# Patient Record
Sex: Female | Born: 1965
Health system: Southern US, Community
[De-identification: ages and names within clinical notes are randomized; demographics above are authoritative.]

## PROBLEM LIST (undated history)

## (undated) ENCOUNTER — Ambulatory Visit: Source: Home / Self Care

## (undated) DIAGNOSIS — R7303 Prediabetes: Secondary | ICD-10-CM

## (undated) DIAGNOSIS — E7800 Pure hypercholesterolemia, unspecified: Secondary | ICD-10-CM

## (undated) DIAGNOSIS — E119 Type 2 diabetes mellitus without complications: Secondary | ICD-10-CM

## (undated) DIAGNOSIS — G8929 Other chronic pain: Secondary | ICD-10-CM

## (undated) HISTORY — PX: ABDOMINAL HYSTERECTOMY: SHX81

## (undated) HISTORY — PX: BACK SURGERY: SHX140

---

## 2014-08-29 ENCOUNTER — Emergency Department (HOSPITAL_COMMUNITY)
Admission: EM | Admit: 2014-08-29 | Discharge: 2014-08-29 | Disposition: A | Discharge status: Home / Self Care | Payer: Medicaid Other | Attending: Emergency Medicine | Admitting: Emergency Medicine

## 2014-08-29 ENCOUNTER — Emergency Department (HOSPITAL_COMMUNITY): Payer: Medicaid Other

## 2014-08-29 ENCOUNTER — Encounter (HOSPITAL_COMMUNITY): Payer: Self-pay | Admitting: *Deleted

## 2014-08-29 DIAGNOSIS — Z79899 Other long term (current) drug therapy: Secondary | ICD-10-CM | POA: Diagnosis not present

## 2014-08-29 DIAGNOSIS — R0789 Other chest pain: Secondary | ICD-10-CM | POA: Insufficient documentation

## 2014-08-29 DIAGNOSIS — M542 Cervicalgia: Secondary | ICD-10-CM | POA: Diagnosis not present

## 2014-08-29 DIAGNOSIS — Z3202 Encounter for pregnancy test, result negative: Secondary | ICD-10-CM | POA: Diagnosis not present

## 2014-08-29 DIAGNOSIS — R519 Headache, unspecified: Secondary | ICD-10-CM

## 2014-08-29 DIAGNOSIS — M25512 Pain in left shoulder: Secondary | ICD-10-CM | POA: Insufficient documentation

## 2014-08-29 DIAGNOSIS — Z8639 Personal history of other endocrine, nutritional and metabolic disease: Secondary | ICD-10-CM | POA: Diagnosis not present

## 2014-08-29 DIAGNOSIS — R51 Headache: Secondary | ICD-10-CM | POA: Insufficient documentation

## 2014-08-29 DIAGNOSIS — R079 Chest pain, unspecified: Secondary | ICD-10-CM | POA: Diagnosis present

## 2014-08-29 HISTORY — DX: Prediabetes: R73.03

## 2014-08-29 LAB — CBC
HEMATOCRIT: 43.5 % (ref 36.0–46.0)
Hemoglobin: 13.9 g/dL (ref 12.0–15.0)
MCH: 29.9 pg (ref 26.0–34.0)
MCHC: 32 g/dL (ref 30.0–36.0)
MCV: 93.5 fL (ref 78.0–100.0)
Platelets: 277 10*3/uL (ref 150–400)
RBC: 4.65 MIL/uL (ref 3.87–5.11)
RDW: 12.1 % (ref 11.5–15.5)
WBC: 7.2 10*3/uL (ref 4.0–10.5)

## 2014-08-29 LAB — BASIC METABOLIC PANEL
Anion gap: 14 (ref 5–15)
BUN: 6 mg/dL (ref 6–23)
CALCIUM: 9.7 mg/dL (ref 8.4–10.5)
CHLORIDE: 103 mmol/L (ref 96–112)
CO2: 20 mmol/L (ref 19–32)
Creatinine, Ser: 0.74 mg/dL (ref 0.50–1.10)
Glucose, Bld: 162 mg/dL — ABNORMAL HIGH (ref 70–99)
Potassium: 3.7 mmol/L (ref 3.5–5.1)
Sodium: 137 mmol/L (ref 135–145)

## 2014-08-29 LAB — POC URINE PREG, ED: PREG TEST UR: NEGATIVE

## 2014-08-29 LAB — I-STAT TROPONIN, ED: TROPONIN I, POC: 0 ng/mL (ref 0.00–0.08)

## 2014-08-29 MED ORDER — DIAZEPAM 5 MG PO TABS
5.0000 mg | ORAL_TABLET | Freq: Once | ORAL | Status: AC
  Administered 2014-08-29: 5 mg via ORAL
  Filled 2014-08-29: qty 1

## 2014-08-29 MED ORDER — DIAZEPAM 5 MG PO TABS: 5.0000 mg | ORAL_TABLET | Freq: Four times a day (QID) | ORAL | Status: DC | PRN

## 2014-08-29 MED ORDER — IBUPROFEN 600 MG PO TABS: 600.0000 mg | ORAL_TABLET | Freq: Four times a day (QID) | ORAL | Status: DC | PRN

## 2014-08-29 NOTE — ED Notes (Signed)
Pt c/o migraine that started 3-4 hours ago. States she recently started experiencing chest pain that is radiating to her back, shoulder and neck. Has not taken anything for the pain.

## 2014-08-29 NOTE — Discharge Instructions (Signed)
Cervical Sprain °A cervical sprain is an injury in the neck in which the strong, fibrous tissues (ligaments) that connect your neck bones stretch or tear. Cervical sprains can range from mild to severe. Severe cervical sprains can cause the neck vertebrae to be unstable. This can lead to damage of the spinal cord and can result in serious nervous system problems. The amount of time it takes for a cervical sprain to get better depends on the cause and extent of the injury. Most cervical sprains heal in 1 to 3 weeks. °CAUSES  °Severe cervical sprains may be caused by:  °· Contact sport injuries (such as from football, rugby, wrestling, hockey, auto racing, gymnastics, diving, martial arts, or boxing).   °· Motor vehicle collisions.   °· Whiplash injuries. This is an injury from a sudden forward and backward whipping movement of the head and neck.  °· Falls.   °Mild cervical sprains may be caused by:  °· Being in an awkward position, such as while cradling a telephone between your ear and shoulder.   °· Sitting in a chair that does not offer proper support.   °· Working at a poorly designed computer station.   °· Looking up or down for long periods of time.   °SYMPTOMS  °· Pain, soreness, stiffness, or a burning sensation in the front, back, or sides of the neck. This discomfort may develop immediately after the injury or slowly, 24 hours or more after the injury.   °· Pain or tenderness directly in the middle of the back of the neck.   °· Shoulder or upper back pain.   °· Limited ability to move the neck.   °· Headache.   °· Dizziness.   °· Weakness, numbness, or tingling in the hands or arms.   °· Muscle spasms.   °· Difficulty swallowing or chewing.   °· Tenderness and swelling of the neck.   °DIAGNOSIS  °Most of the time your health care provider can diagnose a cervical sprain by taking your history and doing a physical exam. Your health care provider will ask about previous neck injuries and any known neck  problems, such as arthritis in the neck. X-rays may be taken to find out if there are any other problems, such as with the bones of the neck. Other tests, such as a CT scan or MRI, may also be needed.  °TREATMENT  °Treatment depends on the severity of the cervical sprain. Mild sprains can be treated with rest, keeping the neck in place (immobilization), and pain medicines. Severe cervical sprains are immediately immobilized. Further treatment is done to help with pain, muscle spasms, and other symptoms and may include: °· Medicines, such as pain relievers, numbing medicines, or muscle relaxants.   °· Physical therapy. This may involve stretching exercises, strengthening exercises, and posture training. Exercises and improved posture can help stabilize the neck, strengthen muscles, and help stop symptoms from returning.   °HOME CARE INSTRUCTIONS  °· Put ice on the injured area.   °¨ Put ice in a plastic bag.   °¨ Place a towel between your skin and the bag.   °¨ Leave the ice on for 15-20 minutes, 3-4 times a day.   °· If your injury was severe, you may have been given a cervical collar to wear. A cervical collar is a two-piece collar designed to keep your neck from moving while it heals. °¨ Do not remove the collar unless instructed by your health care provider. °¨ If you have long hair, keep it outside of the collar. °¨ Ask your health care provider before making any adjustments to your collar. Minor   adjustments may be required over time to improve comfort and reduce pressure on your chin or on the back of your head. °¨ If you are allowed to remove the collar for cleaning or bathing, follow your health care provider's instructions on how to do so safely. °¨ Keep your collar clean by wiping it with mild soap and water and drying it completely. If the collar you have been given includes removable pads, remove them every 1-2 days and hand wash them with soap and water. Allow them to air dry. They should be completely  dry before you wear them in the collar. °¨ If you are allowed to remove the collar for cleaning and bathing, wash and dry the skin of your neck. Check your skin for irritation or sores. If you see any, tell your health care provider. °¨ Do not drive while wearing the collar.   °· Only take over-the-counter or prescription medicines for pain, discomfort, or fever as directed by your health care provider.   °· Keep all follow-up appointments as directed by your health care provider.   °· Keep all physical therapy appointments as directed by your health care provider.   °· Make any needed adjustments to your workstation to promote good posture.   °· Avoid positions and activities that make your symptoms worse.   °· Warm up and stretch before being active to help prevent problems.   °SEEK MEDICAL CARE IF:  °· Your pain is not controlled with medicine.   °· You are unable to decrease your pain medicine over time as planned.   °· Your activity level is not improving as expected.   °SEEK IMMEDIATE MEDICAL CARE IF:  °· You develop any bleeding. °· You develop stomach upset. °· You have signs of an allergic reaction to your medicine.   °· Your symptoms get worse.   °· You develop new, unexplained symptoms.   °· You have numbness, tingling, weakness, or paralysis in any part of your body.   °MAKE SURE YOU:  °· Understand these instructions. °· Will watch your condition. °· Will get help right away if you are not doing well or get worse. °Document Released: 03/15/2007 Document Revised: 05/23/2013 Document Reviewed: 11/23/2012 °Exit[employer]® Patient Information ©2015 [employer]. This information is not intended to replace advice given to you by your health care provider. Make sure you discuss any questions you have with your health care provider. ° °Chest Wall Pain °Chest wall pain is pain in or around the bones and muscles of your chest. It may take up to 6 weeks to get better. It may take longer if you must stay physically  active in your work and activities.  °CAUSES  °Chest wall pain may happen on its own. However, it may be caused by: °· A viral illness like the flu. °· Injury. °· Coughing. °· Exercise. °· Arthritis. °· Fibromyalgia. °· Shingles. °HOME CARE INSTRUCTIONS  °· Avoid overtiring physical activity. Try not to strain or perform activities that cause pain. This includes any activities using your chest or your abdominal and side muscles, especially if heavy weights are used. °· Put ice on the sore area. °¨ Put ice in a plastic bag. °¨ Place a towel between your skin and the bag. °¨ Leave the ice on for 15-20 minutes per hour while awake for the first 2 days. °· Only take over-the-counter or prescription medicines for pain, discomfort, or fever as directed by your caregiver. °SEEK IMMEDIATE MEDICAL CARE IF:  °· Your pain increases, or you are very uncomfortable. °· You have a fever. °· Your chest   pain becomes worse.  You have new, unexplained symptoms.  You have nausea or vomiting.  You feel sweaty or lightheaded.  You have a cough with phlegm (sputum), or you cough up blood. MAKE SURE YOU:   Understand these instructions.  Will watch your condition.  Will get help right away if you are not doing well or get worse. Document Released: 05/18/2005 Document Revised: 08/10/2011 Document Reviewed: 01/12/2011 [facility] Patient Information 2015 [place], [place]. This information is not intended to replace advice given to you by your health care provider. Make sure you discuss any questions you have with your health care provider.

## 2014-08-30 NOTE — ED Provider Notes (Signed)
CSN: [id number]     Arrival date & time 08/29/14  1945 History   First MD Initiated Contact with Patient 08/29/14 2017     Chief Complaint  Patient presents with  . Migraine  . Chest Pain     (Consider location/radiation/quality/duration/timing/severity/associated sxs/prior Treatment) Patient is a 49 y.o. female presenting with migraines and chest pain. The history is provided by the patient.  Migraine Associated symptoms include chest pain and headaches. Pertinent negatives include no abdominal pain.  Chest Pain Associated symptoms: headache and numbness   Associated symptoms: no abdominal pain, no back pain, no nausea, not vomiting and no weakness    patient presents with headache from her left eye back to her neck. Goes for neck down to her left arm with some numbness in her left hand. Also has some pain on her left chest going through to the back. Does have history of some headaches with her bulging disc in her neck. This is a typical headache for her. The pain on the neck is worse with movement and palpation of her shoulder. She states a specific spot she can push. No difficulty breathing. No recent trauma. No fevers. No recent travel. She does not smoke.  Past Medical History  Diagnosis Date  . Borderline diabetic    Past Surgical History  Procedure Laterality Date  . Abdominal hysterectomy     No family history on file. History  Substance Use Topics  . Smoking status: Never Smoker   . Smokeless tobacco: Never Used  . Alcohol Use: No   OB History    No data available     Review of Systems  Constitutional: Negative for activity change and appetite change.  Eyes: Negative for pain.  Respiratory: Negative for chest tightness.   Cardiovascular: Positive for chest pain. Negative for leg swelling.  Gastrointestinal: Negative for nausea, vomiting, abdominal pain and diarrhea.  Genitourinary: Negative for flank pain.  Musculoskeletal: Positive for neck pain. Negative for  back pain and neck stiffness.  Skin: Negative for rash.  Neurological: Positive for numbness and headaches. Negative for weakness.  Psychiatric/Behavioral: Negative for behavioral problems.      Allergies  Review of patient's allergies indicates no known allergies.  Home Medications   Prior to Admission medications   Medication Sig Start Date End Date Taking? Authorizing Provider  metFORMIN (GLUCOPHAGE) 500 MG tablet Take 500 mg by mouth 2 (two) times daily with a meal.   Yes Historical Provider, MD  diazepam (VALIUM) 5 MG tablet Take 1 tablet (5 mg total) by mouth every 6 (six) hours as needed for muscle spasms. 08/29/14   Benjiman Core, MD  ibuprofen (ADVIL,MOTRIN) 600 MG tablet Take 1 tablet (600 mg total) by mouth every 6 (six) hours as needed. 08/29/14   Benjiman Core, MD   BP 142/71 mmHg  Pulse 75  Temp(Src) 97.7 F (36.5 C) (Oral)  Resp 22  Ht  (1.6 m)  Wt 160 lb (72.576 kg)  BMI 28.35 kg/m2  SpO2 99% Physical Exam  Constitutional: She is oriented to person, place, and time. She appears well-developed and well-nourished.  HENT:  Head: Normocephalic and atraumatic.  Eyes: EOM are normal. Pupils are equal, round, and reactive to light.  Neck: Normal range of motion. Neck supple.  Tenderness over trapezius on left side. Palpation of trapezius shoots pain down her left arm.  Cardiovascular: Normal rate, regular rhythm and normal heart sounds.   No murmur heard. Pulmonary/Chest: Effort normal and breath sounds normal. No respiratory  distress. She has no wheezes. She has no rales. She exhibits tenderness.  Abdominal: Soft. Bowel sounds are normal. She exhibits no distension. There is no tenderness. There is no rebound and no guarding.  Musculoskeletal: Normal range of motion.  Tenderness over left trapezius.  Neurological: She is alert and oriented to person, place, and time. No cranial nerve deficit.  Neuromuscular intact over left hand over radial median and  ulnar distribution.  Skin: Skin is warm and dry.  Psychiatric: She has a normal mood and affect. Her speech is normal.  Nursing note and vitals reviewed.   ED Course  Procedures (including critical care time) Labs Review Labs Reviewed  BASIC METABOLIC PANEL - Abnormal; Notable for the following:    Glucose, Bld 162 (*)    All other components within normal limits  CBC  I-STAT TROPOININ, ED  POC URINE PREG, ED    Imaging Review Dg Chest 1 View  08/29/2014   CLINICAL DATA:  Left facial pain and arm pain last 2 hours.  EXAM: CHEST  1 VIEW  COMPARISON:  None.  FINDINGS: Midline trachea.  Normal heart size and mediastinal contours.  Sharp costophrenic angles.  No pneumothorax.  Clear lungs.  IMPRESSION: No active disease.   Electronically Signed   By: Jeronimo GreavesKyle  Talbot M.D.   On: 08/29/2014 20:19     EKG Interpretation   Date/Time:  Wednesday August 29 2014 19:51:54 EDT Ventricular Rate:  86 PR Interval:  148 QRS Duration: 78 QT Interval:  336 QTC Calculation: 402 R Axis:   61 Text Interpretation:  Normal sinus rhythm Normal ECG Confirmed by  Rubin PayorPICKERING  MD, Harrold DonathNATHAN [phone]([id number]) on 08/29/2014 8:19:27 PM      MDM   Final diagnoses:  Nonintractable headache, unspecified chronicity pattern, unspecified headache type  Neck pain  Chest wall pain     patient with pain in her head neck chest and arm. Likely musculoskeletal from the neck. Has known bulging disks. Is not appear to be cardiac cause. Will discharge home.    Benjiman CoreNathan Rayola Everhart, MD 08/30/14 [phone]

## 2014-09-11 ENCOUNTER — Ambulatory Visit: Payer: Medicaid Other | Attending: Internal Medicine | Admitting: Internal Medicine

## 2014-09-11 ENCOUNTER — Encounter: Payer: Self-pay | Admitting: Internal Medicine

## 2014-09-11 VITALS — BP 133/85 | HR 74 | Temp 98.0°F | Resp 16 | Wt 177.0 lb

## 2014-09-11 DIAGNOSIS — M542 Cervicalgia: Secondary | ICD-10-CM | POA: Diagnosis not present

## 2014-09-11 DIAGNOSIS — M5416 Radiculopathy, lumbar region: Secondary | ICD-10-CM

## 2014-09-11 DIAGNOSIS — G8929 Other chronic pain: Secondary | ICD-10-CM

## 2014-09-11 DIAGNOSIS — M545 Low back pain, unspecified: Secondary | ICD-10-CM

## 2014-09-11 DIAGNOSIS — M5010 Cervical disc disorder with radiculopathy, unspecified cervical region: Secondary | ICD-10-CM | POA: Diagnosis not present

## 2014-09-11 DIAGNOSIS — R7309 Other abnormal glucose: Secondary | ICD-10-CM

## 2014-09-11 DIAGNOSIS — R7303 Prediabetes: Secondary | ICD-10-CM

## 2014-09-11 LAB — GLUCOSE, POCT (MANUAL RESULT ENTRY): POC GLUCOSE: 164 mg/dL — AB (ref 70–99)

## 2014-09-11 LAB — POCT GLYCOSYLATED HEMOGLOBIN (HGB A1C): Hemoglobin A1C: 6.4

## 2014-09-11 MED ORDER — GABAPENTIN 300 MG PO CAPS: 300.0000 mg | ORAL_CAPSULE | Freq: Three times a day (TID) | ORAL | Status: DC

## 2014-09-11 NOTE — Patient Instructions (Signed)
DASH Eating Plan °DASH stands for "Dietary Approaches to Stop Hypertension." The DASH eating plan is a healthy eating plan that has been shown to reduce high blood pressure (hypertension). Additional health benefits may include reducing the risk of type 2 diabetes mellitus, heart disease, and stroke. The DASH eating plan may also help with weight loss. °WHAT DO I NEED TO KNOW ABOUT THE DASH EATING PLAN? °For the DASH eating plan, you will follow these general guidelines: °· Choose foods with a percent daily value for sodium of less than 5% (as listed on the food label). °· Use salt-free seasonings or herbs instead of table salt or sea salt. °· Check with your health care provider or pharmacist before using salt substitutes. °· Eat lower-sodium products, often labeled as "lower sodium" or "no salt added." °· Eat fresh foods. °· Eat more vegetables, fruits, and low-fat dairy products. °· Choose whole grains. Look for the word "whole" as the first word in the ingredient list. °· Choose fish and skinless chicken or turkey more often than red meat. Limit fish, poultry, and meat to 6 oz (170 g) each day. °· Limit sweets, desserts, sugars, and sugary drinks. °· Choose heart-healthy fats. °· Limit cheese to 1 oz (28 g) per day. °· Eat more home-cooked food and less restaurant, buffet, and fast food. °· Limit fried foods. °· Cook foods using methods other than frying. °· Limit canned vegetables. If you do use them, rinse them well to decrease the sodium. °· When eating at a restaurant, ask that your food be prepared with less salt, or no salt if possible. °WHAT FOODS CAN I EAT? °Seek help from a dietitian for individual calorie needs. °Grains °Whole grain or whole wheat bread. Brown rice. Whole grain or whole wheat pasta. Quinoa, bulgur, and whole grain cereals. Low-sodium cereals. Corn or whole wheat flour tortillas. Whole grain cornbread. Whole grain crackers. Low-sodium crackers. °Vegetables °Fresh or frozen vegetables  (raw, steamed, roasted, or grilled). Low-sodium or reduced-sodium tomato and vegetable juices. Low-sodium or reduced-sodium tomato sauce and paste. Low-sodium or reduced-sodium canned vegetables.  °Fruits °All fresh, canned (in natural juice), or frozen fruits. °Meat and Other Protein Products °Ground beef (85% or leaner), grass-fed beef, or beef trimmed of fat. Skinless chicken or turkey. Ground chicken or turkey. Pork trimmed of fat. All fish and seafood. Eggs. Dried beans, peas, or lentils. Unsalted nuts and seeds. Unsalted canned beans. °Dairy °Low-fat dairy products, such as skim or 1% milk, 2% or reduced-fat cheeses, low-fat ricotta or cottage cheese, or plain low-fat yogurt. Low-sodium or reduced-sodium cheeses. °Fats and Oils °Tub margarines without trans fats. Light or reduced-fat mayonnaise and salad dressings (reduced sodium). Avocado. Safflower, olive, or canola oils. Natural peanut or almond butter. °Other °Unsalted popcorn and pretzels. °The items listed above may not be a complete list of recommended foods or beverages. Contact your dietitian for more options. °WHAT FOODS ARE NOT RECOMMENDED? °Grains °White bread. White pasta. White rice. Refined cornbread. Bagels and croissants. Crackers that contain trans fat. °Vegetables °Creamed or fried vegetables. Vegetables in a cheese sauce. Regular canned vegetables. Regular canned tomato sauce and paste. Regular tomato and vegetable juices. °Fruits °Dried fruits. Canned fruit in light or heavy syrup. Fruit juice. °Meat and Other Protein Products °Fatty cuts of meat. Ribs, chicken wings, bacon, sausage, bologna, salami, chitterlings, fatback, hot dogs, bratwurst, and packaged luncheon meats. Salted nuts and seeds. Canned beans with salt. °Dairy °Whole or 2% milk, cream, half-and-half, and cream cheese. Whole-fat or sweetened yogurt. Full-fat   cheeses or blue cheese. Nondairy creamers and whipped toppings. Processed cheese, cheese spreads, or cheese  curds. °Condiments °Onion and garlic salt, seasoned salt, table salt, and sea salt. Canned and packaged gravies. Worcestershire sauce. Tartar sauce. Barbecue sauce. Teriyaki sauce. Soy sauce, including reduced sodium. Steak sauce. Fish sauce. Oyster sauce. Cocktail sauce. Horseradish. Ketchup and mustard. Meat flavorings and tenderizers. Bouillon cubes. Hot sauce. Tabasco sauce. Marinades. Taco seasonings. Relishes. °Fats and Oils °Butter, stick margarine, lard, shortening, ghee, and bacon fat. Coconut, palm kernel, or palm oils. Regular salad dressings. °Other °Pickles and olives. Salted popcorn and pretzels. °The items listed above may not be a complete list of foods and beverages to avoid. Contact your dietitian for more information. °WHERE CAN I FIND MORE INFORMATION? °[employer], [employer]: www.nhlbi.nih.gov/health/health-topics/topics/dash/ °Document Released: 05/07/2011 Document Revised: 10/02/2013 Document Reviewed: 03/22/2013 °[employer]® Patient Information ©2015 [employer]. This information is not intended to replace advice given to you by your health care provider. Make sure you discuss any questions you have with your health care provider. ° °
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
## 2014-09-28 ENCOUNTER — Encounter: Payer: Self-pay | Admitting: Family Medicine

## 2014-09-28 ENCOUNTER — Ambulatory Visit: Payer: Medicaid Other | Attending: Internal Medicine | Admitting: Family Medicine

## 2014-09-28 VITALS — BP 131/82 | HR 80 | Temp 98.5°F | Resp 18 | Ht 62.0 in | Wt 178.2 lb

## 2014-09-28 DIAGNOSIS — J329 Chronic sinusitis, unspecified: Secondary | ICD-10-CM | POA: Insufficient documentation

## 2014-09-28 DIAGNOSIS — J0100 Acute maxillary sinusitis, unspecified: Secondary | ICD-10-CM

## 2014-09-28 MED ORDER — AMOXICILLIN 500 MG PO CAPS: 500.0000 mg | ORAL_CAPSULE | Freq: Three times a day (TID) | ORAL | Status: DC

## 2014-09-28 NOTE — Patient Instructions (Signed)
Over the counter medications as needed for symptoms Warm salt water gargles for sore throat. Tylenol or ipuprofen for aches and fever. Follow-up as needed if not improving in 7-10 days.

## 2014-09-28 NOTE — Progress Notes (Signed)
Subjective:     Patient ID: Sharen HonesFlora Castillo, female   DOB: [birth year], 49 y.o.   MRN: [id number]  HPI   Patient with a history of bacterial sinusitis, treated with antibiotics, presents today with a 4 day history of nasal and sinus congestion, facial pain and headache, sorethroat, and cough.   Review of Systems   See  See HPI     Objective:   Physical Exam   Alert, oriented, appropriate, no acute distress. TMS are clear, nasal congestion is present. There is facial tenderness present over the maxillary sinusits, greater on the right. Throat shows erythema and cobblestoning on the posterior pharyxn wall. Neck is supple FROM w/o adenopathy or tenderness. Lungs are clear to auscultation. HS are regualr w/o m,g,r    Assessment:     Sinusitis    Plan:     Amoxicillin 500 mg, #30, one po tid. OTC symptomatic measures as desired Follow-up if no improving in 7-10 days.  Henrietta HooverLinda C. Bernhardt, FNP-BC

## 2014-09-28 NOTE — Progress Notes (Signed)
Patient presents with complaints of sore throat, facial sinus pain. She indicates her symptoms started about 4 days ago with runny nose and facial sinus pressure. Two days ago she developed a sore throat; sore throat still remains at this time. Pain 6/10. Taking ibuprofen as needed for pain. She indicates yesterday she had chills and a fever last night-"102" per patient.  Patient afebrile today.

## 2014-11-06 ENCOUNTER — Ambulatory Visit: Payer: Medicaid Other | Attending: Neurosurgery | Admitting: Physical Therapy

## 2014-11-06 DIAGNOSIS — M2569 Stiffness of other specified joint, not elsewhere classified: Secondary | ICD-10-CM

## 2014-11-06 DIAGNOSIS — M2560 Stiffness of unspecified joint, not elsewhere classified: Secondary | ICD-10-CM

## 2014-11-06 DIAGNOSIS — M545 Low back pain: Secondary | ICD-10-CM | POA: Insufficient documentation

## 2014-11-06 DIAGNOSIS — M542 Cervicalgia: Secondary | ICD-10-CM | POA: Diagnosis not present

## 2014-11-06 DIAGNOSIS — R29898 Other symptoms and signs involving the musculoskeletal system: Secondary | ICD-10-CM

## 2014-11-06 DIAGNOSIS — M62830 Muscle spasm of back: Secondary | ICD-10-CM | POA: Diagnosis not present

## 2014-11-06 NOTE — Patient Instructions (Signed)
   Emilene Roma PT, DPT, LAT, ATC  [facility] Phone: [phone]     

## 2014-11-06 NOTE — Therapy (Signed)
[facility] [address] [place], [place], [address] Phone: [phone]   Fax:  [phone]  Physical Therapy Evaluation  Patient Details  Name: Dodie Parisi MRN: [id number] Date of Birth: 01-Feb-1966 Referring Provider:  Coletta Memos, MD  Encounter Date: 11/06/2014      PT End of Session - 11/06/14 1545    Visit Number 1   Number of Visits 1   Date for PT Re-Evaluation 11/07/14   Authorization Type Medicaid   PT Start Time 1500   PT Stop Time 1545   PT Time Calculation (min) 45 min   Activity Tolerance Patient tolerated treatment well   Behavior During Therapy [facility] for tasks assessed/performed      Past Medical History  Diagnosis Date  . Borderline diabetic     Past Surgical History  Procedure Laterality Date  . Abdominal hysterectomy      There were no vitals filed for this visit.  Visit Diagnosis:  Left low back pain, with sciatica presence unspecified - Plan: PT plan of care cert/re-cert  Decreased ROM of trunk and back - Plan: PT plan of care cert/re-cert  Weakness of both legs - Plan: PT plan of care cert/re-cert  Neck pain - Plan: PT plan of care cert/re-cert  Muscle spasm of back - Plan: PT plan of care cert/re-cert  Weakness of both arms - Plan: PT plan of care cert/re-cert      Subjective Assessment - 11/06/14 1503    Subjective pt is a 49 y.o F with CC of neck and low back pain. She reports the neck and back bothering her for over 5 years. off and on with it getting worse since a January. with referral to left knee   Limitations Lifting;Standing;Walking;House hold activities;Sitting   How long can you sit comfortably? 20-30 min   How long can you stand comfortably? 20 -30 min   How long can you walk comfortably? 20-30 min   Diagnostic tests 3 weeks ago, 3 herniated disc in neck and complete disc degeneration on the low back   Patient Stated Goals to be pain free   Currently in Pain? Yes   Pain  Score 7    Pain Location Neck   Pain Orientation Mid;Left   Pain Descriptors / Indicators Aching;Shooting;Spasm;Tightness   Pain Type Chronic pain   Pain Radiating Towards to L shoulder   Pain Onset More than a month ago   Pain Frequency Constant   Aggravating Factors  turning the head, laying down on elevated surface, reaching   Pain Relieving Factors ibuprofen, and prescription medication, ice   Multiple Pain Sites Yes   Pain Score 9   Pain Location Back   Pain Orientation Mid;Left   Pain Descriptors / Indicators Aching;Spasm;Tightness   Pain Type Chronic pain   Pain Radiating Towards left knee   Pain Onset More than a month ago   Pain Frequency Constant   Aggravating Factors  walking, standing,    Pain Relieving Factors laying down,             [facility] PT Assessment - 11/06/14 1508    Assessment   Medical Diagnosis neck and low back pain   Onset Date/Surgical Date --  5 years ago   Hand Dominance Right   Next MD Visit PRN   Prior Therapy yes   Precautions   Precautions None   Restrictions   Weight Bearing Restrictions No   Balance Screen   Has the patient fallen in the  past 6 months No   Has the patient had a decrease in activity level because of a fear of falling?  No   Is the patient reluctant to leave their home because of a fear of falling?  No   Home Environment   Living Environment Private residence   Living Arrangements Spouse/significant other;Children   Available Help at Discharge Available 24 hours/day;Available PRN/intermittently   Type of Home House   Home Access Stairs to enter   Entrance Stairs-Number of Steps 3   Entrance Stairs-Rails Can reach both   Home Layout One level   Prior Function   Level of Independence Independent;Independent with basic ADLs;Independent with household mobility without device;Independent with community mobility without device;Independent with gait;Independent with transfers   Vocation On disability  trying to get it  approved   Leisure riding bikes, playing baseball, roller skate.    Cognition   Overall Cognitive Status Within Functional Limits for tasks assessed   ROM / Strength   AROM / PROM / Strength AROM;Strength   AROM   AROM Assessment Site Cervical;Lumbar   Cervical Flexion 41   Cervical Extension 30   Cervical - Right Side Bend 30   Cervical - Left Side Bend 22   Cervical - Right Rotation 50  tightness throughout motion   Cervical - Left Rotation 30  pain during motion   Lumbar Flexion 48  pain at endrange   Lumbar Extension 10  pain during motion   Lumbar - Right Side Bend 20  pain during movement   Lumbar - Left Side Bend 20   during movement   Lumbar - Right Rotation 35%   Lumbar - Left Rotation 25%   Strength   Overall Strength Comments pain during all muscle testing   Strength Assessment Site Shoulder;Hip;Knee;Hand   Right/Left Shoulder Left;Right   Right Shoulder Flexion 4-/5   Right Shoulder Extension 4-/5   Right Shoulder ABduction 4-/5   Right Shoulder Internal Rotation 4-/5   Right Shoulder External Rotation 4-/5   Left Shoulder Flexion 4-/5   Left Shoulder Extension 4-/5   Left Shoulder ABduction 4-/5   Left Shoulder Internal Rotation 4-/5   Left Shoulder External Rotation 4-/5   Right/Left hand Right;Left   Right Hand Grip (lbs) 34   Left Hand Grip (lbs) 40   Right/Left Hip Right;Left   Right Hip Flexion 4/5   Right Hip Extension 4/5   Right Hip ABduction 4/5   Right Hip ADduction 4/5   Left Hip Flexion 4/5   Left Hip Extension 4/5   Left Hip ABduction 4/5   Left Hip ADduction 4/5   Right/Left Knee Left;Right   Right Knee Flexion 4-/5   Right Knee Extension 4+/5   Left Knee Flexion 4-/5   Left Knee Extension 4+/5   Special Tests    Special Tests Cervical;Lumbar   Cervical Tests other;Dictraction;Spurling's   Spurling's   Findings Positive   Distraction Test   Findngs Positive   other    Findings Positive   Side --  bil L>R   Comment ULTT    Slump test   Findings Positive   Prone Knee Bend Test   Findings Negative   Straight Leg Raise   Findings Negative                           PT Education - 11/06/14 1545    Education provided Yes   Education Details evaluaiton findings, HEP, anatomical  explanation   Person(s) Educated Patient   Methods Explanation   Comprehension Verbalized understanding                    Plan - 11/06/14 1546    Clinical Impression Statement [name] presents to OPPT with CC of neck and low back pain that started over 5 years ago. she demonstrated limited cervical and lumbar mobility with pain in all planes. Upon MMT she demonstrates weakness of bil UEs and LE with pain during testing. she has a postivie special test clust of limited cervial mobility, ULTT (median) spurlings, and distraction indicating postivie finding for cervical radiculopathy.  Low back special testing was poitive for slump, and straight leg raise. PT educated and went over HEP and how to decreased muscle tightness via stretching and trigger point release using direct pressure using a tennis ball or similar item.    PT Frequency 1x / week   PT Next Visit Plan Medicaid eval only 1 visit approved   PT Home Exercise Plan see HEP handout   Consulted and Agree with Plan of Care Patient         Problem List Patient Active Problem List   Diagnosis Date Noted  . Pre-diabetes 09/11/2014  . Chronic neck pain 09/11/2014  . Cervical disc disorder with radiculopathy of cervical region 09/11/2014  . Chronic low back pain 09/11/2014   Lulu RidingKristoffer Jontavious Commons PT, DPT, LAT, ATC  11/06/2014  3:55 PM    [facility] [address] [place], [place], [address] Phone: [phone]   Fax:  [phone]

## 2014-12-17 ENCOUNTER — Ambulatory Visit: Payer: Medicaid Other | Attending: Internal Medicine | Admitting: Internal Medicine

## 2014-12-17 ENCOUNTER — Encounter: Payer: Self-pay | Admitting: Internal Medicine

## 2014-12-17 VITALS — BP 120/70 | HR 86 | Temp 98.0°F | Resp 16 | Wt 181.2 lb

## 2014-12-17 DIAGNOSIS — M545 Low back pain: Secondary | ICD-10-CM | POA: Insufficient documentation

## 2014-12-17 DIAGNOSIS — G8929 Other chronic pain: Secondary | ICD-10-CM | POA: Insufficient documentation

## 2014-12-17 DIAGNOSIS — E119 Type 2 diabetes mellitus without complications: Secondary | ICD-10-CM | POA: Insufficient documentation

## 2014-12-17 DIAGNOSIS — Z79899 Other long term (current) drug therapy: Secondary | ICD-10-CM | POA: Insufficient documentation

## 2014-12-17 LAB — LIPID PANEL
CHOLESTEROL: 197 mg/dL (ref 0–200)
HDL: 46 mg/dL (ref >=46)
LDL Cholesterol: 133 mg/dL — ABNORMAL HIGH (ref 0–99)
Total CHOL/HDL Ratio: 4.3 Ratio
Triglycerides: 91 mg/dL (ref <150)
VLDL: 18 mg/dL (ref 0–40)

## 2014-12-17 LAB — GLUCOSE, POCT (MANUAL RESULT ENTRY): POC Glucose: 119 mg/dl — AB (ref 70–99)

## 2014-12-17 LAB — COMPLETE METABOLIC PANEL WITH GFR
ALT: 52 U/L — AB (ref 0–35)
AST: 37 U/L (ref 0–37)
Albumin: 4 g/dL (ref 3.5–5.2)
Alkaline Phosphatase: 43 U/L (ref 39–117)
BUN: 10 mg/dL (ref 6–23)
CALCIUM: 9.6 mg/dL (ref 8.4–10.5)
CO2: 27 mEq/L (ref 19–32)
CREATININE: 0.66 mg/dL (ref 0.50–1.10)
Chloride: 104 mEq/L (ref 96–112)
GFR, Est African American: >89 mL/min
GFR, Est Non African American: >89 mL/min
Glucose, Bld: 120 mg/dL — ABNORMAL HIGH (ref 70–99)
Potassium: 5 mEq/L (ref 3.5–5.3)
Sodium: 139 mEq/L (ref 135–145)
TOTAL PROTEIN: 7.1 g/dL (ref 6.0–8.3)
Total Bilirubin: 0.7 mg/dL (ref 0.2–1.2)

## 2014-12-17 LAB — POCT GLYCOSYLATED HEMOGLOBIN (HGB A1C): Hemoglobin A1C: 6.6

## 2014-12-17 NOTE — Patient Instructions (Signed)
Diabetes Mellitus and Food It is important for you to manage your blood sugar (glucose) level. Your blood glucose level can be greatly affected by what you eat. Eating healthier foods in the appropriate amounts throughout the day at about the same time each day will help you control your blood glucose level. It can also help slow or prevent worsening of your diabetes mellitus. Healthy eating may even help you improve the level of your blood pressure and reach or maintain a healthy weight.  HOW CAN FOOD AFFECT ME? Carbohydrates Carbohydrates affect your blood glucose level more than any other type of food. Your dietitian will help you determine how many carbohydrates to eat at each meal and teach you how to count carbohydrates. Counting carbohydrates is important to keep your blood glucose at a healthy level, especially if you are using insulin or taking certain medicines for diabetes mellitus. Alcohol Alcohol can cause sudden decreases in blood glucose (hypoglycemia), especially if you use insulin or take certain medicines for diabetes mellitus. Hypoglycemia can be a life-threatening condition. Symptoms of hypoglycemia (sleepiness, dizziness, and disorientation) are similar to symptoms of having too much alcohol.  If your health care provider has given you approval to drink alcohol, do so in moderation and use the following guidelines:  Women should not have more than one drink per day, and men should not have more than two drinks per day. One drink is equal to:  12 oz of beer.  5 oz of wine.  1 oz of hard liquor.  Do not drink on an empty stomach.  Keep yourself hydrated. Have water, diet soda, or unsweetened iced tea.  Regular soda, juice, and other mixers might contain a lot of carbohydrates and should be counted. WHAT FOODS ARE NOT RECOMMENDED? As you make food choices, it is important to remember that all foods are not the same. Some foods have fewer nutrients per serving than other  foods, even though they might have the same number of calories or carbohydrates. It is difficult to get your body what it needs when you eat foods with fewer nutrients. Examples of foods that you should avoid that are high in calories and carbohydrates but low in nutrients include:  Trans fats (most processed foods list trans fats on the Nutrition Facts label).  Regular soda.  Juice.  Candy.  Sweets, such as cake, pie, doughnuts, and cookies.  Fried foods. WHAT FOODS CAN I EAT? Have nutrient-rich foods, which will nourish your body and keep you healthy. The food you should eat also will depend on several factors, including:  The calories you need.  The medicines you take.  Your weight.  Your blood glucose level.  Your blood pressure level.  Your cholesterol level. You also should eat a variety of foods, including:  Protein, such as meat, poultry, fish, tofu, nuts, and seeds (lean animal proteins are best).  Fruits.  Vegetables.  Dairy products, such as milk, cheese, and yogurt (low fat is best).  Breads, grains, pasta, cereal, rice, and beans.  Fats such as olive oil, trans fat-free margarine, canola oil, avocado, and olives. DOES EVERYONE WITH DIABETES MELLITUS HAVE THE SAME MEAL PLAN? Because every person with diabetes mellitus is different, there is not one meal plan that works for everyone. It is very important that you meet with a dietitian who will help you create a meal plan that is just right for you. Document Released: 02/12/2005 Document Revised: 05/23/2013 Document Reviewed: 04/14/2013 Exit[employer] Patient Information 2015 [employer]. This   information is not intended to replace advice given to you by your health care provider. Make sure you discuss any questions you have with your health care provider.  

## 2014-12-17 NOTE — Progress Notes (Signed)
MRN: [id number] Name: Jill Anderson  Sex: female Age: 49 y.o. DOB: 1966/02/08  Allergies: Review of patient's allergies indicates no known allergies.  Chief Complaint  Patient presents with  . Follow-up    HPI: Patient is 49 y.o. female who has history of chronic neck pain low back pain currently following up with physical medicine rehabilitation, and is taking Neurontin, ibuprofen when necessary for pain, she also has history of prediabetes the last hemoglobin A1c of 6.4%, patient already on metformin her hemoglobin A1c has trended up to 6.6% hence she is diabetic,  As per patient she has been taking metformin twice a day and has not been compliant with low carbohydrate diet, earlier her blood pressure was elevated, repeat manual blood pressure is 120/70, denies any headache dizziness chest and shortness of breath.  Past Medical History  Diagnosis Date  . Borderline diabetic     Past Surgical History  Procedure Laterality Date  . Abdominal hysterectomy        Medication List       This list is accurate as of: 12/17/14 10:34 AM.  Always use your most recent med list.               amoxicillin 500 MG capsule  Commonly known as:  AMOXIL  Take 1 capsule (500 mg total) by mouth 3 (three) times daily.     diazepam 5 MG tablet  Commonly known as:  VALIUM  Take 1 tablet (5 mg total) by mouth every 6 (six) hours as needed for muscle spasms.     gabapentin 300 MG capsule  Commonly known as:  NEURONTIN  Take 1 capsule (300 mg total) by mouth 3 (three) times daily.     ibuprofen 600 MG tablet  Commonly known as:  ADVIL,MOTRIN  Take 1 tablet (600 mg total) by mouth every 6 (six) hours as needed.     metFORMIN 500 MG tablet  Commonly known as:  GLUCOPHAGE  Take 500 mg by mouth 2 (two) times daily with a meal.        No orders of the defined types were placed in this encounter.     There is no immunization history on file for this patient.  Family History    Problem Relation Age of Onset  . Diabetes Mother   . Hypertension Mother   . Diabetes Father   . Hypertension Father     History  Substance Use Topics  . Smoking status: Never Smoker   . Smokeless tobacco: Never Used  . Alcohol Use: No    Review of Systems   As noted in HPI  Filed Vitals:   12/17/14 1021  BP: 120/70  Pulse:   Temp:   Resp:     Physical Exam  Physical Exam  Constitutional: No distress.  Eyes: EOM are normal. Pupils are equal, round, and reactive to light.  Cardiovascular: Normal rate and regular rhythm.   Pulmonary/Chest: Breath sounds normal. No respiratory distress. She has no wheezes. She has no rales.  Musculoskeletal: She exhibits no edema.    CBC    Component Value Date/Time   WBC 7.2 08/29/2014 1958   RBC 4.65 08/29/2014 1958   HGB 13.9 08/29/2014 1958   HCT 43.5 08/29/2014 1958   PLT 277 08/29/2014 1958   MCV 93.5 08/29/2014 1958    CMP     Component Value Date/Time   NA 137 08/29/2014 1958   K 3.7 08/29/2014 1958   CL 103 08/29/2014 1958  CO2 20 08/29/2014 1958   GLUCOSE 162* 08/29/2014 1958   BUN 6 08/29/2014 1958   CREATININE 0.74 08/29/2014 1958   CALCIUM 9.7 08/29/2014 1958   GFRNONAA >90 08/29/2014 1958   GFRAA >90 08/29/2014 1958    No results found for: CHOL  Lab Results  Component Value Date/Time   HGBA1C 6.60 12/17/2014 09:40 AM    No results found for: AST  Assessment and Plan  Type 2 diabetes mellitus without complication - Plan:  Results for orders placed or performed in visit on 12/17/14  Glucose (CBG)  Result Value Ref Range   POC Glucose 119.0 (A) 70 - 99 mg/dl  HgB E4V  Result Value Ref Range   Hemoglobin A1C 6.60    I have advised patient for diabetes meal planning, continue with metformin, will repeat HgB A1c in 3 months, COMPLETE METABOLIC PANEL WITH GFR, also check Lipid panel  Chronic low back pain Continue Neurontin, ibuprofen, following up with physical therapy  rehabilitation.   Return in about 3 months (around 03/19/2015), or if symptoms worsen or fail to improve.   This note has been created with Education officer, environmental. Any transcriptional errors are unintentional.    Doris Cheadle, MD

## 2014-12-17 NOTE — Progress Notes (Signed)
Patient here for follow up on her neck and back pain Patient does follow with ortho and neurosurgeon Requesting routine blood work as well

## 2014-12-18 ENCOUNTER — Telehealth: Payer: Self-pay

## 2014-12-18 NOTE — Telephone Encounter (Signed)
Patient is aware of her lab results 

## 2014-12-18 NOTE — Telephone Encounter (Signed)
-----   Message from Doris Cheadleeepak Advani, MD sent at 12/18/2014 11:29 AM EDT ----- Call and let the patient know that her blood work shows borderline elevated liver enzyme, advised patient to avoid any alcohol or Tylenol, will recheck LFTs on the following visit, also her LDL is elevated, advise patient for low fat diet, will hold off any statins for now since she has borderline elevated  liver enzyme.

## 2015-01-26 ENCOUNTER — Emergency Department (HOSPITAL_COMMUNITY)
Admission: EM | Admit: 2015-01-26 | Discharge: 2015-01-26 | Disposition: A | Discharge status: Home / Self Care | Payer: Medicaid Other | Attending: Emergency Medicine | Admitting: Emergency Medicine

## 2015-01-26 ENCOUNTER — Encounter (HOSPITAL_COMMUNITY): Payer: Self-pay | Admitting: Emergency Medicine

## 2015-01-26 DIAGNOSIS — M549 Dorsalgia, unspecified: Secondary | ICD-10-CM | POA: Insufficient documentation

## 2015-01-26 DIAGNOSIS — R51 Headache: Secondary | ICD-10-CM | POA: Insufficient documentation

## 2015-01-26 DIAGNOSIS — M2550 Pain in unspecified joint: Secondary | ICD-10-CM | POA: Diagnosis not present

## 2015-01-26 DIAGNOSIS — M542 Cervicalgia: Secondary | ICD-10-CM | POA: Diagnosis not present

## 2015-01-26 DIAGNOSIS — H5710 Ocular pain, unspecified eye: Secondary | ICD-10-CM | POA: Insufficient documentation

## 2015-01-26 DIAGNOSIS — M791 Myalgia: Secondary | ICD-10-CM | POA: Insufficient documentation

## 2015-01-26 DIAGNOSIS — R5383 Other fatigue: Secondary | ICD-10-CM | POA: Diagnosis not present

## 2015-01-26 DIAGNOSIS — Z79899 Other long term (current) drug therapy: Secondary | ICD-10-CM | POA: Diagnosis not present

## 2015-01-26 LAB — CBC WITH DIFFERENTIAL/PLATELET
BASOS ABS: 0.1 10*3/uL (ref 0.0–0.1)
Basophils Relative: 1 % (ref 0–1)
EOS PCT: 6 % — AB (ref 0–5)
Eosinophils Absolute: 0.4 10*3/uL (ref 0.0–0.7)
HCT: 41.5 % (ref 36.0–46.0)
Hemoglobin: 13.9 g/dL (ref 12.0–15.0)
Lymphocytes Relative: 48 % — ABNORMAL HIGH (ref 12–46)
Lymphs Abs: 3.3 10*3/uL (ref 0.7–4.0)
MCH: 30.6 pg (ref 26.0–34.0)
MCHC: 33.5 g/dL (ref 30.0–36.0)
MCV: 91.4 fL (ref 78.0–100.0)
Monocytes Absolute: 0.6 10*3/uL (ref 0.1–1.0)
Monocytes Relative: 8 % (ref 3–12)
Neutro Abs: 2.5 10*3/uL (ref 1.7–7.7)
Neutrophils Relative %: 37 % — ABNORMAL LOW (ref 43–77)
PLATELETS: 275 10*3/uL (ref 150–400)
RBC: 4.54 MIL/uL (ref 3.87–5.11)
RDW: 11.9 % (ref 11.5–15.5)
WBC: 6.9 10*3/uL (ref 4.0–10.5)

## 2015-01-26 LAB — URINALYSIS W MICROSCOPIC (NOT AT ARMC)
Bilirubin Urine: NEGATIVE
GLUCOSE, UA: NEGATIVE mg/dL
Hgb urine dipstick: NEGATIVE
Ketones, ur: NEGATIVE mg/dL
Leukocytes, UA: NEGATIVE
Nitrite: NEGATIVE
Protein, ur: NEGATIVE mg/dL
Specific Gravity, Urine: 1.012 (ref 1.005–1.030)
UROBILINOGEN UA: 0.2 mg/dL (ref 0.0–1.0)
pH: 5.5 (ref 5.0–8.0)

## 2015-01-26 LAB — BASIC METABOLIC PANEL
ANION GAP: 7 (ref 5–15)
BUN: 9 mg/dL (ref 6–20)
CALCIUM: 9.2 mg/dL (ref 8.9–10.3)
CO2: 25 mmol/L (ref 22–32)
Chloride: 103 mmol/L (ref 101–111)
Creatinine, Ser: 0.6 mg/dL (ref 0.44–1.00)
GLUCOSE: 139 mg/dL — AB (ref 65–99)
Potassium: 4 mmol/L (ref 3.5–5.1)
Sodium: 135 mmol/L (ref 135–145)

## 2015-01-26 LAB — HEPATIC FUNCTION PANEL
ALT: 58 U/L — AB (ref 14–54)
AST: 50 U/L — AB (ref 15–41)
Albumin: 4 g/dL (ref 3.5–5.0)
Alkaline Phosphatase: 60 U/L (ref 38–126)
BILIRUBIN DIRECT: 0.1 mg/dL (ref 0.1–0.5)
BILIRUBIN INDIRECT: 0.8 mg/dL (ref 0.3–0.9)
BILIRUBIN TOTAL: 0.9 mg/dL (ref 0.3–1.2)
Total Protein: 7.1 g/dL (ref 6.5–8.1)

## 2015-01-26 MED ORDER — HYDROCODONE-ACETAMINOPHEN 5-325 MG PO TABS: 1.0000 | ORAL_TABLET | Freq: Three times a day (TID) | ORAL | Status: DC | PRN

## 2015-01-26 MED ORDER — HYDROCODONE-ACETAMINOPHEN 5-325 MG PO TABS
1.0000 | ORAL_TABLET | Freq: Once | ORAL | Status: AC
  Administered 2015-01-26: 1 via ORAL
  Filled 2015-01-26: qty 1

## 2015-01-26 MED ORDER — KETOROLAC TROMETHAMINE 30 MG/ML IJ SOLN
30.0000 mg | Freq: Once | INTRAMUSCULAR | Status: AC
  Administered 2015-01-26: 30 mg via INTRAVENOUS
  Filled 2015-01-26: qty 1

## 2015-01-26 MED ORDER — IBUPROFEN 400 MG PO TABS: 400.0000 mg | ORAL_TABLET | Freq: Three times a day (TID) | ORAL | Status: AC

## 2015-01-26 NOTE — ED Provider Notes (Signed)
CSN: [id number]     Arrival date & time 01/26/15  0321 History   First MD Initiated Contact with Patient 01/26/15 [phone]     Chief Complaint  Patient presents with  . Joint Pain     (Consider location/radiation/quality/duration/timing/severity/associated sxs/prior Treatment) Patient is a 49 y.o. female presenting with general illness.  Illness Location:  All over Quality:  Pain Severity:  Mild Onset quality:  Gradual Duration:  1 week Timing:  Constant Progression:  Worsening Chronicity:  New Context:  After trip to [place] Ineffective treatments:  Ibuprofen Associated symptoms: fatigue, headaches and myalgias   Associated symptoms: no abdominal pain, no chest pain, no congestion, no cough, no diarrhea, no fever, no loss of consciousness, no rash, no rhinorrhea, no shortness of breath, no sore throat and no vomiting     Past Medical History  Diagnosis Date  . Borderline diabetic    Past Surgical History  Procedure Laterality Date  . Abdominal hysterectomy     Family History  Problem Relation Age of Onset  . Diabetes Mother   . Hypertension Mother   . Diabetes Father   . Hypertension Father    Social History  Substance Use Topics  . Smoking status: Never Smoker   . Smokeless tobacco: Never Used  . Alcohol Use: No   OB History    No data available     Review of Systems  Constitutional: Positive for fatigue. Negative for fever.  HENT: Negative for congestion, drooling, rhinorrhea and sore throat.   Eyes: Positive for pain. Negative for photophobia.  Respiratory: Negative for cough and shortness of breath.   Cardiovascular: Negative for chest pain.  Gastrointestinal: Negative for vomiting, abdominal pain and diarrhea.  Endocrine: Negative for polydipsia and polyuria.  Genitourinary: Negative for dysuria.  Musculoskeletal: Positive for myalgias, back pain, arthralgias and neck pain. Negative for neck stiffness.  Skin: Negative for rash.  Neurological: Positive for  headaches. Negative for loss of consciousness.  All other systems reviewed and are negative.     Allergies  Review of patient's allergies indicates no known allergies.  Home Medications   Prior to Admission medications   Medication Sig Start Date End Date Taking? Authorizing Provider  metFORMIN (GLUCOPHAGE) 500 MG tablet Take 500 mg by mouth 2 (two) times daily with a meal.   Yes Historical Provider, MD  amoxicillin (AMOXIL) 500 MG capsule Take 1 capsule (500 mg total) by mouth 3 (three) times daily. Patient not taking: Reported on 01/26/2015 09/28/14   Henrietta Hoover, NP  diazepam (VALIUM) 5 MG tablet Take 1 tablet (5 mg total) by mouth every 6 (six) hours as needed for muscle spasms. Patient not taking: Reported on 01/26/2015 08/29/14   Benjiman Core, MD  gabapentin (NEURONTIN) 300 MG capsule Take 1 capsule (300 mg total) by mouth 3 (three) times daily. Patient not taking: Reported on 01/26/2015 09/11/14   Doris Cheadle, MD  ibuprofen (ADVIL,MOTRIN) 600 MG tablet Take 1 tablet (600 mg total) by mouth every 6 (six) hours as needed. Patient not taking: Reported on 01/26/2015 08/29/14   Benjiman Core, MD   BP 134/78 mmHg  Pulse 74  Temp(Src) 97.7 F (36.5 C) (Oral)  Resp 18  Ht  (1.575 m)  Wt 165 lb (74.844 kg)  BMI 30.17 kg/m2  SpO2 98% Physical Exam  Constitutional: She is oriented to person, place, and time. She appears well-developed and well-nourished.  HENT:  Head: Normocephalic and atraumatic.  Eyes: Conjunctivae and EOM are normal. Right eye exhibits  no discharge. Left eye exhibits no discharge.  Cardiovascular: Normal rate and regular rhythm.   Pulmonary/Chest: Effort normal and breath sounds normal. No respiratory distress.  Abdominal: Soft. She exhibits no distension. There is no tenderness. There is no rebound.  Musculoskeletal: Normal range of motion. She exhibits no edema or tenderness.  Neurological: She is alert and oriented to person, place, and time.   No altered mental status, able to give full seemingly accurate history.  Face is symmetric, EOM's intact, pupils equal and reactive, vision intact, tongue and uvula midline without deviation Upper and Lower extremity motor 5/5, intact pain perception in distal extremities, 2+ reflexes in biceps, patella and achilles tendons. Finger to nose normal, heel to shin normal. Walks slowlyh without assistance or evident ataxia.   Skin: Skin is warm and dry.  Nursing note and vitals reviewed.   ED Course  Procedures (including critical care time) Labs Review Labs Reviewed  CBC WITH DIFFERENTIAL/PLATELET - Abnormal; Notable for the following:    Neutrophils Relative % 37 (*)    Lymphocytes Relative 48 (*)    Eosinophils Relative 6 (*)    All other components within normal limits  BASIC METABOLIC PANEL - Abnormal; Notable for the following:    Glucose, Bld 139 (*)    All other components within normal limits    Imaging Review No results found. I have personally reviewed and evaluated these images and lab results as part of my medical decision-making.   EKG Interpretation None      MDM   Final diagnoses:  Joint pain   Flu Like illness after travel to [place]. Appears well, vital signs normal, screening labs normal aside from LFT elevation which is normal for her. It was a blade without difficulty and no nausea or vomiting in the emergency department. Multiple possible sources for her symptoms to include dengue fever, yellow fever, chickungunya, or Zika virus. Could also be Normal flulike illness as well. Did not have a rash or fever. Doubt RMSF as a cause. Will DC with supportive care and have her follow-up with her primary doctor in 1 week.  I have personally and contemperaneously reviewed labs and imaging and used in my decision making as above.   A medical screening exam was performed and I feel the patient has had an appropriate workup for their chief complaint at this time  and likelihood of emergent condition existing is low. They have been counseled on decision, discharge, follow up and which symptoms necessitate immediate return to the emergency department. They or their family verbally stated understanding and agreement with plan and discharged in stable condition.      Marily Memos, MD 01/26/15 [phone]

## 2015-02-20 ENCOUNTER — Encounter (HOSPITAL_COMMUNITY): Payer: Self-pay | Admitting: Emergency Medicine

## 2015-02-20 ENCOUNTER — Emergency Department (INDEPENDENT_AMBULATORY_CARE_PROVIDER_SITE_OTHER)
Admission: EM | Admit: 2015-02-20 | Discharge: 2015-02-20 | Disposition: A | Discharge status: Home / Self Care | Payer: Medicaid Other | Source: Home / Self Care | Attending: Family Medicine | Admitting: Family Medicine

## 2015-02-20 DIAGNOSIS — M5000 Cervical disc disorder with myelopathy, unspecified cervical region: Secondary | ICD-10-CM

## 2015-02-20 DIAGNOSIS — M5106 Intervertebral disc disorders with myelopathy, lumbar region: Secondary | ICD-10-CM

## 2015-02-20 MED ORDER — CYCLOBENZAPRINE HCL 5 MG PO TABS: 5.0000 mg | ORAL_TABLET | Freq: Three times a day (TID) | ORAL | Status: DC | PRN

## 2015-02-20 MED ORDER — METHYLPREDNISOLONE 4 MG PO TBPK: ORAL_TABLET | ORAL | Status: DC

## 2015-02-20 MED ORDER — METHYLPREDNISOLONE ACETATE 80 MG/ML IJ SUSP: 80.0000 mg | Freq: Once | INTRAMUSCULAR | Status: DC

## 2015-02-20 NOTE — ED Notes (Signed)
The patient reported to the [facility] with a complaint of neck and back pain x 1 week. The patient stated that she has a hx of 2 herniated discs and has chronic pain but it has spread over the last week.

## 2015-02-20 NOTE — ED Provider Notes (Signed)
CSN: [id number]     Arrival date & time 02/20/15  1330 History   First MD Initiated Contact with Patient 02/20/15 1619     Chief Complaint  Patient presents with  . Neck Pain  . Back Pain   (Consider location/radiation/quality/duration/timing/severity/associated sxs/prior Treatment) Patient is a 49 y.o. female presenting with back pain. The history is provided by the patient.  Back Pain Location:  Lumbar spine Quality:  Shooting Pain severity:  Moderate Onset quality:  Gradual Duration:  1 week Progression:  Unchanged Chronicity:  Chronic Context comment:  Known cerv disc disease, seen by [facility] [place] [facility], does not want surg.   Past Medical History  Diagnosis Date  . Borderline diabetic    Past Surgical History  Procedure Laterality Date  . Abdominal hysterectomy     Family History  Problem Relation Age of Onset  . Diabetes Mother   . Hypertension Mother   . Diabetes Father   . Hypertension Father    Social History  Substance Use Topics  . Smoking status: Never Smoker   . Smokeless tobacco: Never Used  . Alcohol Use: No   OB History    No data available     Review of Systems  Gastrointestinal: Negative.   Musculoskeletal: Positive for back pain and neck pain.  Skin: Negative.   All other systems reviewed and are negative.   Allergies  Review of patient's allergies indicates no known allergies.  Home Medications   Prior to Admission medications   Medication Sig Start Date End Date Taking? Authorizing Provider  metFORMIN (GLUCOPHAGE) 500 MG tablet Take 500 mg by mouth 2 (two) times daily with a meal.   Yes Historical Provider, MD  cyclobenzaprine (FLEXERIL) 5 MG tablet Take 1 tablet (5 mg total) by mouth 3 (three) times daily as needed for muscle spasms. 02/20/15   Linna Hoff, MD  HYDROcodone-acetaminophen (NORCO/VICODIN) 5-325 MG per tablet Take 1 tablet by mouth every 8 (eight) hours as needed for severe pain. 01/26/15   Marily Memos, MD    methylPREDNISolone (MEDROL DOSEPAK) 4 MG TBPK tablet follow package directions, start on thurs, take until finished 02/20/15   Linna Hoff, MD   Meds Ordered and Administered this Visit  Medications - No data to display  BP 143/70 mmHg  Pulse 80  Temp(Src) 99.2 F (37.3 C) (Oral)  Resp 18  SpO2 98% No data found.   Physical Exam  Constitutional: She is oriented to person, place, and time. She appears well-developed and well-nourished. She appears distressed.  Neck: Muscular tenderness present. No spinous process tenderness present. No rigidity. Decreased range of motion present. No Brudzinski's sign and no Kernig's sign noted.    Musculoskeletal: She exhibits tenderness.       Arms: Neurological: She is alert and oriented to person, place, and time. No cranial nerve deficit. Coordination normal.  Skin: Skin is warm and dry.  Nursing note and vitals reviewed.   ED Course  Procedures (including critical care time)  Labs Review Labs Reviewed - No data to display  Imaging Review No results found.   Visual Acuity Review  Right Eye Distance:   Left Eye Distance:   Bilateral Distance:    Right Eye Near:   Left Eye Near:    Bilateral Near:         MDM   1. Cervical disc disease with myelopathy   2. Lumbar disc disorder with myelopathy        Linna Hoff, MD  02/20/15 2046 

## 2015-03-04 ENCOUNTER — Ambulatory Visit: Payer: Medicaid Other | Attending: Family Medicine | Admitting: Family Medicine

## 2015-03-04 ENCOUNTER — Encounter: Payer: Self-pay | Admitting: Family Medicine

## 2015-03-04 ENCOUNTER — Encounter (HOSPITAL_BASED_OUTPATIENT_CLINIC_OR_DEPARTMENT_OTHER): Payer: Self-pay | Admitting: Clinical

## 2015-03-04 VITALS — BP 114/74 | HR 72 | Temp 98.0°F | Resp 18 | Ht 62.0 in | Wt 173.0 lb

## 2015-03-04 DIAGNOSIS — M545 Low back pain: Secondary | ICD-10-CM | POA: Insufficient documentation

## 2015-03-04 DIAGNOSIS — E119 Type 2 diabetes mellitus without complications: Secondary | ICD-10-CM | POA: Insufficient documentation

## 2015-03-04 DIAGNOSIS — M5010 Cervical disc disorder with radiculopathy, unspecified cervical region: Secondary | ICD-10-CM | POA: Diagnosis not present

## 2015-03-04 DIAGNOSIS — M5410 Radiculopathy, site unspecified: Secondary | ICD-10-CM | POA: Insufficient documentation

## 2015-03-04 DIAGNOSIS — G8929 Other chronic pain: Secondary | ICD-10-CM | POA: Diagnosis not present

## 2015-03-04 DIAGNOSIS — E1169 Type 2 diabetes mellitus with other specified complication: Secondary | ICD-10-CM | POA: Diagnosis present

## 2015-03-04 DIAGNOSIS — M5416 Radiculopathy, lumbar region: Secondary | ICD-10-CM

## 2015-03-04 DIAGNOSIS — F329 Major depressive disorder, single episode, unspecified: Secondary | ICD-10-CM

## 2015-03-04 DIAGNOSIS — F32A Depression, unspecified: Secondary | ICD-10-CM

## 2015-03-04 LAB — POCT GLYCOSYLATED HEMOGLOBIN (HGB A1C): HEMOGLOBIN A1C: 6.2

## 2015-03-04 LAB — GLUCOSE, POCT (MANUAL RESULT ENTRY): POC Glucose: 122 mg/dl — AB (ref 70–99)

## 2015-03-04 MED ORDER — IBUPROFEN 800 MG PO TABS: 800.0000 mg | ORAL_TABLET | Freq: Three times a day (TID) | ORAL | Status: DC | PRN

## 2015-03-04 MED ORDER — CANE MISC: 1.0000 | Status: DC | PRN

## 2015-03-04 MED ORDER — TRAMADOL HCL 50 MG PO TABS: 50.0000 mg | ORAL_TABLET | Freq: Three times a day (TID) | ORAL | Status: DC | PRN

## 2015-03-04 MED ORDER — METFORMIN HCL 500 MG PO TABS: 500.0000 mg | ORAL_TABLET | Freq: Two times a day (BID) | ORAL | Status: DC

## 2015-03-04 MED ORDER — GABAPENTIN 300 MG PO CAPS: 300.0000 mg | ORAL_CAPSULE | Freq: Three times a day (TID) | ORAL | Status: DC
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
## 2015-03-04 NOTE — Progress Notes (Signed)
ASSESSMENT: Pt currently experiencing symptoms of depression. Pt needs to f/u with PCP and [facility]; would benefit from psychoeducation and supportive counseling regarding coping with symptoms of depression.  Stage of Change: contemplative  PLAN: 1. F/U with behavioral health consultant in as needed 2. Psychiatric Medications: none. 3. Behavioral recommendation(s):   -Obtain cane at medical supply store -Continue walking as able, accepting help and massages -Go to physical therapy after referral is made -Consider making "rice sock" to keep in freezer for pain relief -Consider reading educational material regarding coping with symptoms of depression and chronic pain SUBJECTIVE: Pt. referred by Dr Armen Pickup for symptoms of depression:  Pt. reports the following symptoms/concerns: Pt states that she does not think she is depressed, but that the increase in pain is making her feel "a little depressed"; she does not like to take so many medications because she has to take care of her granddaughter. She does walk a little, as often as she is able, and that helps ease some of the pain; her husband helps around the house, and massages her legs. She also has headaches.  Duration of problem: about two years Severity: moderate  OBJECTIVE: Orientation & Cognition: Oriented x3. Thought processes normal and appropriate to situation. Mood: appropriate. Affect: appropriate Appearance: appropriate Risk of harm to self or others: no risk of harm to self or others Substance use: none Assessments administered: PHQ9: 13/ GAD7:6   Diagnosis: Depression CPT Code: F32.9 -------------------------------------------- Other(s) present in the room: 11yo granddaughter  Time spent with patient in exam room: 16 minutes

## 2015-03-04 NOTE — Patient Instructions (Addendum)
Ms. Arizmendi,    Diagnoses and all orders for this visit:  Type 2 diabetes mellitus with other specified complication (HCC) -     POCT A1C -     POCT glucose (manual entry) -     Cancel: Microalbumin, urine -     Microalbumin/Creatinine Ratio, Urine -     metFORMIN (GLUCOPHAGE) 500 MG tablet; Take 1 tablet (500 mg total) by mouth 2 (two) times daily with a meal.  Cervical disc disorder with radiculopathy of cervical region -     traMADol (ULTRAM) 50 MG tablet; Take 1 tablet (50 mg total) by mouth every 8 (eight) hours as needed. -     Ambulatory referral to Pain Clinic  Chronic low back pain  Chronic radicular low back pain -     traMADol (ULTRAM) 50 MG tablet; Take 1 tablet (50 mg total) by mouth every 8 (eight) hours as needed. -     Ambulatory referral to Pain Clinic -     Misc. Devices (CANE) MISC; 1 each by Does not apply route as needed. -     ibuprofen (ADVIL,MOTRIN) 800 MG tablet; Take 1 tablet (800 mg total) by mouth every 8 (eight) hours as needed. -     gabapentin (NEURONTIN) 300 MG capsule; Take 1 capsule (300 mg total) by mouth 3 (three) times daily.   F/u in 2-3 months for chronic low back pain   Dr. Armen Pickup

## 2015-03-04 NOTE — Progress Notes (Signed)
Pt's here for 44mo f/up for back pain and neck pain at level 7. Pt states she has a herniated disc in back and 3 in her neck.Pt reports taken all meds today.

## 2015-03-05 LAB — MICROALBUMIN / CREATININE URINE RATIO
CREATININE, URINE: 183.7 mg/dL
Microalb Creat Ratio: 3.3 mg/g (ref 0.0–30.0)
Microalb, Ur: 0.6 mg/dL (ref <2.0)

## 2015-04-06 ENCOUNTER — Encounter (HOSPITAL_COMMUNITY): Payer: Self-pay | Admitting: Emergency Medicine

## 2015-04-06 ENCOUNTER — Emergency Department (HOSPITAL_COMMUNITY): Payer: Medicaid Other

## 2015-04-06 ENCOUNTER — Emergency Department (HOSPITAL_COMMUNITY)
Admission: EM | Admit: 2015-04-06 | Discharge: 2015-04-06 | Disposition: A | Discharge status: Home / Self Care | Payer: Medicaid Other | Attending: Emergency Medicine | Admitting: Emergency Medicine

## 2015-04-06 DIAGNOSIS — R0789 Other chest pain: Secondary | ICD-10-CM

## 2015-04-06 DIAGNOSIS — R42 Dizziness and giddiness: Secondary | ICD-10-CM | POA: Diagnosis present

## 2015-04-06 DIAGNOSIS — M542 Cervicalgia: Secondary | ICD-10-CM | POA: Insufficient documentation

## 2015-04-06 DIAGNOSIS — Z79899 Other long term (current) drug therapy: Secondary | ICD-10-CM | POA: Diagnosis not present

## 2015-04-06 DIAGNOSIS — M79602 Pain in left arm: Secondary | ICD-10-CM | POA: Diagnosis not present

## 2015-04-06 DIAGNOSIS — Z9889 Other specified postprocedural states: Secondary | ICD-10-CM | POA: Diagnosis not present

## 2015-04-06 DIAGNOSIS — M545 Low back pain: Secondary | ICD-10-CM | POA: Diagnosis not present

## 2015-04-06 DIAGNOSIS — Z7984 Long term (current) use of oral hypoglycemic drugs: Secondary | ICD-10-CM | POA: Insufficient documentation

## 2015-04-06 DIAGNOSIS — R110 Nausea: Secondary | ICD-10-CM | POA: Diagnosis not present

## 2015-04-06 DIAGNOSIS — G8929 Other chronic pain: Secondary | ICD-10-CM

## 2015-04-06 DIAGNOSIS — R5383 Other fatigue: Secondary | ICD-10-CM | POA: Diagnosis not present

## 2015-04-06 DIAGNOSIS — M5010 Cervical disc disorder with radiculopathy, unspecified cervical region: Secondary | ICD-10-CM | POA: Insufficient documentation

## 2015-04-06 LAB — URINALYSIS, ROUTINE W REFLEX MICROSCOPIC
Bilirubin Urine: NEGATIVE
GLUCOSE, UA: NEGATIVE mg/dL
Hgb urine dipstick: NEGATIVE
KETONES UR: NEGATIVE mg/dL
LEUKOCYTES UA: NEGATIVE
Nitrite: NEGATIVE
PH: 7 (ref 5.0–8.0)
Protein, ur: NEGATIVE mg/dL
Specific Gravity, Urine: 1.012 (ref 1.005–1.030)
Urobilinogen, UA: 1 mg/dL (ref 0.0–1.0)

## 2015-04-06 LAB — BASIC METABOLIC PANEL
Anion gap: 9 (ref 5–15)
BUN: 8 mg/dL (ref 6–20)
CHLORIDE: 103 mmol/L (ref 101–111)
CO2: 26 mmol/L (ref 22–32)
CREATININE: 0.75 mg/dL (ref 0.44–1.00)
Calcium: 9.8 mg/dL (ref 8.9–10.3)
GFR calc Af Amer: >60 mL/min (ref >60)
GFR calc non Af Amer: >60 mL/min (ref >60)
GLUCOSE: 114 mg/dL — AB (ref 65–99)
Potassium: 3.6 mmol/L (ref 3.5–5.1)
Sodium: 138 mmol/L (ref 135–145)

## 2015-04-06 LAB — CBC
HCT: 42.4 % (ref 36.0–46.0)
Hemoglobin: 14.1 g/dL (ref 12.0–15.0)
MCH: 30.1 pg (ref 26.0–34.0)
MCHC: 33.3 g/dL (ref 30.0–36.0)
MCV: 90.4 fL (ref 78.0–100.0)
PLATELETS: 319 10*3/uL (ref 150–400)
RBC: 4.69 MIL/uL (ref 3.87–5.11)
RDW: 12 % (ref 11.5–15.5)
WBC: 6.1 10*3/uL (ref 4.0–10.5)

## 2015-04-06 LAB — TROPONIN I

## 2015-04-06 LAB — I-STAT TROPONIN, ED: Troponin i, poc: 0 ng/mL (ref 0.00–0.08)

## 2015-04-06 MED ORDER — SODIUM CHLORIDE 0.9 % IV BOLUS (SEPSIS)
1000.0000 mL | Freq: Once | INTRAVENOUS | Status: AC
  Administered 2015-04-06: 1000 mL via INTRAVENOUS

## 2015-04-06 MED ORDER — ASPIRIN 325 MG PO TABS
325.0000 mg | ORAL_TABLET | Freq: Once | ORAL | Status: AC
  Administered 2015-04-06: 325 mg via ORAL
  Filled 2015-04-06: qty 1

## 2015-04-06 MED ORDER — NITROGLYCERIN 0.4 MG SL SUBL
0.4000 mg | SUBLINGUAL_TABLET | SUBLINGUAL | Status: DC | PRN
  Administered 2015-04-06: 0.4 mg via SUBLINGUAL
  Filled 2015-04-06: qty 1

## 2015-04-06 MED ORDER — MORPHINE SULFATE (PF) 4 MG/ML IV SOLN
4.0000 mg | Freq: Once | INTRAVENOUS | Status: AC
  Administered 2015-04-06: 4 mg via INTRAVENOUS
  Filled 2015-04-06: qty 1

## 2015-04-06 MED ORDER — HYDROMORPHONE HCL 1 MG/ML IJ SOLN
1.0000 mg | Freq: Once | INTRAMUSCULAR | Status: AC
  Administered 2015-04-06: 1 mg via INTRAVENOUS
  Filled 2015-04-06: qty 1

## 2015-04-06 MED ORDER — PROMETHAZINE HCL 25 MG/ML IJ SOLN
25.0000 mg | Freq: Once | INTRAMUSCULAR | Status: AC
  Administered 2015-04-06: 25 mg via INTRAVENOUS
  Filled 2015-04-06: qty 1

## 2015-04-06 MED ORDER — ONDANSETRON HCL 4 MG/2ML IJ SOLN
4.0000 mg | Freq: Once | INTRAMUSCULAR | Status: AC
  Administered 2015-04-06: 4 mg via INTRAVENOUS
  Filled 2015-04-06: qty 2

## 2015-04-06 MED ORDER — PROMETHAZINE HCL 25 MG PO TABS: 25.0000 mg | ORAL_TABLET | Freq: Four times a day (QID) | ORAL | Status: DC | PRN

## 2015-04-06 MED ORDER — GI COCKTAIL ~~LOC~~
30.0000 mL | Freq: Once | ORAL | Status: AC
  Administered 2015-04-06: 30 mL via ORAL
  Filled 2015-04-06: qty 30

## 2015-04-06 NOTE — ED Provider Notes (Signed)
CSN: [id number]     Arrival date & time 04/06/15  1322 History   First MD Initiated Contact with Patient 04/06/15 1510     Chief Complaint  Patient presents with  . Dizziness  . Fatigue  . Chest Pain     (Consider location/radiation/quality/duration/timing/severity/associated sxs/prior Treatment) HPI Comments: Jill Anderson is a 49 y.o. female with a PMHx of DM2, chronic low back pain, cervical bulging disc with radiculopathy, and prior abd hysterectomy, who presents to the ED with complaints of one day of intermittent chest pain, fatigue with exertion, and lightheadedness with standing. She reports that yesterday morning when she stood up she felt somewhat lightheaded, and this is occurred several times since then mostly when she stands up from a seated position. Additionally she reports sudden onset 8/10 central chest pain radiating to the left chest left jaw/neck and left arm, pressure-like, intermittent since approximately 6 PM yesterday, worse with exertion or movement, and unrelieved with tramadol. Initially she states she thought this was coming from her neck because she has chronic neck pain, but states that this pain is different. She states she was at rest when symptoms started. Associated symptoms include nausea.  She denies any fevers, chills, diaphoresis, shortness breath, cough, leg swelling, recent travel/surgery/immobilization, estrogen use, history of DVT/PE, abdominal pain, vomiting, diarrhea, constipation, dysuria, hematuria, numbness, tingling, weakness, claudication, orthopnea, headache, or vision changes. She is a nonsmoker. Positive family history of CAD in her father and her sister, and MI in her mother which resulted in her death at age 39. She recently moved here from [place]. PCP is Dr. Armen [facility].  Patient is a 49 y.o. female presenting with dizziness and chest pain. The history is provided by the patient. No language interpreter was used.  Dizziness Quality:   Lightheadedness Severity:  Mild Onset quality:  Gradual Duration:  1 day Timing:  Intermittent Progression:  Unchanged Chronicity:  New Context: standing up   Relieved by:  Lying down Worsened by:  Standing up Ineffective treatments:  None tried Associated symptoms: chest pain and nausea   Associated symptoms: no diarrhea, no headaches, no shortness of breath, no syncope, no vision changes, no vomiting and no weakness   Chest Pain Pain location:  Substernal area Pain quality: pressure   Pain radiates to:  Neck, L jaw and L shoulder Pain radiates to the back: no   Pain severity:  Moderate Onset quality:  Sudden Duration:  9 hours Timing:  Intermittent Progression:  Waxing and waning Chronicity:  New Context: at rest   Relieved by:  None tried Worsened by:  Movement and exertion Ineffective treatments: tramadol. Associated symptoms: dizziness, fatigue and nausea   Associated symptoms: no abdominal pain, no claudication, no cough, no diaphoresis, no fever, no headache, no lower extremity edema, no numbness, no orthopnea, no shortness of breath, no syncope, not vomiting and no weakness   Risk factors: diabetes mellitus   Risk factors: no birth control, no high cholesterol, no hypertension, no immobilization, no prior DVT/PE, no smoking and no surgery     Past Medical History  Diagnosis Date  . Borderline diabetic    Past Surgical History  Procedure Laterality Date  . Abdominal hysterectomy     Family History  Problem Relation Age of Onset  . Diabetes Mother   . Hypertension Mother   . Heart disease Mother   . Diabetes Father   . Hypertension Father   . Heart disease Father   . Cancer Maternal Aunt  liver   Social History  Substance Use Topics  . Smoking status: Never Smoker   . Smokeless tobacco: Never Used  . Alcohol Use: No   OB History    No data available     Review of Systems  Constitutional: Positive for fatigue. Negative for fever, chills and  diaphoresis.  Eyes: Negative for visual disturbance.  Respiratory: Negative for cough and shortness of breath.   Cardiovascular: Positive for chest pain. Negative for orthopnea, claudication, leg swelling and syncope.  Gastrointestinal: Positive for nausea. Negative for vomiting, abdominal pain, diarrhea and constipation.  Genitourinary: Negative for dysuria and hematuria.  Musculoskeletal: Positive for myalgias (L arm radiating from chest) and neck pain (radiating from chest). Negative for arthralgias.  Skin: Negative for color change.  Allergic/Immunologic: Positive for immunocompromised state (borderline diabetes).  Neurological: Positive for dizziness and light-headedness. Negative for syncope, weakness, numbness and headaches.  Psychiatric/Behavioral: Negative for confusion.   10 Systems reviewed and are negative for acute change except as noted in the HPI.    Allergies  Review of patient's allergies indicates no known allergies.  Home Medications   Prior to Admission medications   Medication Sig Start Date End Date Taking? Authorizing Provider  gabapentin (NEURONTIN) 300 MG capsule Take 1 capsule (300 mg total) by mouth 3 (three) times daily. 03/04/15   Dessa PhiJosalyn Funches, MD  HYDROcodone-acetaminophen (NORCO/VICODIN) 5-325 MG per tablet Take 1 tablet by mouth every 8 (eight) hours as needed for severe pain. 01/26/15   Marily MemosJason Mesner, MD  ibuprofen (ADVIL,MOTRIN) 800 MG tablet Take 1 tablet (800 mg total) by mouth every 8 (eight) hours as needed. 03/04/15   Dessa PhiJosalyn Funches, MD  metFORMIN (GLUCOPHAGE) 500 MG tablet Take 1 tablet (500 mg total) by mouth 2 (two) times daily with a meal. 03/04/15   Dessa PhiJosalyn Funches, MD  Misc. Devices (CANE) MISC 1 each by Does not apply route as needed. 03/04/15   Josalyn Funches, MD  traMADol (ULTRAM) 50 MG tablet Take 1 tablet (50 mg total) by mouth every 8 (eight) hours as needed. 03/04/15   Josalyn Funches, MD   Triage VS: BP 170/88 mmHg  Pulse 72  Temp(Src)  98 F (36.7 C) (Oral)  Resp 17  SpO2 98% Exam VS: BP 128/69  Pulse 69  Resp 20  SpO2 98% Physical Exam  Constitutional: She is oriented to person, place, and time. Vital signs are normal. She appears well-developed and well-nourished.  Non-toxic appearance. No distress.  Afebrile, nontoxic, NAD  HENT:  Head: Normocephalic and atraumatic.  Mouth/Throat: Oropharynx is clear and moist and mucous membranes are normal.  Eyes: Conjunctivae and EOM are normal. Pupils are equal, round, and reactive to light. Right eye exhibits no discharge. Left eye exhibits no discharge.  Neck: Normal range of motion. Neck supple.  Cardiovascular: Normal rate, regular rhythm, normal heart sounds and intact distal pulses.  Exam reveals no gallop and no friction rub.   No murmur heard. RRR, nl s1/s2, no m/r/g, distal pulses intact, no pedal edema   Pulmonary/Chest: Effort normal and breath sounds normal. No respiratory distress. She has no decreased breath sounds. She has no wheezes. She has no rhonchi. She has no rales.  Abdominal: Soft. Normal appearance and bowel sounds are normal. She exhibits no distension. There is no tenderness. There is no rigidity, no rebound, no guarding, no CVA tenderness, no tenderness at McBurney's point and negative Murphy's sign.  Musculoskeletal: Normal range of motion.  MAE x4 Strength and sensation grossly intact Distal pulses intact  No pedal edema, neg homan's bilaterally  Gait steady  Neurological: She is alert and oriented to person, place, and time. She has normal strength. No sensory deficit.  No focal neuro deficits  Skin: Skin is warm, dry and intact. No rash noted.  Psychiatric: She has a normal mood and affect.  Nursing note and vitals reviewed.   ED Course  Procedures (including critical care time)  17:56:28 Orthostatic Vital Signs BC  Orthostatic Lying  - BP- Lying: 126/70 mmHg ; Pulse- Lying: 70  Orthostatic Sitting - BP- Sitting: 120/62 mmHg (felt dizzy  with sitting up) ; Pulse- Sitting: 89  Orthostatic Standing at 0 minutes - BP- Standing at 0 minutes: 124/65 mmHg ; Pulse- Standing at 0 minutes: 91       Labs Review Labs Reviewed  BASIC METABOLIC PANEL - Abnormal; Notable for the following:    Glucose, Bld 114 (*)    All other components within normal limits  URINALYSIS, ROUTINE W REFLEX MICROSCOPIC (NOT AT [facility]) - Abnormal; Notable for the following:    APPearance HAZY (*)    All other components within normal limits  CBC  TROPONIN I  CBG MONITORING, ED  Rosezena Sensor, ED    Imaging Review Dg Chest 2 View  04/06/2015  CLINICAL DATA:  49 year old female with history of chest pain since yesterday evening. EXAM: CHEST  2 VIEW COMPARISON:  Chest x-ray 08/29/2014. FINDINGS: Lung volumes are normal. No consolidative airspace disease. No pleural effusions. No pneumothorax. No pulmonary nodule or mass noted. Pulmonary vasculature and the cardiomediastinal silhouette are within normal limits. IMPRESSION: No radiographic evidence of acute cardiopulmonary disease. Electronically Signed   By: Trudie Reed M.D.   On: 04/06/2015 16:44   I have personally reviewed and evaluated these images and lab results as part of my medical decision-making.   EKG Interpretation   Date/Time:  Saturday April 06 2015 13:30:33 EDT Ventricular Rate:  76 PR Interval:  154 QRS Duration: 76 QT Interval:  366 QTC Calculation: 411 R Axis:   66 Text Interpretation:  Normal sinus rhythm Normal ECG No significant change  since last tracing Confirmed by Anitra Lauth  MD, Alphonzo Lemmings ([id number]) on 04/06/2015  3:43:04 PM      MDM   Final diagnoses:  Atypical chest pain  Chronic neck pain  Left arm pain  Nausea  Orthostatic lightheadedness  Other fatigue    49 y.o. female here with sudden onset CP radiating to L arm/neck/jaw, nausea, lightheadedness with standing onset yesterday. Also feels fatigued. +FHx of cardiac disease, MI in mother at age 6.  Moderately suspicious story. BMP and CBC WNL, U/A neg. Awaiting Trop. EKG unremarkable. HEART score 3, could potentially benefit from ACS r/o here with delta trop, but some reproducibility to her CP could indicate it's musculoskeletal. Will get orthostatic VS, give nausea meds, GI cocktail, morphine, ASA, and NTG. Will reassess shortly.   5:28 PM Trop neg at 4pm (10hrs s/p onset), will recheck at 7pm. CXR clear. Pain unresolved after ASA, morphine, GI cocktail, and 3NTG. Will give dilaudid now. Still hasn't had orthostatics done, but bolus finished. Will see about getting this done now. Will reassess after 7pm trop.  6:02 PM Orthostatic VS after bolus showing increase in HR by 20bpm with laying to standing, and symptomatic. Will give another bolus. Of note, this was done after dilaudid was given. Still feeling somewhat nauseated, will give phenergan now. States pain meds are helping some, still having some discomfort though. Will reassess after second trop at  7pm  7:56 PM Pain and nausea improved. Lightheadedness improved. Second trop neg. Doubt ACS/dissection/PE/etc. Likely musculoskeletal pain. Discussed use of home pain meds. Will d/c home with phenergan. Pt tolerating PO well here. F/up with PCP in 3-5 days for ongoing management and possible outpt stress testing. I explained the diagnosis and have given explicit precautions to return to the ER including for any other new or worsening symptoms. The patient understands and accepts the medical plan as it's been dictated and I have answered their questions. Discharge instructions concerning home care and prescriptions have been given. The patient is STABLE and is discharged to home in good condition.  BP 111/77 mmHg  Pulse 69  Temp(Src) 98 F (36.7 C) (Oral)  Resp 15  SpO2 97%  Meds ordered this encounter  Medications  . sodium chloride 0.9 % bolus 1,000 mL    Sig:   . gi cocktail (Maalox,Lidocaine,Donnatal)    Sig:   . aspirin tablet 325 mg     Sig:   . morphine 4 MG/ML injection 4 mg    Sig:   . nitroGLYCERIN (NITROSTAT) SL tablet 0.4 mg    Sig:   . ondansetron (ZOFRAN) injection 4 mg    Sig:   . HYDROmorphone (DILAUDID) injection 1 mg    Sig:   . sodium chloride 0.9 % bolus 1,000 mL    Sig:   . promethazine (PHENERGAN) injection 25 mg    Sig:   . promethazine (PHENERGAN) 25 MG tablet    Sig: Take 1 tablet (25 mg total) by mouth every 6 (six) hours as needed for nausea or vomiting.    Dispense:  10 tablet    Refill:  0    Order Specific Question:  Supervising Provider    Answer:  Eber Hong [[id number]]     Mirinda Monte Camprubi-Soms, PA-C 04/06/15 1957  Gwyneth Sprout, MD 04/07/15 [phone]

## 2015-04-06 NOTE — Discharge Instructions (Signed)
Your lab results today were unremarkable. Your pain could be from your neck, or your chest wall. Take your home pain medications for pain. Stay well hydrated, use phenergan as needed for nausea. Get plenty of rest and drink lots of water. Follow up with your regular doctor in the next 3-5 days for recheck and ongoing management of your symptoms. Return to the ER for changes or worsening symptoms.   Nonspecific Chest Pain It is often hard to find the cause of chest pain. There is always a chance that your pain could be related to something serious, such as a heart attack or a blood clot in your lungs. Chest pain can also be caused by conditions that are not life-threatening. If you have chest pain, it is very important to follow up with your doctor.  HOME CARE  If you were prescribed an antibiotic medicine, finish it all even if you start to feel better.  Avoid any activities that cause chest pain.  Do not use any tobacco products, including cigarettes, chewing tobacco, or electronic cigarettes. If you need help quitting, ask your doctor.  Do not drink alcohol.  Take medicines only as told by your doctor.  Keep all follow-up visits as told by your doctor. This is important. This includes any further testing if your chest pain does not go away.  Your doctor may tell you to keep your head raised (elevated) while you sleep.  Make lifestyle changes as told by your doctor. These may include:  Getting regular exercise. Ask your doctor to suggest some activities that are safe for you.  Eating a heart-healthy diet. Your doctor or a diet specialist (dietitian) can help you to learn healthy eating options.  Maintaining a healthy weight.  Managing diabetes, if necessary.  Reducing stress. GET HELP IF:  Your chest pain does not go away, even after treatment.  You have a rash with blisters on your chest.  You have a fever. GET HELP RIGHT AWAY IF:  Your chest pain is worse.  You have an  increasing cough, or you cough up blood.  You have severe belly (abdominal) pain.  You feel extremely weak.  You pass out (faint).  You have chills.  You have sudden, unexplained chest discomfort.  You have sudden, unexplained discomfort in your arms, back, neck, or jaw.  You have shortness of breath at any time.  You suddenly start to sweat, or your skin gets clammy.  You feel nauseous.  You vomit.  You suddenly feel light-headed or dizzy.  Your heart begins to beat quickly, or it feels like it is skipping beats. These symptoms may be an emergency. Do not wait to see if the symptoms will go away. Get medical help right away. Call your local emergency services (911 in the [place] Do not drive yourself to the hospital.   This information is not intended to replace advice given to you by your health care provider. Make sure you discuss any questions you have with your health care provider.   Document Released: 11/04/2007 Document Revised: 06/08/2014 Document Reviewed: 12/22/2013 Elsevier Interactive Patient Education 2016 Elsevier Inc.  Nausea, Adult Nausea is the feeling that you have an upset stomach or have to vomit. Nausea by itself is not likely a serious concern, but it may be an early sign of more serious medical problems. As nausea gets worse, it can lead to vomiting. If vomiting develops, there is the risk of dehydration.  CAUSES   Viral infections.  Food poisoning.  Medicines.  Pregnancy.  Motion sickness.  Migraine headaches.  Emotional distress.  Severe pain from any source.  Alcohol intoxication. HOME CARE INSTRUCTIONS  Get plenty of rest.  Ask your caregiver about specific rehydration instructions.  Eat small amounts of food and sip liquids more often.  Take all medicines as told by your caregiver. SEEK MEDICAL CARE IF:  You have not improved after 2 days, or you get worse.  You have a headache. SEEK IMMEDIATE MEDICAL CARE IF:   You  have a fever.  You faint.  You keep vomiting or have blood in your vomit.  You are extremely weak or dehydrated.  You have dark or bloody stools.  You have severe chest or abdominal pain. MAKE SURE YOU:  Understand these instructions.  Will watch your condition.  Will get help right away if you are not doing well or get worse.   This information is not intended to replace advice given to you by your health care provider. Make sure you discuss any questions you have with your health care provider.   Document Released: 06/25/2004 Document Revised: 06/08/2014 Document Reviewed: 01/28/2011 Elsevier Interactive Patient Education 2016 Elsevier Inc.  Chest Wall Pain Chest wall pain is pain in or around the bones and muscles of your chest. Sometimes, an injury causes this pain. Sometimes, the cause may not be known. This pain may take several weeks or longer to get better. HOME CARE INSTRUCTIONS  Pay attention to any changes in your symptoms. Take these actions to help with your pain:   Rest as told by your health care provider.   Avoid activities that cause pain. These include any activities that use your chest muscles or your abdominal and side muscles to lift heavy items.   If directed, apply ice to the painful area:  Put ice in a plastic bag.  Place a towel between your skin and the bag.  Leave the ice on for 20 minutes, 2-3 times per day.  Take over-the-counter and prescription medicines only as told by your health care provider.  Do not use tobacco products, including cigarettes, chewing tobacco, and e-cigarettes. If you need help quitting, ask your health care provider.  Keep all follow-up visits as told by your health care provider. This is important. SEEK MEDICAL CARE IF:  You have a fever.  Your chest pain becomes worse.  You have new symptoms. SEEK IMMEDIATE MEDICAL CARE IF:  You have nausea or vomiting.  You feel sweaty or light-headed.  You have a  cough with phlegm (sputum) or you cough up blood.  You develop shortness of breath.   This information is not intended to replace advice given to you by your health care provider. Make sure you discuss any questions you have with your health care provider.   Document Released: 05/18/2005 Document Revised: 02/06/2015 Document Reviewed: 08/13/2014 Elsevier Interactive Patient Education 2016 Elsevier Inc.  Radicular Pain Radicular pain in either the arm or leg is usually from a bulging or herniated disk in the spine. A piece of the herniated disk may press against the nerves as the nerves exit the spine. This causes pain which is felt at the tips of the nerves down the arm or leg. Other causes of radicular pain may include:  Fractures.  Heart disease.  Cancer.  An abnormal and usually degenerative state of the nervous system or nerves (neuropathy). Diagnosis may require CT or MRI scanning to determine the primary cause.  Nerves that start at the neck (nerve  roots) may cause radicular pain in the outer shoulder and arm. It can spread down to the thumb and fingers. The symptoms vary depending on which nerve root has been affected. In most cases radicular pain improves with conservative treatment. Neck problems may require physical therapy, a neck collar, or cervical traction. Treatment may take many weeks, and surgery may be considered if the symptoms do not improve.  Conservative treatment is also recommended for sciatica. Sciatica causes pain to radiate from the lower back or buttock area down the leg into the foot. Often there is a history of back problems. Most patients with sciatica are better after 2 to 4 weeks of rest and other supportive care. Short term bed rest can reduce the disk pressure considerably. Sitting, however, is not a good position since this increases the pressure on the disk. You should avoid bending, lifting, and all other activities which make the problem worse. Traction  can be used in severe cases. Surgery is usually reserved for patients who do not improve within the first months of treatment. Only take over-the-counter or prescription medicines for pain, discomfort, or fever as directed by your caregiver. Narcotics and muscle relaxants may help by relieving more severe pain and spasm and by providing mild sedation. Cold or massage can give significant relief. Spinal manipulation is not recommended. It can increase the degree of disc protrusion. Epidural steroid injections are often effective treatment for radicular pain. These injections deliver medicine to the spinal nerve in the space between the protective covering of the spinal cord and back bones (vertebrae). Your caregiver can give you more information about steroid injections. These injections are most effective when given within two weeks of the onset of pain.  You should see your caregiver for follow up care as recommended. A program for neck and back injury rehabilitation with stretching and strengthening exercises is an important part of management.  SEEK IMMEDIATE MEDICAL CARE IF:  You develop increased pain, weakness, or numbness in your arm or leg.  You develop difficulty with bladder or bowel control.  You develop abdominal pain.   This information is not intended to replace advice given to you by your health care provider. Make sure you discuss any questions you have with your health care provider.   Document Released: 06/25/2004 Document Revised: 06/08/2014 Document Reviewed: 12/12/2014 Elsevier Interactive Patient Education 2016 [employer].  Near-Syncope Near-syncope (commonly known as near fainting) is sudden weakness, dizziness, or feeling like you might pass out. This can happen when getting up or while standing for a long time. It is caused by a sudden decrease in blood flow to the brain, which can occur for various reasons. Most of the reasons are not serious.  HOME CARE Watch your  condition for any changes.  Have someone stay with you until you feel stable.  If you feel like you are going to pass out:  Lie down right away.  Prop your feet up if you can.  Breathe deeply and steadily.  Move only when the feeling has gone away. Most of the time, this feeling lasts only a few minutes. You may feel tired for several hours.  Drink enough fluids to keep your pee (urine) clear or pale yellow.  If you are taking blood pressure or heart medicine, stand up slowly.  Follow up with your doctor as told. GET HELP RIGHT AWAY IF:   You have a severe headache.  You have unusual pain in the chest, belly (abdomen), or back.  You have bleeding from the mouth or butt (rectum), or you have black or tarry poop (stool).  You feel your heart beat differently than normal, or you have a very fast pulse.  You pass out, or you twitch and shake when you pass out.  You pass out when sitting or lying down.  You feel confused.  You have trouble walking.  You are weak.  You have vision problems. MAKE SURE YOU:   Understand these instructions.  Will watch your condition.  Will get help right away if you are not doing well or get worse.   This information is not intended to replace advice given to you by your health care provider. Make sure you discuss any questions you have with your health care provider.   Document Released: 11/04/2007 Document Revised: 06/08/2014 Document Reviewed: 10/21/2012 Elsevier Interactive Patient Education [employer] Elsevier Inc.

## 2015-04-06 NOTE — ED Notes (Signed)
Pt. Stated, I started feeling fatigue and dizzy and my chest hurts since yesterday.

## 2015-04-24 ENCOUNTER — Telehealth: Payer: Self-pay | Admitting: *Deleted

## 2015-04-24 NOTE — Telephone Encounter (Signed)
-----   Message from Dessa PhiJosalyn Funches, MD sent at 03/05/2015  9:16 AM EDT ----- Normal urine microalbumin/cr

## 2015-04-24 NOTE — Telephone Encounter (Signed)
LVM to return call.

## 2015-05-03 ENCOUNTER — Inpatient Hospital Stay: Payer: Medicaid Other | Admitting: Family Medicine

## 2015-05-21 ENCOUNTER — Ambulatory Visit: Payer: Medicaid Other | Admitting: Physical Therapy

## 2015-06-26 ENCOUNTER — Emergency Department (INDEPENDENT_AMBULATORY_CARE_PROVIDER_SITE_OTHER)
Admission: EM | Admit: 2015-06-26 | Discharge: 2015-06-26 | Disposition: A | Discharge status: Home / Self Care | Payer: Medicaid Other | Source: Home / Self Care | Attending: Family Medicine | Admitting: Family Medicine

## 2015-06-26 ENCOUNTER — Encounter (HOSPITAL_COMMUNITY): Payer: Self-pay | Admitting: *Deleted

## 2015-06-26 DIAGNOSIS — H01001 Unspecified blepharitis right upper eyelid: Secondary | ICD-10-CM

## 2015-06-26 MED ORDER — CEPHALEXIN 500 MG PO CAPS: 500.0000 mg | ORAL_CAPSULE | Freq: Three times a day (TID) | ORAL | Status: DC

## 2015-06-26 MED ORDER — ERYTHROMYCIN 5 MG/GM OP OINT: TOPICAL_OINTMENT | Freq: Three times a day (TID) | OPHTHALMIC | Status: DC

## 2015-06-26 NOTE — ED Notes (Signed)
R  Eye        Is swelling     Since  Yesterday        No drainage   Redness  Is  Present          denys  Any  Injury

## 2015-06-26 NOTE — Discharge Instructions (Signed)
Warm cloth to right eye before medicine, start twice tonight. Return as needed.

## 2015-06-26 NOTE — ED Provider Notes (Signed)
CSN: [id number]     Arrival date & time 06/26/15  1507 History   First MD Initiated Contact with Patient 06/26/15 1658     No chief complaint on file.  (Consider location/radiation/quality/duration/timing/severity/associated sxs/prior Treatment) Patient is a 50 y.o. female presenting with eye problem. The history is provided by the patient.  Eye Problem Location:  R eye Quality:  Aching Severity:  Mild Onset quality:  Gradual Duration:  1 day Progression:  Unchanged Chronicity:  New Context comment:  Swelling and soreness to right upper lid since yest, no ocular sx, no drainage. Relieved by:  None tried Associated symptoms: redness   Associated symptoms: no discharge and no photophobia     Past Medical History  Diagnosis Date  . Borderline diabetic    Past Surgical History  Procedure Laterality Date  . Abdominal hysterectomy     Family History  Problem Relation Age of Onset  . Diabetes Mother   . Hypertension Mother   . Heart disease Mother   . Diabetes Father   . Hypertension Father   . Heart disease Father   . Cancer Maternal Aunt     liver   Social History  Substance Use Topics  . Smoking status: Never Smoker   . Smokeless tobacco: Never Used  . Alcohol Use: No   OB History    No data available     Review of Systems  Constitutional: Negative.   HENT: Negative.   Eyes: Positive for pain and redness. Negative for photophobia, discharge and visual disturbance.  Respiratory: Negative.   All other systems reviewed and are negative.   Allergies  Review of patient's allergies indicates no known allergies.  Home Medications   Prior to Admission medications   Medication Sig Start Date End Date Taking? Authorizing Provider  gabapentin (NEURONTIN) 300 MG capsule Take 1 capsule (300 mg total) by mouth 3 (three) times daily. Patient not taking: Reported on 04/06/2015 03/04/15   Dessa Phi, MD  HYDROcodone-acetaminophen (NORCO/VICODIN) 5-325 MG per tablet  Take 1 tablet by mouth every 8 (eight) hours as needed for severe pain. Patient not taking: Reported on 04/06/2015 01/26/15   Marily Memos, MD  ibuprofen (ADVIL,MOTRIN) 800 MG tablet Take 1 tablet (800 mg total) by mouth every 8 (eight) hours as needed. Patient taking differently: Take 800 mg by mouth every 8 (eight) hours as needed (pain).  03/04/15   Dessa Phi, MD  metFORMIN (GLUCOPHAGE) 500 MG tablet Take 1 tablet (500 mg total) by mouth 2 (two) times daily with a meal. 03/04/15   Dessa Phi, MD  Misc. Devices (CANE) MISC 1 each by Does not apply route as needed. 03/04/15   Dessa Phi, MD  promethazine (PHENERGAN) 25 MG tablet Take 1 tablet (25 mg total) by mouth every 6 (six) hours as needed for nausea or vomiting. 04/06/15   Mercedes Camprubi-Soms, PA-C  traMADol (ULTRAM) 50 MG tablet Take 1 tablet (50 mg total) by mouth every 8 (eight) hours as needed. Patient taking differently: Take 50 mg by mouth every 8 (eight) hours as needed (pain).  03/04/15   Dessa Phi, MD   Meds Ordered and Administered this Visit  Medications - No data to display  BP 167/91 mmHg  Pulse 73  Temp(Src) 97.9 F (36.6 C) (Oral)  Resp 16  SpO2 99% No data found.   Physical Exam  Constitutional: She is oriented to person, place, and time. She appears well-developed and well-nourished. No distress.  HENT:  Right Ear: External ear normal.  Left Ear: External ear normal.  Mouth/Throat: Oropharynx is clear and moist.  Eyes: Conjunctivae and EOM are normal. Pupils are equal, round, and reactive to light. Right eye exhibits no discharge. Left eye exhibits no discharge.    Neck: Normal range of motion. Neck supple.  Lymphadenopathy:    She has no cervical adenopathy.  Neurological: She is alert and oriented to person, place, and time.  Skin: Skin is warm and dry.  Nursing note and vitals reviewed.   ED Course  Procedures (including critical care time)  Labs Review Labs Reviewed - No data to  display  Imaging Review No results found.   Visual Acuity Review  Right Eye Distance:   Left Eye Distance:   Bilateral Distance:    Right Eye Near:   Left Eye Near:    Bilateral Near:         MDM  No diagnosis found. Meds ordered this encounter  Medications  . erythromycin ophthalmic ointment    Sig: Place into the right eye 3 (three) times daily.    Dispense:  3.5 g    Refill:  0  . cephALEXin (KEFLEX) 500 MG capsule    Sig: Take 1 capsule (500 mg total) by mouth 3 (three) times daily. Take all of medicine and drink lots of fluids    Dispense:  21 capsule    Refill:  0       Linna Hoff, MD 06/26/15 1724

## 2015-07-12 ENCOUNTER — Encounter: Payer: Self-pay | Admitting: Family Medicine

## 2015-07-12 ENCOUNTER — Other Ambulatory Visit: Payer: Self-pay | Admitting: Family Medicine

## 2015-07-12 ENCOUNTER — Ambulatory Visit: Payer: Medicaid Other | Attending: Family Medicine | Admitting: Family Medicine

## 2015-07-12 VITALS — BP 111/72 | HR 64 | Temp 98.7°F | Resp 16 | Ht 62.0 in | Wt 172.0 lb

## 2015-07-12 DIAGNOSIS — Z114 Encounter for screening for human immunodeficiency virus [HIV]: Secondary | ICD-10-CM

## 2015-07-12 DIAGNOSIS — M5410 Radiculopathy, site unspecified: Secondary | ICD-10-CM

## 2015-07-12 DIAGNOSIS — M5416 Radiculopathy, lumbar region: Secondary | ICD-10-CM

## 2015-07-12 DIAGNOSIS — Z79899 Other long term (current) drug therapy: Secondary | ICD-10-CM | POA: Diagnosis not present

## 2015-07-12 DIAGNOSIS — E119 Type 2 diabetes mellitus without complications: Secondary | ICD-10-CM | POA: Insufficient documentation

## 2015-07-12 DIAGNOSIS — E1169 Type 2 diabetes mellitus with other specified complication: Secondary | ICD-10-CM | POA: Diagnosis not present

## 2015-07-12 DIAGNOSIS — N939 Abnormal uterine and vaginal bleeding, unspecified: Secondary | ICD-10-CM | POA: Insufficient documentation

## 2015-07-12 DIAGNOSIS — M5010 Cervical disc disorder with radiculopathy, unspecified cervical region: Secondary | ICD-10-CM

## 2015-07-12 DIAGNOSIS — Z23 Encounter for immunization: Secondary | ICD-10-CM | POA: Diagnosis not present

## 2015-07-12 DIAGNOSIS — Z7984 Long term (current) use of oral hypoglycemic drugs: Secondary | ICD-10-CM | POA: Insufficient documentation

## 2015-07-12 DIAGNOSIS — M545 Low back pain: Secondary | ICD-10-CM | POA: Diagnosis not present

## 2015-07-12 DIAGNOSIS — Z90710 Acquired absence of both cervix and uterus: Secondary | ICD-10-CM | POA: Insufficient documentation

## 2015-07-12 DIAGNOSIS — M542 Cervicalgia: Secondary | ICD-10-CM | POA: Insufficient documentation

## 2015-07-12 DIAGNOSIS — Z1159 Encounter for screening for other viral diseases: Secondary | ICD-10-CM

## 2015-07-12 DIAGNOSIS — E785 Hyperlipidemia, unspecified: Secondary | ICD-10-CM

## 2015-07-12 DIAGNOSIS — Z0000 Encounter for general adult medical examination without abnormal findings: Secondary | ICD-10-CM

## 2015-07-12 DIAGNOSIS — Z1231 Encounter for screening mammogram for malignant neoplasm of breast: Secondary | ICD-10-CM

## 2015-07-12 DIAGNOSIS — G8929 Other chronic pain: Secondary | ICD-10-CM

## 2015-07-12 LAB — COMPLETE METABOLIC PANEL WITH GFR
ALBUMIN: 4.3 g/dL (ref 3.6–5.1)
ALK PHOS: 44 U/L (ref 33–130)
ALT: 29 U/L (ref 6–29)
AST: 22 U/L (ref 10–35)
BUN: 10 mg/dL (ref 7–25)
CALCIUM: 9.8 mg/dL (ref 8.6–10.4)
CO2: 27 mmol/L (ref 20–31)
Chloride: 101 mmol/L (ref 98–110)
Creat: 0.65 mg/dL (ref 0.50–1.05)
Glucose, Bld: 105 mg/dL — ABNORMAL HIGH (ref 65–99)
POTASSIUM: 4.9 mmol/L (ref 3.5–5.3)
Sodium: 139 mmol/L (ref 135–146)
Total Bilirubin: 0.9 mg/dL (ref 0.2–1.2)
Total Protein: 7.5 g/dL (ref 6.1–8.1)

## 2015-07-12 LAB — GLUCOSE, POCT (MANUAL RESULT ENTRY): POC Glucose: 111 mg/dl — AB (ref 70–99)

## 2015-07-12 LAB — LIPID PANEL
Cholesterol: 225 mg/dL — ABNORMAL HIGH (ref 125–200)
HDL: 47 mg/dL (ref >=46)
LDL Cholesterol: 164 mg/dL — ABNORMAL HIGH (ref <130)
TRIGLYCERIDES: 69 mg/dL (ref <150)
Total CHOL/HDL Ratio: 4.8 Ratio (ref <=5.0)
VLDL: 14 mg/dL (ref <30)

## 2015-07-12 LAB — POCT GLYCOSYLATED HEMOGLOBIN (HGB A1C): HEMOGLOBIN A1C: 5.9

## 2015-07-12 MED ORDER — TRAMADOL HCL 50 MG PO TABS: 50.0000 mg | ORAL_TABLET | Freq: Three times a day (TID) | ORAL | Status: DC | PRN

## 2015-07-12 MED ORDER — METFORMIN HCL 500 MG PO TABS: 500.0000 mg | ORAL_TABLET | Freq: Two times a day (BID) | ORAL | Status: DC

## 2015-07-12 NOTE — Assessment & Plan Note (Signed)
Chronic Unchanged Continue tramadol and ibuprogen PT

## 2015-07-12 NOTE — Assessment & Plan Note (Signed)
Suspect atrophy Pelvic exam at f/u

## 2015-07-12 NOTE — Assessment & Plan Note (Signed)
Well-controlled.  Continue current regimen. 
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
## 2015-07-12 NOTE — Progress Notes (Signed)
F/U DM, neck and back pain  Blood work  Pain scale #8 Taking medication as prescribed  No tobacco user  No suicidal thoughts in the past two weeks   Referral mammogram and optamology

## 2015-07-12 NOTE — Patient Instructions (Signed)
Jill Anderson was seen today for neck pain and diabetes.  Diagnoses and all orders for this visit:  Type 2 diabetes mellitus with other specified complication (HCC) -     Glucose (CBG) -     HgB A1c -     COMPLETE METABOLIC PANEL WITH GFR -     Lipid Panel -     metFORMIN (GLUCOPHAGE) 500 MG tablet; Take 1 tablet (500 mg total) by mouth 2 (two) times daily with a meal.  Healthcare maintenance -     MM DIGITAL SCREENING BILATERAL; Future  Cervical disc disorder with radiculopathy of cervical region -     traMADol (ULTRAM) 50 MG tablet; Take 1 tablet (50 mg total) by mouth every 8 (eight) hours as needed. -     Ambulatory referral to Physical Therapy  Chronic radicular low back pain -     traMADol (ULTRAM) 50 MG tablet; Take 1 tablet (50 mg total) by mouth every 8 (eight) hours as needed. -     Ambulatory referral to Physical Therapy  Chronic neck pain  Screening for HIV (human immunodeficiency virus) -     HIV antibody (with reflex)  Need for hepatitis C screening test -     Hepatitis C antibody, reflex  Other orders -     Pneumococcal polysaccharide vaccine 23-valent greater than or equal to 2yo subcutaneous/IM   F/u in 3-4 weeks for pelvic exam  F/u in 3 months for neck pain  F/u in 6 months for diabetes   Dr. Armen Pickup

## 2015-07-12 NOTE — Assessment & Plan Note (Signed)
Chronic Unchanged Continue tramadol and ibuprogen PT  

## 2015-07-13 LAB — HIV ANTIBODY (ROUTINE TESTING W REFLEX): HIV: NONREACTIVE

## 2015-07-13 LAB — HEPATITIS C ANTIBODY: HCV AB: NEGATIVE

## 2015-07-15 DIAGNOSIS — E1169 Type 2 diabetes mellitus with other specified complication: Secondary | ICD-10-CM | POA: Insufficient documentation

## 2015-07-15 DIAGNOSIS — E785 Hyperlipidemia, unspecified: Secondary | ICD-10-CM | POA: Insufficient documentation

## 2015-07-15 MED ORDER — ATORVASTATIN CALCIUM 40 MG PO TABS: 40.0000 mg | ORAL_TABLET | Freq: Every day | ORAL | Status: DC

## 2015-07-15 NOTE — Addendum Note (Signed)
Addended by: Dessa Phi on: 07/15/2015 08:45 AM   Modules accepted: Orders

## 2015-07-16 ENCOUNTER — Telehealth: Payer: Self-pay | Admitting: *Deleted

## 2015-07-16 ENCOUNTER — Telehealth: Payer: Self-pay

## 2015-07-16 NOTE — Telephone Encounter (Signed)
-----   Message from Josalyn Funches, MD sent at 07/15/2015  8:44 AM EST ----- Hep C and HIV screen negative Normal CMP Cholesterol is high in setting of diabetes, lipitor 40 mg daily ordered. If she develops muscles aches or cramps on lipitor the dose can be adjusted 

## 2015-07-16 NOTE — Telephone Encounter (Signed)
LVM to return call.

## 2015-07-16 NOTE — Telephone Encounter (Signed)
-----   Message from Dessa Phi, MD sent at 07/15/2015  8:44 AM EST ----- Hep C and HIV screen negative Normal CMP Cholesterol is high in setting of diabetes, lipitor 40 mg daily ordered. If she develops muscles aches or cramps on lipitor the dose can be adjusted

## 2015-07-24 ENCOUNTER — Ambulatory Visit
Admission: RE | Admit: 2015-07-24 | Discharge: 2015-07-24 | Disposition: A | Discharge status: Home / Self Care | Payer: Medicaid Other | Source: Ambulatory Visit | Attending: Family Medicine | Admitting: Family Medicine

## 2015-07-24 DIAGNOSIS — Z1231 Encounter for screening mammogram for malignant neoplasm of breast: Secondary | ICD-10-CM

## 2015-08-13 ENCOUNTER — Encounter: Payer: Self-pay | Admitting: Physical Therapy

## 2015-08-13 ENCOUNTER — Ambulatory Visit: Payer: Medicaid Other | Attending: Family Medicine | Admitting: Physical Therapy

## 2015-08-13 DIAGNOSIS — M5010 Cervical disc disorder with radiculopathy, unspecified cervical region: Secondary | ICD-10-CM | POA: Diagnosis not present

## 2015-08-13 DIAGNOSIS — M5416 Radiculopathy, lumbar region: Secondary | ICD-10-CM | POA: Diagnosis present

## 2015-08-13 NOTE — Therapy (Signed)
[facility] [address] [place], [place], [address] Phone: [phone]   Fax:  [phone]  Physical Therapy Evaluation  Patient Details  Name: Jill Anderson MRN: [id number] Date of Birth: [birth year] Referring Provider: Dessa PhiJosalyn Funches, MD  Encounter Date: 08/13/2015      PT End of Session - 08/13/15 1634    Visit Number 1   Number of Visits 1  due to insurance limitations   Authorization Type Medicaid   PT Start Time 1016   PT Stop Time 1100   PT Time Calculation (min) 44 min   Activity Tolerance Patient tolerated treatment well   Behavior [facility] for tasks assessed/performed      Past Medical History  Diagnosis Date  . Borderline diabetic     Past Surgical History  Procedure Laterality Date  . Abdominal hysterectomy      There were no vitals filed for this visit.  Visit Diagnosis:  Cervical disc disorder with radiculopathy of cervical region - Plan: PT plan of care cert/re-cert  Lumbar radiculopathy, chronic - Plan: PT plan of care cert/re-cert      Subjective Assessment - 08/13/15 1022    Subjective  Pt reports increased pain in neck radiating into arm (as distal as L hand), and increased lower back pain which radiates into both legs. Per pt, neurosurgeon is aware of pain and numbness in LUE and BLE's. Pt reports she has 3 herniated discs in neck and one herniated disc in L5-S1. This limits pt's ability to raise L arm, limits functional use of LUE. BLE symptoms limit walking tolerance and decrease pt ability to perform homemaking tasks.   Pertinent History cervical spine DDD; chronic LBP   Patient Stated Goals "This has been going on for a long time. I just want to feel better."   Currently in Pain? Yes   Pain Score 8    Pain Location Coccyx   Pain Orientation Right;Left   Pain Descriptors / Indicators Radiating   Pain Type Chronic pain   Pain Radiating Towards down bilateral legs    Pain Onset More than a month ago   Pain Frequency Constant   Aggravating Factors  rainy, cold weather and moving in general; staying in same position   Pain Relieving Factors lying down with a pillow between legs; moving every 20 minutes to half an hour   Effect of Pain on Daily Activities limits ADL's   Multiple Pain Sites Yes   Pain Score 9   Pain Location Neck   Pain Orientation Left   Pain Descriptors / Indicators Radiating   Pain Radiating Towards down L arm, into thoracic spine, and into base of skull   Pain Onset More than a month ago   Pain Frequency Constant   Aggravating Factors  moving neck; rainy, cold weather; lifting arms; and moving in general   Pain Relieving Factors ibuprofen or tramadol; cold modality of L shoulder and thoracic spine   Effect of Pain on Daily Activities limits ADL's            [facility] - 08/13/15 0001    Assessment   Medical Diagnosis Cervical disc disorder with radiculopathy of cervical region; chronic radicular low back pain   Referring Provider Dessa PhiJosalyn Funches, MD   Onset Date/Surgical Date 06/01/12   Precautions   Precautions None   Restrictions   Weight Bearing Restrictions No   Balance Screen   Has the patient fallen in the past 6 months No  Has the patient had a decrease in activity level because of a fear of falling?  No   Is the patient reluctant to leave their home because of a fear of falling?  No   Cognition   Overall Cognitive Status Within Functional Limits for tasks assessed   Observation/Other Assessments   Observations Increased radicular pain in BLE's with repeated thoracolumbar flexion (x3 consecutive reps); increased pain and symptoms in LUE with repeated cervical spine protraction/flexion, which is relieved with cervical spine retraction. Also noted excessive thoracolumbar rotation during bed mobility.    Sensation   Light Touch Impaired by gross assessment   Additional Comments Pt reports numbness in LUE,  which radiates as distally as L fingers 3-5. Pt reports intermittent numbness in posterior aspect of BLE's.   Coordination   Gross Motor Movements are Fluid and Coordinated Yes   Posture/Postural Control   Posture/Postural Control Postural limitations   Postural Limitations Rounded Shoulders;Forward head;Increased thoracic kyphosis   Posture Comments lower cervical spine flexion, upper cervical extension   ROM / Strength   AROM / PROM / Strength AROM;Strength   AROM   Overall AROM  Deficits;Unable to assess;Due to pain   Overall AROM Comments L shoulder flexion/ABD limited to grossly 80* due to pain.   Strength   Overall Strength Deficits   Overall Strength Comments RUE strength grossly WFL. Strength not formally assessed in LUE and BLE's due to pain with AROM.   Transfers   Transfers Sit to Stand;Stand to Sit   Sit to Stand 6: Modified independent (Device/Increase time);5: Supervision   Sit to Stand Details (indicate cue type and reason) (S), cueing for activation of core musculature during transitional movement.   Stand to Sit 6: Modified independent (Device/Increase time)   Ambulation/Gait   Ambulation/Gait Yes   Ambulation/Gait Assistance 6: Modified independent (Device/Increase time)  increased time, no device   Ambulation Distance (Feet) 75 Feet   Assistive device None   Ambulation Surface Level;Indoor                   [facility] Adult PT Treatment/Exercise - 08/13/15 0001    Bed Mobility   Bed Mobility Supine to Sit;Sit to Supine                PT Education - 08/13/15 1117    Education provided Yes   Education Details PT eval findings and home exercises. Explained limitation of insurance coverage to 1 therapy visit per year. Educated pt at length on anatomy of spine with emphasis on intervertebral discs, importance of avoiding motions/activities which increase pain and symptoms in extremeties. See Pt Instructions for details.    Person(s) Educated Patient    Methods Explanation;Demonstration;Verbal cues;Tactile cues;Handout   Comprehension Verbalized understanding;Returned demonstration  Pt verbally agreed to proceed given insurance limitations                    Plan - 08/13/15 1647    Clinical Impression Statement Pt is a 50 y/o F referred to skilled outpatient PT to address functional impairments associated with cervical and lumbar DDD. PT evaluation reveals postural impairments; limited and painful cervical spine and thoracolumbar spine AROM; increased radicular symptoms/pain in LUE with cervical spine protraction/flexion; increased radicular symptoms/pain in BLE's with thoracolumbar flexion. Pt educated on insurance coverage limitation to one PT evaluation per year. Pt verbalized understanding and expressed desire to proceed with PT assessment. Therefore, provided patient with home exercise program to assess/address postural dysfunction and to mitigate radicular  symptoms in LUE and BLE's. Pt verbalized understanding and gave effective return demo of HEP.   PT Frequency One time visit   Consulted and Agree with Plan of Care Patient         Problem List Patient Active Problem List   Diagnosis Date Noted  . Hyperlipidemia associated with type 2 diabetes mellitus (HCC) 07/15/2015  . Vaginal bleeding 07/12/2015  . DM2 (diabetes mellitus, type 2) (HCC) 03/04/2015  . Chronic neck pain 09/11/2014  . Cervical disc disorder with radiculopathy of cervical region 09/11/2014  . Chronic radicular low back pain 09/11/2014   Jorje Guild, PT, DPT [facility] [address] [place], [place], [address] Phone: [phone]   Fax:  [phone] 08/13/2015, 5:01 PM  Name: Jill Anderson MRN: [id number] Date of Birth: 05-06-1966

## 2015-08-13 NOTE — Patient Instructions (Addendum)
  Standing Cobra Pose (Wall)    Stand facing wall; both hands on wall. Staggered feet. Bring hips to wall. Hold 5 seconds. Pain in legs should decrease. Hold for __3-5__ breaths. Repeat __10__ times.  PELVIC TILT: Posterior    Tighten abdominals, flatten low back against bed. Hold 5 seconds. Repeat 10 times. 2 sets per day.    Bracing With Bridging (Hook-Lying)    With neutral spine, tighten pelvic floor and abdominals and hold. Lift bottom. Repeat _8__ times. Do _2__ times a day.  Chin Tuck Exercise with Towel Roll  Neck Stretch  -Fold up a towel and position yourself on your back -Place towel at the base of your skull and relax your neck -Tuck your chin. Hold for 5 seconds. Perform 10 reps, 1-2 times per day.  Levator Scapula Stretch, Sitting - LEFT    Sit, with left hand on chair/bed, right hand on  top of head. Turn head toward RIGHT side and look down. Use hand on head to gently stretch neck in that position. Hold _30__ seconds. Repeat __4_ times per day.  Copyright  VHI. All rights reserved.   NECK TENSION: Assisted Stretch - LEFT    Reach right arm around head and hold slightly above ear. Gently bring right ear toward right shoulder. Hold position for _30__ breaths. Repeat with other arm. Repeat __4_ times stretching the LEFT arm only.  Copyright  VHI. All rights reserved.     Dung, here are a few things we talked about today: - Avoid movements that increase the radiating pain in you legs and left arm. Today, flexing your back (knees to chest) increased your leg pain and flexing your neck (chin to chest) increased your left arm pain and numbness. Try to move opposite of these positions (e.g. "chin tuck" and arching your back) - I'm giving you a stockinette with 2 tennis balls. Place this at the base of your skull while lying on you back for 3-5 minutes at a time, as tolerated. This should improve your posture and your headache pain.

## 2015-10-02 ENCOUNTER — Encounter (HOSPITAL_COMMUNITY): Payer: Self-pay | Admitting: Emergency Medicine

## 2015-10-02 ENCOUNTER — Emergency Department (HOSPITAL_COMMUNITY)
Admission: EM | Admit: 2015-10-02 | Discharge: 2015-10-02 | Disposition: A | Discharge status: Home / Self Care | Payer: Medicaid Other | Attending: Emergency Medicine | Admitting: Emergency Medicine

## 2015-10-02 DIAGNOSIS — Z7984 Long term (current) use of oral hypoglycemic drugs: Secondary | ICD-10-CM | POA: Diagnosis not present

## 2015-10-02 DIAGNOSIS — M5412 Radiculopathy, cervical region: Secondary | ICD-10-CM | POA: Diagnosis not present

## 2015-10-02 DIAGNOSIS — M5416 Radiculopathy, lumbar region: Secondary | ICD-10-CM | POA: Insufficient documentation

## 2015-10-02 DIAGNOSIS — R51 Headache: Secondary | ICD-10-CM | POA: Diagnosis not present

## 2015-10-02 DIAGNOSIS — Z79899 Other long term (current) drug therapy: Secondary | ICD-10-CM | POA: Insufficient documentation

## 2015-10-02 DIAGNOSIS — R519 Headache, unspecified: Secondary | ICD-10-CM

## 2015-10-02 DIAGNOSIS — E119 Type 2 diabetes mellitus without complications: Secondary | ICD-10-CM | POA: Diagnosis not present

## 2015-10-02 DIAGNOSIS — M542 Cervicalgia: Secondary | ICD-10-CM | POA: Diagnosis present

## 2015-10-02 HISTORY — DX: Type 2 diabetes mellitus without complications: E11.9

## 2015-10-02 LAB — SEDIMENTATION RATE: SED RATE: 10 mm/h (ref 0–22)

## 2015-10-02 MED ORDER — IBUPROFEN 800 MG PO TABS: 800.0000 mg | ORAL_TABLET | Freq: Three times a day (TID) | ORAL | Status: DC

## 2015-10-02 MED ORDER — PREDNISONE 20 MG PO TABS
60.0000 mg | ORAL_TABLET | Freq: Once | ORAL | Status: AC
  Administered 2015-10-02: 60 mg via ORAL
  Filled 2015-10-02: qty 3

## 2015-10-02 MED ORDER — TRAMADOL HCL 50 MG PO TABS: 50.0000 mg | ORAL_TABLET | Freq: Four times a day (QID) | ORAL | Status: DC | PRN

## 2015-10-02 MED ORDER — PREDNISONE 20 MG PO TABS: ORAL_TABLET | ORAL | Status: DC

## 2015-10-02 MED ORDER — KETOROLAC TROMETHAMINE 60 MG/2ML IM SOLN
60.0000 mg | Freq: Once | INTRAMUSCULAR | Status: AC
  Administered 2015-10-02: 60 mg via INTRAMUSCULAR
  Filled 2015-10-02: qty 2

## 2015-10-02 NOTE — ED Provider Notes (Signed)
CSN: [id number]     Arrival date & time 10/02/15  0807 History   First MD Initiated Contact with Patient 10/02/15 [phone]     Chief Complaint  Patient presents with  . Neck Pain  . Back Pain     (Consider location/radiation/quality/duration/timing/severity/associated sxs/prior Treatment) HPI Comments: Patient with known history of cervical and lumbar disc bulges causing left-sided radiculopathy in her arm and leg presents with worsening of her chronic pain over the past 1 week. Patient does not have any new injuries. Pain is severe compared to baseline. She describes pain that radiates from her left neck and her left shoulder. She states that she has trouble holding a plate with her hand at times. She also has pain extending from the coccygeal area to the left hip. It is difficult for her to stand upright or walk because of the pain in her back and leg. Patient has taken ibuprofen 800 mg at home which allowed her to sleep but did not resolve her pain. Patient denies warning symptoms of back pain including: fecal incontinence, urinary retention or overflow incontinence, night sweats, waking from sleep with back pain, unexplained fevers, h/o cancer, IVDU, recent trauma. She states that she has lost 10 lbs over the past year.   Patient also describes 2 instances of sharp left temporal facial pain and muscle fasciculations in her left face over the past week. She states that she has continued tenderness in her left temporal area. She has had some blurry vision but no vision loss. She is concerned as to why she is getting headaches and whether or not this is related to her neck symptoms.   Patient is a 50 y.o. female presenting with neck pain and back pain. The history is provided by the patient and medical records.  Neck Pain Associated symptoms: headaches and weakness   Associated symptoms: no chest pain, no fever and no numbness   Back Pain Associated symptoms: headaches and weakness   Associated  symptoms: no abdominal pain, no chest pain, no dysuria, no fever, no numbness and no pelvic pain     Past Medical History  Diagnosis Date  . Borderline diabetic   . Diabetes mellitus without complication [facility])    Past Surgical History  Procedure Laterality Date  . Abdominal hysterectomy     Family History  Problem Relation Age of Onset  . Diabetes Mother   . Hypertension Mother   . Heart disease Mother   . Diabetes Father   . Hypertension Father   . Heart disease Father   . Cancer Maternal Aunt     liver   Social History  Substance Use Topics  . Smoking status: Never Smoker   . Smokeless tobacco: Never Used  . Alcohol Use: No   OB History    No data available     Review of Systems  Constitutional: Negative for fever and unexpected weight change.  HENT: Negative for rhinorrhea and sore throat.   Eyes: Negative for redness and visual disturbance.  Respiratory: Negative for cough.   Cardiovascular: Negative for chest pain.  Gastrointestinal: Negative for nausea, vomiting, abdominal pain, diarrhea and constipation.       Negative for fecal incontinence.   Genitourinary: Negative for dysuria, hematuria, flank pain, vaginal bleeding, vaginal discharge and pelvic pain.       Negative for urinary incontinence or retention.  Musculoskeletal: Positive for back pain, gait problem and neck pain. Negative for myalgias.  Skin: Negative for rash.  Neurological: Positive for  weakness and headaches. Negative for numbness.       Denies saddle paresthesias.    Allergies  Review of patient's allergies indicates no known allergies.  Home Medications   Prior to Admission medications   Medication Sig Start Date End Date Taking? Authorizing Provider  ibuprofen (ADVIL,MOTRIN) 800 MG tablet Take 1 tablet (800 mg total) by mouth every 8 (eight) hours as needed. Patient taking differently: Take 800 mg by mouth every 8 (eight) hours as needed (pain).  03/04/15  Yes Boykin Nearing, MD   metFORMIN (GLUCOPHAGE) 500 MG tablet Take 1 tablet (500 mg total) by mouth 2 (two) times daily with a meal. 07/12/15  Yes Boykin Nearing, MD  Misc. Devices (CANE) MISC 1 each by Does not apply route as needed. 03/04/15  Yes Josalyn Funches, MD  atorvastatin (LIPITOR) 40 MG tablet Take 1 tablet (40 mg total) by mouth daily. 07/15/15   Josalyn Funches, MD  traMADol (ULTRAM) 50 MG tablet Take 1 tablet (50 mg total) by mouth every 8 (eight) hours as needed. 07/12/15   Josalyn Funches, MD   BP 126/72 mmHg  Pulse 63  Temp(Src) 97.7 F (36.5 C) (Oral)  Resp 18  Ht 5' 2"  (1.575 m)  Wt 78.926 kg  BMI 31.82 kg/m2  SpO2 99%   Physical Exam  Constitutional: She is oriented to person, place, and time. She appears well-developed and well-nourished.  HENT:  Head: Normocephalic and atraumatic.  Right Ear: Tympanic membrane, external ear and ear canal normal.  Left Ear: Tympanic membrane, external ear and ear canal normal.  Nose: Nose normal.  Mouth/Throat: Uvula is midline, oropharynx is clear and moist and mucous membranes are normal.  Tenderness noted to palpation over L temporal area. No visible skin changes.   Eyes: Conjunctivae, EOM and lids are normal. Pupils are equal, round, and reactive to light. Right eye exhibits no discharge. Left eye exhibits no discharge. Right eye exhibits no nystagmus. Left eye exhibits no nystagmus.  Neck: Normal range of motion. Neck supple.  Cardiovascular: Normal rate, regular rhythm and normal heart sounds.   Pulmonary/Chest: Effort normal and breath sounds normal.  Abdominal: Soft. There is no tenderness. There is no CVA tenderness.  Musculoskeletal: She exhibits tenderness.       Right hip: Normal.       Left hip: Normal.       Cervical back: She exhibits tenderness (Left paraspinous). She exhibits normal range of motion and no bony tenderness.       Thoracic back: She exhibits normal range of motion, no tenderness and no bony tenderness.       Lumbar back:  She exhibits tenderness (Left paraspinous). She exhibits normal range of motion and no bony tenderness.  No step-off noted with palpation of spine.   Neurological: She is alert and oriented to person, place, and time. She has normal strength and normal reflexes. No cranial nerve deficit or sensory deficit. She displays a negative Romberg sign. Coordination and gait normal. GCS eye subscore is 4. GCS verbal subscore is 5. GCS motor subscore is 6.  5/5 strength in entire lower extremities bilaterally. She can feel me touching both left and right ankles but states decreased sensation in left compared to right. Patient has antalgic gait but can stand upright and walk without foot drop. She does so slowly.  Skin: Skin is warm and dry. No rash noted.  Psychiatric: She has a normal mood and affect.  Nursing note and vitals reviewed.   ED Course  Procedures (including critical care time) Labs Review Labs Reviewed  SEDIMENTATION RATE    8:33 AM Patient seen and examined. Medications ordered.   Vital signs reviewed and are as follows: BP 126/72 mmHg  Pulse 63  Temp(Src) 97.7 F (36.5 C) (Oral)  Resp 18  Ht 5' 2"  (1.575 m)  Wt 78.926 kg  BMI 31.82 kg/m2  SpO2 99%  Back pain and neck pain: Pain is the same as her chronic symptoms without differences today, however more severe than baseline. Will attempt to control pain with IM Toradol in emergency department and tramadol for home. She will also be started on prednisone. Patient is aware that this may increase her blood sugar level. She is told to discontinue the prednisone if she checks her blood sugar and is greater than 250. She is told to check this several times a day while she is on this medication. No red flag signs and symptoms of back pain. Do not feel that emergent MRI or neurosurgical consultation is necessary at this time.  Patient counseled on use of narcotic pain medications. Counseled not to combine these medications with others  containing tylenol. Urged not to drink alcohol, drive, or perform any other activities that requires focus while taking these medications. The patient verbalizes understanding and agrees with the plan.   Headache: Unclear etiology. May be referred from cervical spine, however with temporal tenderness and age greater than 71, will send ESR to screen for giant cell arteritis. Prednisone should also help this if it were present. Patient has PCP follow-up on Monday (5 days from now). PCP can follow up on this result at this time and treat appropriately.  MDM   Final diagnoses:  Cervical radiculopathy  Lumbar radiculopathy  Facial pain   Discussion as above. Symptoms are the same as chronic symptoms. No signs of new central cord compression. No signs of meningitis.    Carlisle Cater, PA-C 10/02/15 [place], MD 10/02/15 [phone]

## 2015-10-02 NOTE — Discharge Instructions (Signed)
Please read and follow all provided instructions.  Your diagnoses today include:  1. Cervical radiculopathy   2. Lumbar radiculopathy   3. Facial pain    Tests performed today include:  Vital signs - see below for your results today  Sed rate - this is a screening test for temporal arteritis, please have your doctor check on this result for you at your next appointment  Medications prescribed:   Tramadol - narcotic-like pain medication  DO NOT drive or perform any activities that require you to be awake and alert because this medicine can make you drowsy.    Prednisone - steroid medicine   It is best to take this medication in the morning to prevent sleeping problems. If you are diabetic, monitor your blood sugar closely and stop taking Prednisone if blood sugar is over 250. Take with food to prevent stomach upset.    Ibuprofen (Motrin, Advil) - anti-inflammatory pain medication  Do not exceed 600mg  ibuprofen every 6 hours, take with food  You have been prescribed an anti-inflammatory medication or NSAID. Take with food. Take smallest effective dose for the shortest duration needed for your pain. Stop taking if you experience stomach pain or vomiting.   Take any prescribed medications only as directed.  Home care instructions:   Follow any educational materials contained in this packet  Please rest, use ice or heat on your back for the next several days  Do not lift, push, pull anything more than 10 pounds for the next week  Follow-up instructions: Please follow-up with your primary care provider in the next 1 week for further evaluation of your symptoms.   Return instructions:  SEEK IMMEDIATE MEDICAL ATTENTION IF YOU HAVE:  New numbness, tingling, weakness, or problem with the use of your arms or legs  Severe back pain not relieved with medications  Loss control of your bowels or bladder  Increasing pain in any areas of the body (such as chest or abdominal  pain)  Shortness of breath, dizziness, or fainting.   Worsening nausea (feeling sick to your stomach), vomiting, fever, or sweats  Any other emergent concerns regarding your health   Additional Information:  Your vital signs today were: BP 126/72 mmHg   Pulse 63   Temp(Src) 97.7 F (36.5 C) (Oral)   Resp 18   Ht 5\' 2"  (1.575 m)   Wt 78.926 kg   BMI 31.82 kg/m2   SpO2 99% If your blood pressure (BP) was elevated above 135/85 this visit, please have this repeated by your doctor within one month. --------------

## 2015-10-02 NOTE — ED Notes (Signed)
Patient states history of herniated discs.   Patient complains of chronic neck and back pain that has worsened in the last week.   Patient states pain radiates from neck into L shoulder and from back to L buttock/hip.   Patient states took ibuprofen at home and did make an appointment with her primary for Monday.

## 2015-10-07 ENCOUNTER — Encounter: Payer: Self-pay | Admitting: Family Medicine

## 2015-10-07 ENCOUNTER — Ambulatory Visit: Payer: Medicaid Other | Attending: Family Medicine | Admitting: Family Medicine

## 2015-10-07 VITALS — BP 119/69 | HR 81 | Temp 98.3°F | Resp 16 | Ht 62.0 in | Wt 173.0 lb

## 2015-10-07 DIAGNOSIS — M545 Low back pain: Secondary | ICD-10-CM | POA: Insufficient documentation

## 2015-10-07 DIAGNOSIS — G8929 Other chronic pain: Secondary | ICD-10-CM | POA: Diagnosis not present

## 2015-10-07 DIAGNOSIS — M5410 Radiculopathy, site unspecified: Secondary | ICD-10-CM

## 2015-10-07 DIAGNOSIS — M5010 Cervical disc disorder with radiculopathy, unspecified cervical region: Secondary | ICD-10-CM | POA: Diagnosis not present

## 2015-10-07 DIAGNOSIS — E119 Type 2 diabetes mellitus without complications: Secondary | ICD-10-CM | POA: Diagnosis not present

## 2015-10-07 DIAGNOSIS — Z7984 Long term (current) use of oral hypoglycemic drugs: Secondary | ICD-10-CM | POA: Insufficient documentation

## 2015-10-07 DIAGNOSIS — M5416 Radiculopathy, lumbar region: Secondary | ICD-10-CM

## 2015-10-07 DIAGNOSIS — M542 Cervicalgia: Secondary | ICD-10-CM | POA: Diagnosis not present

## 2015-10-07 DIAGNOSIS — Z79899 Other long term (current) drug therapy: Secondary | ICD-10-CM | POA: Insufficient documentation

## 2015-10-07 DIAGNOSIS — R51 Headache: Secondary | ICD-10-CM

## 2015-10-07 DIAGNOSIS — R519 Headache, unspecified: Secondary | ICD-10-CM

## 2015-10-07 DIAGNOSIS — E1169 Type 2 diabetes mellitus with other specified complication: Secondary | ICD-10-CM | POA: Diagnosis not present

## 2015-10-07 LAB — GLUCOSE, POCT (MANUAL RESULT ENTRY)

## 2015-10-07 LAB — POCT GLYCOSYLATED HEMOGLOBIN (HGB A1C): HEMOGLOBIN A1C: 6.2

## 2015-10-07 MED ORDER — ASPIRIN EC 325 MG PO TBEC
325.0000 mg | DELAYED_RELEASE_TABLET | Freq: Every day | ORAL | Status: DC
Start: 2015-10-07 — End: 2016-09-29

## 2015-10-07 MED ORDER — TRAMADOL HCL 50 MG PO TABS: 50.0000 mg | ORAL_TABLET | Freq: Four times a day (QID) | ORAL | Status: DC | PRN

## 2015-10-07 MED ORDER — PREGABALIN 75 MG PO CAPS: 75.0000 mg | ORAL_CAPSULE | Freq: Two times a day (BID) | ORAL | Status: DC

## 2015-10-07 NOTE — Assessment & Plan Note (Signed)
New onset headache: Ddx temporal arteritis, trigeminal neuralgia Normal sed rate  Plan; MRI of head Carotid dopplers given report of carotid artery disease Consider carbamazepine if headache becomes resistant to ibuprofen

## 2015-10-07 NOTE — Addendum Note (Signed)
Addended by: Dessa PhiFUNCHES, Farrell Pantaleo on: 10/07/2015 02:32 PM   Modules accepted: Orders

## 2015-10-07 NOTE — Assessment & Plan Note (Signed)
Chronic pain with radicular symptoms Referral back to neurosurg Will pursue lyrica as she has tried gabapentin without relief

## 2015-10-07 NOTE — Progress Notes (Signed)
HFU back pain, neck and HA HA, sharp pain on rt temple. Pain scale #8 No tobacco user  No suicidal thoughts in the past two weeks

## 2015-10-07 NOTE — Patient Instructions (Addendum)
Jill Anderson was seen today for neck pain.  Diagnoses and all orders for this visit:  Type 2 diabetes mellitus with other specified complication (HCC) -     POCT glycosylated hemoglobin (Hb A1C) -     POCT glucose (manual entry)  Chronic radicular low back pain -     Ambulatory referral to Neurosurgery -     traMADol (ULTRAM) 50 MG tablet; Take 1 tablet (50 mg total) by mouth every 6 (six) hours as needed. -     pregabalin (LYRICA) 75 MG capsule; Take 1 capsule (75 mg total) by mouth 2 (two) times daily.  Chronic neck pain -     Ambulatory referral to Neurosurgery -     traMADol (ULTRAM) 50 MG tablet; Take 1 tablet (50 mg total) by mouth every 6 (six) hours as needed. -     pregabalin (LYRICA) 75 MG capsule; Take 1 capsule (75 mg total) by mouth 2 (two) times daily.  Cervical disc disorder with radiculopathy of cervical region -     Ambulatory referral to Neurosurgery -     traMADol (ULTRAM) 50 MG tablet; Take 1 tablet (50 mg total) by mouth every 6 (six) hours as needed. -     pregabalin (LYRICA) 75 MG capsule; Take 1 capsule (75 mg total) by mouth 2 (two) times daily.  New onset of headaches after age 25 -     MR MRA Head/Brain Wo Cm; Future -     US Carotid Duplex Bilateral; Future  Other orders -     aspirin EC 325 MG tablet; Take 1 tablet (325 mg total) by mouth daily.   F/u in 4 weeks for new onset  headache   Dr. Armen Pickup  Trigeminal Neuralgia Trigeminal neuralgia is a nerve disorder that causes attacks of severe facial pain. The attacks last from a few seconds to several minutes. They can happen for days, weeks, or months and then go away for months or years. Trigeminal neuralgia is also called tic douloureux. CAUSES This condition is caused by damage to a nerve in the face that is called the trigeminal nerve. An attack can be triggered by:  Talking.  Chewing.  Putting on makeup.  Washing your face.  Shaving your face.  Brushing your teeth.  Touching your  face. RISK FACTORS This condition is more likely to develop in:  Women.  People who are 2 years of age or older. SYMPTOMS The main symptom of this condition is pain in the jaw, lips, eyes, nose, scalp, forehead, and face. The pain may be intense, stabbing, electric, or shock-like. DIAGNOSIS This condition is diagnosed with a physical exam. A CT scan or MRI may be done to rule out other conditions that can cause facial pain. TREATMENT This condition may be treated with:  Avoiding the things that trigger your attacks.  Pain medicine.  Surgery. This may be done in severe cases if other medical treatment does not provide relief. HOME CARE INSTRUCTIONS  Take over-the-counter and prescription medicines only as told by your health care provider.  If you wish to get pregnant, talk with your health care provider before you start trying to get pregnant.  Avoid the things that trigger your attacks. It may help to:  Chew on the unaffected side of your mouth.  Avoid touching your face.  Avoid blasts of hot or cold air. SEEK MEDICAL CARE IF:  Your pain medicine is not helping.  You develop new, unexplained symptoms, such as:  Double vision.  Facial  weakness.  Changes in hearing or balance.  You become pregnant. SEEK IMMEDIATE MEDICAL CARE IF:  Your pain is unbearable, and your pain medicine does not help.   This information is not intended to replace advice given to you by your health care provider. Make sure you discuss any questions you have with your health care provider.   Document Released: 05/15/2000 Document Revised: 02/06/2015 Document Reviewed: 09/10/2014 Elsevier Interactive Patient Education [employer]2016 Elsevier Inc.
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
## 2015-10-08 ENCOUNTER — Telehealth: Payer: Self-pay | Admitting: Family Medicine

## 2015-10-08 NOTE — Telephone Encounter (Signed)
I cannot help with this... Will defer to Dr. Armen PickupFunches and Francena HanlyStella.

## 2015-10-08 NOTE — Telephone Encounter (Signed)
Pam from pre-cert services called to request Pre-Authorization for patient's visit tomorrow.  Please send pre-auth before 2pm today otherwise appointment will be canceled...  [phone]

## 2015-10-08 NOTE — Telephone Encounter (Signed)
Record faxed.

## 2015-10-08 NOTE — Telephone Encounter (Signed)
Patient's insurance requesting clinical documentation for MRI Please fax yesterdays OV note and patient's last ED note Cancel MRI for now pending response from her insurance

## 2015-10-09 ENCOUNTER — Other Ambulatory Visit: Payer: Self-pay | Admitting: Family Medicine

## 2015-10-09 ENCOUNTER — Encounter: Payer: Self-pay | Admitting: Pharmacist

## 2015-10-09 ENCOUNTER — Ambulatory Visit (HOSPITAL_COMMUNITY): Admission: RE | Admit: 2015-10-09 | Payer: Medicaid Other | Source: Ambulatory Visit

## 2015-10-09 ENCOUNTER — Ambulatory Visit (HOSPITAL_COMMUNITY)
Admission: RE | Admit: 2015-10-09 | Discharge: 2015-10-09 | Disposition: A | Discharge status: Home / Self Care | Payer: Medicaid Other | Source: Ambulatory Visit | Attending: Family Medicine | Admitting: Family Medicine

## 2015-10-09 DIAGNOSIS — R519 Headache, unspecified: Secondary | ICD-10-CM

## 2015-10-09 DIAGNOSIS — I6621 Occlusion and stenosis of right posterior cerebral artery: Secondary | ICD-10-CM | POA: Insufficient documentation

## 2015-10-09 DIAGNOSIS — R51 Headache: Secondary | ICD-10-CM | POA: Diagnosis not present

## 2015-10-09 NOTE — Progress Notes (Signed)
Prior Authorization for Lyrica completed through [employer] and was approved.  Approval #: [id number]

## 2015-10-13 ENCOUNTER — Encounter (HOSPITAL_COMMUNITY): Payer: Self-pay | Admitting: Emergency Medicine

## 2015-10-13 ENCOUNTER — Emergency Department (HOSPITAL_COMMUNITY): Payer: Medicaid Other

## 2015-10-13 ENCOUNTER — Emergency Department (HOSPITAL_COMMUNITY)
Admission: EM | Admit: 2015-10-13 | Discharge: 2015-10-14 | Disposition: A | Discharge status: Home / Self Care | Payer: Medicaid Other | Attending: Emergency Medicine | Admitting: Emergency Medicine

## 2015-10-13 DIAGNOSIS — N200 Calculus of kidney: Secondary | ICD-10-CM | POA: Diagnosis not present

## 2015-10-13 DIAGNOSIS — Z90710 Acquired absence of both cervix and uterus: Secondary | ICD-10-CM | POA: Insufficient documentation

## 2015-10-13 DIAGNOSIS — Z7982 Long term (current) use of aspirin: Secondary | ICD-10-CM | POA: Insufficient documentation

## 2015-10-13 DIAGNOSIS — R1032 Left lower quadrant pain: Secondary | ICD-10-CM | POA: Diagnosis present

## 2015-10-13 DIAGNOSIS — Z7984 Long term (current) use of oral hypoglycemic drugs: Secondary | ICD-10-CM | POA: Insufficient documentation

## 2015-10-13 DIAGNOSIS — E119 Type 2 diabetes mellitus without complications: Secondary | ICD-10-CM | POA: Insufficient documentation

## 2015-10-13 LAB — COMPREHENSIVE METABOLIC PANEL
ALT: 33 U/L (ref 14–54)
ANION GAP: 12 (ref 5–15)
AST: 25 U/L (ref 15–41)
Albumin: 3.8 g/dL (ref 3.5–5.0)
Alkaline Phosphatase: 43 U/L (ref 38–126)
BILIRUBIN TOTAL: 1 mg/dL (ref 0.3–1.2)
BUN: 10 mg/dL (ref 6–20)
CO2: 23 mmol/L (ref 22–32)
Calcium: 9.6 mg/dL (ref 8.9–10.3)
Chloride: 103 mmol/L (ref 101–111)
Creatinine, Ser: 0.73 mg/dL (ref 0.44–1.00)
Glucose, Bld: 125 mg/dL — ABNORMAL HIGH (ref 65–99)
POTASSIUM: 3.7 mmol/L (ref 3.5–5.1)
Sodium: 138 mmol/L (ref 135–145)
TOTAL PROTEIN: 7.3 g/dL (ref 6.5–8.1)

## 2015-10-13 LAB — URINALYSIS, ROUTINE W REFLEX MICROSCOPIC
Bilirubin Urine: NEGATIVE
GLUCOSE, UA: NEGATIVE mg/dL
Ketones, ur: NEGATIVE mg/dL
LEUKOCYTES UA: NEGATIVE
NITRITE: NEGATIVE
PH: 5.5 (ref 5.0–8.0)
Protein, ur: NEGATIVE mg/dL
SPECIFIC GRAVITY, URINE: 1.023 (ref 1.005–1.030)

## 2015-10-13 LAB — CBC
HEMATOCRIT: 43.4 % (ref 36.0–46.0)
Hemoglobin: 14.3 g/dL (ref 12.0–15.0)
MCH: 30.3 pg (ref 26.0–34.0)
MCHC: 32.9 g/dL (ref 30.0–36.0)
MCV: 91.9 fL (ref 78.0–100.0)
Platelets: 308 10*3/uL (ref 150–400)
RBC: 4.72 MIL/uL (ref 3.87–5.11)
RDW: 11.9 % (ref 11.5–15.5)
WBC: 7.9 10*3/uL (ref 4.0–10.5)

## 2015-10-13 LAB — URINE MICROSCOPIC-ADD ON

## 2015-10-13 LAB — LIPASE, BLOOD: Lipase: 53 U/L — ABNORMAL HIGH (ref 11–51)

## 2015-10-13 MED ORDER — MORPHINE SULFATE (PF) 4 MG/ML IV SOLN
4.0000 mg | Freq: Once | INTRAVENOUS | Status: AC
  Administered 2015-10-13: 4 mg via INTRAVENOUS
  Filled 2015-10-13: qty 1

## 2015-10-13 MED ORDER — ONDANSETRON 4 MG PO TBDP: 4.0000 mg | ORAL_TABLET | ORAL | Status: DC | PRN

## 2015-10-13 MED ORDER — IBUPROFEN 600 MG PO TABS: 600.0000 mg | ORAL_TABLET | Freq: Four times a day (QID) | ORAL | Status: DC | PRN

## 2015-10-13 MED ORDER — TAMSULOSIN HCL 0.4 MG PO CAPS: 0.4000 mg | ORAL_CAPSULE | Freq: Every day | ORAL | Status: DC

## 2015-10-13 MED ORDER — HYDROCODONE-ACETAMINOPHEN 5-325 MG PO TABS: 1.0000 | ORAL_TABLET | ORAL | Status: DC | PRN

## 2015-10-13 MED ORDER — KETOROLAC TROMETHAMINE 30 MG/ML IJ SOLN
30.0000 mg | Freq: Once | INTRAMUSCULAR | Status: AC
  Administered 2015-10-13: 30 mg via INTRAVENOUS
  Filled 2015-10-13: qty 1

## 2015-10-13 NOTE — ED Notes (Signed)
Pt c/o pain in  L pelvic area for a week, pt states yesterday she urinated blood. Pt had hysterectomy. Pt describs the pain as sharp. Pain travels around to her L flank. Pt in NAD. Pain 7/10. Denies n/v/d. Pt denies fevers, chills.

## 2015-10-13 NOTE — ED Provider Notes (Signed)
CSN: [id number]     Arrival date & time 10/13/15  1715 History   First MD Initiated Contact with Patient 10/13/15 1939     Chief Complaint  Patient presents with  . Flank Pain  . Hematuria     (Consider location/radiation/quality/duration/timing/severity/associated sxs/prior Treatment) HPI Patient states that she has been having lower back and left flank pain for about a week. The pain is sharp. She states yesterday evening she saw blood in her urine. She also reports her blood in her urine today. She denies pain burning or urgency with urination. She reports distant history of kidney stone. Patient has had a total abdominal hysterectomy. He denies any vaginal bleeding. She does report some radiation of pain into her left leg. No weakness or numbness of the leg. No fevers or chills. No nausea vomiting or diarrhea. Past Medical History  Diagnosis Date  . Borderline diabetic   . Diabetes mellitus without complication [facility])    Past Surgical History  Procedure Laterality Date  . Abdominal hysterectomy    . Back surgery     Family History  Problem Relation Age of Onset  . Diabetes Mother   . Hypertension Mother   . Heart disease Mother   . Diabetes Father   . Hypertension Father   . Heart disease Father   . Cancer Maternal Aunt     liver   Social History  Substance Use Topics  . Smoking status: Never Smoker   . Smokeless tobacco: Never Used  . Alcohol Use: No   OB History    No data available     Review of Systems 10 Systems reviewed and are negative for acute change except as noted in the HPI.    Allergies  Review of patient's allergies indicates no known allergies.  Home Medications   Prior to Admission medications   Medication Sig Start Date End Date Taking? Authorizing Provider  aspirin EC 325 MG tablet Take 1 tablet (325 mg total) by mouth daily. 10/07/15  Yes Josalyn Funches, MD  ibuprofen (ADVIL,MOTRIN) 200 MG tablet Take 400 mg by mouth every 6 (six) hours as  needed for moderate pain.   Yes Historical Provider, MD  metFORMIN (GLUCOPHAGE) 500 MG tablet Take 1 tablet (500 mg total) by mouth 2 (two) times daily with a meal. 07/12/15  Yes Josalyn Funches, MD  atorvastatin (LIPITOR) 40 MG tablet Take 1 tablet (40 mg total) by mouth daily. Patient not taking: Reported on 10/13/2015 07/15/15   Dessa Phi, MD  HYDROcodone-acetaminophen (NORCO/VICODIN) 5-325 MG tablet Take 1-2 tablets by mouth every 4 (four) hours as needed for moderate pain or severe pain. 10/13/15   Arby Barrette, MD  ibuprofen (ADVIL,MOTRIN) 600 MG tablet Take 1 tablet (600 mg total) by mouth every 6 (six) hours as needed. 10/13/15   Arby Barrette, MD  ibuprofen (ADVIL,MOTRIN) 800 MG tablet Take 1 tablet (800 mg total) by mouth 3 (three) times daily. 10/02/15   Renne Crigler, PA-C  Misc. Devices (CANE) MISC 1 each by Does not apply route as needed. 03/04/15   Josalyn Funches, MD  ondansetron (ZOFRAN ODT) 4 MG disintegrating tablet Take 1 tablet (4 mg total) by mouth every 4 (four) hours as needed for nausea or vomiting. 10/13/15   Arby Barrette, MD  pregabalin (LYRICA) 75 MG capsule Take 1 capsule (75 mg total) by mouth 2 (two) times daily. Patient not taking: Reported on 10/13/2015 10/07/15   Dessa Phi, MD  tamsulosin (FLOMAX) 0.4 MG CAPS capsule Take 1 capsule (  0.4 mg total) by mouth daily. 10/13/15   Arby Barrette, MD  traMADol (ULTRAM) 50 MG tablet Take 1 tablet (50 mg total) by mouth every 6 (six) hours as needed. Patient not taking: Reported on 10/13/2015 10/07/15   Josalyn Funches, MD   BP 124/74 mmHg  Pulse 87  Temp(Src) 98.7 F (37.1 C) (Oral)  Resp 16  SpO2 95% Physical Exam  Constitutional: She is oriented to person, place, and time. She appears well-developed and well-nourished.  HENT:  Head: Normocephalic and atraumatic.  Eyes: EOM are normal. Pupils are equal, round, and reactive to light.  Neck: Neck supple.  Cardiovascular: Normal rate, regular rhythm, normal heart  sounds and intact distal pulses.   Pulmonary/Chest: Effort normal and breath sounds normal.  Abdominal: Soft. Bowel sounds are normal. She exhibits no distension. There is tenderness.  Left lower quadrant tenderness palpation. No guarding or rebound. Positive left CVA tenderness.  Musculoskeletal: Normal range of motion. She exhibits no edema or tenderness.  Lower extremity is or normal. No peripheral edema. Skin condition excellent. Negative straight leg raise.  Neurological: She is alert and oriented to person, place, and time. She has normal strength. She exhibits normal muscle tone. Coordination normal. GCS eye subscore is 4. GCS verbal subscore is 5. GCS motor subscore is 6.  Skin: Skin is warm, dry and intact.  Psychiatric: She has a normal mood and affect.    ED Course  Procedures (including critical care time) Labs Review Labs Reviewed  URINALYSIS, ROUTINE W REFLEX MICROSCOPIC (NOT AT [facility]) - Abnormal; Notable for the following:    APPearance CLOUDY (*)    Hgb urine dipstick LARGE (*)    All other components within normal limits  LIPASE, BLOOD - Abnormal; Notable for the following:    Lipase 53 (*)    All other components within normal limits  COMPREHENSIVE METABOLIC PANEL - Abnormal; Notable for the following:    Glucose, Bld 125 (*)    All other components within normal limits  URINE MICROSCOPIC-ADD ON - Abnormal; Notable for the following:    Squamous Epithelial / LPF 0-5 (*)    Bacteria, UA RARE (*)    All other components within normal limits  CBC    Imaging Review Ct Renal Stone Study  10/13/2015  CLINICAL DATA:  Left flank pain and hematuria for 1 week. EXAM: CT ABDOMEN AND PELVIS WITHOUT CONTRAST TECHNIQUE: Multidetector CT imaging of the abdomen and pelvis was performed following the standard protocol without IV contrast. COMPARISON:  None. FINDINGS: Two tiny nodules demonstrated in the left lung base, each measuring about 2 mm in diameter. Possible calcification in  1 of the nodules. No follow-up needed if patient is low-risk (and has no known or suspected primary neoplasm). Non-contrast chest CT can be considered in 12 months if patient is high-risk. This recommendation follows the consensus statement: Guidelines for Management of Incidental Pulmonary Nodules Detected on CT Images:From the Fleischner Society 2017; published online before print (10.1148/[id number]). There is a 3.5 mm stone in the left ureteropelvic junction with mild proximal hydronephrosis in the left kidney. Distal left ureter is decompressed. No additional renal or ureteral stones are demonstrated. No bladder stones are demonstrated. Bladder wall is not thickened. Mild diffuse fatty infiltration of the liver. The gallbladder, pancreas, spleen, adrenal glands, abdominal aorta, inferior vena cava, and retroperitoneal lymph nodes are unremarkable. Stomach, small bowel, and colon are not abnormally distended. Scattered stool in the colon. No free air or free fluid in the abdomen. Small  umbilical hernia containing fat. Pelvis: Appendix is normal. Uterus is surgically absent. No pelvic mass or lymphadenopathy. No free or loculated pelvic fluid collections. Mild prominence of lymph nodes in the groin regions bilaterally, likely inflammatory or reactive. No destructive bone lesions. Mild degenerative changes of the lumbosacral interspace. IMPRESSION: 3.5 mm cm within the left ureteropelvic junction with mild proximal obstruction. Mild diffuse fatty infiltration of the liver. Two nodules in the left lung base, each measuring about 2 mm diameter. Electronically Signed   By: Burman NievesWilliam  Stevens M.D.   On: 10/13/2015 23:12   I have personally reviewed and evaluated these images and lab results as part of my medical decision-making.   EKG Interpretation None      MDM   Final diagnoses:  Kidney stone   CT confirms kidney stone. This is consistent with patient's left flank pain. No UTI present. Patient is  afebrile. Patient is not having active vomiting. Patient will be treated for pain. She is given prescriptions for Zofran, ibuprofen, Flomax and Vicodin. Urine strainers provided with review of signs and symptoms which return. She is counseled to follow up with urology.    Arby BarretteMarcy Blanche Scovell, MD 10/13/15 2329

## 2015-10-13 NOTE — Discharge Instructions (Signed)
Kidney Stones °Kidney stones (urolithiasis) are deposits that form inside your kidneys. The intense pain is caused by the stone moving through the urinary tract. When the stone moves, the ureter goes into spasm around the stone. The stone is usually passed in the urine.  °CAUSES  °· A disorder that makes certain neck glands produce too much parathyroid hormone (primary hyperparathyroidism). °· A buildup of uric acid crystals, similar to gout in your joints. °· Narrowing (stricture) of the ureter. °· A kidney obstruction present at birth (congenital obstruction). °· Previous surgery on the kidney or ureters. °· Numerous kidney infections. °SYMPTOMS  °· Feeling sick to your stomach (nauseous). °· Throwing up (vomiting). °· Blood in the urine (hematuria). °· Pain that usually spreads (radiates) to the groin. °· Frequency or urgency of urination. °DIAGNOSIS  °· Taking a history and physical exam. °· Blood or urine tests. °· CT scan. °· Occasionally, an examination of the inside of the urinary bladder (cystoscopy) is performed. °TREATMENT  °· Observation. °· Increasing your fluid intake. °· Extracorporeal shock wave lithotripsy--This is a noninvasive procedure that uses shock waves to break up kidney stones. °· Surgery may be needed if you have severe pain or persistent obstruction. There are various surgical procedures. Most of the procedures are performed with the use of small instruments. Only small incisions are needed to accommodate these instruments, so recovery time is minimized. °The size, location, and chemical composition are all important variables that will determine the proper choice of action for you. Talk to your health care provider to better understand your situation so that you will minimize the risk of injury to yourself and your kidney.  °HOME CARE INSTRUCTIONS  °· Drink enough water and fluids to keep your urine clear or pale yellow. This will help you to pass the stone or stone fragments. °· Strain  all urine through the provided strainer. Keep all particulate matter and stones for your health care provider to see. The stone causing the pain may be as small as a grain of salt. It is very important to use the strainer each and every time you pass your urine. The collection of your stone will allow your health care provider to analyze it and verify that a stone has actually passed. The stone analysis will often identify what you can do to reduce the incidence of recurrences. °· Only take over-the-counter or prescription medicines for pain, discomfort, or fever as directed by your health care provider. °· Keep all follow-up visits as told by your health care provider. This is important. °· Get follow-up X-rays if required. The absence of pain does not always mean that the stone has passed. It may have only stopped moving. If the urine remains completely obstructed, it can cause loss of kidney function or even complete destruction of the kidney. It is your responsibility to make sure X-rays and follow-ups are completed. Ultrasounds of the kidney can show blockages and the status of the kidney. Ultrasounds are not associated with any radiation and can be performed easily in a matter of minutes. °· Make changes to your daily diet as told by your health care provider. You may be told to: °¨ Limit the amount of salt that you eat. °¨ Eat 5 or more servings of fruits and vegetables each day. °¨ Limit the amount of meat, poultry, fish, and eggs that you eat. °· Collect a 24-hour urine sample as told by your health care provider. You may need to collect another urine sample every 6-12   months. °SEEK MEDICAL CARE IF: °· You experience pain that is progressive and unresponsive to any pain medicine you have been prescribed. °SEEK IMMEDIATE MEDICAL CARE IF:  °· Pain cannot be controlled with the prescribed medicine. °· You have a fever or shaking chills. °· The severity or intensity of pain increases over 18 hours and is not  relieved by pain medicine. °· You develop a new onset of abdominal pain. °· You feel faint or pass out. °· You are unable to urinate. °  °This information is not intended to replace advice given to you by your health care provider. Make sure you discuss any questions you have with your health care provider. °  °Document Released: 05/18/2005 Document Revised: 02/06/2015 Document Reviewed: 10/19/2012 °Elsevier Interactive Patient Education ©2016 [employer] ° °

## 2015-10-21 ENCOUNTER — Telehealth: Payer: Self-pay | Admitting: Family Medicine

## 2015-10-21 NOTE — Telephone Encounter (Signed)
Patient would like to know her results from her recent Cerebral MRI. Please follow up with pt. Thank you.

## 2015-10-23 NOTE — Telephone Encounter (Signed)
Date of birth verified by pt  Results given  MRA of head does not reveal aneurysm or flow limiting stenosis (narrowing) of blood vessels. Although headaches are new the MRA is good news. We will continue to evaluate and adjust treatment based on response to therapy at your follow up visit Pt verbalized understanding

## 2015-10-25 ENCOUNTER — Emergency Department (HOSPITAL_COMMUNITY)
Admission: EM | Admit: 2015-10-25 | Discharge: 2015-10-26 | Disposition: A | Discharge status: Home / Self Care | Payer: Medicaid Other | Attending: Emergency Medicine | Admitting: Emergency Medicine

## 2015-10-25 ENCOUNTER — Encounter (HOSPITAL_COMMUNITY): Payer: Self-pay | Admitting: Emergency Medicine

## 2015-10-25 DIAGNOSIS — Z7982 Long term (current) use of aspirin: Secondary | ICD-10-CM | POA: Diagnosis not present

## 2015-10-25 DIAGNOSIS — N200 Calculus of kidney: Secondary | ICD-10-CM | POA: Diagnosis not present

## 2015-10-25 DIAGNOSIS — Z7984 Long term (current) use of oral hypoglycemic drugs: Secondary | ICD-10-CM | POA: Insufficient documentation

## 2015-10-25 DIAGNOSIS — Z90710 Acquired absence of both cervix and uterus: Secondary | ICD-10-CM | POA: Diagnosis not present

## 2015-10-25 DIAGNOSIS — R112 Nausea with vomiting, unspecified: Secondary | ICD-10-CM | POA: Insufficient documentation

## 2015-10-25 DIAGNOSIS — R109 Unspecified abdominal pain: Secondary | ICD-10-CM | POA: Diagnosis present

## 2015-10-25 DIAGNOSIS — Z791 Long term (current) use of non-steroidal anti-inflammatories (NSAID): Secondary | ICD-10-CM | POA: Insufficient documentation

## 2015-10-25 DIAGNOSIS — R319 Hematuria, unspecified: Secondary | ICD-10-CM | POA: Diagnosis not present

## 2015-10-25 DIAGNOSIS — E119 Type 2 diabetes mellitus without complications: Secondary | ICD-10-CM | POA: Insufficient documentation

## 2015-10-25 DIAGNOSIS — Z9889 Other specified postprocedural states: Secondary | ICD-10-CM | POA: Diagnosis not present

## 2015-10-25 LAB — COMPREHENSIVE METABOLIC PANEL
ALBUMIN: 4.5 g/dL (ref 3.5–5.0)
ALK PHOS: 50 U/L (ref 38–126)
ALT: 40 U/L (ref 14–54)
ANION GAP: 9 (ref 5–15)
AST: 33 U/L (ref 15–41)
BUN: 9 mg/dL (ref 6–20)
CALCIUM: 10 mg/dL (ref 8.9–10.3)
CO2: 27 mmol/L (ref 22–32)
Chloride: 104 mmol/L (ref 101–111)
Creatinine, Ser: 0.72 mg/dL (ref 0.44–1.00)
GFR calc Af Amer: >60 mL/min (ref >60)
GFR calc non Af Amer: >60 mL/min (ref >60)
GLUCOSE: 95 mg/dL (ref 65–99)
Potassium: 3.9 mmol/L (ref 3.5–5.1)
SODIUM: 140 mmol/L (ref 135–145)
Total Bilirubin: 0.7 mg/dL (ref 0.3–1.2)
Total Protein: 8.1 g/dL (ref 6.5–8.1)

## 2015-10-25 LAB — URINALYSIS, ROUTINE W REFLEX MICROSCOPIC
BILIRUBIN URINE: NEGATIVE
GLUCOSE, UA: NEGATIVE mg/dL
Ketones, ur: 15 mg/dL — AB
Leukocytes, UA: NEGATIVE
Nitrite: NEGATIVE
PH: 5.5 (ref 5.0–8.0)
Protein, ur: 100 mg/dL — AB
SPECIFIC GRAVITY, URINE: 1.026 (ref 1.005–1.030)

## 2015-10-25 LAB — CBC
HEMATOCRIT: 44.3 % (ref 36.0–46.0)
HEMOGLOBIN: 14.4 g/dL (ref 12.0–15.0)
MCH: 29.4 pg (ref 26.0–34.0)
MCHC: 32.5 g/dL (ref 30.0–36.0)
MCV: 90.6 fL (ref 78.0–100.0)
Platelets: 350 10*3/uL (ref 150–400)
RBC: 4.89 MIL/uL (ref 3.87–5.11)
RDW: 12.1 % (ref 11.5–15.5)
WBC: 8.5 10*3/uL (ref 4.0–10.5)

## 2015-10-25 LAB — URINE MICROSCOPIC-ADD ON: WBC, UA: NONE SEEN WBC/hpf (ref 0–5)

## 2015-10-25 LAB — LIPASE, BLOOD: Lipase: 50 U/L (ref 11–51)

## 2015-10-25 MED ORDER — ONDANSETRON 4 MG PO TBDP
4.0000 mg | ORAL_TABLET | Freq: Once | ORAL | Status: AC | PRN
  Administered 2015-10-25: 4 mg via ORAL

## 2015-10-25 MED ORDER — KETOROLAC TROMETHAMINE 30 MG/ML IJ SOLN
30.0000 mg | Freq: Once | INTRAMUSCULAR | Status: AC
  Administered 2015-10-25: 30 mg via INTRAVENOUS
  Filled 2015-10-25: qty 1

## 2015-10-25 MED ORDER — ONDANSETRON 4 MG PO TBDP
ORAL_TABLET | ORAL | Status: AC
  Filled 2015-10-25: qty 1

## 2015-10-25 MED ORDER — HYDROMORPHONE HCL 1 MG/ML IJ SOLN
1.0000 mg | Freq: Once | INTRAMUSCULAR | Status: AC
  Administered 2015-10-25: 1 mg via INTRAVENOUS
  Filled 2015-10-25: qty 1

## 2015-10-25 NOTE — ED Notes (Signed)
Husband came up to tech first desk and stated that his wife was having chest pains now. Triage RN notified.

## 2015-10-25 NOTE — ED Notes (Signed)
Pt here with sidden onset left lower abdominal pain with radiation to back at 1800 today. Pt also reports diarrhea x 2 since then and emesis x 3. Pt actively vomiting in triage. Pt denies fevers.

## 2015-10-25 NOTE — ED Provider Notes (Signed)
CSN: [id number]     Arrival date & time 10/25/15  2044 History   First MD Initiated Contact with Patient 10/25/15 2306     Chief Complaint  Patient presents with  . Emesis  . Abdominal Pain  . Back Pain     (Consider location/radiation/quality/duration/timing/severity/associated sxs/prior Treatment) HPI Comments: Patient presents to the emergency department with chief complaint of left flank and back pain. She states that the pain reoccurred suddenly in her back today. She states that it wraps around her flank into her abdomen. She reports associated hematuria. She denies having passed her kidney stone. She denies any fevers or chills. She does report associated nausea and vomiting. She states that she has tried taking her pain medication at home with no relief. There are no modifying factors.  The history is provided by the patient. No language interpreter was used.    Past Medical History  Diagnosis Date  . Borderline diabetic   . Diabetes mellitus without complication [facility])    Past Surgical History  Procedure Laterality Date  . Abdominal hysterectomy    . Back surgery     Family History  Problem Relation Age of Onset  . Diabetes Mother   . Hypertension Mother   . Heart disease Mother   . Diabetes Father   . Hypertension Father   . Heart disease Father   . Cancer Maternal Aunt     liver   Social History  Substance Use Topics  . Smoking status: Never Smoker   . Smokeless tobacco: Never Used  . Alcohol Use: No   OB History    No data available     Review of Systems  Constitutional: Negative for fever and chills.  Respiratory: Negative for shortness of breath.   Cardiovascular: Negative for chest pain.  Gastrointestinal: Negative for nausea, vomiting, diarrhea and constipation.  Genitourinary: Positive for hematuria and flank pain. Negative for dysuria.  All other systems reviewed and are negative.     Allergies  Review of patient's allergies indicates no  known allergies.  Home Medications   Prior to Admission medications   Medication Sig Start Date End Date Taking? Authorizing Provider  aspirin EC 325 MG tablet Take 1 tablet (325 mg total) by mouth daily. 10/07/15   Josalyn Funches, MD  atorvastatin (LIPITOR) 40 MG tablet Take 1 tablet (40 mg total) by mouth daily. Patient not taking: Reported on 10/13/2015 07/15/15   Dessa Phi, MD  HYDROcodone-acetaminophen (NORCO/VICODIN) 5-325 MG tablet Take 1-2 tablets by mouth every 4 (four) hours as needed for moderate pain or severe pain. 10/13/15   Arby Barrette, MD  ibuprofen (ADVIL,MOTRIN) 200 MG tablet Take 400 mg by mouth every 6 (six) hours as needed for moderate pain.    Historical Provider, MD  ibuprofen (ADVIL,MOTRIN) 600 MG tablet Take 1 tablet (600 mg total) by mouth every 6 (six) hours as needed. 10/13/15   Arby Barrette, MD  ibuprofen (ADVIL,MOTRIN) 800 MG tablet Take 1 tablet (800 mg total) by mouth 3 (three) times daily. 10/02/15   Renne Crigler, PA-C  metFORMIN (GLUCOPHAGE) 500 MG tablet Take 1 tablet (500 mg total) by mouth 2 (two) times daily with a meal. 07/12/15   Dessa Phi, MD  Misc. Devices (CANE) MISC 1 each by Does not apply route as needed. 03/04/15   Josalyn Funches, MD  ondansetron (ZOFRAN ODT) 4 MG disintegrating tablet Take 1 tablet (4 mg total) by mouth every 4 (four) hours as needed for nausea or vomiting. 10/13/15  Arby Barrette, MD  pregabalin (LYRICA) 75 MG capsule Take 1 capsule (75 mg total) by mouth 2 (two) times daily. Patient not taking: Reported on 10/13/2015 10/07/15   Dessa Phi, MD  tamsulosin (FLOMAX) 0.4 MG CAPS capsule Take 1 capsule (0.4 mg total) by mouth daily. 10/13/15   Arby Barrette, MD  traMADol (ULTRAM) 50 MG tablet Take 1 tablet (50 mg total) by mouth every 6 (six) hours as needed. Patient not taking: Reported on 10/13/2015 10/07/15   Josalyn Funches, MD   BP 181/98 mmHg  Pulse 84  Temp(Src) 98.1 F (36.7 C) (Oral)  Resp 17  Wt 78.472 kg   SpO2 100% Physical Exam  Constitutional: She is oriented to person, place, and time. She appears well-developed and well-nourished.  HENT:  Head: Normocephalic and atraumatic.  Eyes: Conjunctivae and EOM are normal. Pupils are equal, round, and reactive to light.  Neck: Normal range of motion. Neck supple.  Cardiovascular: Normal rate and regular rhythm.  Exam reveals no gallop and no friction rub.   No murmur heard. Pulmonary/Chest: Effort normal and breath sounds normal. No respiratory distress. She has no wheezes. She has no rales. She exhibits no tenderness.  Abdominal: Soft. Bowel sounds are normal. She exhibits no distension and no mass. There is no tenderness. There is no rebound and no guarding.  Positive CVA tenderness on the left side, no focal abdominal tenderness  Musculoskeletal: Normal range of motion. She exhibits no edema or tenderness.  Neurological: She is alert and oriented to person, place, and time.  Skin: Skin is warm and dry.  Psychiatric: She has a normal mood and affect. Her behavior is normal. Judgment and thought content normal.  Nursing note and vitals reviewed.   ED Course  Procedures (including critical care time) Results for orders placed or performed during the hospital encounter of 10/25/15  Lipase, blood  Result Value Ref Range   Lipase 50 11 - 51 U/L  Comprehensive metabolic panel  Result Value Ref Range   Sodium 140 135 - 145 mmol/L   Potassium 3.9 3.5 - 5.1 mmol/L   Chloride 104 101 - 111 mmol/L   CO2 27 22 - 32 mmol/L   Glucose, Bld 95 65 - 99 mg/dL   BUN 9 6 - 20 mg/dL   Creatinine, Ser 1.61 0.44 - 1.00 mg/dL   Calcium 09.6 8.9 - 04.5 mg/dL   Total Protein 8.1 6.5 - 8.1 g/dL   Albumin 4.5 3.5 - 5.0 g/dL   AST 33 15 - 41 U/L   ALT 40 14 - 54 U/L   Alkaline Phosphatase 50 38 - 126 U/L   Total Bilirubin 0.7 0.3 - 1.2 mg/dL   GFR calc non Af Amer >60 >60 mL/min   GFR calc Af Amer >60 >60 mL/min   Anion gap 9 5 - 15  CBC  Result Value Ref  Range   WBC 8.5 4.0 - 10.5 K/uL   RBC 4.89 3.87 - 5.11 MIL/uL   Hemoglobin 14.4 12.0 - 15.0 g/dL   HCT 40.9 81.1 - 91.4 %   MCV 90.6 78.0 - 100.0 fL   MCH 29.4 26.0 - 34.0 pg   MCHC 32.5 30.0 - 36.0 g/dL   RDW 78.2 95.6 - 21.3 %   Platelets 350 150 - 400 K/uL  Urinalysis, Routine w reflex microscopic  Result Value Ref Range   Color, Urine YELLOW YELLOW   APPearance CLOUDY (A) CLEAR   Specific Gravity, Urine 1.026 1.005 - 1.030  pH 5.5 5.0 - 8.0   Glucose, UA NEGATIVE NEGATIVE mg/dL   Hgb urine dipstick LARGE (A) NEGATIVE   Bilirubin Urine NEGATIVE NEGATIVE   Ketones, ur 15 (A) NEGATIVE mg/dL   Protein, ur 161100 (A) NEGATIVE mg/dL   Nitrite NEGATIVE NEGATIVE   Leukocytes, UA NEGATIVE NEGATIVE  Urine microscopic-add on  Result Value Ref Range   Squamous Epithelial / LPF 0-5 (A) NONE SEEN   WBC, UA NONE SEEN 0 - 5 WBC/hpf   RBC / HPF TOO NUMEROUS TO COUNT 0 - 5 RBC/hpf   Bacteria, UA RARE (A) NONE SEEN   Crystals CA OXALATE CRYSTALS (A) NEGATIVE   Mr Maxine GlennMra Head/brain Wo Cm  10/09/2015  CLINICAL DATA:  New onset left temple Headache with abnormal sensation in the left side of the face. Symptoms for 1 week. EXAM: MRA HEAD WITHOUT CONTRAST TECHNIQUE: Angiographic images of the Circle of [place] were obtained using MRA technique without intravenous contrast. COMPARISON:  None. FINDINGS: The visualized distal vertebral arteries are widely patent and codominant. Left PICA origin is patent. Right AICA is dominant. SCA origins are patent. Basilar artery is widely patent. There are patent posterior communicating arteries bilaterally. There is suggestion of mild distal right P1 stenosis. The internal carotid arteries are patent from skullbase to carotid termini without stenosis. ACAs and MCAs are patent without evidence of major branch occlusion or significant stenosis. No intracranial aneurysm is identified. IMPRESSION: 1. Mild proximal right PCA stenosis. 2. No intracranial aneurysm or flow limiting  stenosis. Electronically Signed   By: Sebastian AcheAllen  Grady M.D.   On: 10/09/2015 17:56   Ct Renal Stone Study  10/13/2015  CLINICAL DATA:  Left flank pain and hematuria for 1 week. EXAM: CT ABDOMEN AND PELVIS WITHOUT CONTRAST TECHNIQUE: Multidetector CT imaging of the abdomen and pelvis was performed following the standard protocol without IV contrast. COMPARISON:  None. FINDINGS: Two tiny nodules demonstrated in the left lung base, each measuring about 2 mm in diameter. Possible calcification in 1 of the nodules. No follow-up needed if patient is low-risk (and has no known or suspected primary neoplasm). Non-contrast chest CT can be considered in 12 months if patient is high-risk. This recommendation follows the consensus statement: Guidelines for Management of Incidental Pulmonary Nodules Detected on CT Images:From the Fleischner Society 2017; published online before print (10.1148/radiol.[id number]). There is a 3.5 mm stone in the left ureteropelvic junction with mild proximal hydronephrosis in the left kidney. Distal left ureter is decompressed. No additional renal or ureteral stones are demonstrated. No bladder stones are demonstrated. Bladder wall is not thickened. Mild diffuse fatty infiltration of the liver. The gallbladder, pancreas, spleen, adrenal glands, abdominal aorta, inferior vena cava, and retroperitoneal lymph nodes are unremarkable. Stomach, small bowel, and colon are not abnormally distended. Scattered stool in the colon. No free air or free fluid in the abdomen. Small umbilical hernia containing fat. Pelvis: Appendix is normal. Uterus is surgically absent. No pelvic mass or lymphadenopathy. No free or loculated pelvic fluid collections. Mild prominence of lymph nodes in the groin regions bilaterally, likely inflammatory or reactive. No destructive bone lesions. Mild degenerative changes of the lumbosacral interspace. IMPRESSION: 3.5 mm cm within the left ureteropelvic junction with mild proximal  obstruction. Mild diffuse fatty infiltration of the liver. Two nodules in the left lung base, each measuring about 2 mm diameter. Electronically Signed   By: Burman NievesWilliam  Stevens M.D.   On: 10/13/2015 23:12      MDM   Final diagnoses:  Kidney stone  Patient with known kidney stone. She began having symptoms about 3 weeks ago. She was seen on 5/14 and had a CT scan that showed a 3.5 mm left sided stone. She states that she has not passed the stone. Will treat pain. Will reassess.  12:10 AM Patient's pain is significantly improved. Will reassess.  12:31 AM Patient states that pain is a 7/10 compared to a 15/10 when she arrived.  Will give some additional pain meds and reassess.  Patient signed out to [place], [place].  Plan is for reassessment in 45 minutes.  If pain is controlled then DC to home.  Urology follow-up.   Roxy Horseman, PA-C 10/26/15 1610  Derwood Kaplan, MD 10/26/15 0120

## 2015-10-26 MED ORDER — OXYCODONE-ACETAMINOPHEN 5-325 MG PO TABS: 1.0000 | ORAL_TABLET | ORAL | Status: DC | PRN

## 2015-10-26 MED ORDER — NAPROXEN 500 MG PO TABS: 500.0000 mg | ORAL_TABLET | Freq: Two times a day (BID) | ORAL | Status: DC

## 2015-10-26 MED ORDER — HYDROMORPHONE HCL 1 MG/ML IJ SOLN
1.0000 mg | Freq: Once | INTRAMUSCULAR | Status: AC
  Administered 2015-10-26: 1 mg via INTRAVENOUS
  Filled 2015-10-26: qty 1

## 2015-10-26 MED ORDER — OXYCODONE-ACETAMINOPHEN 5-325 MG PO TABS: 2.0000 | ORAL_TABLET | Freq: Four times a day (QID) | ORAL | Status: DC | PRN

## 2015-10-26 MED ORDER — ONDANSETRON 4 MG PO TBDP: 4.0000 mg | ORAL_TABLET | Freq: Three times a day (TID) | ORAL | Status: DC | PRN

## 2015-10-26 NOTE — ED Notes (Signed)
PO challenge given  

## 2015-10-26 NOTE — ED Notes (Signed)
PA at bedside.

## 2015-10-26 NOTE — ED Provider Notes (Signed)
Jill Anderson is a 50 y.o. female, with a history of DM, presenting to the ED with left flank pain recurring earlier today. Patient states she has a known kidney stone on the left. Patient has not passed the stone as far she knows. Also endorses frequent nausea and vomiting. Patient denies fever/chills, abdominal pain, urinary complaints, or any other complaints.  Ivar Drape, PA-C HPI: "Patient presents to the emergency department with chief complaint of left flank and back pain. She states that the pain reoccurred suddenly in her back today. She states that it wraps around her flank into her abdomen. She reports associated hematuria. She denies having passed her kidney stone. She denies any fevers or chills. She does report associated nausea and vomiting. She states that she has tried taking her pain medication at home with no relief. There are no modifying factors."  Past Medical History  Diagnosis Date  . Borderline diabetic   . Diabetes mellitus without complication (HCC)     Physical Exam  BP 123/70 mmHg  Pulse 79  Temp(Src) 98.1 F (36.7 C) (Oral)  Resp 17  Wt 78.472 kg  SpO2 97%  Physical Exam  Constitutional: She appears well-developed and well-nourished. No distress.  HENT:  Head: Normocephalic and atraumatic.  Eyes: Conjunctivae are normal.  Neck: Neck supple.  Cardiovascular: Normal rate, regular rhythm, normal heart sounds and intact distal pulses.   Pulmonary/Chest: Effort normal and breath sounds normal. No respiratory distress.  Abdominal: Soft. There is no tenderness. There is CVA tenderness (left). There is no guarding.  Musculoskeletal: She exhibits no edema.  Lymphadenopathy:    She has no cervical adenopathy.  Neurological: She is alert.  Skin: Skin is warm and dry. She is not diaphoretic.  Psychiatric: She has a normal mood and affect. Her behavior is normal.  Nursing note and vitals reviewed.   ED Course  Procedures   Results for orders placed or  performed during the hospital encounter of 10/25/15  Lipase, blood  Result Value Ref Range   Lipase 50 11 - 51 U/L  Comprehensive metabolic panel  Result Value Ref Range   Sodium 140 135 - 145 mmol/L   Potassium 3.9 3.5 - 5.1 mmol/L   Chloride 104 101 - 111 mmol/L   CO2 27 22 - 32 mmol/L   Glucose, Bld 95 65 - 99 mg/dL   BUN 9 6 - 20 mg/dL   Creatinine, Ser 1.61 0.44 - 1.00 mg/dL   Calcium 09.6 8.9 - 04.5 mg/dL   Total Protein 8.1 6.5 - 8.1 g/dL   Albumin 4.5 3.5 - 5.0 g/dL   AST 33 15 - 41 U/L   ALT 40 14 - 54 U/L   Alkaline Phosphatase 50 38 - 126 U/L   Total Bilirubin 0.7 0.3 - 1.2 mg/dL   GFR calc non Af Amer >60 >60 mL/min   GFR calc Af Amer >60 >60 mL/min   Anion gap 9 5 - 15  CBC  Result Value Ref Range   WBC 8.5 4.0 - 10.5 K/uL   RBC 4.89 3.87 - 5.11 MIL/uL   Hemoglobin 14.4 12.0 - 15.0 g/dL   HCT 40.9 81.1 - 91.4 %   MCV 90.6 78.0 - 100.0 fL   MCH 29.4 26.0 - 34.0 pg   MCHC 32.5 30.0 - 36.0 g/dL   RDW 78.2 95.6 - 21.3 %   Platelets 350 150 - 400 K/uL  Urinalysis, Routine w reflex microscopic  Result Value Ref Range   Color, Urine  YELLOW YELLOW   APPearance CLOUDY (A) CLEAR   Specific Gravity, Urine 1.026 1.005 - 1.030   pH 5.5 5.0 - 8.0   Glucose, UA NEGATIVE NEGATIVE mg/dL   Hgb urine dipstick LARGE (A) NEGATIVE   Bilirubin Urine NEGATIVE NEGATIVE   Ketones, ur 15 (A) NEGATIVE mg/dL   Protein, ur 161100 (A) NEGATIVE mg/dL   Nitrite NEGATIVE NEGATIVE   Leukocytes, UA NEGATIVE NEGATIVE  Urine microscopic-add on  Result Value Ref Range   Squamous Epithelial / LPF 0-5 (A) NONE SEEN   WBC, UA NONE SEEN 0 - 5 WBC/hpf   RBC / HPF TOO NUMEROUS TO COUNT 0 - 5 RBC/hpf   Bacteria, UA RARE (A) NONE SEEN   Crystals CA OXALATE CRYSTALS (A) NEGATIVE   Mr Maxine GlennMra Head/brain Wo Cm  10/09/2015  CLINICAL DATA:  New onset left temple Headache with abnormal sensation in the left side of the face. Symptoms for 1 week. EXAM: MRA HEAD WITHOUT CONTRAST TECHNIQUE: Angiographic images  of the Circle of [place] were obtained using MRA technique without intravenous contrast. COMPARISON:  None. FINDINGS: The visualized distal vertebral arteries are widely patent and codominant. Left PICA origin is patent. Right AICA is dominant. SCA origins are patent. Basilar artery is widely patent. There are patent posterior communicating arteries bilaterally. There is suggestion of mild distal right P1 stenosis. The internal carotid arteries are patent from skullbase to carotid termini without stenosis. ACAs and MCAs are patent without evidence of major branch occlusion or significant stenosis. No intracranial aneurysm is identified. IMPRESSION: 1. Mild proximal right PCA stenosis. 2. No intracranial aneurysm or flow limiting stenosis. Electronically Signed   By: Sebastian AcheAllen  Grady M.D.   On: 10/09/2015 17:56   Ct Renal Stone Study  10/13/2015  CLINICAL DATA:  Left flank pain and hematuria for 1 week. EXAM: CT ABDOMEN AND PELVIS WITHOUT CONTRAST TECHNIQUE: Multidetector CT imaging of the abdomen and pelvis was performed following the standard protocol without IV contrast. COMPARISON:  None. FINDINGS: Two tiny nodules demonstrated in the left lung base, each measuring about 2 mm in diameter. Possible calcification in 1 of the nodules. No follow-up needed if patient is low-risk (and has no known or suspected primary neoplasm). Non-contrast chest CT can be considered in 12 months if patient is high-risk. This recommendation follows the consensus statement: Guidelines for Management of Incidental Pulmonary Nodules Detected on CT Images:From the Fleischner Society 2017; published online before print (10.1148/[id number]). There is a 3.5 mm stone in the left ureteropelvic junction with mild proximal hydronephrosis in the left kidney. Distal left ureter is decompressed. No additional renal or ureteral stones are demonstrated. No bladder stones are demonstrated. Bladder wall is not thickened. Mild diffuse fatty  infiltration of the liver. The gallbladder, pancreas, spleen, adrenal glands, abdominal aorta, inferior vena cava, and retroperitoneal lymph nodes are unremarkable. Stomach, small bowel, and colon are not abnormally distended. Scattered stool in the colon. No free air or free fluid in the abdomen. Small umbilical hernia containing fat. Pelvis: Appendix is normal. Uterus is surgically absent. No pelvic mass or lymphadenopathy. No free or loculated pelvic fluid collections. Mild prominence of lymph nodes in the groin regions bilaterally, likely inflammatory or reactive. No destructive bone lesions. Mild degenerative changes of the lumbosacral interspace. IMPRESSION: 3.5 mm cm within the left ureteropelvic junction with mild proximal obstruction. Mild diffuse fatty infiltration of the liver. Two nodules in the left lung base, each measuring about 2 mm diameter. Electronically Signed   By: Burman NievesWilliam  Stevens  M.D.   On: 10/13/2015 23:12      MDM [facility] With left flank pain that recurred earlier today.  Received patient care handoff report from Roxy Horseman, PA-C. Difficulty with pain control initially. Pain finally under control. If pain is still controlled, discharge.  Upon my initial assessment, patient still has minor CVA tenderness, but states her pain is now controlled, rates it 4 out of 10. Denies nausea. Patient to follow up with urology. Home care and return precautions discussed. Patient voiced understanding of these instructions, accepts the plan, and is comfortable with discharge.  Filed Vitals:   10/26/15 0030 10/26/15 0045 10/26/15 0100 10/26/15 0115  BP: 125/62 146/75 132/73 122/68  Pulse: 78 74 73 73  Temp:      TempSrc:      Resp:      Weight:      SpO2: 98% 97% 94% 94%   Filed Vitals:   10/26/15 0115 10/26/15 0130 10/26/15 0145 10/26/15 0200  BP: 122/68 135/69 119/70 123/70  Pulse: 73 66 65 79  Temp:      TempSrc:      Resp:      Weight:      SpO2: 94% 95% 96% 97%      Anselm Pancoast, PA-C 10/26/15 1610

## 2015-10-26 NOTE — ED Notes (Signed)
Patient left at this time with all belongings. 

## 2015-10-26 NOTE — Discharge Instructions (Signed)
You have been seen today for flank pain. Your lab tests showed no abnormalities. It is likely your pain stems from your known kidney stone. Follow-up with urology as soon as possible. Call the number provided to set up an appointment. Percocet for pain control. Zofran for nausea and vomiting. Do not drive or do other dangerous activities while using the Percocet. Follow up with PCP as needed. Return to ED should symptoms worsen.  Kidney Stones Kidney stones (urolithiasis) are deposits that form inside your kidneys. The intense pain is caused by the stone moving through the urinary tract. When the stone moves, the ureter goes into spasm around the stone. The stone is usually passed in the urine.  CAUSES   A disorder that makes certain neck glands produce too much parathyroid hormone (primary hyperparathyroidism).  A buildup of uric acid crystals, similar to gout in your joints.  Narrowing (stricture) of the ureter.  A kidney obstruction present at birth (congenital obstruction).  Previous surgery on the kidney or ureters.  Numerous kidney infections. SYMPTOMS   Feeling sick to your stomach (nauseous).  Throwing up (vomiting).  Blood in the urine (hematuria).  Pain that usually spreads (radiates) to the groin.  Frequency or urgency of urination. DIAGNOSIS   Taking a history and physical exam.  Blood or urine tests.  CT scan.  Occasionally, an examination of the inside of the urinary bladder (cystoscopy) is performed. TREATMENT   Observation.  Increasing your fluid intake.  Extracorporeal shock wave lithotripsy--This is a noninvasive procedure that uses shock waves to break up kidney stones.  Surgery may be needed if you have severe pain or persistent obstruction. There are various surgical procedures. Most of the procedures are performed with the use of small instruments. Only small incisions are needed to accommodate these instruments, so recovery time is minimized. The  size, location, and chemical composition are all important variables that will determine the proper choice of action for you. Talk to your health care provider to better understand your situation so that you will minimize the risk of injury to yourself and your kidney.  HOME CARE INSTRUCTIONS   Drink enough water and fluids to keep your urine clear or pale yellow. This will help you to pass the stone or stone fragments.  Strain all urine through the provided strainer. Keep all particulate matter and stones for your health care provider to see. The stone causing the pain may be as small as a grain of salt. It is very important to use the strainer each and every time you pass your urine. The collection of your stone will allow your health care provider to analyze it and verify that a stone has actually passed. The stone analysis will often identify what you can do to reduce the incidence of recurrences.  Only take over-the-counter or prescription medicines for pain, discomfort, or fever as directed by your health care provider.  Keep all follow-up visits as told by your health care provider. This is important.  Get follow-up X-rays if required. The absence of pain does not always mean that the stone has passed. It may have only stopped moving. If the urine remains completely obstructed, it can cause loss of kidney function or even complete destruction of the kidney. It is your responsibility to make sure X-rays and follow-ups are completed. Ultrasounds of the kidney can show blockages and the status of the kidney. Ultrasounds are not associated with any radiation and can be performed easily in a matter of minutes.  Make changes to your daily diet as told by your health care provider. You may be told to:  Limit the amount of salt that you eat.  Eat 5 or more servings of fruits and vegetables each day.  Limit the amount of meat, poultry, fish, and eggs that you eat.  Collect a 24-hour urine sample  as told by your health care provider.You may need to collect another urine sample every 6-12 months. SEEK MEDICAL CARE IF:  You experience pain that is progressive and unresponsive to any pain medicine you have been prescribed. SEEK IMMEDIATE MEDICAL CARE IF:   Pain cannot be controlled with the prescribed medicine.  You have a fever or shaking chills.  The severity or intensity of pain increases over 18 hours and is not relieved by pain medicine.  You develop a new onset of abdominal pain.  You feel faint or pass out.  You are unable to urinate.   This information is not intended to replace advice given to you by your health care provider. Make sure you discuss any questions you have with your health care provider.   Document Released: 05/18/2005 Document Revised: 02/06/2015 Document Reviewed: 10/19/2012 Elsevier Interactive Patient Education [employer].

## 2015-10-27 ENCOUNTER — Emergency Department (HOSPITAL_COMMUNITY): Payer: Medicaid Other

## 2015-10-27 ENCOUNTER — Encounter (HOSPITAL_COMMUNITY): Payer: Self-pay | Admitting: Emergency Medicine

## 2015-10-27 ENCOUNTER — Observation Stay (HOSPITAL_COMMUNITY)
Admission: EM | Admit: 2015-10-27 | Discharge: 2015-10-29 | Disposition: A | Discharge status: Home / Self Care | Payer: Medicaid Other | Attending: Urology | Admitting: Urology

## 2015-10-27 DIAGNOSIS — E119 Type 2 diabetes mellitus without complications: Secondary | ICD-10-CM | POA: Insufficient documentation

## 2015-10-27 DIAGNOSIS — M542 Cervicalgia: Secondary | ICD-10-CM | POA: Diagnosis not present

## 2015-10-27 DIAGNOSIS — Z7984 Long term (current) use of oral hypoglycemic drugs: Secondary | ICD-10-CM | POA: Diagnosis not present

## 2015-10-27 DIAGNOSIS — N132 Hydronephrosis with renal and ureteral calculous obstruction: Principal | ICD-10-CM | POA: Insufficient documentation

## 2015-10-27 DIAGNOSIS — Z7982 Long term (current) use of aspirin: Secondary | ICD-10-CM | POA: Insufficient documentation

## 2015-10-27 DIAGNOSIS — Z6832 Body mass index (BMI) 32.0-32.9, adult: Secondary | ICD-10-CM | POA: Insufficient documentation

## 2015-10-27 DIAGNOSIS — M549 Dorsalgia, unspecified: Secondary | ICD-10-CM | POA: Diagnosis not present

## 2015-10-27 DIAGNOSIS — G8929 Other chronic pain: Secondary | ICD-10-CM | POA: Diagnosis not present

## 2015-10-27 DIAGNOSIS — K760 Fatty (change of) liver, not elsewhere classified: Secondary | ICD-10-CM | POA: Insufficient documentation

## 2015-10-27 DIAGNOSIS — N201 Calculus of ureter: Secondary | ICD-10-CM

## 2015-10-27 DIAGNOSIS — N23 Unspecified renal colic: Secondary | ICD-10-CM | POA: Diagnosis present

## 2015-10-27 LAB — CBC WITH DIFFERENTIAL/PLATELET
BASOS PCT: 0 %
Basophils Absolute: 0 10*3/uL (ref 0.0–0.1)
EOS ABS: 0.3 10*3/uL (ref 0.0–0.7)
EOS PCT: 3 %
HCT: 39.8 % (ref 36.0–46.0)
Hemoglobin: 13.3 g/dL (ref 12.0–15.0)
Lymphocytes Relative: 24 %
Lymphs Abs: 2.4 10*3/uL (ref 0.7–4.0)
MCH: 30.4 pg (ref 26.0–34.0)
MCHC: 33.4 g/dL (ref 30.0–36.0)
MCV: 91.1 fL (ref 78.0–100.0)
MONO ABS: 0.9 10*3/uL (ref 0.1–1.0)
MONOS PCT: 9 %
Neutro Abs: 6.3 10*3/uL (ref 1.7–7.7)
Neutrophils Relative %: 64 %
PLATELETS: 284 10*3/uL (ref 150–400)
RBC: 4.37 MIL/uL (ref 3.87–5.11)
RDW: 12 % (ref 11.5–15.5)
WBC: 10 10*3/uL (ref 4.0–10.5)

## 2015-10-27 LAB — URINE MICROSCOPIC-ADD ON: WBC, UA: NONE SEEN WBC/hpf (ref 0–5)

## 2015-10-27 LAB — COMPREHENSIVE METABOLIC PANEL
ALBUMIN: 4.3 g/dL (ref 3.5–5.0)
ALK PHOS: 40 U/L (ref 38–126)
ALT: 28 U/L (ref 14–54)
AST: 25 U/L (ref 15–41)
Anion gap: 8 (ref 5–15)
BUN: 14 mg/dL (ref 6–20)
CALCIUM: 9.2 mg/dL (ref 8.9–10.3)
CO2: 26 mmol/L (ref 22–32)
CREATININE: 1.22 mg/dL — AB (ref 0.44–1.00)
Chloride: 99 mmol/L — ABNORMAL LOW (ref 101–111)
GFR calc non Af Amer: 50 mL/min — ABNORMAL LOW (ref >60)
GFR, EST AFRICAN AMERICAN: 58 mL/min — AB (ref >60)
GLUCOSE: 95 mg/dL (ref 65–99)
Potassium: 3.8 mmol/L (ref 3.5–5.1)
SODIUM: 133 mmol/L — AB (ref 135–145)
Total Bilirubin: 1.6 mg/dL — ABNORMAL HIGH (ref 0.3–1.2)
Total Protein: 7.6 g/dL (ref 6.5–8.1)

## 2015-10-27 LAB — URINALYSIS, ROUTINE W REFLEX MICROSCOPIC
Bilirubin Urine: NEGATIVE
GLUCOSE, UA: NEGATIVE mg/dL
KETONES UR: NEGATIVE mg/dL
LEUKOCYTES UA: NEGATIVE
Nitrite: NEGATIVE
PROTEIN: NEGATIVE mg/dL
Specific Gravity, Urine: 1.015 (ref 1.005–1.030)
pH: 5.5 (ref 5.0–8.0)

## 2015-10-27 LAB — I-STAT TROPONIN, ED: TROPONIN I, POC: 0 ng/mL (ref 0.00–0.08)

## 2015-10-27 LAB — GLUCOSE, CAPILLARY: Glucose-Capillary: 86 mg/dL (ref 65–99)

## 2015-10-27 MED ORDER — POTASSIUM CHLORIDE IN NACL 20-0.45 MEQ/L-% IV SOLN
INTRAVENOUS | Status: DC
  Administered 2015-10-27 – 2015-10-28 (×2): via INTRAVENOUS
  Filled 2015-10-27 (×5): qty 1000

## 2015-10-27 MED ORDER — HYDROMORPHONE HCL 1 MG/ML IJ SOLN
1.0000 mg | Freq: Once | INTRAMUSCULAR | Status: AC
  Administered 2015-10-27: 1 mg via INTRAVENOUS
  Filled 2015-10-27: qty 1

## 2015-10-27 MED ORDER — DIPHENHYDRAMINE HCL 50 MG/ML IJ SOLN: 12.5000 mg | Freq: Four times a day (QID) | INTRAMUSCULAR | Status: DC | PRN

## 2015-10-27 MED ORDER — SODIUM CHLORIDE 0.9 % IV BOLUS (SEPSIS)
1000.0000 mL | Freq: Once | INTRAVENOUS | Status: AC
Start: 2015-10-27 — End: 2015-10-27
  Administered 2015-10-27: 1000 mL via INTRAVENOUS

## 2015-10-27 MED ORDER — DIPHENHYDRAMINE HCL 12.5 MG/5ML PO ELIX: 12.5000 mg | ORAL_SOLUTION | Freq: Four times a day (QID) | ORAL | Status: DC | PRN

## 2015-10-27 MED ORDER — ONDANSETRON HCL 4 MG/2ML IJ SOLN
4.0000 mg | INTRAMUSCULAR | Status: DC | PRN
  Administered 2015-10-29: 4 mg via INTRAVENOUS
  Filled 2015-10-27: qty 2

## 2015-10-27 MED ORDER — ONDANSETRON HCL 4 MG/2ML IJ SOLN
4.0000 mg | Freq: Once | INTRAMUSCULAR | Status: AC
  Administered 2015-10-27: 4 mg via INTRAVENOUS
  Filled 2015-10-27: qty 2

## 2015-10-27 MED ORDER — HYDROMORPHONE HCL 1 MG/ML IJ SOLN
0.5000 mg | INTRAMUSCULAR | Status: DC | PRN
  Administered 2015-10-28 – 2015-10-29 (×2): 1 mg via INTRAVENOUS
  Filled 2015-10-27 (×3): qty 1

## 2015-10-27 MED ORDER — KETOROLAC TROMETHAMINE 15 MG/ML IJ SOLN
15.0000 mg | Freq: Four times a day (QID) | INTRAMUSCULAR | Status: DC | PRN
Start: 2015-10-27 — End: 2015-10-29
  Administered 2015-10-29: 15 mg via INTRAVENOUS
  Filled 2015-10-27: qty 1

## 2015-10-27 MED ORDER — PREGABALIN 75 MG PO CAPS
75.0000 mg | ORAL_CAPSULE | Freq: Two times a day (BID) | ORAL | Status: DC
Start: 2015-10-27 — End: 2015-10-29
  Filled 2015-10-27 (×2): qty 1

## 2015-10-27 MED ORDER — KETOROLAC TROMETHAMINE 15 MG/ML IJ SOLN
15.0000 mg | Freq: Once | INTRAMUSCULAR | Status: AC
  Administered 2015-10-27: 15 mg via INTRAVENOUS
  Filled 2015-10-27: qty 1

## 2015-10-27 MED ORDER — HYDROMORPHONE HCL 1 MG/ML IJ SOLN: 1.0000 mg | INTRAMUSCULAR | Status: DC | PRN

## 2015-10-27 MED ORDER — OXYCODONE-ACETAMINOPHEN 5-325 MG PO TABS
1.0000 | ORAL_TABLET | ORAL | Status: DC | PRN
  Administered 2015-10-28 – 2015-10-29 (×2): 2 via ORAL
  Filled 2015-10-27 (×2): qty 2

## 2015-10-27 MED ORDER — ACETAMINOPHEN 325 MG PO TABS: 650.0000 mg | ORAL_TABLET | ORAL | Status: DC | PRN

## 2015-10-27 MED ORDER — BISACODYL 10 MG RE SUPP: 10.0000 mg | Freq: Every day | RECTAL | Status: DC | PRN

## 2015-10-27 MED ORDER — ZOLPIDEM TARTRATE 5 MG PO TABS: 5.0000 mg | ORAL_TABLET | Freq: Every evening | ORAL | Status: DC | PRN

## 2015-10-27 MED ORDER — TAMSULOSIN HCL 0.4 MG PO CAPS
0.4000 mg | ORAL_CAPSULE | Freq: Every day | ORAL | Status: DC
  Administered 2015-10-27: 0.4 mg via ORAL
  Filled 2015-10-27: qty 1

## 2015-10-27 MED ORDER — ONDANSETRON HCL 4 MG/2ML IJ SOLN
4.0000 mg | Freq: Three times a day (TID) | INTRAMUSCULAR | Status: DC | PRN
  Administered 2015-10-27: 4 mg via INTRAVENOUS
  Filled 2015-10-27: qty 2

## 2015-10-27 MED ORDER — INSULIN ASPART 100 UNIT/ML ~~LOC~~ SOLN
0.0000 [IU] | Freq: Three times a day (TID) | SUBCUTANEOUS | Status: DC
  Administered 2015-10-29: 3 [IU] via SUBCUTANEOUS

## 2015-10-27 NOTE — ED Provider Notes (Signed)
CSN: [id number]     Arrival date & time 10/27/15  1049 History   First MD Initiated Contact with Patient 10/27/15 1122     Chief Complaint  Patient presents with  . Flank Pain     Patient is a 50 y.o. female presenting with flank pain. The history is provided by the patient. No language interpreter was used.  Flank Pain   Autry Droege is a 50 y.o. female who presents to the Emergency Department complaining of flank pain.  She reports 1.5 weeks of intermittent left flank pain.  Pain returned two days ago with severe waxing and waning left flank pain that radiates to her LLQ.  She has associated vomiting, numerous episodes as well as chills.  She denies fevers, dysuria.  She also endorses cough with bilateral upper back pain and mild SOB for the last two days.  She was seen in the ED two days ago and given Rx for percocet, zofran and is taking these medications with only transient improvement in her sxs.    Past Medical History  Diagnosis Date  . Borderline diabetic   . Diabetes mellitus without complication [facility])    Past Surgical History  Procedure Laterality Date  . Abdominal hysterectomy    . Back surgery     Family History  Problem Relation Age of Onset  . Diabetes Mother   . Hypertension Mother   . Heart disease Mother   . Diabetes Father   . Hypertension Father   . Heart disease Father   . Cancer Maternal Aunt     liver   Social History  Substance Use Topics  . Smoking status: Never Smoker   . Smokeless tobacco: Never Used  . Alcohol Use: No   OB History    No data available     Review of Systems  Genitourinary: Positive for flank pain.  All other systems reviewed and are negative.     Allergies  Review of patient's allergies indicates no known allergies.  Home Medications   Prior to Admission medications   Medication Sig Start Date End Date Taking? Authorizing Provider  aspirin EC 325 MG tablet Take 1 tablet (325 mg total) by mouth daily. 10/07/15  Yes  Josalyn Funches, MD  ibuprofen (ADVIL,MOTRIN) 600 MG tablet Take 1 tablet (600 mg total) by mouth every 6 (six) hours as needed. Patient taking differently: Take 600 mg by mouth every 6 (six) hours as needed for mild pain or moderate pain.  10/13/15  Yes Arby Barrette, MD  metFORMIN (GLUCOPHAGE) 500 MG tablet Take 1 tablet (500 mg total) by mouth 2 (two) times daily with a meal. 07/12/15  Yes Josalyn Funches, MD  ondansetron (ZOFRAN ODT) 4 MG disintegrating tablet Take 1 tablet (4 mg total) by mouth every 8 (eight) hours as needed for nausea or vomiting. 10/26/15  Yes Shawn C Joy, PA-C  oxyCODONE-acetaminophen (PERCOCET/ROXICET) 5-325 MG tablet Take 1-2 tablets by mouth every 4 (four) hours as needed for severe pain. 10/26/15  Yes Shawn C Joy, PA-C  pregabalin (LYRICA) 75 MG capsule Take 1 capsule (75 mg total) by mouth 2 (two) times daily. 10/07/15  Yes Josalyn Funches, MD  traMADol (ULTRAM) 50 MG tablet Take 1 tablet (50 mg total) by mouth every 6 (six) hours as needed. Patient taking differently: Take 50 mg by mouth every 6 (six) hours as needed for moderate pain.  10/07/15  Yes Josalyn Funches, MD  atorvastatin (LIPITOR) 40 MG tablet Take 1 tablet (40 mg total) by  mouth daily. Patient not taking: Reported on 10/13/2015 07/15/15   Dessa Phi, MD  HYDROcodone-acetaminophen (NORCO/VICODIN) 5-325 MG tablet Take 1-2 tablets by mouth every 4 (four) hours as needed for moderate pain or severe pain. Patient not taking: Reported on 10/27/2015 10/13/15   Arby Barrette, MD  ibuprofen (ADVIL,MOTRIN) 800 MG tablet Take 1 tablet (800 mg total) by mouth 3 (three) times daily. Patient not taking: Reported on 10/27/2015 10/02/15   Renne Crigler, PA-C  Misc. Devices (CANE) MISC 1 each by Does not apply route as needed. 03/04/15   Josalyn Funches, MD  naproxen (NAPROSYN) 500 MG tablet Take 1 tablet (500 mg total) by mouth 2 (two) times daily. Patient not taking: Reported on 10/27/2015 10/26/15   Shawn C Joy, PA-C   ondansetron (ZOFRAN ODT) 4 MG disintegrating tablet Take 1 tablet (4 mg total) by mouth every 4 (four) hours as needed for nausea or vomiting. Patient not taking: Reported on 10/27/2015 10/13/15   Arby Barrette, MD  tamsulosin (FLOMAX) 0.4 MG CAPS capsule Take 1 capsule (0.4 mg total) by mouth daily. Patient not taking: Reported on 10/27/2015 10/13/15   Arby Barrette, MD   BP 131/68 mmHg  Pulse 75  Temp(Src) 99.3 F (37.4 C) (Oral)  Resp 18  Ht 5\' 2"  (1.575 m)  Wt 175 lb 6.4 oz (79.561 kg)  BMI 32.07 kg/m2  SpO2 96% Physical Exam  Constitutional: She is oriented to person, place, and time. She appears well-developed and well-nourished.  HENT:  Head: Normocephalic and atraumatic.  Cardiovascular: Normal rate and regular rhythm.   No murmur heard. Pulmonary/Chest: Effort normal and breath sounds normal. No respiratory distress.  Abdominal: Soft. There is no tenderness. There is no rebound and no guarding.  L CVA tenderness  Musculoskeletal: She exhibits no edema or tenderness.  Neurological: She is alert and oriented to person, place, and time.  Skin: Skin is warm and dry.  Psychiatric: She has a normal mood and affect. Her behavior is normal.  Nursing note and vitals reviewed.   ED Course  Procedures (including critical care time) Labs Review Labs Reviewed  URINALYSIS, ROUTINE W REFLEX MICROSCOPIC (NOT AT [facility]) - Abnormal; Notable for the following:    Hgb urine dipstick SMALL (*)    All other components within normal limits  COMPREHENSIVE METABOLIC PANEL - Abnormal; Notable for the following:    Sodium 133 (*)    Chloride 99 (*)    Creatinine, Ser 1.22 (*)    Total Bilirubin 1.6 (*)    GFR calc non Af Amer 50 (*)    GFR calc Af Amer 58 (*)    All other components within normal limits  URINE MICROSCOPIC-ADD ON - Abnormal; Notable for the following:    Squamous Epithelial / LPF 0-5 (*)    Bacteria, UA RARE (*)    All other components within normal limits  CBC WITH  DIFFERENTIAL/PLATELET  I-STAT TROPOININ, ED    Imaging Review Dg Chest 2 View  10/27/2015  CLINICAL DATA:  Per pt, states she was diagnosed with small kidney stone last week-states increased pain-has not followed up with urology. No chest complaints. EXAM: CHEST  2 VIEW COMPARISON:  04/06/2015 FINDINGS: Midline trachea.  Normal heart size and mediastinal contours. Sharp costophrenic angles.  No pneumothorax.  Clear lungs. Mild right hemidiaphragm elevation. IMPRESSION: No active cardiopulmonary disease. Electronically Signed   By: Jeronimo Greaves M.D.   On: 10/27/2015 12:28   Dg Abd 1 View  10/27/2015  CLINICAL DATA:  Patient  with recent diagnosis of nephrolithiasis. Worsening pain. EXAM: ABDOMEN - 1 VIEW COMPARISON:  Renal stone CT 10/13/2015. FINDINGS: Multiple rounded calcifications are demonstrated within the pelvis, compatible with phleboliths. Nonobstructed bowel gas pattern. Stool within the cecum. Lumbar spine degenerative changes. No definite calcification identified within the left hemi abdomen. IMPRESSION: No definite calcification identified in the left hemi abdomen correspond with small left renal stone identified on prior CT. Electronically Signed   By: Annia Beltrew  Davis M.D.   On: 10/27/2015 12:39   [place] Renal  10/27/2015  CLINICAL DATA:  Patient with left flank pain for 2 weeks. EXAM: RENAL / URINARY TRACT ULTRASOUND COMPLETE COMPARISON:  Abdominal radiograph earlier same day; CT abdomen pelvis 10/13/2015 FINDINGS: Right Kidney: Length: 13.3 cm. Echogenicity within normal limits. No mass or hydronephrosis visualized. Left Kidney: Length: 13.4 cm. There is mild left hydronephrosis. Normal renal cortical thickness and echogenicity. No definite left ureteral jet is visualized. Bladder: Appears normal for degree of bladder distention. Liver is increased in echogenicity. IMPRESSION: Mild left hydronephrosis. No left ureteral jet visualized within the urinary bladder. Hepatic steatosis. Electronically  Signed   By: Annia Beltrew  Davis M.D.   On: 10/27/2015 14:06   I have personally reviewed and evaluated these images and lab results as part of my medical decision-making.   EKG Interpretation   Date/Time:  Sunday Oct 27 2015 12:31:05 EDT Ventricular Rate:  64 PR Interval:  155 QRS Duration: 85 QT Interval:  380 QTC Calculation: 392 R Axis:   45 Text Interpretation:  Sinus rhythm Confirmed by Lincoln Brighamees, Liz [id number]) on  10/27/2015 12:49:53 PM      MDM   Final diagnoses:  Renal colic on left side   Reviewed prior ED visits.  Pt had CT renal stone study on 5/14 that demonstrated 3.5 mm Left UPJ stone with hydro.   Pt with improved but persistent pain in the ED following treatment.  UA not c/w UTI.  D/w Dr. Wilson SingerWren with Urology he will see the patient in consult for possible stenting.  Pt updated of findings of studies, she is in agreement with plan.     Tilden FossaElizabeth Shareka Casale, MD 10/27/15 1630

## 2015-10-27 NOTE — ED Notes (Signed)
Per pt, states she was diagnosed with small kidney stone last week-states increased pain-has not followed up with urology

## 2015-10-27 NOTE — H&P (Addendum)
Subjective: CC: Left flank pain.  Hx: Jill Anderson is a 50 yo female who had the onset 3 weeks ago of severe left flank pain with N/V.   She was seen in the ER and a CT showed a 5mm left proximal stone with obstruction.   She got relief with meds in the ER and did well until Friday when she had recurrent pain and nausea.  She came to the ER again and was treated for pain and discharged.   She is back today with difficult to control pain with nausea and vomiting.  She has chills without fever but no voiding complaints or hematuria.  She has no prior GU history.   A KUB today doesn't clearly show the stone but she has multiple pelvic phleboliths.   A renal US showed left hydro with no jet.  Her pain is 4/10 after treatment in the ER.   ROS:  Review of Systems  Constitutional: Positive for chills. Negative for fever.  Gastrointestinal: Positive for nausea, vomiting and abdominal pain.  Genitourinary: Positive for flank pain.  All other systems reviewed and are negative.   No Known Allergies  Past Medical History  Diagnosis Date  . Borderline diabetic   . Diabetes mellitus without complication [facility])     Past Surgical History  Procedure Laterality Date  . Abdominal hysterectomy    . Back surgery      Social History   Social History  . Marital Status: Single    Spouse Name: N/A  . Number of Children: N/A  . Years of Education: N/A   Occupational History  . Housewife    Social History Main Topics  . Smoking status: Never Smoker   . Smokeless tobacco: Never Used  . Alcohol Use: No  . Drug Use: No  . Sexual Activity: Not on file   Other Topics Concern  . Not on file   Social History Narrative    Family History  Problem Relation Age of Onset  . Diabetes Mother   . Hypertension Mother   . Heart disease Mother   . Diabetes Father   . Hypertension Father   . Heart disease Father   . Cancer Maternal Aunt     liver    Anti-infectives: Anti-infectives    None       Current Facility-Administered Medications  Medication Dose Route Frequency Provider Last Rate Last Dose  . HYDROmorphone (DILAUDID) injection 1 mg  1 mg Intravenous Q4H PRN Tilden Fossa, MD      . ondansetron [facility]) injection 4 mg  4 mg Intravenous Q8H PRN Tilden Fossa, MD   4 mg at 10/27/15 1524   I have reviewed her home meds.  She takes Metformin for diabetes and tramadol and lyrica for chronic back and neck pain.   She had been given tamsulosin, oxycodone and zofran for her stone.    Objective: Vital signs in last 24 hours: Temp:  [98.5 F (36.9 C)-99.3 F (37.4 C)] 99.3 F (37.4 C) (05/28 1611) Pulse Rate:  [62-78] 75 (05/28 1611) Resp:  [14-23] 18 (05/28 1611) BP: (118-133)/(62-79) 131/68 mmHg (05/28 1611) SpO2:  [91 %-100 %] 96 % (05/28 1611) Weight:  [78.472 kg (173 lb)-79.561 kg (175 lb 6.4 oz)] 79.561 kg (175 lb 6.4 oz) (05/28 1611)  Intake/Output from previous day:   Intake/Output this shift:     Physical Exam  Constitutional: She is oriented to person, place, and time and well-developed, well-nourished, and in no distress.  HENT:  Head: Normocephalic and atraumatic.  Neck: Normal range of motion. Neck supple. No thyromegaly present.  Cardiovascular: Normal rate, regular rhythm and normal heart sounds.   Pulmonary/Chest: Effort normal and breath sounds normal. No respiratory distress.  Abdominal: Soft. Bowel sounds are normal. She exhibits no distension and no mass. There is tenderness (LUQ and LLQ moderate). There is guarding (mild voluntary). There is no rebound.  Musculoskeletal: Normal range of motion. She exhibits no edema or tenderness.  Neurological: She is alert and oriented to person, place, and time.  Skin: Skin is warm and dry.  Psychiatric: Mood and affect normal.  Vitals reviewed.   Lab Results:   Recent Labs  10/25/15 2108 10/27/15 1220  WBC 8.5 10.0  HGB 14.4 13.3  HCT 44.3 39.8  PLT 350 284   BMET  Recent Labs   10/25/15 2108 10/27/15 1220  NA 140 133*  K 3.9 3.8  CL 104 99*  CO2 27 26  GLUCOSE 95 95  BUN 9 14  CREATININE 0.72 1.22*  CALCIUM 10.0 9.2   PT/INR No results for input(s): LABPROT, INR in the last 72 hours. ABG No results for input(s): PHART, HCO3 in the last 72 hours.  Invalid input(s): PCO2, PO2  Studies/Results: Dg Chest 2 View  10/27/2015  CLINICAL DATA:  Per pt, states she was diagnosed with small kidney stone last week-states increased pain-has not followed up with urology. No chest complaints. EXAM: CHEST  2 VIEW COMPARISON:  04/06/2015 FINDINGS: Midline trachea.  Normal heart size and mediastinal contours. Sharp costophrenic angles.  No pneumothorax.  Clear lungs. Mild right hemidiaphragm elevation. IMPRESSION: No active cardiopulmonary disease. Electronically Signed   By: Jeronimo GreavesKyle  Talbot M.D.   On: 10/27/2015 12:28   Dg Abd 1 View  10/27/2015  CLINICAL DATA:  Patient with recent diagnosis of nephrolithiasis. Worsening pain. EXAM: ABDOMEN - 1 VIEW COMPARISON:  Renal stone CT 10/13/2015. FINDINGS: Multiple rounded calcifications are demonstrated within the pelvis, compatible with phleboliths. Nonobstructed bowel gas pattern. Stool within the cecum. Lumbar spine degenerative changes. No definite calcification identified within the left hemi abdomen. IMPRESSION: No definite calcification identified in the left hemi abdomen correspond with small left renal stone identified on prior CT. Electronically Signed   By: Annia Beltrew  Davis M.D.   On: 10/27/2015 12:39   [place] Renal  10/27/2015  CLINICAL DATA:  Patient with left flank pain for 2 weeks. EXAM: RENAL / URINARY TRACT ULTRASOUND COMPLETE COMPARISON:  Abdominal radiograph earlier same day; CT abdomen pelvis 10/13/2015 FINDINGS: Right Kidney: Length: 13.3 cm. Echogenicity within normal limits. No mass or hydronephrosis visualized. Left Kidney: Length: 13.4 cm. There is mild left hydronephrosis. Normal renal cortical thickness and echogenicity.  No definite left ureteral jet is visualized. Bladder: Appears normal for degree of bladder distention. Liver is increased in echogenicity. IMPRESSION: Mild left hydronephrosis. No left ureteral jet visualized within the urinary bladder. Hepatic steatosis. Electronically Signed   By: Annia Beltrew  Davis M.D.   On: 10/27/2015 14:06   I discussed her case with Dr. Tilden FossaElizabeth Rees and I have reviewed her labs as well as the CT and KUB films and report and the Renal US report.     She has multiple pelvic phleboliths that could obscure a distal stone.  She has mild ARI.   Assessment: Left ureteral stone with recurrent pain and nausea.    She has some LLQ tenderness so the stone may have moved but it is not visible on KUB.   I am going to keep  her NPO post MN and reassess in the morning.  If she continues to have pain, I will get her set up for ureteroscopy.   I have reviewed the risks of that procedure including bleeding, infection, ureteral injury, need for a stent or secondary procedures, thrombotic events and anesthetic complications.          Destan Franchini J 10/27/2015 [phone]

## 2015-10-28 ENCOUNTER — Observation Stay (HOSPITAL_COMMUNITY): Payer: Medicaid Other | Admitting: Anesthesiology

## 2015-10-28 ENCOUNTER — Encounter (HOSPITAL_COMMUNITY)
Admission: EM | Disposition: A | Discharge status: Home / Self Care | Payer: Self-pay | Source: Home / Self Care | Attending: Emergency Medicine

## 2015-10-28 ENCOUNTER — Observation Stay (HOSPITAL_COMMUNITY): Payer: Medicaid Other

## 2015-10-28 DIAGNOSIS — G8929 Other chronic pain: Secondary | ICD-10-CM | POA: Diagnosis not present

## 2015-10-28 DIAGNOSIS — N132 Hydronephrosis with renal and ureteral calculous obstruction: Secondary | ICD-10-CM | POA: Diagnosis not present

## 2015-10-28 DIAGNOSIS — K760 Fatty (change of) liver, not elsewhere classified: Secondary | ICD-10-CM | POA: Diagnosis not present

## 2015-10-28 DIAGNOSIS — E119 Type 2 diabetes mellitus without complications: Secondary | ICD-10-CM | POA: Diagnosis not present

## 2015-10-28 HISTORY — PX: CYSTOSCOPY WITH RETROGRADE PYELOGRAM, URETEROSCOPY AND STENT PLACEMENT: SHX5789

## 2015-10-28 LAB — BASIC METABOLIC PANEL
Anion gap: 7 (ref 5–15)
BUN: 12 mg/dL (ref 6–20)
CALCIUM: 8.8 mg/dL — AB (ref 8.9–10.3)
CO2: 26 mmol/L (ref 22–32)
CREATININE: 0.85 mg/dL (ref 0.44–1.00)
Chloride: 107 mmol/L (ref 101–111)
GFR calc Af Amer: >60 mL/min (ref >60)
Glucose, Bld: 101 mg/dL — ABNORMAL HIGH (ref 65–99)
Potassium: 3.9 mmol/L (ref 3.5–5.1)
SODIUM: 140 mmol/L (ref 135–145)

## 2015-10-28 LAB — SURGICAL PCR SCREEN
MRSA, PCR: NEGATIVE
Staphylococcus aureus: NEGATIVE

## 2015-10-28 LAB — GLUCOSE, CAPILLARY
GLUCOSE-CAPILLARY: 87 mg/dL (ref 65–99)
GLUCOSE-CAPILLARY: 102 mg/dL — AB (ref 65–99)
Glucose-Capillary: 105 mg/dL — ABNORMAL HIGH (ref 65–99)
Glucose-Capillary: 238 mg/dL — ABNORMAL HIGH (ref 65–99)

## 2015-10-28 SURGERY — CYSTOURETEROSCOPY, WITH RETROGRADE PYELOGRAM AND STENT INSERTION
Anesthesia: General | Site: Ureter | Laterality: Left

## 2015-10-28 MED ORDER — SODIUM CHLORIDE 0.9 % IR SOLN
Status: DC | PRN
  Administered 2015-10-28: 3000 mL
  Administered 2015-10-28: 1000 mL

## 2015-10-28 MED ORDER — HYDROMORPHONE HCL 1 MG/ML IJ SOLN
INTRAMUSCULAR | Status: AC
  Administered 2015-10-28: 1 mg
  Filled 2015-10-28: qty 1

## 2015-10-28 MED ORDER — LACTATED RINGERS IV SOLN
INTRAVENOUS | Status: DC | PRN
  Administered 2015-10-28: 12:00:00 via INTRAVENOUS

## 2015-10-28 MED ORDER — FENTANYL CITRATE (PF) 100 MCG/2ML IJ SOLN
INTRAMUSCULAR | Status: DC | PRN
  Administered 2015-10-28: 50 ug via INTRAVENOUS

## 2015-10-28 MED ORDER — BELLADONNA ALKALOIDS-OPIUM 16.2-60 MG RE SUPP: 1.0000 | Freq: Three times a day (TID) | RECTAL | Status: DC | PRN

## 2015-10-28 MED ORDER — IOPAMIDOL (ISOVUE-300) INJECTION 61%
INTRAVENOUS | Status: AC
  Filled 2015-10-28: qty 50

## 2015-10-28 MED ORDER — OXYCODONE HCL 5 MG/5ML PO SOLN: 5.0000 mg | Freq: Once | ORAL | Status: AC | PRN

## 2015-10-28 MED ORDER — LIDOCAINE HCL 2 % EX GEL
CUTANEOUS | Status: AC
  Filled 2015-10-28: qty 5

## 2015-10-28 MED ORDER — MIDAZOLAM HCL 5 MG/5ML IJ SOLN
INTRAMUSCULAR | Status: DC | PRN
  Administered 2015-10-28 (×2): 1 mg via INTRAVENOUS

## 2015-10-28 MED ORDER — PHENAZOPYRIDINE HCL 200 MG PO TABS: 200.0000 mg | ORAL_TABLET | Freq: Three times a day (TID) | ORAL | Status: DC | PRN

## 2015-10-28 MED ORDER — CEFAZOLIN SODIUM-DEXTROSE 2-4 GM/100ML-% IV SOLN
2.0000 g | INTRAVENOUS | Status: AC
  Administered 2015-10-28: 2 g via INTRAVENOUS

## 2015-10-28 MED ORDER — ONDANSETRON HCL 4 MG/2ML IJ SOLN
INTRAMUSCULAR | Status: AC
  Filled 2015-10-28: qty 2

## 2015-10-28 MED ORDER — MEPERIDINE HCL 50 MG/ML IJ SOLN: 6.2500 mg | INTRAMUSCULAR | Status: DC | PRN

## 2015-10-28 MED ORDER — DEXAMETHASONE SODIUM PHOSPHATE 10 MG/ML IJ SOLN
INTRAMUSCULAR | Status: AC
  Filled 2015-10-28: qty 1

## 2015-10-28 MED ORDER — HYDROMORPHONE HCL 1 MG/ML IJ SOLN
INTRAMUSCULAR | Status: AC
  Administered 2015-10-28: 1 mg via INTRAVENOUS
  Filled 2015-10-28: qty 1

## 2015-10-28 MED ORDER — LACTATED RINGERS IV SOLN
INTRAVENOUS | Status: DC
  Administered 2015-10-28: 15:00:00 via INTRAVENOUS

## 2015-10-28 MED ORDER — ONDANSETRON HCL 4 MG/2ML IJ SOLN
INTRAMUSCULAR | Status: DC | PRN
  Administered 2015-10-28: 4 mg via INTRAVENOUS

## 2015-10-28 MED ORDER — PROPOFOL 10 MG/ML IV BOLUS
INTRAVENOUS | Status: AC
  Filled 2015-10-28: qty 20

## 2015-10-28 MED ORDER — OXYCODONE HCL 5 MG PO TABS
5.0000 mg | ORAL_TABLET | Freq: Once | ORAL | Status: AC | PRN
  Administered 2015-10-28: 5 mg via ORAL

## 2015-10-28 MED ORDER — PROPOFOL 10 MG/ML IV BOLUS
INTRAVENOUS | Status: DC | PRN
  Administered 2015-10-28: 200 mg via INTRAVENOUS

## 2015-10-28 MED ORDER — LIDOCAINE HCL (CARDIAC) 20 MG/ML IV SOLN
INTRAVENOUS | Status: DC | PRN
  Administered 2015-10-28: 90 mg via INTRAVENOUS

## 2015-10-28 MED ORDER — EPHEDRINE SULFATE 50 MG/ML IJ SOLN
INTRAMUSCULAR | Status: DC | PRN
  Administered 2015-10-28: 5 mg via INTRAVENOUS

## 2015-10-28 MED ORDER — DEXAMETHASONE SODIUM PHOSPHATE 10 MG/ML IJ SOLN
INTRAMUSCULAR | Status: DC | PRN
  Administered 2015-10-28: 10 mg via INTRAVENOUS

## 2015-10-28 MED ORDER — IOPAMIDOL (ISOVUE-300) INJECTION 61%
INTRAVENOUS | Status: DC | PRN
  Administered 2015-10-28: 8 mL

## 2015-10-28 MED ORDER — HYDROMORPHONE HCL 1 MG/ML IJ SOLN
0.2500 mg | INTRAMUSCULAR | Status: DC | PRN
  Administered 2015-10-28 (×4): 0.5 mg via INTRAVENOUS

## 2015-10-28 MED ORDER — OXYCODONE HCL 5 MG PO TABS
ORAL_TABLET | ORAL | Status: AC
  Filled 2015-10-28: qty 1

## 2015-10-28 MED ORDER — CEFAZOLIN SODIUM-DEXTROSE 2-4 GM/100ML-% IV SOLN
INTRAVENOUS | Status: AC
  Filled 2015-10-28: qty 100

## 2015-10-28 MED ORDER — BELLADONNA ALKALOIDS-OPIUM 16.2-60 MG RE SUPP
RECTAL | Status: AC
  Filled 2015-10-28: qty 1

## 2015-10-28 MED ORDER — FENTANYL CITRATE (PF) 100 MCG/2ML IJ SOLN
INTRAMUSCULAR | Status: AC
  Filled 2015-10-28: qty 2

## 2015-10-28 MED ORDER — MIDAZOLAM HCL 2 MG/2ML IJ SOLN
INTRAMUSCULAR | Status: AC
  Filled 2015-10-28: qty 2

## 2015-10-28 MED ORDER — LIDOCAINE HCL (CARDIAC) 20 MG/ML IV SOLN
INTRAVENOUS | Status: AC
  Filled 2015-10-28: qty 5

## 2015-10-28 SURGICAL SUPPLY — 23 items
BAG URO CATCHER STRL LF (MISCELLANEOUS) ×3 IMPLANT
BASKET DAKOTA 1.9FR 11X120 (BASKET) ×3 IMPLANT
BASKET LASER NITINOL 1.9FR (BASKET) IMPLANT
BASKET STONE NCOMPASS (UROLOGICAL SUPPLIES) IMPLANT
CATH URET 5FR 28IN OPEN ENDED (CATHETERS) ×3 IMPLANT
CATH URET DUAL LUMEN 6-10FR 50 (CATHETERS) IMPLANT
CLOTH BEACON ORANGE TIMEOUT ST (SAFETY) ×3 IMPLANT
FIBER LASER FLEXIVA 1000 (UROLOGICAL SUPPLIES) IMPLANT
FIBER LASER FLEXIVA 365 (UROLOGICAL SUPPLIES) ×3 IMPLANT
FIBER LASER FLEXIVA 550 (UROLOGICAL SUPPLIES) IMPLANT
GLOVE SURG SS PI 8.0 STRL IVOR (GLOVE) ×3 IMPLANT
GOWN STRL REUS W/TWL XL LVL3 (GOWN DISPOSABLE) ×6 IMPLANT
GUIDEWIRE STR DUAL SENSOR (WIRE) ×3 IMPLANT
IV NS 1000ML (IV SOLUTION) ×2
IV NS 1000ML BAXH (IV SOLUTION) ×1 IMPLANT
IV NS IRRIG 3000ML ARTHROMATIC (IV SOLUTION) ×3 IMPLANT
MANIFOLD NEPTUNE II (INSTRUMENTS) ×3 IMPLANT
PACK CYSTO (CUSTOM PROCEDURE TRAY) ×3 IMPLANT
SHEATH ACCESS URETERAL 38CM (SHEATH) ×3 IMPLANT
SHEATH URET ACCESS 10/12FR (MISCELLANEOUS) IMPLANT
STENT URET 6FRX24 CONTOUR (STENTS) ×3 IMPLANT
TUBING CONNECTING 10 (TUBING) ×2 IMPLANT
TUBING CONNECTING 10' (TUBING) ×1

## 2015-10-28 NOTE — Brief Op Note (Signed)
10/27/2015 - 10/28/2015  1:15 PM  PATIENT:  Jill Anderson  50 y.o. female  PRE-OPERATIVE DIAGNOSIS:  left proximal ureteral stone.  POST-OPERATIVE DIAGNOSIS:  left proximal ureteral stone.  PROCEDURE:  Procedure(s): CYSTOSCOPY WITH RETROGRADE PYELOGRAM, URETEROSCOPY AND STENT PLACEMENT, LASER (Left)  SURGEON:  Surgeon(s) and Role:    * Bjorn PippinJohn Mardee Clune, MD - Primary  PHYSICIAN ASSISTANT:   ASSISTANTS: none   ANESTHESIA:   general  EBL:  Total I/O In: -  Out: 800 [Urine:800]  BLOOD ADMINISTERED:none  DRAINS: 6 x 24 fr JJ stent with tether, left   LOCAL MEDICATIONS USED:  NONE  SPECIMEN:  Source of Specimen:  stone fragments  DISPOSITION OF SPECIMEN:  to family to bring to the office  COUNTS:  YES  TOURNIQUET:  * No tourniquets in log *  DICTATION: .Other Dictation: Dictation Number [id number]  PLAN OF CARE: Admit for overnight observation  PATIENT DISPOSITION:  PACU - hemodynamically stable.   Delay start of Pharmacological VTE agent (>24hrs) due to surgical blood loss or risk of bleeding: not applicable

## 2015-10-28 NOTE — Progress Notes (Signed)
Spoke with Dr Annabell HowellsWrenn about patient staying the night after surgery. Still having intense spasms and discomfort. Orders obtained. Melton Alarana A Bricyn Labrada, RN

## 2015-10-28 NOTE — Plan of Care (Signed)
Problem: Pain Managment: Goal: General experience of comfort will improve Outcome: Completed/Met Date Met:  10/28/15 Pts pain goal has been met.

## 2015-10-28 NOTE — Anesthesia Postprocedure Evaluation (Signed)
Anesthesia Post Note  Patient: Jill [name]  Procedure(s) Performed: Procedure(s) (LRB): CYSTOSCOPY WITH RETROGRADE PYELOGRAM, URETEROSCOPY AND STENT PLACEMENT, AND LASER (Left)  Patient location during evaluation: PACU Anesthesia Type: General Level of consciousness: awake and alert Pain management: pain level controlled Vital Signs Assessment: post-procedure vital signs reviewed and stable Respiratory status: spontaneous breathing, nonlabored ventilation and respiratory function stable Cardiovascular status: blood pressure returned to baseline and stable Postop Assessment: no signs of nausea or vomiting Anesthetic complications: no    Last Vitals:  Filed Vitals:   10/27/15 2138 10/28/15 0445  BP: 134/63 113/51  Pulse: 72 71  Temp: 36.6 C 36.8 C  Resp: 18 20    Last Pain:  Filed Vitals:   10/28/15 0446  PainSc: 4                  Para Cossey A

## 2015-10-28 NOTE — Addendum Note (Signed)
Addendum  created 10/28/15 1645 by Sheldon Silvanavid Tkai Serfass, MD   Modules edited: Clinical Notes   Clinical Notes:  File: [id number]

## 2015-10-28 NOTE — Discharge Instructions (Signed)
CYSTOSCOPY HOME CARE INSTRUCTIONS  Activity: Rest for the remainder of the day.  Do not drive or operate equipment today.  You may resume normal activities in one to two days as instructed by your physician.   Meals: Drink plenty of liquids and eat light foods such as gelatin or soup this evening.  You may return to a normal meal plan tomorrow.  Return to Work: You may return to work in one to two days or as instructed by your physician.  Special Instructions / Symptoms: Call your physician if any of these symptoms occur:   -persistent or heavy bleeding  -bleeding which continues after first few urination  -large blood clots that are difficult to pass  -urine stream diminishes or stops completely  -fever equal to or higher than 101 degrees Farenheit.  -cloudy urine with a strong, foul odor  -severe pain  Females should always wipe from front to back after elimination.  You may feel some burning pain when you urinate.  This should disappear with time.  Applying moist heat to the lower abdomen or a hot tub bath may help relieve the pain. \  I placed a ureteral stent from the kidney to the bladder and there is a string coming out of the urethra that will be used to remove the stent.     You may remove the stent by pulling the attached string on Thursday morning and if you don't feel comfortable doing that please contact the office.  Bring your stones to the office at f/u.   Patient Signature:  ________________________________________________________  Nurse's Signature:  ________________________________________________________

## 2015-10-28 NOTE — Anesthesia Preprocedure Evaluation (Addendum)
Anesthesia Evaluation  Patient identified by MRN, date of birth, ID band Patient awake    Reviewed: Allergy & Precautions, NPO status , Patient's Chart, lab work & pertinent test results  Airway Mallampati: I  TM Distance: >3 FB Neck ROM: Full    Dental  (+) Teeth Intact, Dental Advisory Given   Pulmonary    breath sounds clear to auscultation       Cardiovascular  Rhythm:Regular Rate:Normal     Neuro/Psych    GI/Hepatic   Endo/Other  diabetes, Well Controlled, Type 2, Oral Hypoglycemic AgentsMorbid obesity  Renal/GU      Musculoskeletal   Abdominal   Peds  Hematology   Anesthesia Other Findings   Reproductive/Obstetrics                          Anesthesia Physical Anesthesia Plan  ASA: III  Anesthesia Plan: General   Post-op Pain Management:    Induction: Intravenous  Airway Management Planned: LMA  Additional Equipment:   Intra-op Plan:   Post-operative Plan: Extubation in OR  Informed Consent: I have reviewed the patients History and Physical, chart, labs and discussed the procedure including the risks, benefits and alternatives for the proposed anesthesia with the patient or authorized representative who has indicated his/her understanding and acceptance.   Dental advisory given  Plan Discussed with: CRNA, Anesthesiologist and Surgeon  Anesthesia Plan Comments:         Anesthesia Quick Evaluation

## 2015-10-28 NOTE — Anesthesia Procedure Notes (Signed)
Procedure Name: LMA Insertion Date/Time: 10/28/2015 12:46 PM Performed by: Jarvis NewcomerARMISTEAD, Pearl Berlinger A Pre-anesthesia Checklist: Patient identified, Emergency Drugs available, Suction available, Patient being monitored and Timeout performed Patient Re-evaluated:Patient Re-evaluated prior to inductionOxygen Delivery Method: Circle system utilized Preoxygenation: Pre-oxygenation with 100% oxygen Intubation Type: IV induction Ventilation: Mask ventilation without difficulty LMA: LMA with gastric port inserted LMA Size: 4.0 Number of attempts: 1 Placement Confirmation: positive ETCO2 and breath sounds checked- equal and bilateral Tube secured with: Tape Dental Injury: Teeth and Oropharynx as per pre-operative assessment

## 2015-10-28 NOTE — Progress Notes (Signed)
Patient ID: Jill Anderson, female   DOB: Jul 24, 1965, 50 y.o.   MRN: [id number]    Subjective: Jill Anderson is feeling better this morning but the KUB shows the stone in the area of the left proximal ureter.   She has no nausea or voiding complaints.  ROS:  Review of Systems  Constitutional: Negative for fever and chills.  Gastrointestinal: Negative for abdominal pain.  Genitourinary: Negative for urgency, hematuria and flank pain.  All other systems reviewed and are negative.   Anti-infectives: Anti-infectives    Start     Dose/Rate Route Frequency Ordered Stop   10/28/15 0827  ceFAZolin (ANCEF) IVPB 2g/100 mL premix     2 g 200 mL/hr over 30 Minutes Intravenous 30 min pre-op 10/28/15 0827        Current Facility-Administered Medications  Medication Dose Route Frequency Provider Last Rate Last Dose  . 0.45 % NaCl with KCl 20 mEq / L infusion   Intravenous Continuous Bjorn PippinJohn Dayten Juba, MD 100 mL/hr at 10/28/15 0425    . acetaminophen (TYLENOL) tablet 650 mg  650 mg Oral Q4H PRN Bjorn PippinJohn Carron Jaggi, MD      . bisacodyl (DULCOLAX) suppository 10 mg  10 mg Rectal Daily PRN Bjorn PippinJohn Zipporah Finamore, MD      . ceFAZolin (ANCEF) IVPB 2g/100 mL premix  2 g Intravenous 30 min Pre-Op Bjorn PippinJohn Ronald Vinsant, MD      . diphenhydrAMINE (BENADRYL) injection 12.5-25 mg  12.5-25 mg Intravenous Q6H PRN Bjorn PippinJohn Deliah Strehlow, MD       Or  . diphenhydrAMINE (BENADRYL) 12.5 MG/5ML elixir 12.5-25 mg  12.5-25 mg Oral Q6H PRN Bjorn PippinJohn Andreika Vandagriff, MD      . HYDROmorphone (DILAUDID) injection 0.5-1 mg  0.5-1 mg Intravenous Q2H PRN Bjorn PippinJohn Loris Seelye, MD      . insulin aspart (novoLOG) injection 0-15 Units  0-15 Units Subcutaneous TID WC Bjorn PippinJohn Esperanza Madrazo, MD      . ketorolac (TORADOL) 15 MG/ML injection 15 mg  15 mg Intravenous Q6H PRN Bjorn PippinJohn Derreon Consalvo, MD      . ondansetron [facility](ZOFRAN) injection 4 mg  4 mg Intravenous Q4H PRN Bjorn PippinJohn Demone Lyles, MD      . oxyCODONE-acetaminophen (PERCOCET/ROXICET) 5-325 MG per tablet 1-2 tablet  1-2 tablet Oral Q4H PRN Bjorn PippinJohn Antoney Biven, MD      . pregabalin (LYRICA) capsule 75  mg  75 mg Oral BID Bjorn PippinJohn Helios Kohlmann, MD   75 mg at 10/27/15 2200  . tamsulosin (FLOMAX) capsule 0.4 mg  0.4 mg Oral Daily Bjorn PippinJohn Somya Jauregui, MD   0.4 mg at 10/27/15 1831  . zolpidem (AMBIEN) tablet 5 mg  5 mg Oral QHS PRN Bjorn PippinJohn Reid Nawrot, MD         Objective: Vital signs in last 24 hours: Temp:  [97.8 F (36.6 C)-99.3 F (37.4 C)] 98.3 F (36.8 C) (05/29 0445) Pulse Rate:  [62-78] 71 (05/29 0445) Resp:  [14-23] 20 (05/29 0445) BP: (113-134)/(51-79) 113/51 mmHg (05/29 0445) SpO2:  [91 %-100 %] 96 % (05/29 0445) Weight:  [78.472 kg (173 lb)-79.561 kg (175 lb 6.4 oz)] 79.561 kg (175 lb 6.4 oz) (05/28 1611)  Intake/Output from previous day: 05/28 0701 - 05/29 0700 In: 1475 [P.O.:480; I.V.:995] Out: 2250 [Urine:2250] Intake/Output this shift:     Physical Exam  Constitutional: She is well-developed, well-nourished, and in no distress.  Cardiovascular: Normal rate and regular rhythm.   Pulmonary/Chest: Effort normal. No respiratory distress.  Abdominal: Soft. There is tenderness (mild LUQ ).    Lab Results:   Recent Labs  10/25/15 2108 10/27/15 1220  WBC 8.5 10.0  HGB 14.4 13.3  HCT 44.3 39.8  PLT 350 284   BMET  Recent Labs  10/27/15 1220 10/28/15 0401  NA 133* 140  K 3.8 3.9  CL 99* 107  CO2 26 26  GLUCOSE 95 101*  BUN 14 12  CREATININE 1.22* 0.85  CALCIUM 9.2 8.8*   PT/INR No results for input(s): LABPROT, INR in the last 72 hours. ABG No results for input(s): PHART, HCO3 in the last 72 hours.  Invalid input(s): PCO2, PO2  Studies/Results: Dg Chest 2 View  10/27/2015  CLINICAL DATA:  Per pt, states she was diagnosed with small kidney stone last week-states increased pain-has not followed up with urology. No chest complaints. EXAM: CHEST  2 VIEW COMPARISON:  04/06/2015 FINDINGS: Midline trachea.  Normal heart size and mediastinal contours. Sharp costophrenic angles.  No pneumothorax.  Clear lungs. Mild right hemidiaphragm elevation. IMPRESSION: No active cardiopulmonary  disease. Electronically Signed   By: Jeronimo Greaves M.D.   On: 10/27/2015 12:28   Abdomen 1 View (kub)  10/28/2015  CLINICAL DATA:  Followup left ureteral stone EXAM: ABDOMEN - 1 VIEW COMPARISON:  10/27/2015 FINDINGS: There is a radiopaque density between the transverse processes on the left at L4 and L5 better visualized than that seen on the prior exam which likely represents the known left ureteral stone. Multiple phleboliths are again seen in the pelvis and stable. No other focal abnormality is noted. IMPRESSION: Calcification to the left of the midline likely representing a mid left ureteral stone. Electronically Signed   By: Alcide Clever M.D.   On: 10/28/2015 08:06   Dg Abd 1 View  10/27/2015  CLINICAL DATA:  Patient with recent diagnosis of nephrolithiasis. Worsening pain. EXAM: ABDOMEN - 1 VIEW COMPARISON:  Renal stone CT 10/13/2015. FINDINGS: Multiple rounded calcifications are demonstrated within the pelvis, compatible with phleboliths. Nonobstructed bowel gas pattern. Stool within the cecum. Lumbar spine degenerative changes. No definite calcification identified within the left hemi abdomen. IMPRESSION: No definite calcification identified in the left hemi abdomen correspond with small left renal stone identified on prior CT. Electronically Signed   By: Annia Belt M.D.   On: 10/27/2015 12:39   US Renal  10/27/2015  CLINICAL DATA:  Patient with left flank pain for 2 weeks. EXAM: RENAL / URINARY TRACT ULTRASOUND COMPLETE COMPARISON:  Abdominal radiograph earlier same day; CT abdomen pelvis 10/13/2015 FINDINGS: Right Kidney: Length: 13.3 cm. Echogenicity within normal limits. No mass or hydronephrosis visualized. Left Kidney: Length: 13.4 cm. There is mild left hydronephrosis. Normal renal cortical thickness and echogenicity. No definite left ureteral jet is visualized. Bladder: Appears normal for degree of bladder distention. Liver is increased in echogenicity. IMPRESSION: Mild left hydronephrosis.  No left ureteral jet visualized within the urinary bladder. Hepatic steatosis. Electronically Signed   By: Annia Belt M.D.   On: 10/27/2015 14:06   KUB films and labs reviewed.   Her Cr has improved.   Assessment and Plan: She is feeling better but the stone appears to remain in the proximal ureter.   It is faintly visible and will be best managed endoscopically.   I had given her consent yesterday and will get her set up for ureteroscopy today.           Ysela Hettinger J 10/28/2015 [phone]

## 2015-10-28 NOTE — Transfer of Care (Signed)
Immediate Anesthesia Transfer of Care Note  Patient: [name]  Procedure(s) Performed: Procedure(s): CYSTOSCOPY WITH RETROGRADE PYELOGRAM, URETEROSCOPY AND STENT PLACEMENT, AND LASER (Left)  Patient Location: PACU  Anesthesia Type:General  Level of Consciousness: awake, alert , oriented and patient cooperative  Airway & Oxygen Therapy: Patient Spontanous Breathing and Patient connected to face mask oxygen  Post-op Assessment: Report given to RN, Post -op Vital signs reviewed and stable and Patient moving all extremities  Post vital signs: Reviewed and stable  Last Vitals:  Filed Vitals:   10/27/15 2138 10/28/15 0445  BP: 134/63 113/51  Pulse: 72 71  Temp: 36.6 C 36.8 C  Resp: 18 20    Last Pain:  Filed Vitals:   10/28/15 0446  PainSc: 4          Complications: No apparent anesthesia complications

## 2015-10-29 ENCOUNTER — Encounter (HOSPITAL_COMMUNITY): Payer: Self-pay | Admitting: Urology

## 2015-10-29 LAB — GLUCOSE, CAPILLARY
GLUCOSE-CAPILLARY: 177 mg/dL — AB (ref 65–99)
Glucose-Capillary: 192 mg/dL — ABNORMAL HIGH (ref 65–99)

## 2015-10-29 MED ORDER — BELLADONNA ALKALOIDS-OPIUM 16.2-60 MG RE SUPP: 1.0000 | Freq: Three times a day (TID) | RECTAL | Status: DC | PRN

## 2015-10-29 MED ORDER — HYOSCYAMINE SULFATE 0.125 MG SL SUBL: 0.1250 mg | SUBLINGUAL_TABLET | SUBLINGUAL | Status: DC | PRN

## 2015-10-29 MED ORDER — OXYCODONE HCL 5 MG PO TABS: 5.0000 mg | ORAL_TABLET | ORAL | Status: DC | PRN

## 2015-10-29 NOTE — Discharge Summary (Signed)
Physician Discharge Summary  Patient ID: Jill Anderson MRN: [id number] DOB/AGE: [birth year] 50 y.o.  Admit date: 10/27/2015 Discharge date: 10/29/2015  Admission Diagnoses:  Left ureteral stone  Discharge Diagnoses:  Principal Problem:   Left ureteral stone Active Problems:   Renal colic on left side   Past Medical History  Diagnosis Date  . Borderline diabetic   . Diabetes mellitus without complication (HCC)     Surgeries: Procedure(s): CYSTOSCOPY WITH RETROGRADE PYELOGRAM, URETEROSCOPY AND STENT PLACEMENT, AND LASER on 10/27/2015 - 10/28/2015   Consultants (if any):    Discharged Condition: Improved  Hospital Course: Jill Anderson is an 50 y.o. female who was admitted 10/27/2015 with a diagnosis of Left ureteral stone and went to the operating room on 10/27/2015 - 10/28/2015 and underwent the above named procedures.  She had some postop pain and bladder spasms from the stent and was keep overnight but is doing better today an is felt to be ready for discharge.   She was given perioperative antibiotics:  Anti-infectives    Start     Dose/Rate Route Frequency Ordered Stop   10/28/15 0827  ceFAZolin (ANCEF) IVPB 2g/100 mL premix     2 g 200 mL/hr over 30 Minutes Intravenous 30 min pre-op 10/28/15 0827 10/28/15 1248    .  She was given sequential compression devices for DVT prophylaxis.  She benefited maximally from the hospital stay and there were no complications.    Recent vital signs:  Filed Vitals:   10/28/15 1538 10/29/15 0558  BP: 143/64 134/60  Pulse: 62 62  Temp: 97.8 F (36.6 C) 98 F (36.7 C)  Resp: 16 18    Recent laboratory studies:  Lab Results  Component Value Date   HGB 13.3 10/27/2015   HGB 14.4 10/25/2015   HGB 14.3 10/13/2015   Lab Results  Component Value Date   WBC 10.0 10/27/2015   PLT 284 10/27/2015   No results found for: INR Lab Results  Component Value Date   NA 140 10/28/2015   K 3.9 10/28/2015   CL 107 10/28/2015   CO2 26  10/28/2015   BUN 12 10/28/2015   CREATININE 0.85 10/28/2015   GLUCOSE 101* 10/28/2015    Discharge Medications:     Medication List    STOP taking these medications        HYDROcodone-acetaminophen 5-325 MG tablet  Commonly known as:  NORCO/VICODIN      TAKE these medications        aspirin EC 325 MG tablet  Take 1 tablet (325 mg total) by mouth daily.     atorvastatin 40 MG tablet  Commonly known as:  LIPITOR  Take 1 tablet (40 mg total) by mouth daily.     Cane Misc  1 each by Does not apply route as needed.     hyoscyamine 0.125 MG SL tablet  Commonly known as:  LEVSIN/SL  Place 1 tablet (0.125 mg total) under the tongue every 4 (four) hours as needed.     ibuprofen 800 MG tablet  Commonly known as:  ADVIL,MOTRIN  Take 1 tablet (800 mg total) by mouth 3 (three) times daily.     ibuprofen 600 MG tablet  Commonly known as:  ADVIL,MOTRIN  Take 1 tablet (600 mg total) by mouth every 6 (six) hours as needed.     metFORMIN 500 MG tablet  Commonly known as:  GLUCOPHAGE  Take 1 tablet (500 mg total) by mouth 2 (two) times daily with a meal.  naproxen 500 MG tablet  Commonly known as:  NAPROSYN  Take 1 tablet (500 mg total) by mouth 2 (two) times daily.     ondansetron 4 MG disintegrating tablet  Commonly known as:  ZOFRAN ODT  Take 1 tablet (4 mg total) by mouth every 4 (four) hours as needed for nausea or vomiting.     ondansetron 4 MG disintegrating tablet  Commonly known as:  ZOFRAN ODT  Take 1 tablet (4 mg total) by mouth every 8 (eight) hours as needed for nausea or vomiting.     opium-belladonna 16.2-60 MG suppository  Commonly known as:  B&O SUPPRETTES  Place 1 suppository rectally every 8 (eight) hours as needed for bladder spasms.     oxyCODONE 5 MG immediate release tablet  Commonly known as:  ROXICODONE  Take 1 tablet (5 mg total) by mouth every 4 (four) hours as needed for severe pain.     oxyCODONE-acetaminophen 5-325 MG tablet  Commonly  known as:  PERCOCET/ROXICET  Take 1-2 tablets by mouth every 4 (four) hours as needed for severe pain.     phenazopyridine 200 MG tablet  Commonly known as:  PYRIDIUM  Take 1 tablet (200 mg total) by mouth 3 (three) times daily as needed for pain.     pregabalin 75 MG capsule  Commonly known as:  LYRICA  Take 1 capsule (75 mg total) by mouth 2 (two) times daily.     tamsulosin 0.4 MG Caps capsule  Commonly known as:  FLOMAX  Take 1 capsule (0.4 mg total) by mouth daily.     traMADol 50 MG tablet  Commonly known as:  ULTRAM  Take 1 tablet (50 mg total) by mouth every 6 (six) hours as needed.        Diagnostic Studies: Dg Chest 2 View  10/27/2015  CLINICAL DATA:  Per pt, states she was diagnosed with small kidney stone last week-states increased pain-has not followed up with urology. No chest complaints. EXAM: CHEST  2 VIEW COMPARISON:  04/06/2015 FINDINGS: Midline trachea.  Normal heart size and mediastinal contours. Sharp costophrenic angles.  No pneumothorax.  Clear lungs. Mild right hemidiaphragm elevation. IMPRESSION: No active cardiopulmonary disease. Electronically Signed   By: Jeronimo Greaves M.D.   On: 10/27/2015 12:28   Abdomen 1 View (kub)  10/28/2015  CLINICAL DATA:  Followup left ureteral stone EXAM: ABDOMEN - 1 VIEW COMPARISON:  10/27/2015 FINDINGS: There is a radiopaque density between the transverse processes on the left at L4 and L5 better visualized than that seen on the prior exam which likely represents the known left ureteral stone. Multiple phleboliths are again seen in the pelvis and stable. No other focal abnormality is noted. IMPRESSION: Calcification to the left of the midline likely representing a mid left ureteral stone. Electronically Signed   By: Alcide Clever M.D.   On: 10/28/2015 08:06   Dg Abd 1 View  10/27/2015  CLINICAL DATA:  Patient with recent diagnosis of nephrolithiasis. Worsening pain. EXAM: ABDOMEN - 1 VIEW COMPARISON:  Renal stone CT 10/13/2015.  FINDINGS: Multiple rounded calcifications are demonstrated within the pelvis, compatible with phleboliths. Nonobstructed bowel gas pattern. Stool within the cecum. Lumbar spine degenerative changes. No definite calcification identified within the left hemi abdomen. IMPRESSION: No definite calcification identified in the left hemi abdomen correspond with small left renal stone identified on prior CT. Electronically Signed   By: Annia Belt M.D.   On: 10/27/2015 12:39   US Renal  10/27/2015  CLINICAL DATA:  Patient with left flank pain for 2 weeks. EXAM: RENAL / URINARY TRACT ULTRASOUND COMPLETE COMPARISON:  Abdominal radiograph earlier same day; CT abdomen pelvis 10/13/2015 FINDINGS: Right Kidney: Length: 13.3 cm. Echogenicity within normal limits. No mass or hydronephrosis visualized. Left Kidney: Length: 13.4 cm. There is mild left hydronephrosis. Normal renal cortical thickness and echogenicity. No definite left ureteral jet is visualized. Bladder: Appears normal for degree of bladder distention. Liver is increased in echogenicity. IMPRESSION: Mild left hydronephrosis. No left ureteral jet visualized within the urinary bladder. Hepatic steatosis. Electronically Signed   By: Annia Belt M.D.   On: 10/27/2015 14:06   Mr Maxine Glenn Head/brain Wo Cm  10/09/2015  CLINICAL DATA:  New onset left temple Headache with abnormal sensation in the left side of the face. Symptoms for 1 week. EXAM: MRA HEAD WITHOUT CONTRAST TECHNIQUE: Angiographic images of the Circle of Willis were obtained using MRA technique without intravenous contrast. COMPARISON:  None. FINDINGS: The visualized distal vertebral arteries are widely patent and codominant. Left PICA origin is patent. Right AICA is dominant. SCA origins are patent. Basilar artery is widely patent. There are patent posterior communicating arteries bilaterally. There is suggestion of mild distal right P1 stenosis. The internal carotid arteries are patent from skullbase to carotid  termini without stenosis. ACAs and MCAs are patent without evidence of major branch occlusion or significant stenosis. No intracranial aneurysm is identified. IMPRESSION: 1. Mild proximal right PCA stenosis. 2. No intracranial aneurysm or flow limiting stenosis. Electronically Signed   By: Sebastian Ache M.D.   On: 10/09/2015 17:56   Ct Renal Stone Study  10/13/2015  CLINICAL DATA:  Left flank pain and hematuria for 1 week. EXAM: CT ABDOMEN AND PELVIS WITHOUT CONTRAST TECHNIQUE: Multidetector CT imaging of the abdomen and pelvis was performed following the standard protocol without IV contrast. COMPARISON:  None. FINDINGS: Two tiny nodules demonstrated in the left lung base, each measuring about 2 mm in diameter. Possible calcification in 1 of the nodules. No follow-up needed if patient is low-risk (and has no known or suspected primary neoplasm). Non-contrast chest CT can be considered in 12 months if patient is high-risk. This recommendation follows the consensus statement: Guidelines for Management of Incidental Pulmonary Nodules Detected on CT Images:From the Fleischner Society 2017; published online before print (10.1148/radiol.4540981191). There is a 3.5 mm stone in the left ureteropelvic junction with mild proximal hydronephrosis in the left kidney. Distal left ureter is decompressed. No additional renal or ureteral stones are demonstrated. No bladder stones are demonstrated. Bladder wall is not thickened. Mild diffuse fatty infiltration of the liver. The gallbladder, pancreas, spleen, adrenal glands, abdominal aorta, inferior vena cava, and retroperitoneal lymph nodes are unremarkable. Stomach, small bowel, and colon are not abnormally distended. Scattered stool in the colon. No free air or free fluid in the abdomen. Small umbilical hernia containing fat. Pelvis: Appendix is normal. Uterus is surgically absent. No pelvic mass or lymphadenopathy. No free or loculated pelvic fluid collections. Mild prominence  of lymph nodes in the groin regions bilaterally, likely inflammatory or reactive. No destructive bone lesions. Mild degenerative changes of the lumbosacral interspace. IMPRESSION: 3.5 mm cm within the left ureteropelvic junction with mild proximal obstruction. Mild diffuse fatty infiltration of the liver. Two nodules in the left lung base, each measuring about 2 mm diameter. Electronically Signed   By: Burman Nieves M.D.   On: 10/13/2015 23:12    Disposition: 01-Home or Self Care        Follow-up Information  Follow up with Anner CreteWRENN,Delmo Matty J, MD.   Specialty:  Urology   Why:  Please call the office to make a f/u appointment to see me or one of the nurse practitioners in 1-3 weeks and bring your stones to the office.    Contact information:   [address] [place] [address] [phone]        Signed: Anner CreteWRENN,Mendel Binsfeld J 10/29/2015, 6:54 AM

## 2015-10-29 NOTE — Op Note (Signed)
NAMCarolina Anderson:  Anderson, Jill                ACCOUNT NO.:  [id number]  MEDICAL RECORD NO.:  [id number]  LOCATION:  [id number]                         [facility]  PHYSICIAN:  Excell SeltzerJohn J. Annabell HowellsWrenn, M.D.    DATE OF BIRTH:  1966/03/12  DATE OF PROCEDURE:  10/28/2015 DATE OF DISCHARGE:                              OPERATIVE REPORT   PROCEDURES: 1. Cystoscopy with left retrograde pyelogram and interpretation. 2. Left ureteroscopy with holmium laser lithotripsy and insertion of     left double-J stent.  PREOPERATIVE DIAGNOSIS:  Left proximal ureteral stone.  POSTOPERATIVE DIAGNOSIS:  Left proximal ureteral stone.  SURGEON:  Excell SeltzerJohn J. Annabell HowellsWrenn, M.D.  ANESTHESIA:  General.  SPECIMEN:  Stone.  DRAINS:  A 6-French x 24-cm left double-J stent.  COMPLICATIONS:  None.  INDICATIONS:  Ms. Jill Anderson is a 50 year old female with a 5-mm left proximal stone that has failed to progress, she has elected to ureteroscopy.  FINDINGS OF PROCEDURE:  She was given Ancef.  She was taken to the operating room where general anesthetic was induced.  She was placed in lithotomy position.  Her perineum and genitalia were prepped with Betadine solution and she was draped in usual sterile fashion.  Cystoscopy was performed using the 23-French scope and the 30-degree lens.  Examination revealed a normal urethra and bladder.  The ureteral orifices were unremarkable as well.  The left ureteral orifice was cannulated with a 5-French open-ended catheter and contrast was instilled.  Left retrograde pyelogram revealed a normal caliber ureter with a filling defect in the proximal ureter consistent with a stone and minimal dilation proximal to that.  Once the retrograde pyelogram was complete, a guidewire was passed to the kidney and a 38-cm digital access sheath 12-French inner-core was passed to the kidney without difficulty.  The 4.5-French semirigid ureteroscope was then passed alongside the wire and that had been placed after  the retrograde pyelogram and the stone was visualized.  It was engaged with a 365 micron laser fiber on 0.5 watts and 10 hertz and broken into manageable fragments.  The fragments were then removed with the [place] basket and once all significant fragments were removed, the cystoscope was reinserted over the wire and a 6-French 24-cm Contour double-J stent with tether was passed to the kidney under fluoroscopic guidance.  The wire was removed leaving good coil in the kidney and good coil in the bladder.  The bladder was drained.  The cystoscope was removed leaving the stent string exiting the urethra.  The string was tied close to the meatus, now trimmed and tucked vaginally.  The patient's drapes were removed. She was taken down from lithotomy position.  Her anesthetic was reversed.  She was moved to the recovery room in stable condition.  She will be given her stone fragments to bring to the office for analysis. There were no complications.     Excell SeltzerJohn J. Annabell HowellsWrenn, M.D.     JJW/MEDQ  D:  10/28/2015  T:  10/29/2015  Job:  [id number]

## 2015-11-05 ENCOUNTER — Ambulatory Visit: Payer: Medicaid Other | Attending: Family Medicine | Admitting: Family Medicine

## 2015-11-05 ENCOUNTER — Encounter: Payer: Self-pay | Admitting: Family Medicine

## 2015-11-05 VITALS — BP 115/73 | HR 77 | Temp 98.4°F | Resp 16 | Ht 62.0 in | Wt 171.0 lb

## 2015-11-05 DIAGNOSIS — Z79899 Other long term (current) drug therapy: Secondary | ICD-10-CM | POA: Insufficient documentation

## 2015-11-05 DIAGNOSIS — Z9889 Other specified postprocedural states: Secondary | ICD-10-CM | POA: Diagnosis not present

## 2015-11-05 DIAGNOSIS — Z90710 Acquired absence of both cervix and uterus: Secondary | ICD-10-CM | POA: Insufficient documentation

## 2015-11-05 DIAGNOSIS — Z7982 Long term (current) use of aspirin: Secondary | ICD-10-CM | POA: Insufficient documentation

## 2015-11-05 DIAGNOSIS — Z7984 Long term (current) use of oral hypoglycemic drugs: Secondary | ICD-10-CM | POA: Diagnosis not present

## 2015-11-05 DIAGNOSIS — E119 Type 2 diabetes mellitus without complications: Secondary | ICD-10-CM | POA: Insufficient documentation

## 2015-11-05 DIAGNOSIS — N201 Calculus of ureter: Secondary | ICD-10-CM | POA: Diagnosis not present

## 2015-11-05 DIAGNOSIS — E1169 Type 2 diabetes mellitus with other specified complication: Secondary | ICD-10-CM

## 2015-11-05 LAB — POCT URINALYSIS DIPSTICK
Bilirubin, UA: NEGATIVE
Blood, UA: NEGATIVE
Glucose, UA: NEGATIVE
Ketones, UA: NEGATIVE
NITRITE UA: NEGATIVE
PH UA: 5
PROTEIN UA: NEGATIVE
Spec Grav, UA: 1.02
UROBILINOGEN UA: 0.2

## 2015-11-05 LAB — GLUCOSE, POCT (MANUAL RESULT ENTRY): POC GLUCOSE: 79 mg/dL (ref 70–99)

## 2015-11-05 NOTE — Patient Instructions (Addendum)
[name] was seen today for post-op problem.  Diagnoses and all orders for this visit:  Type 2 diabetes mellitus with other specified complication (HCC) -     POCT glucose (manual entry)  Left ureteral stone -     POCT urinalysis dipstick   Filled out form for disability   Reschedule neurosurgery appointment as planned  F/u in 3 months sooner if needed   Dr. Armen PickupFunches

## 2015-11-05 NOTE — Progress Notes (Signed)
AF/U kidney stone surgery  Chronic Back and shoulder pain  Pain scale #8 No suicidal thoughts in the past two weeks
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
## 2015-11-08 ENCOUNTER — Encounter: Payer: Self-pay | Admitting: Family Medicine

## 2015-11-15 DIAGNOSIS — M5137 Other intervertebral disc degeneration, lumbosacral region: Secondary | ICD-10-CM | POA: Insufficient documentation

## 2015-11-15 DIAGNOSIS — M79604 Pain in right leg: Secondary | ICD-10-CM | POA: Insufficient documentation

## 2016-01-06 ENCOUNTER — Ambulatory Visit: Payer: Medicaid Other | Attending: Family Medicine | Admitting: Family Medicine

## 2016-01-06 ENCOUNTER — Encounter: Payer: Self-pay | Admitting: Family Medicine

## 2016-01-06 VITALS — BP 128/79 | HR 83 | Temp 98.3°F | Ht 62.0 in | Wt 175.2 lb

## 2016-01-06 DIAGNOSIS — Z79899 Other long term (current) drug therapy: Secondary | ICD-10-CM | POA: Diagnosis not present

## 2016-01-06 DIAGNOSIS — Z7982 Long term (current) use of aspirin: Secondary | ICD-10-CM | POA: Diagnosis not present

## 2016-01-06 DIAGNOSIS — I10 Essential (primary) hypertension: Secondary | ICD-10-CM | POA: Diagnosis not present

## 2016-01-06 DIAGNOSIS — E1169 Type 2 diabetes mellitus with other specified complication: Secondary | ICD-10-CM

## 2016-01-06 DIAGNOSIS — M5010 Cervical disc disorder with radiculopathy, unspecified cervical region: Secondary | ICD-10-CM | POA: Diagnosis not present

## 2016-01-06 DIAGNOSIS — M5416 Radiculopathy, lumbar region: Secondary | ICD-10-CM

## 2016-01-06 DIAGNOSIS — M545 Low back pain: Secondary | ICD-10-CM | POA: Diagnosis not present

## 2016-01-06 DIAGNOSIS — E118 Type 2 diabetes mellitus with unspecified complications: Secondary | ICD-10-CM | POA: Insufficient documentation

## 2016-01-06 DIAGNOSIS — M5412 Radiculopathy, cervical region: Secondary | ICD-10-CM | POA: Diagnosis not present

## 2016-01-06 DIAGNOSIS — M542 Cervicalgia: Secondary | ICD-10-CM | POA: Diagnosis not present

## 2016-01-06 DIAGNOSIS — G8929 Other chronic pain: Secondary | ICD-10-CM

## 2016-01-06 DIAGNOSIS — Z791 Long term (current) use of non-steroidal anti-inflammatories (NSAID): Secondary | ICD-10-CM | POA: Diagnosis not present

## 2016-01-06 DIAGNOSIS — M5410 Radiculopathy, site unspecified: Secondary | ICD-10-CM

## 2016-01-06 LAB — GLUCOSE, POCT (MANUAL RESULT ENTRY): POC GLUCOSE: 136 mg/dL — AB (ref 70–99)

## 2016-01-06 LAB — POCT GLYCOSYLATED HEMOGLOBIN (HGB A1C): Hemoglobin A1C: 5.9

## 2016-01-06 MED ORDER — TRAMADOL HCL 50 MG PO TABS: 50.0000 mg | ORAL_TABLET | Freq: Four times a day (QID) | ORAL | 2 refills | Status: DC | PRN

## 2016-01-06 MED ORDER — DIAZEPAM 5 MG PO TABS: 5.0000 mg | ORAL_TABLET | Freq: Two times a day (BID) | ORAL | 1 refills | Status: DC | PRN

## 2016-01-06 NOTE — Progress Notes (Signed)
C/C; patient states that she has neck back and shoulder pain.  Patient states that her pain level is an 8. The pain is from her neck all the way down to back. Left shoulder all the way down to finger tips.

## 2016-01-06 NOTE — Progress Notes (Signed)
Subjective:  Patient ID: Jill Anderson, female    DOB: [birth year]  Age: 50 y.o. MRN: [id number]  CC: Hypertension and Diabetes   HPI Jill Anderson has diabetes, cervical and lumbar DJD with radicular symptoms she presents for   1.. DM2: compliant with metformin. No low CBGs.   2. Elevated BP: reports elevated BP at home last week associated with HA and neck pain that is chronic. She does not have documented HTN. She has chronic neck and back pain.she has some anxiety.    Social History  Substance Use Topics  . Smoking status: Never Smoker  . Smokeless tobacco: Never Used  . Alcohol use No    Outpatient Medications Prior to Visit  Medication Sig Dispense Refill  . aspirin EC 325 MG tablet Take 1 tablet (325 mg total) by mouth daily. 30 tablet 11  . atorvastatin (LIPITOR) 40 MG tablet Take 1 tablet (40 mg total) by mouth daily. 90 tablet 3  . hyoscyamine (LEVSIN/SL) 0.125 MG SL tablet Place 1 tablet (0.125 mg total) under the tongue every 4 (four) hours as needed. (Patient not taking: Reported on 11/05/2015) 20 tablet 0  . ibuprofen (ADVIL,MOTRIN) 800 MG tablet Take 1 tablet (800 mg total) by mouth 3 (three) times daily. 21 tablet 0  . metFORMIN (GLUCOPHAGE) 500 MG tablet Take 1 tablet (500 mg total) by mouth 2 (two) times daily with a meal. 60 tablet 7  . Misc. Devices (CANE) MISC 1 each by Does not apply route as needed. 1 each 0  . naproxen (NAPROSYN) 500 MG tablet Take 1 tablet (500 mg total) by mouth 2 (two) times daily. 30 tablet 0  . traMADol (ULTRAM) 50 MG tablet Take 1 tablet (50 mg total) by mouth every 6 (six) hours as needed. (Patient taking differently: Take 50 mg by mouth every 6 (six) hours as needed for moderate pain. ) 60 tablet 2   No facility-administered medications prior to visit.     ROS Review of Systems  Constitutional: Positive for appetite change and fatigue. Negative for chills and fever.  Eyes: Positive for visual disturbance. Eye redness: blurry vision,  no vision loss   Respiratory: Negative for shortness of breath.   Cardiovascular: Negative for chest pain.  Gastrointestinal: Positive for nausea. Negative for abdominal pain, blood in stool and vomiting.  Musculoskeletal: Positive for back pain, gait problem and neck pain. Negative for arthralgias.  Skin: Negative for rash.  Allergic/Immunologic: Negative for immunocompromised state.  Neurological: Positive for dizziness, numbness and headaches.  Hematological: Negative for adenopathy. Does not bruise/bleed easily.  Psychiatric/Behavioral: Negative for dysphoric mood and suicidal ideas.    Objective:  BP 128/79 (BP Location: Left Arm, Patient Position: Sitting, Cuff Size: Small)   Pulse 83   Temp 98.3 F (36.8 C) (Oral)   Ht  (1.575 m)   Wt 175 lb 3.2 oz (79.5 kg)   SpO2 97%   BMI 32.04 kg/m   BP/Weight 01/06/2016 11/05/2015 10/29/2015  Systolic BP 128 115 134  Diastolic BP 79 73 60  Wt. (Lbs) 175.2 171 -  BMI 32.04 31.27 -    Physical Exam  Constitutional: She is oriented to person, place, and time. She appears well-developed and well-nourished. No distress.  HENT:  Head: Normocephalic and atraumatic.  Eyes: Conjunctivae and EOM are normal. Pupils are equal, round, and reactive to light.  Neck: Muscular tenderness present. Decreased range of motion present.    Cardiovascular: Normal rate, regular rhythm, normal heart sounds and intact distal  pulses.   Pulmonary/Chest: Effort normal and breath sounds normal.  Musculoskeletal: She exhibits no edema.       Left shoulder: She exhibits decreased range of motion, pain, spasm and decreased strength. She exhibits no tenderness and normal pulse.  Neurological: She is alert and oriented to person, place, and time. No cranial nerve deficit.  Skin: Skin is warm and dry. No rash noted.  Psychiatric: She has a normal mood and affect.    Lab Results  Component Value Date   HGBA1C 5.9 01/06/2016   CBG 136   Assessment & Plan:     There are no diagnoses linked to this encounter. Jill Anderson was seen today for hypertension and diabetes.  Diagnoses and all orders for this visit:  Type 2 diabetes mellitus with other specified complication (HCC) -     Glucose (CBG) -     HgB A1c  Chronic radicular low back pain -     diazepam (VALIUM) 5 MG tablet; Take 1 tablet (5 mg total) by mouth every 12 (twelve) hours as needed for anxiety. -     traMADol (ULTRAM) 50 MG tablet; Take 1 tablet (50 mg total) by mouth every 6 (six) hours as needed for moderate pain.  Chronic neck pain -     diazepam (VALIUM) 5 MG tablet; Take 1 tablet (5 mg total) by mouth every 12 (twelve) hours as needed for anxiety. -     traMADol (ULTRAM) 50 MG tablet; Take 1 tablet (50 mg total) by mouth every 6 (six) hours as needed for moderate pain.  Cervical disc disorder with radiculopathy of cervical region -     diazepam (VALIUM) 5 MG tablet; Take 1 tablet (5 mg total) by mouth every 12 (twelve) hours as needed for anxiety. -     traMADol (ULTRAM) 50 MG tablet; Take 1 tablet (50 mg total) by mouth every 6 (six) hours as needed for moderate pain.   No orders of the defined types were placed in this encounter.   Follow-up: Return in about 6 months (around 07/08/2016) for diabetes .   Jill PhiJosalyn Desha Bitner MD

## 2016-01-06 NOTE — Patient Instructions (Addendum)
[name] was seen today for hypertension and diabetes.  Diagnoses and all orders for this visit:  Type 2 diabetes mellitus with other specified complication (HCC) -     Glucose (CBG) -     HgB A1c  Chronic radicular low back pain -     diazepam (VALIUM) 5 MG tablet; Take 1 tablet (5 mg total) by mouth every 12 (twelve) hours as needed for anxiety. -     traMADol (ULTRAM) 50 MG tablet; Take 1 tablet (50 mg total) by mouth every 6 (six) hours as needed for moderate pain.  Chronic neck pain -     diazepam (VALIUM) 5 MG tablet; Take 1 tablet (5 mg total) by mouth every 12 (twelve) hours as needed for anxiety. -     traMADol (ULTRAM) 50 MG tablet; Take 1 tablet (50 mg total) by mouth every 6 (six) hours as needed for moderate pain.  Cervical disc disorder with radiculopathy of cervical region -     diazepam (VALIUM) 5 MG tablet; Take 1 tablet (5 mg total) by mouth every 12 (twelve) hours as needed for anxiety. -     traMADol (ULTRAM) 50 MG tablet; Take 1 tablet (50 mg total) by mouth every 6 (six) hours as needed for moderate pain.   Great job with diabetes! Add valium for chronic pain control Refilled tramadol.  F/u in 6-8 weeks for flu shot, we will have flu clinics  F/u in 6 months for diabetes or sooner if needed   Dr. Armen PickupFunches

## 2016-01-07 NOTE — Assessment & Plan Note (Signed)
Well-controlled.  Continue current regimen. 

## 2016-01-07 NOTE — Assessment & Plan Note (Signed)
Chronic pain Continue tramadol Add valium

## 2016-01-07 NOTE — Assessment & Plan Note (Signed)
Chronic pain Continue tramadol Add valium  

## 2016-03-20 ENCOUNTER — Encounter: Payer: Self-pay | Admitting: Family Medicine

## 2016-03-20 ENCOUNTER — Ambulatory Visit: Payer: Medicaid Other | Attending: Family Medicine | Admitting: Family Medicine

## 2016-03-20 VITALS — BP 142/85 | HR 63 | Temp 98.0°F | Ht 62.0 in | Wt 171.6 lb

## 2016-03-20 DIAGNOSIS — Z7982 Long term (current) use of aspirin: Secondary | ICD-10-CM | POA: Diagnosis not present

## 2016-03-20 DIAGNOSIS — E119 Type 2 diabetes mellitus without complications: Secondary | ICD-10-CM | POA: Diagnosis not present

## 2016-03-20 DIAGNOSIS — G8929 Other chronic pain: Secondary | ICD-10-CM | POA: Insufficient documentation

## 2016-03-20 DIAGNOSIS — M25562 Pain in left knee: Secondary | ICD-10-CM | POA: Insufficient documentation

## 2016-03-20 DIAGNOSIS — M5416 Radiculopathy, lumbar region: Secondary | ICD-10-CM

## 2016-03-20 DIAGNOSIS — M5010 Cervical disc disorder with radiculopathy, unspecified cervical region: Secondary | ICD-10-CM | POA: Insufficient documentation

## 2016-03-20 DIAGNOSIS — Z7984 Long term (current) use of oral hypoglycemic drugs: Secondary | ICD-10-CM | POA: Diagnosis not present

## 2016-03-20 DIAGNOSIS — M25561 Pain in right knee: Secondary | ICD-10-CM | POA: Insufficient documentation

## 2016-03-20 DIAGNOSIS — E1169 Type 2 diabetes mellitus with other specified complication: Secondary | ICD-10-CM

## 2016-03-20 DIAGNOSIS — M545 Low back pain: Secondary | ICD-10-CM | POA: Diagnosis not present

## 2016-03-20 DIAGNOSIS — Z79899 Other long term (current) drug therapy: Secondary | ICD-10-CM | POA: Insufficient documentation

## 2016-03-20 DIAGNOSIS — M542 Cervicalgia: Secondary | ICD-10-CM

## 2016-03-20 DIAGNOSIS — M549 Dorsalgia, unspecified: Secondary | ICD-10-CM | POA: Diagnosis present

## 2016-03-20 LAB — GLUCOSE, POCT (MANUAL RESULT ENTRY): POC Glucose: 108 mg/dl — AB (ref 70–99)

## 2016-03-20 MED ORDER — METHYLPREDNISOLONE 4 MG PO TBPK: ORAL_TABLET | ORAL | 0 refills | Status: DC

## 2016-03-20 MED ORDER — TRAMADOL HCL 50 MG PO TABS: 50.0000 mg | ORAL_TABLET | Freq: Four times a day (QID) | ORAL | 2 refills | Status: DC | PRN

## 2016-03-20 MED ORDER — TRUFORM ARM SLEEVE M 15-20MMHG MISC: 1.0000 | Freq: Every day | 0 refills | Status: DC

## 2016-03-20 MED ORDER — DIAZEPAM 5 MG PO TABS: 5.0000 mg | ORAL_TABLET | Freq: Two times a day (BID) | ORAL | 1 refills | Status: DC | PRN
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
## 2016-03-20 NOTE — Patient Instructions (Addendum)
[name] was seen today for back pain.  Diagnoses and all orders for this visit:  Type 2 diabetes mellitus with other specified complication, unspecified long term insulin use status (HCC) -     POCT glucose (manual entry) -     Microalbumin/Creatinine Ratio, Urine  Chronic radicular low back pain -     diazepam (VALIUM) 5 MG tablet; Take 1 tablet (5 mg total) by mouth every 12 (twelve) hours as needed for anxiety. -     traMADol (ULTRAM) 50 MG tablet; Take 1 tablet (50 mg total) by mouth every 6 (six) hours as needed for moderate pain. -     methylPREDNISolone (MEDROL DOSEPAK) 4 MG TBPK tablet; Taper per packet insert  Chronic neck pain -     diazepam (VALIUM) 5 MG tablet; Take 1 tablet (5 mg total) by mouth every 12 (twelve) hours as needed for anxiety. -     traMADol (ULTRAM) 50 MG tablet; Take 1 tablet (50 mg total) by mouth every 6 (six) hours as needed for moderate pain. -     methylPREDNISolone (MEDROL DOSEPAK) 4 MG TBPK tablet; Taper per packet insert -     Elastic Bandages & Supports (TRUFORM ARM SLEEVE M 15-20MMHG) MISC; 1 each by Does not apply route daily.  Cervical disc disorder with radiculopathy of cervical region -     diazepam (VALIUM) 5 MG tablet; Take 1 tablet (5 mg total) by mouth every 12 (twelve) hours as needed for anxiety. -     traMADol (ULTRAM) 50 MG tablet; Take 1 tablet (50 mg total) by mouth every 6 (six) hours as needed for moderate pain. -     methylPREDNISolone (MEDROL DOSEPAK) 4 MG TBPK tablet; Taper per packet insert -     Elastic Bandages & Supports (TRUFORM ARM SLEEVE M 15-20MMHG) MISC; 1 each by Does not apply route daily.   F/u in 6 weeks for chronic neck and back pain, knee pain   Dr. Armen PickupFunches

## 2016-03-20 NOTE — Progress Notes (Signed)
Pt is here for upper and lower back pain, pt also states that she is having knee pain.  Pt declined flu shot.

## 2016-03-20 NOTE — Assessment & Plan Note (Signed)
A: b/l anterior knee pain with evidence of acute inflammatory arthritis P: Offered steroid injections, patient declined Plan for steroid taper with depo medrol since she also have flare up of chronic neck and low back pain

## 2016-03-21 LAB — MICROALBUMIN / CREATININE URINE RATIO
Creatinine, Urine: 53 mg/dL (ref 20–320)
MICROALB UR: 0.2 mg/dL
Microalb Creat Ratio: 4 mcg/mg creat (ref <30)

## 2016-03-24 ENCOUNTER — Telehealth: Payer: Self-pay | Admitting: Family Medicine

## 2016-03-24 NOTE — Telephone Encounter (Signed)
[facility]isa [facility]Erica Miles [facility] called the office to speak with nurse regarding getting office notes that the health care manger is requesting for patient's visit on 8/7 and 10/20. Please fax notes to [phone]. Please follow up.  Thank you

## 2016-03-25 NOTE — Telephone Encounter (Signed)
Paperwork will be faxed over on 10/25. 

## 2016-06-15 ENCOUNTER — Other Ambulatory Visit: Payer: Self-pay | Admitting: *Deleted

## 2016-06-25 ENCOUNTER — Other Ambulatory Visit: Payer: Self-pay | Admitting: Pharmacist

## 2016-06-25 MED ORDER — IBUPROFEN 800 MG PO TABS: 800.0000 mg | ORAL_TABLET | Freq: Three times a day (TID) | ORAL | 0 refills | Status: DC | PRN

## 2016-07-31 ENCOUNTER — Ambulatory Visit: Payer: Medicaid Other | Attending: Family Medicine

## 2016-09-29 ENCOUNTER — Ambulatory Visit: Payer: Self-pay | Attending: Family Medicine | Admitting: Family Medicine

## 2016-09-29 ENCOUNTER — Encounter: Payer: Self-pay | Admitting: Family Medicine

## 2016-09-29 ENCOUNTER — Other Ambulatory Visit: Payer: Self-pay

## 2016-09-29 ENCOUNTER — Ambulatory Visit (HOSPITAL_COMMUNITY)
Admission: RE | Admit: 2016-09-29 | Discharge: 2016-09-29 | Disposition: A | Discharge status: Home / Self Care | Payer: Self-pay | Source: Ambulatory Visit | Attending: Family Medicine | Admitting: Family Medicine

## 2016-09-29 VITALS — BP 145/74 | HR 67 | Temp 98.1°F | Ht 62.0 in | Wt 167.6 lb

## 2016-09-29 DIAGNOSIS — Z1231 Encounter for screening mammogram for malignant neoplasm of breast: Secondary | ICD-10-CM

## 2016-09-29 DIAGNOSIS — Z7982 Long term (current) use of aspirin: Secondary | ICD-10-CM | POA: Insufficient documentation

## 2016-09-29 DIAGNOSIS — E1169 Type 2 diabetes mellitus with other specified complication: Secondary | ICD-10-CM

## 2016-09-29 DIAGNOSIS — Z90710 Acquired absence of both cervix and uterus: Secondary | ICD-10-CM | POA: Insufficient documentation

## 2016-09-29 DIAGNOSIS — R079 Chest pain, unspecified: Secondary | ICD-10-CM | POA: Insufficient documentation

## 2016-09-29 DIAGNOSIS — M5416 Radiculopathy, lumbar region: Secondary | ICD-10-CM

## 2016-09-29 DIAGNOSIS — E138 Other specified diabetes mellitus with unspecified complications: Secondary | ICD-10-CM | POA: Insufficient documentation

## 2016-09-29 DIAGNOSIS — M542 Cervicalgia: Secondary | ICD-10-CM

## 2016-09-29 DIAGNOSIS — Z8249 Family history of ischemic heart disease and other diseases of the circulatory system: Secondary | ICD-10-CM | POA: Insufficient documentation

## 2016-09-29 DIAGNOSIS — G8929 Other chronic pain: Secondary | ICD-10-CM

## 2016-09-29 DIAGNOSIS — M5010 Cervical disc disorder with radiculopathy, unspecified cervical region: Secondary | ICD-10-CM

## 2016-09-29 DIAGNOSIS — M545 Low back pain: Secondary | ICD-10-CM | POA: Insufficient documentation

## 2016-09-29 DIAGNOSIS — E119 Type 2 diabetes mellitus without complications: Secondary | ICD-10-CM | POA: Insufficient documentation

## 2016-09-29 DIAGNOSIS — Z7984 Long term (current) use of oral hypoglycemic drugs: Secondary | ICD-10-CM | POA: Insufficient documentation

## 2016-09-29 DIAGNOSIS — M4722 Other spondylosis with radiculopathy, cervical region: Secondary | ICD-10-CM | POA: Insufficient documentation

## 2016-09-29 LAB — POCT GLYCOSYLATED HEMOGLOBIN (HGB A1C): Hemoglobin A1C: 6

## 2016-09-29 LAB — GLUCOSE, POCT (MANUAL RESULT ENTRY): POC Glucose: 81 mg/dl (ref 70–99)

## 2016-09-29 MED ORDER — DIAZEPAM 5 MG PO TABS: 5.0000 mg | ORAL_TABLET | Freq: Two times a day (BID) | ORAL | 1 refills | Status: DC | PRN

## 2016-09-29 MED ORDER — METHOCARBAMOL 500 MG PO TABS: 500.0000 mg | ORAL_TABLET | Freq: Three times a day (TID) | ORAL | 0 refills | Status: DC | PRN

## 2016-09-29 MED ORDER — METFORMIN HCL 500 MG PO TABS: 500.0000 mg | ORAL_TABLET | Freq: Two times a day (BID) | ORAL | 7 refills | Status: DC

## 2016-09-29 MED ORDER — ATORVASTATIN CALCIUM 40 MG PO TABS: 40.0000 mg | ORAL_TABLET | Freq: Every day | ORAL | 3 refills | Status: DC

## 2016-09-29 MED ORDER — ASPIRIN EC 81 MG PO TBEC: 81.0000 mg | DELAYED_RELEASE_TABLET | Freq: Every day | ORAL | 11 refills | Status: AC
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
## 2016-09-29 NOTE — Assessment & Plan Note (Signed)
New onset chest pain in 51 yo F with well controlled diabetes, family history of CAD Exam consistent with [facility] pain EKG normal  Plan: Flexeril for 1 week course Continue valium and tramadol Reassurance  Given her diabetes and family history daily aspirin and statin recommended

## 2016-09-29 NOTE — Patient Instructions (Addendum)
Jill Anderson was seen today for pain.  Diagnoses and all orders for this visit:  Type 2 diabetes mellitus with other specified complication, unspecified whether long term insulin use (HCC) -     POCT glucose (manual entry) -     POCT glycosylated hemoglobin (Hb A1C) -     atorvastatin (LIPITOR) 40 MG tablet; Take 1 tablet (40 mg total) by mouth daily. -     Lipid Panel  Visit for screening mammogram -     MM DIGITAL SCREENING BILATERAL; Future  Chest pain, unspecified type -     EKG 12-Lead -     CBC -     D-dimer, quantitative (not at [facility])   Please call Stoney Bang, [phone],  with the BCCCP (breast and cervical cancer control program) at the [facility] to set up an appointment to verify eligibility for a breast exam, mammogram, ultrasound. If you qualify this will be set up at [facility].  Continue daily aspirin 81 mg daily is enough Start daily lipitor 40 mg daily These two medications prevent heart attacks and stroke   f/u in 3 weeks for chest pain   Dr. Armen Pickup

## 2016-09-30 LAB — CBC
HEMATOCRIT: 42.4 % (ref 34.0–46.6)
HEMOGLOBIN: 14.2 g/dL (ref 11.1–15.9)
MCH: 30.1 pg (ref 26.6–33.0)
MCHC: 33.5 g/dL (ref 31.5–35.7)
MCV: 90 fL (ref 79–97)
Platelets: 326 10*3/uL (ref 150–379)
RBC: 4.72 x10E6/uL (ref 3.77–5.28)
RDW: 12.9 % (ref 12.3–15.4)
WBC: 7.3 10*3/uL (ref 3.4–10.8)

## 2016-09-30 LAB — LIPID PANEL
CHOL/HDL RATIO: 4.8 ratio — AB (ref 0.0–4.4)
Cholesterol, Total: 240 mg/dL — ABNORMAL HIGH (ref 100–199)
HDL: 50 mg/dL (ref >39)
LDL Calculated: 152 mg/dL — ABNORMAL HIGH (ref 0–99)
Triglycerides: 188 mg/dL — ABNORMAL HIGH (ref 0–149)
VLDL Cholesterol Cal: 38 mg/dL (ref 5–40)

## 2016-09-30 LAB — D-DIMER, QUANTITATIVE (NOT AT ARMC): D-DIMER: 0.35 mg{FEU}/L (ref 0.00–0.49)

## 2016-10-02 ENCOUNTER — Telehealth: Payer: Self-pay

## 2016-10-02 NOTE — Telephone Encounter (Signed)
Pt was called and informed of lab results and to restart medication.

## 2016-10-14 ENCOUNTER — Encounter: Payer: Self-pay | Admitting: Family Medicine

## 2016-10-27 ENCOUNTER — Ambulatory Visit: Payer: Self-pay | Attending: Family Medicine | Admitting: Family Medicine

## 2016-10-27 ENCOUNTER — Encounter: Payer: Self-pay | Admitting: Family Medicine

## 2016-10-27 VITALS — BP 146/86 | HR 65 | Temp 97.8°F | Wt 170.8 lb

## 2016-10-27 DIAGNOSIS — Z7982 Long term (current) use of aspirin: Secondary | ICD-10-CM | POA: Insufficient documentation

## 2016-10-27 DIAGNOSIS — I10 Essential (primary) hypertension: Secondary | ICD-10-CM | POA: Insufficient documentation

## 2016-10-27 DIAGNOSIS — Z8249 Family history of ischemic heart disease and other diseases of the circulatory system: Secondary | ICD-10-CM | POA: Insufficient documentation

## 2016-10-27 DIAGNOSIS — M549 Dorsalgia, unspecified: Secondary | ICD-10-CM | POA: Insufficient documentation

## 2016-10-27 DIAGNOSIS — E1169 Type 2 diabetes mellitus with other specified complication: Secondary | ICD-10-CM

## 2016-10-27 DIAGNOSIS — Z7984 Long term (current) use of oral hypoglycemic drugs: Secondary | ICD-10-CM | POA: Insufficient documentation

## 2016-10-27 DIAGNOSIS — E785 Hyperlipidemia, unspecified: Secondary | ICD-10-CM | POA: Insufficient documentation

## 2016-10-27 DIAGNOSIS — L918 Other hypertrophic disorders of the skin: Secondary | ICD-10-CM | POA: Insufficient documentation

## 2016-10-27 DIAGNOSIS — M542 Cervicalgia: Secondary | ICD-10-CM | POA: Insufficient documentation

## 2016-10-27 DIAGNOSIS — Z79899 Other long term (current) drug therapy: Secondary | ICD-10-CM | POA: Insufficient documentation

## 2016-10-27 DIAGNOSIS — E119 Type 2 diabetes mellitus without complications: Secondary | ICD-10-CM | POA: Insufficient documentation

## 2016-10-27 DIAGNOSIS — G8929 Other chronic pain: Secondary | ICD-10-CM | POA: Insufficient documentation

## 2016-10-27 LAB — GLUCOSE, POCT (MANUAL RESULT ENTRY): POC GLUCOSE: 85 mg/dL (ref 70–99)

## 2016-10-27 MED ORDER — PRAVASTATIN SODIUM 40 MG PO TABS: 40.0000 mg | ORAL_TABLET | Freq: Every day | ORAL | 3 refills | Status: DC
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
## 2016-10-27 NOTE — Patient Instructions (Addendum)
Jill Anderson was seen today for follow-up.  Diagnoses and all orders for this visit:  Type 2 diabetes mellitus with other specified complication, unspecified whether long term insulin use (HCC) -     POCT glucose (manual entry) -     pravastatin (PRAVACHOL) 40 MG tablet; Take 1 tablet (40 mg total) by mouth daily.  Hypertension, unspecified type  Skin tag   Reduce salt to reduce blood pressure goal is < 130/80 Replace lipitor with pravastatn  Keep skin tag removal sites covered and dry for 24 hrs   F/u in 6 weeks for BP check   Dr. Armen PickupFunches

## 2016-10-29 NOTE — Assessment & Plan Note (Signed)
Intolerant of Lipitor Transition to pravastatin

## 2016-10-29 NOTE — Assessment & Plan Note (Signed)
A: elevated BP Med: none P: Patient to decrease sodium intake Recommended ace inhibitor or ARB if BP remains elevated

## 2016-10-29 NOTE — Assessment & Plan Note (Signed)
Well controlled. Continue metformin.

## 2016-12-11 ENCOUNTER — Ambulatory Visit: Payer: Self-pay | Admitting: Family Medicine

## 2016-12-14 ENCOUNTER — Ambulatory Visit: Payer: Medicare Other | Attending: Family Medicine | Admitting: Family Medicine

## 2016-12-14 ENCOUNTER — Encounter: Payer: Self-pay | Admitting: Family Medicine

## 2016-12-14 VITALS — BP 119/77 | HR 78 | Temp 98.6°F | Resp 18 | Ht 62.0 in | Wt 154.0 lb

## 2016-12-14 DIAGNOSIS — Z7984 Long term (current) use of oral hypoglycemic drugs: Secondary | ICD-10-CM | POA: Diagnosis not present

## 2016-12-14 DIAGNOSIS — Z7982 Long term (current) use of aspirin: Secondary | ICD-10-CM | POA: Diagnosis not present

## 2016-12-14 DIAGNOSIS — M542 Cervicalgia: Secondary | ICD-10-CM | POA: Insufficient documentation

## 2016-12-14 DIAGNOSIS — Z9889 Other specified postprocedural states: Secondary | ICD-10-CM | POA: Insufficient documentation

## 2016-12-14 DIAGNOSIS — E119 Type 2 diabetes mellitus without complications: Secondary | ICD-10-CM | POA: Insufficient documentation

## 2016-12-14 DIAGNOSIS — Z90710 Acquired absence of both cervix and uterus: Secondary | ICD-10-CM | POA: Insufficient documentation

## 2016-12-14 DIAGNOSIS — M79642 Pain in left hand: Secondary | ICD-10-CM

## 2016-12-14 DIAGNOSIS — Z79891 Long term (current) use of opiate analgesic: Secondary | ICD-10-CM | POA: Insufficient documentation

## 2016-12-14 DIAGNOSIS — L509 Urticaria, unspecified: Secondary | ICD-10-CM | POA: Insufficient documentation

## 2016-12-14 DIAGNOSIS — G8929 Other chronic pain: Secondary | ICD-10-CM | POA: Diagnosis not present

## 2016-12-14 DIAGNOSIS — L500 Allergic urticaria: Secondary | ICD-10-CM | POA: Insufficient documentation

## 2016-12-14 DIAGNOSIS — M79641 Pain in right hand: Secondary | ICD-10-CM | POA: Diagnosis not present

## 2016-12-14 DIAGNOSIS — E1169 Type 2 diabetes mellitus with other specified complication: Secondary | ICD-10-CM

## 2016-12-14 LAB — GLUCOSE, POCT (MANUAL RESULT ENTRY): POC Glucose: 86 mg/dl (ref 70–99)

## 2016-12-14 MED ORDER — PREDNISONE 10 MG PO TABS: ORAL_TABLET | ORAL | 0 refills | Status: DC

## 2016-12-14 MED FILL — ?PREDNISONE 10 MG TABLET: 10 | 9 days supply | Qty: 21 | Fill #0

## 2016-12-14 NOTE — Patient Instructions (Addendum)
Jill Anderson was seen today for allergic reaction.  Diagnoses and all orders for this visit:  Type 2 diabetes mellitus with other specified complication, unspecified whether long term insulin use (HCC) -     Glucose (CBG)  Urticaria -     CMP14+EGFR -     CBC -     predniSONE (DELTASONE) 10 MG tablet; Take by mouth with food 4 tablets for 2 days, 3 tablets for 2 days, 2 tablets for 2 days, 1 tablet for 3 days then STOP  Pain in both hands -     DG Hand 2 View Left; Future -     DG Hand 2 View Right; Future   F/u in 6 weeks sooner if needed   Dr. Adrian Blackwater   Hives Hives (urticaria) are itchy, red, swollen areas on your skin. Hives can appear on any part of your body and can vary in size. They can be as small as the tip of a pen or much larger. Hives often fade within 24 hours (acute hives). In other cases, new hives appear after old ones fade. This cycle can continue for several days or weeks (chronic hives). Hives result from your body's reaction to an irritant or to something that you are allergic to (trigger). When you are exposed to a trigger, your body releases a chemical (histamine) that causes redness, itching, and swelling. You can get hives immediately after being exposed to a trigger or hours later. Hives do not spread from person to person (are not contagious). Your hives may get worse with scratching, exercise, and emotional stress. What are the causes? Causes of this condition include:  Allergies to certain foods or ingredients.  Insect bites or stings.  Exposure to pollen or pet dander.  Contact with latex or chemicals.  Spending time in sunlight, heat, or cold (exposure).  Exercise.  Stress.  You can also get hives from some medical conditions and treatments. These include:  Viruses, including the common cold.  Bacterial infections, such as urinary tract infections and strep throat.  Disorders such as vasculitis, lupus, or thyroid disease.  Certain  medications.  Allergy shots.  Blood transfusions.  Sometimes, the cause of hives is not known (idiopathic hives). What increases the risk? This condition is more likely to develop in:  Women.  People who have food allergies, especially to citrus fruits, milk, eggs, peanuts, tree nuts, or shellfish.  People who are allergic to: ? Medicines. ? Latex. ? Insects. ? Animals. ? Pollen.  People who have certain medical conditions, includinglupus or thyroid disease.  What are the signs or symptoms? The main symptom of this condition is raised, itchyred or white bumps or patches on your skin. These areas may:  Become large and swollen (welts).  Change in shape and location, quickly and repeatedly.  Be separate hives or connect over a large area of skin.  Sting or become painful.  Turn white when pressed in the center (blanch).  In severe cases, yourhands, feet, and face may also become swollen. This may occur if hives develop deeper in your skin. How is this diagnosed? This condition is diagnosed based on your symptoms, medical history, and physical exam. Your skin, urine, or blood may be tested to find out what is causing your hives and to rule out other health issues. Your health care provider may also remove a small sample of skin from the affected area and examine it under a microscope (biopsy). How is this treated? Treatment depends on the severity  of your condition. Your health care provider may recommend using cool, wet cloths (cool compresses) or taking cool showers to relieve itching. Hives are sometimes treated with medicines, including:  Antihistamines.  Corticosteroids.  Antibiotics.  An injectable medicine (omalizumab). Your health care provider may prescribe this if you have chronic idiopathic hives and you continue to have symptoms even after treatment with antihistamines.  Severe cases may require an emergency injection of adrenaline (epinephrine) to  prevent a life-threatening allergic reaction (anaphylaxis). Follow these instructions at home: Medicines  Take or apply over-the-counter and prescription medicines only as told by your health care provider.  If you were prescribed an antibiotic medicine, use it as told by your health care provider. Do not stop taking the antibiotic even if you start to feel better. Skin Care  Apply cool compresses to the affected areas.  Do not scratch or rub your skin. General instructions  Do not take hot showers or baths. This can make itching worse.  Do not wear tight-fitting clothing.  Use sunscreen and wear protective clothing when you are outside.  Avoid any substances that cause your hives. Keep a journal to help you track what causes your hives. Write down: ? What medicines you take. ? What you eat and drink. ? What products you use on your skin.  Keep all follow-up visits as told by your health care provider. This is important. Contact a health care provider if:  Your symptoms are not controlled with medicine.  Your joints are painful or swollen. Get help right away if:  You have a fever.  You have pain in your abdomen.  Your tongue or lips are swollen.  Your eyelids are swollen.  Your chest or throat feels tight.  You have trouble breathing or swallowing. These symptoms may represent a serious problem that is an emergency. Do not wait to see if the symptoms will go away. Get medical help right away. Call your local emergency services (911 in the [place] Do not drive yourself to the hospital. This information is not intended to replace advice given to you by your health care provider. Make sure you discuss any questions you have with your health care provider. Document Released: 05/18/2005 Document Revised: 10/16/2015 Document Reviewed: 03/06/2015 Elsevier Interactive Patient Education  2017 [employer].

## 2016-12-14 NOTE — Progress Notes (Signed)
Patient ID: Jill Anderson, female   DOB: 1965-09-19, 51 y.o.   MRN: [id number]   Subjective:  Patient ID: Jill Anderson, female    DOB: 06-07-1965  Age: 51 y.o. MRN: [id number]  CC: Allergic Reaction   HPI Jill Anderson has well controlled diabetes, chronic pain in neck and back she   presents for   1. Hives: x  1 month. Off and on. Severely itchy. Occur outside and at home. All over body. No new foods or skin products. Improved with topical benadryl and alcohol.   2. Hand pain: pain in fingers bot both hands. Popping of joints. No joint swelling. She has degenerative changes in her cervical and lumbar spine.    Past Surgical History:  Procedure Laterality Date  . ABDOMINAL HYSTERECTOMY    . BACK SURGERY    . CYSTOSCOPY WITH RETROGRADE PYELOGRAM, URETEROSCOPY AND STENT PLACEMENT Left 10/28/2015   Procedure: CYSTOSCOPY WITH RETROGRADE PYELOGRAM, URETEROSCOPY AND STENT PLACEMENT, AND LASER;  Surgeon: Irine Seal, MD;  Location: [facility];  Service: Urology;  Laterality: Left;   Social History  Substance Use Topics  . Smoking status: Never Smoker  . Smokeless tobacco: Never Used  . Alcohol use No    Outpatient Medications Prior to Visit  Medication Sig Dispense Refill  . aspirin EC 81 MG tablet Take 1 tablet (81 mg total) by mouth daily. 30 tablet 11  . diazepam (VALIUM) 5 MG tablet Take 1 tablet (5 mg total) by mouth every 12 (twelve) hours as needed for anxiety. 60 tablet 1  . Elastic Bandages & Supports (TRUFORM ARM 52 M 15-20MMHG) MISC 1 each by Does not apply route daily. 1 each 0  . ibuprofen (ADVIL,MOTRIN) 800 MG tablet Take 1 tablet (800 mg total) by mouth every 8 (eight) hours as needed. 30 tablet 0  . metFORMIN (GLUCOPHAGE) 500 MG tablet Take 1 tablet (500 mg total) by mouth 2 (two) times daily with a meal. 60 tablet 7  . methocarbamol (ROBAXIN) 500 MG tablet Take 1 tablet (500 mg total) by mouth every 8 (eight) hours as needed for muscle spasms. 21 tablet 0  . Misc. Devices  (CANE) MISC 1 each by Does not apply route as needed. 1 each 0  . pravastatin (PRAVACHOL) 40 MG tablet Take 1 tablet (40 mg total) by mouth daily. 90 tablet 3  . psyllium (METAMUCIL) 58.6 % powder Take 1 packet by mouth 2 (two) times daily.    . traMADol (ULTRAM) 50 MG tablet Take 1 tablet (50 mg total) by mouth every 6 (six) hours as needed for moderate pain. 60 tablet 2   No facility-administered medications prior to visit.     ROS Review of Systems  Constitutional: Negative for appetite change, chills, fatigue and fever.  Eyes: Negative for redness and visual disturbance.  Respiratory: Negative for shortness of breath.   Cardiovascular: Negative for chest pain.  Gastrointestinal: Negative for abdominal pain, blood in stool, nausea and vomiting.  Musculoskeletal: Positive for arthralgias, back pain, gait problem and neck pain. Negative for joint swelling.  Skin: Positive for color change. Negative for rash.  Allergic/Immunologic: Negative for immunocompromised state.  Neurological: Positive for numbness (in hands L>R). Negative for dizziness.  Hematological: Negative for adenopathy. Does not bruise/bleed easily.  Psychiatric/Behavioral: Negative for dysphoric mood and suicidal ideas.   Objective:  BP 119/77 (BP Location: Right Arm, Patient Position: Sitting, Cuff Size: Normal)   Pulse 78   Temp 98.6 F (37 C) (Oral)   Resp 18  Ht 5' 2"  (1.575 m)   Wt 154 lb (69.9 kg)   SpO2 98%   BMI 28.17 kg/m   BP/Weight 12/14/2016 06/02/5850 12/05/8240  Systolic BP 353 614 431  Diastolic BP 77 86 74  Wt. (Lbs) 154 170.8 167.6  BMI 28.17 31.24 30.65   Physical Exam  Constitutional: She is oriented to person, place, and time. She appears well-developed and well-nourished. No distress.  HENT:  Head: Normocephalic and atraumatic.  Eyes: Pupils are equal, round, and reactive to light. Conjunctivae and EOM are normal.  Neck: Muscular tenderness present. Decreased range of motion present.     Cardiovascular: Normal rate, regular rhythm, normal heart sounds and intact distal pulses.   Pulmonary/Chest: Effort normal. No respiratory distress. She has wheezes in the left upper field. She has no rales. Chest wall is not dull to percussion. She exhibits no mass, no bony tenderness, no laceration, no crepitus, no edema, no deformity, no swelling and no retraction. Right breast exhibits no inverted nipple, no mass, no nipple discharge, no skin change and no tenderness. Left breast exhibits no inverted nipple, no mass, no nipple discharge and no skin change. Breasts are symmetrical.  Musculoskeletal: She exhibits no edema.       Left shoulder: She exhibits decreased range of motion, pain, spasm and decreased strength. She exhibits no tenderness and normal pulse.       Right knee: She exhibits normal range of motion and no swelling. Tenderness found. Patellar tendon tenderness noted.       Left knee: She exhibits normal range of motion and no swelling. Tenderness found. Patellar tendon tenderness noted.  Neurological: She is alert and oriented to person, place, and time. No cranial nerve deficit.  Skin: Skin is warm and dry. No rash noted.     Psychiatric: She has a normal mood and affect.    Lab Results  Component Value Date   HGBA1C 6.0 09/29/2016   CBG 85   Assessment & Plan:   Problem List Items Addressed This Visit      High   DM2 (diabetes mellitus, type 2) (Advance) - Primary (Chronic)   Relevant Orders   Glucose (CBG) (Completed)    Other Visit Diagnoses    Urticaria       Relevant Medications   predniSONE (DELTASONE) 10 MG tablet   Other Relevant Orders   CMP14+EGFR   CBC   Pain in both hands       Relevant Orders   DG Hand 2 View Left   DG Hand 2 View Right      No orders of the defined types were placed in this encounter.   Follow-up: Return in about 6 weeks (around 01/25/2017) for hives.   Boykin Nearing MD

## 2016-12-14 NOTE — Assessment & Plan Note (Signed)
A: pain in both hands with joint stiffness P: X-rays NSAID, heat Offered PT, she is uninsured and declines Advised her prednisone  course will help

## 2016-12-14 NOTE — Assessment & Plan Note (Signed)
A: urticaria over past month also with wheezing on lung exam. No known triggers.  P: Prednisone taper CBC and CMP

## 2016-12-15 ENCOUNTER — Ambulatory Visit (HOSPITAL_COMMUNITY)
Admission: RE | Admit: 2016-12-15 | Discharge: 2016-12-15 | Disposition: A | Discharge status: Home / Self Care | Payer: Medicare Other | Source: Ambulatory Visit | Attending: Family Medicine | Admitting: Family Medicine

## 2016-12-15 DIAGNOSIS — M79642 Pain in left hand: Secondary | ICD-10-CM | POA: Diagnosis not present

## 2016-12-15 DIAGNOSIS — M79641 Pain in right hand: Secondary | ICD-10-CM

## 2016-12-15 LAB — CMP14+EGFR
ALBUMIN: 4.8 g/dL (ref 3.5–5.5)
ALK PHOS: 56 IU/L (ref 39–117)
ALT: 22 IU/L (ref 0–32)
AST: 23 IU/L (ref 0–40)
Albumin/Globulin Ratio: 1.8 (ref 1.2–2.2)
BILIRUBIN TOTAL: 1.2 mg/dL (ref 0.0–1.2)
BUN / CREAT RATIO: 13 (ref 9–23)
BUN: 10 mg/dL (ref 6–24)
CHLORIDE: 100 mmol/L (ref 96–106)
CO2: 20 mmol/L (ref 20–29)
Calcium: 9.8 mg/dL (ref 8.7–10.2)
Creatinine, Ser: 0.75 mg/dL (ref 0.57–1.00)
GFR calc Af Amer: 106 mL/min/{1.73_m2} (ref >59)
GFR calc non Af Amer: 92 mL/min/{1.73_m2} (ref >59)
Globulin, Total: 2.7 g/dL (ref 1.5–4.5)
Glucose: 84 mg/dL (ref 65–99)
Potassium: 4.5 mmol/L (ref 3.5–5.2)
SODIUM: 139 mmol/L (ref 134–144)
Total Protein: 7.5 g/dL (ref 6.0–8.5)

## 2016-12-15 LAB — CBC
Hematocrit: 43.7 % (ref 34.0–46.6)
Hemoglobin: 14.7 g/dL (ref 11.1–15.9)
MCH: 30.1 pg (ref 26.6–33.0)
MCHC: 33.6 g/dL (ref 31.5–35.7)
MCV: 90 fL (ref 79–97)
PLATELETS: 324 10*3/uL (ref 150–379)
RBC: 4.88 x10E6/uL (ref 3.77–5.28)
RDW: 13.3 % (ref 12.3–15.4)
WBC: 7 10*3/uL (ref 3.4–10.8)

## 2017-01-04 ENCOUNTER — Encounter: Payer: Self-pay | Admitting: Family Medicine

## 2017-01-04 ENCOUNTER — Ambulatory Visit: Payer: Medicare Other | Attending: Family Medicine | Admitting: Family Medicine

## 2017-01-04 VITALS — BP 117/74 | HR 70 | Temp 97.7°F | Ht 62.0 in | Wt 168.6 lb

## 2017-01-04 DIAGNOSIS — G8929 Other chronic pain: Secondary | ICD-10-CM

## 2017-01-04 DIAGNOSIS — M5412 Radiculopathy, cervical region: Secondary | ICD-10-CM | POA: Diagnosis not present

## 2017-01-04 DIAGNOSIS — L509 Urticaria, unspecified: Secondary | ICD-10-CM

## 2017-01-04 DIAGNOSIS — Z79899 Other long term (current) drug therapy: Secondary | ICD-10-CM | POA: Diagnosis not present

## 2017-01-04 DIAGNOSIS — Z9889 Other specified postprocedural states: Secondary | ICD-10-CM | POA: Diagnosis not present

## 2017-01-04 DIAGNOSIS — E1169 Type 2 diabetes mellitus with other specified complication: Secondary | ICD-10-CM

## 2017-01-04 DIAGNOSIS — Z7984 Long term (current) use of oral hypoglycemic drugs: Secondary | ICD-10-CM | POA: Diagnosis not present

## 2017-01-04 DIAGNOSIS — E119 Type 2 diabetes mellitus without complications: Secondary | ICD-10-CM | POA: Diagnosis not present

## 2017-01-04 DIAGNOSIS — Z7982 Long term (current) use of aspirin: Secondary | ICD-10-CM | POA: Diagnosis not present

## 2017-01-04 DIAGNOSIS — R200 Anesthesia of skin: Secondary | ICD-10-CM | POA: Diagnosis not present

## 2017-01-04 DIAGNOSIS — M79641 Pain in right hand: Secondary | ICD-10-CM | POA: Insufficient documentation

## 2017-01-04 DIAGNOSIS — M5010 Cervical disc disorder with radiculopathy, unspecified cervical region: Secondary | ICD-10-CM

## 2017-01-04 DIAGNOSIS — M79642 Pain in left hand: Secondary | ICD-10-CM | POA: Insufficient documentation

## 2017-01-04 DIAGNOSIS — M5416 Radiculopathy, lumbar region: Secondary | ICD-10-CM

## 2017-01-04 DIAGNOSIS — Z90710 Acquired absence of both cervix and uterus: Secondary | ICD-10-CM | POA: Insufficient documentation

## 2017-01-04 DIAGNOSIS — M542 Cervicalgia: Secondary | ICD-10-CM

## 2017-01-04 DIAGNOSIS — N951 Menopausal and female climacteric states: Secondary | ICD-10-CM

## 2017-01-04 LAB — POCT GLYCOSYLATED HEMOGLOBIN (HGB A1C): HEMOGLOBIN A1C: 6

## 2017-01-04 LAB — GLUCOSE, POCT (MANUAL RESULT ENTRY): POC Glucose: 143 mg/dl — AB (ref 70–99)

## 2017-01-04 MED ORDER — DIAZEPAM 5 MG PO TABS: 5.0000 mg | ORAL_TABLET | Freq: Two times a day (BID) | ORAL | 2 refills | Status: DC | PRN

## 2017-01-04 MED ORDER — METHOCARBAMOL 500 MG PO TABS: 500.0000 mg | ORAL_TABLET | Freq: Three times a day (TID) | ORAL | 0 refills | Status: DC | PRN

## 2017-01-04 MED ORDER — TRAMADOL HCL 50 MG PO TABS: 50.0000 mg | ORAL_TABLET | Freq: Four times a day (QID) | ORAL | 2 refills | Status: DC | PRN

## 2017-01-04 MED ORDER — IBUPROFEN 800 MG PO TABS
800.0000 mg | ORAL_TABLET | Freq: Three times a day (TID) | ORAL | 2 refills | Status: DC | PRN
Start: 2017-01-04 — End: 2017-05-14
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
## 2017-01-04 NOTE — Patient Instructions (Addendum)
[name] was seen today for diabetes.  Diagnoses and all orders for this visit:  Type 2 diabetes mellitus with other specified complication, unspecified whether long term insulin use (HCC) -     POCT glucose (manual entry) -     POCT glycosylated hemoglobin (Hb A1C)  Urticaria -     Ambulatory referral to Allergy  Chronic radicular low back pain -     traMADol (ULTRAM) 50 MG tablet; Take 1 tablet (50 mg total) by mouth every 6 (six) hours as needed for moderate pain. -     ibuprofen (ADVIL,MOTRIN) 800 MG tablet; Take 1 tablet (800 mg total) by mouth every 8 (eight) hours as needed. -     diazepam (VALIUM) 5 MG tablet; Take 1 tablet (5 mg total) by mouth every 12 (twelve) hours as needed for anxiety.  Chronic neck pain -     traMADol (ULTRAM) 50 MG tablet; Take 1 tablet (50 mg total) by mouth every 6 (six) hours as needed for moderate pain. -     ibuprofen (ADVIL,MOTRIN) 800 MG tablet; Take 1 tablet (800 mg total) by mouth every 8 (eight) hours as needed. -     diazepam (VALIUM) 5 MG tablet; Take 1 tablet (5 mg total) by mouth every 12 (twelve) hours as needed for anxiety.  Cervical disc disorder with radiculopathy of cervical region -     traMADol (ULTRAM) 50 MG tablet; Take 1 tablet (50 mg total) by mouth every 6 (six) hours as needed for moderate pain. -     ibuprofen (ADVIL,MOTRIN) 800 MG tablet; Take 1 tablet (800 mg total) by mouth every 8 (eight) hours as needed. -     diazepam (VALIUM) 5 MG tablet; Take 1 tablet (5 mg total) by mouth every 12 (twelve) hours as needed for anxiety. -     methocarbamol (ROBAXIN) 500 MG tablet; Take 1 tablet (500 mg total) by mouth every 8 (eight) hours as needed for muscle spasms.  Menopausal symptoms -     Follicle Stimulating Hormone -     TSH   You will be called with lab results. Estrogen hormone replacement therapy is an option. This is most often done to prevent early menopause. Menopause at 51 is normal and does not routinely need to be  treatment.   F/u in 6 weeks for flu shot and f/u menopausal symptoms   Dr. Armen PickupFunches

## 2017-01-04 NOTE — Assessment & Plan Note (Signed)
She continues to have well controlled, non-insulin dependent diabetes on metformin 500 mg BID

## 2017-01-04 NOTE — Assessment & Plan Note (Signed)
Persistent urticaria without angioedema Unknown trigger Normal CBC and CMP   Plan: Referral to allergist for patch testing

## 2017-01-04 NOTE — Assessment & Plan Note (Signed)
Menopausal symptoms (irritable mood and hot flashes)  in menopausal age female s/p hysterectomy. She would like to possible restart estrogen replacement therapy.  Plan: TSH and FSH check

## 2017-01-05 LAB — FOLLICLE STIMULATING HORMONE: FSH: 38.4 m[IU]/mL

## 2017-01-05 LAB — TSH: TSH: 2.42 u[IU]/mL (ref 0.450–4.500)

## 2017-01-07 ENCOUNTER — Telehealth: Payer: Self-pay

## 2017-01-07 NOTE — Telephone Encounter (Signed)
Pt was called and informed of lab results. 

## 2017-01-15 MED FILL — IBUPROFEN 800 MG TABLET: 800 | 20 days supply | Qty: 60 | Fill #0

## 2017-01-15 MED FILL — METHOCARBAMOL 500 MG TABLET: 500 | 10 days supply | Qty: 30 | Fill #0

## 2017-01-26 ENCOUNTER — Other Ambulatory Visit: Payer: Self-pay | Admitting: Internal Medicine

## 2017-01-26 DIAGNOSIS — Z1231 Encounter for screening mammogram for malignant neoplasm of breast: Secondary | ICD-10-CM

## 2017-01-30 IMAGING — MR MR MRA HEAD W/O CM
1 series · 20 of 48 positions shown · non-contrast
Comparison: None.

CLINICAL DATA: New onset left temple Headache with abnormal
sensation in the left side of the face. Symptoms for 1 week.

EXAM:
MRA HEAD WITHOUT CONTRAST
TECHNIQUE: Angiographic images of the Circle of Willis were obtained using MRA
technique without intravenous contrast.

[Series 3: (id) mt fs · axial · 1.4mm · 0.39mm/px · z∈[-102,+26]mm · 20 of 191 slices shown]
[im 1/191]
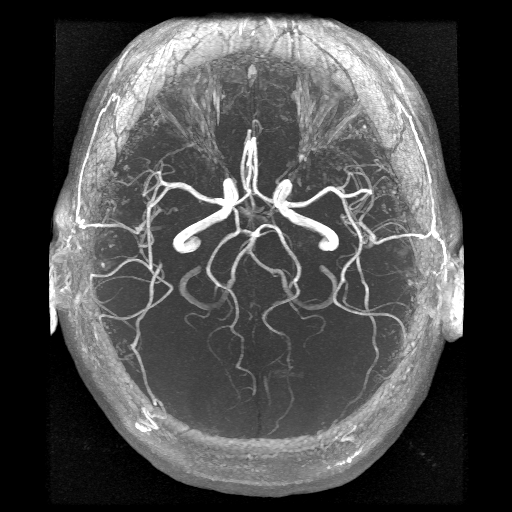
[im 5/191]
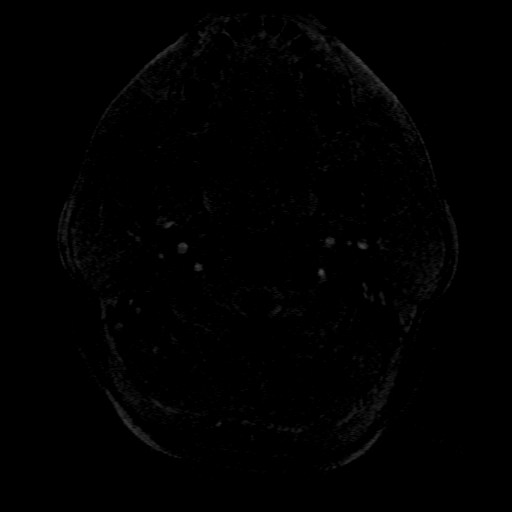
[im 9/191]
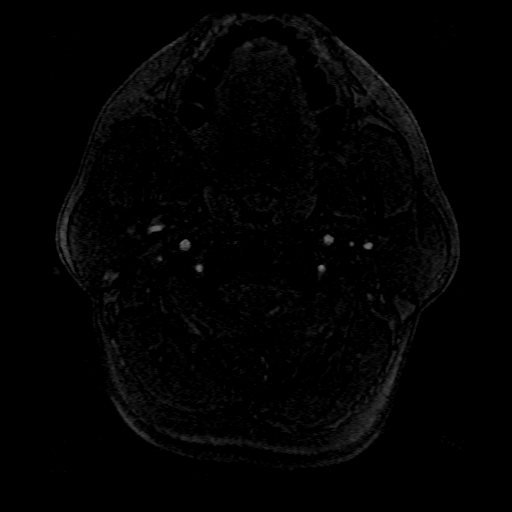
[im 13/191]
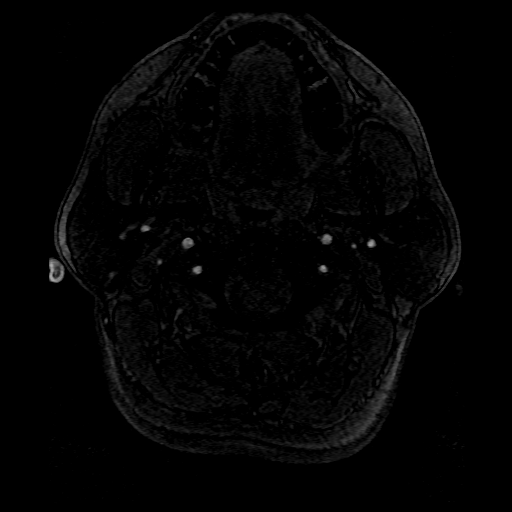
[im 17/191]
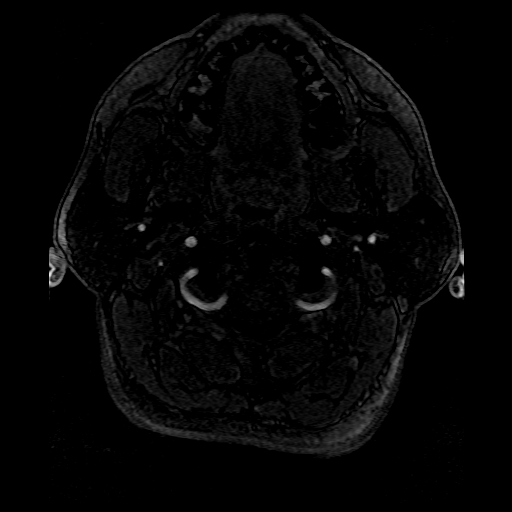
[im 21/191]
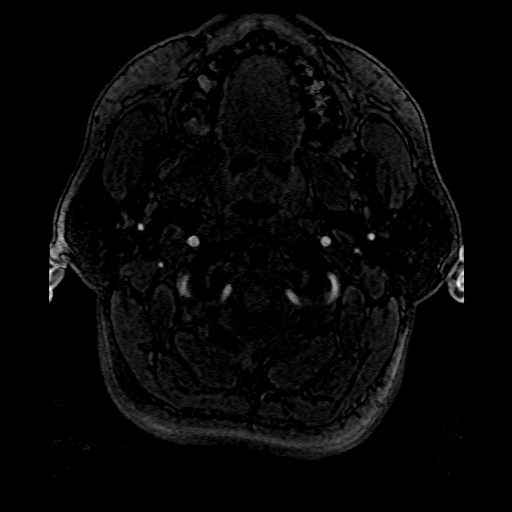
[im 25/191]
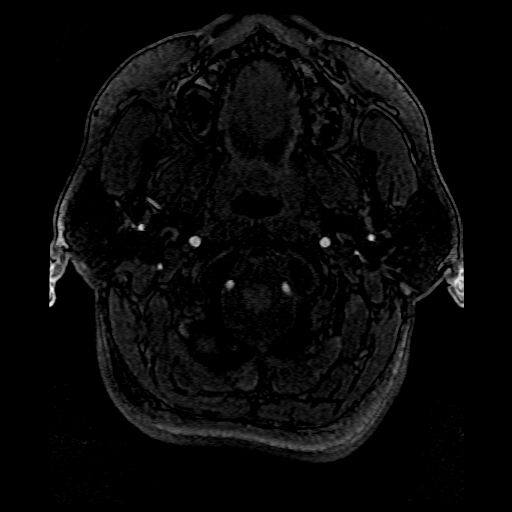
[im 29/191]
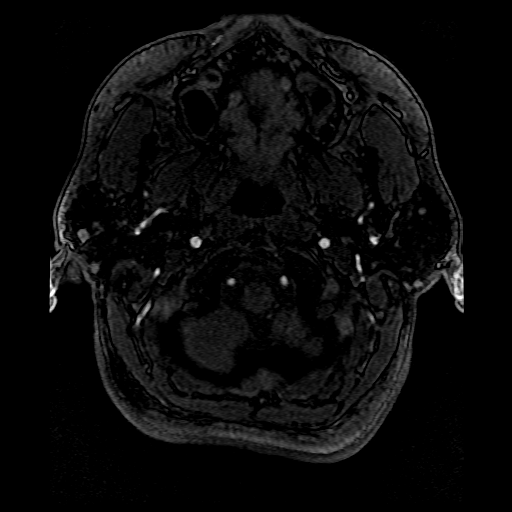
[im 33/191]
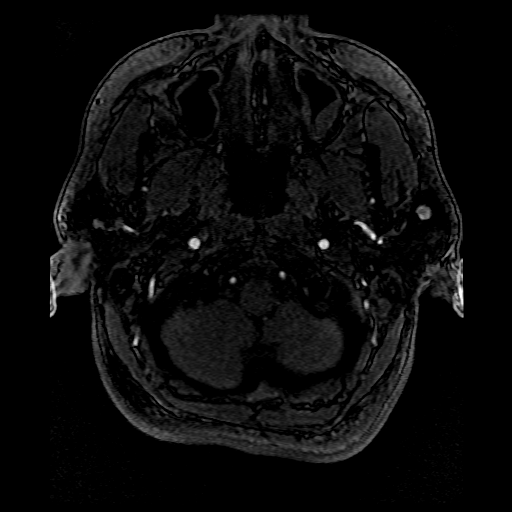
[im 37/191]
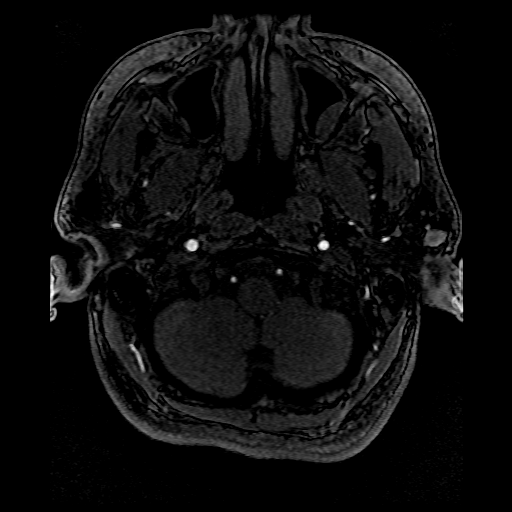
[im 41/191]
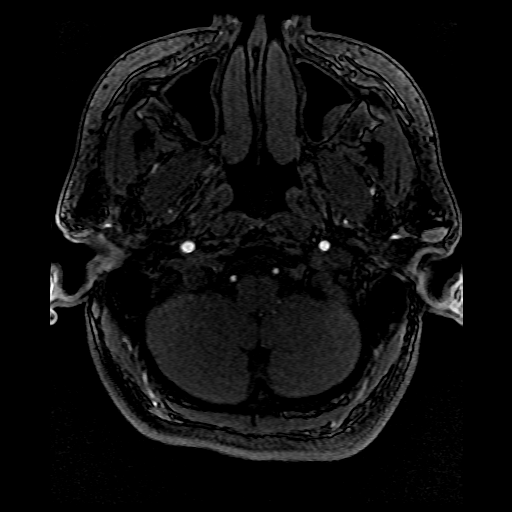
[im 45/191]
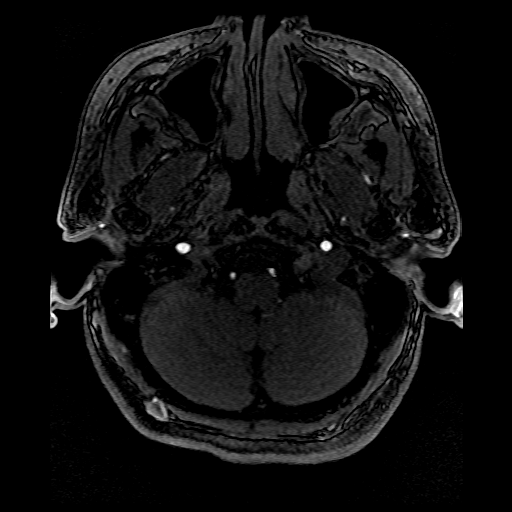
[im 61/191]
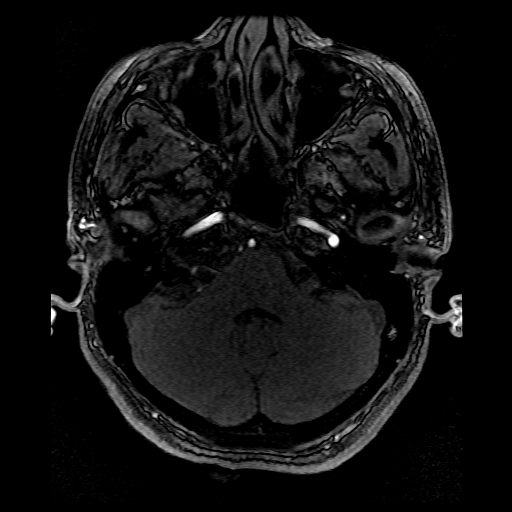
[im 85/191]
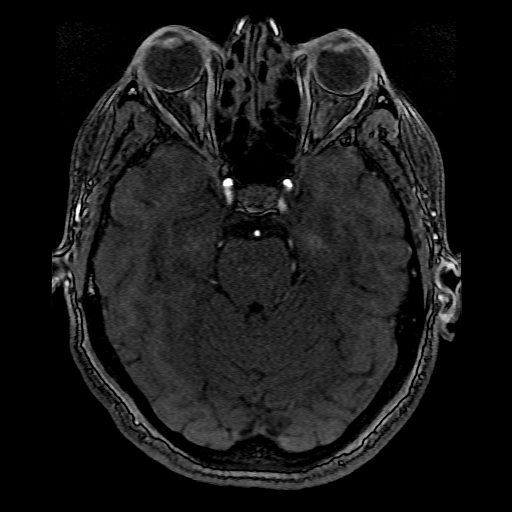
[im 98/191]
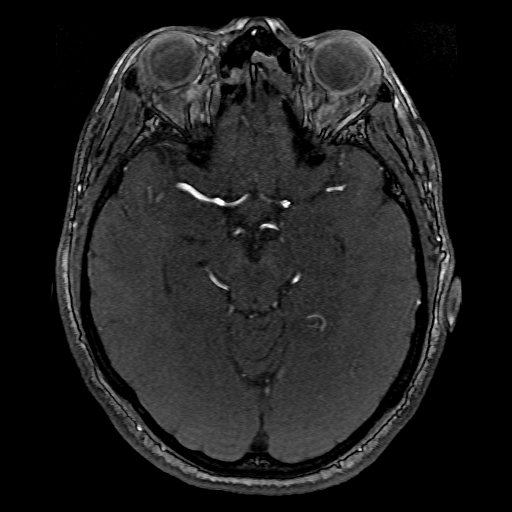
[im 110/191]
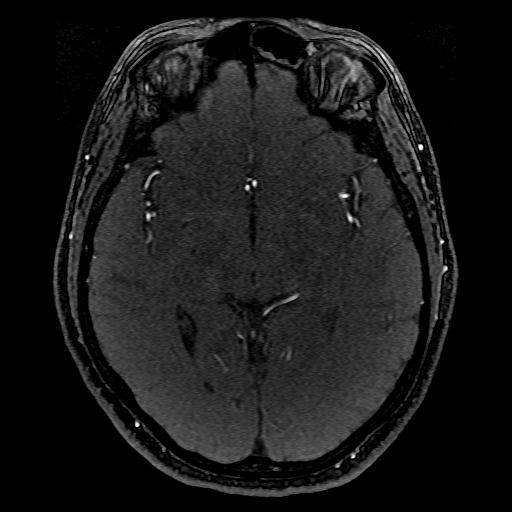
[im 134/191]
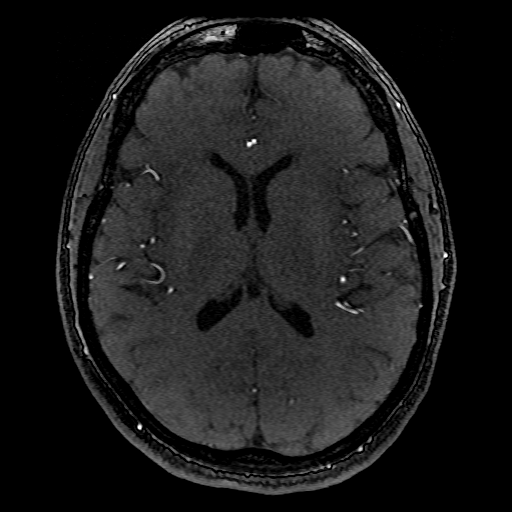
[im 158/191]
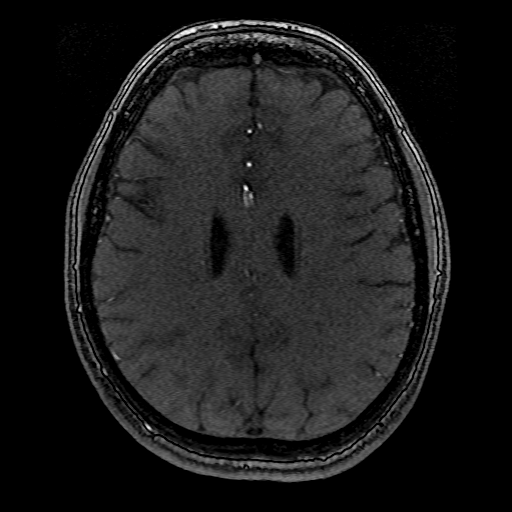
[im 162/191]
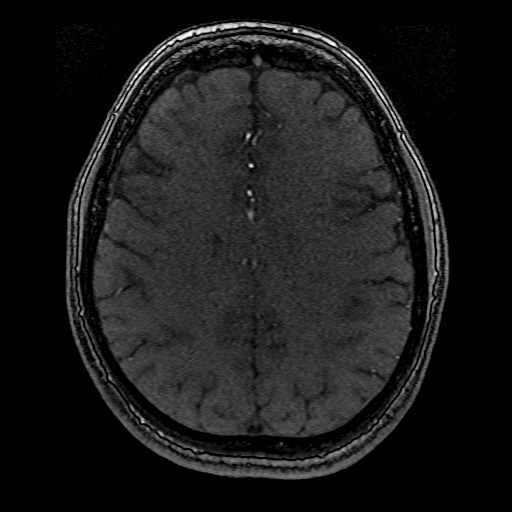
[im 182/191]
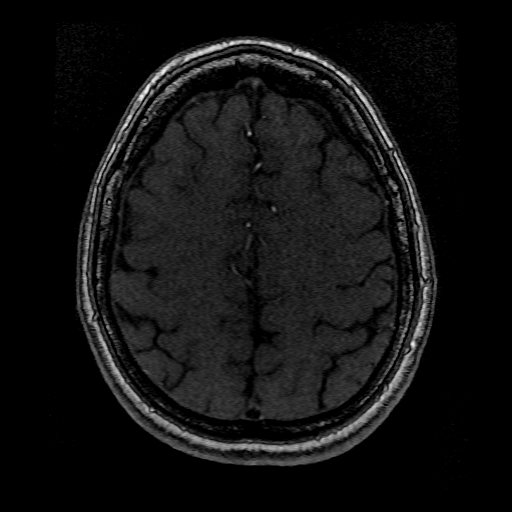

[20 of 48 positions shown; findings below may reference images not displayed]

FINDINGS: The visualized distal vertebral arteries are widely patent and
codominant. Left PICA origin is patent. Right AICA is dominant. SCA
origins are patent. Basilar artery is widely patent. There are
patent posterior communicating arteries bilaterally. There is
suggestion of mild distal right P1 stenosis.

The internal carotid arteries are patent from skullbase to carotid
termini without stenosis. ACAs and MCAs are patent without evidence
of major branch occlusion or significant stenosis. No intracranial
aneurysm is identified.
IMPRESSION: 1. Mild proximal right PCA stenosis.
2. No intracranial aneurysm or flow limiting stenosis.

## 2017-02-15 ENCOUNTER — Ambulatory Visit: Payer: Self-pay | Admitting: Internal Medicine

## 2017-02-17 IMAGING — CR DG ABDOMEN 1V
1 series · 1 of 1 positions shown · non-contrast
Comparison: Renal stone CT 10/13/2015.

CLINICAL DATA: Patient with recent diagnosis of nephrolithiasis.
Worsening pain.

EXAM:
ABDOMEN - 1 VIEW

[t abdomen supine]
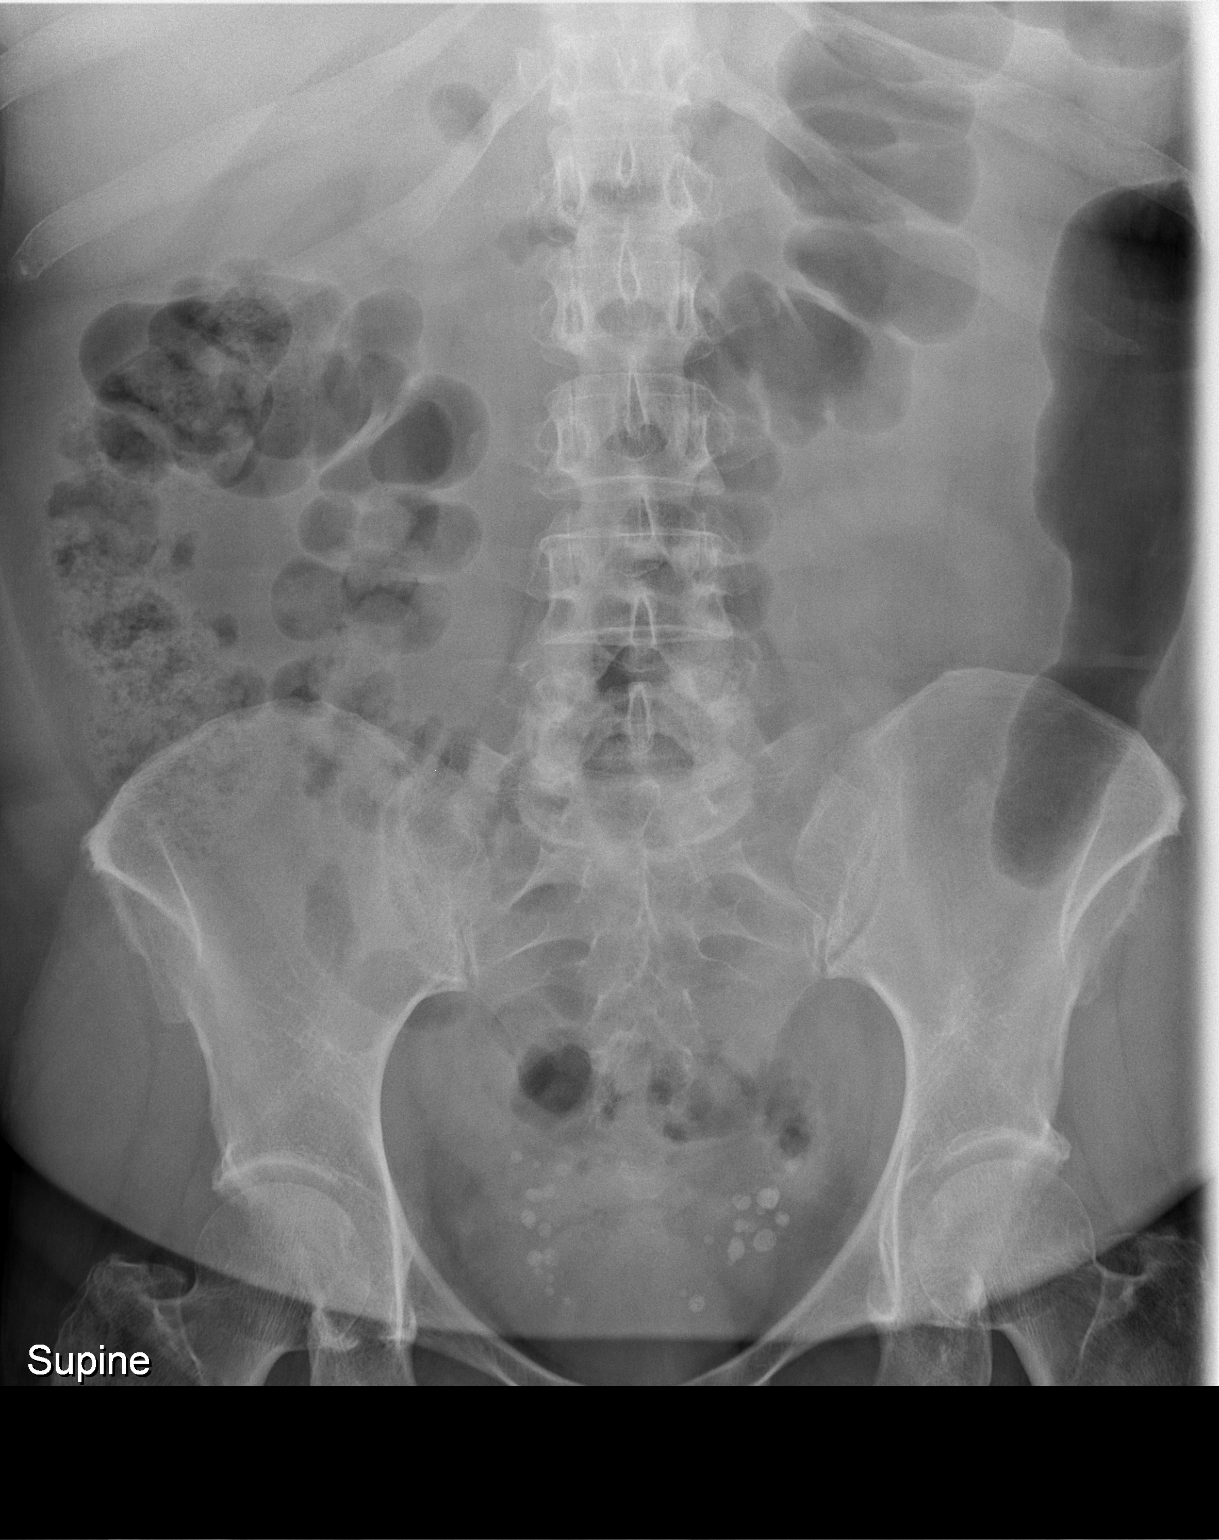

[1 of 1 positions shown; findings below may reference images not displayed]

FINDINGS: Multiple rounded calcifications are demonstrated within the pelvis,
compatible with phleboliths. Nonobstructed bowel gas pattern. Stool
within the cecum. Lumbar spine degenerative changes. No definite
calcification identified within the left hemi abdomen.
IMPRESSION: No definite calcification identified in the left hemi abdomen
correspond with small left renal stone identified on prior CT.

## 2017-02-18 IMAGING — DX DG ABDOMEN 1V
2 series · 2 of 2 positions shown · non-contrast
Comparison: 10/27/2015

CLINICAL DATA: Followup left ureteral stone

EXAM:
ABDOMEN - 1 VIEW

[abdomen kub (1 of 2)]
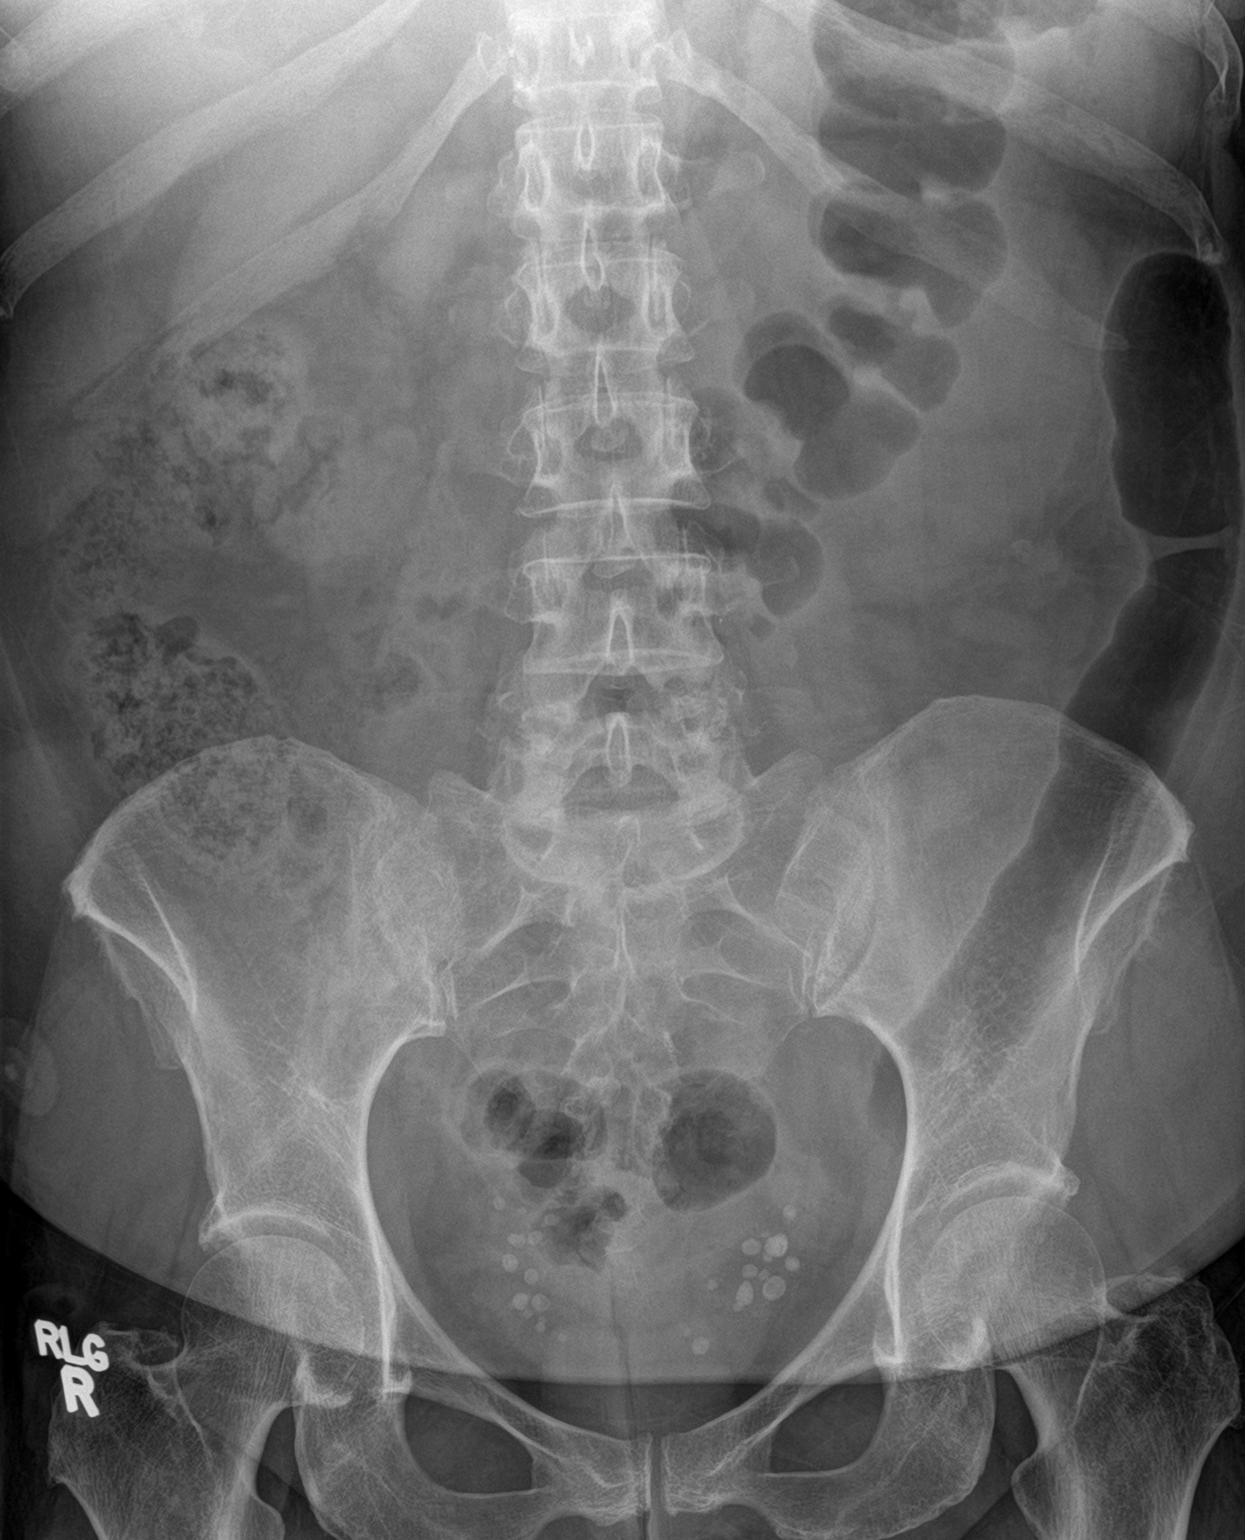

[abdomen kub (2 of 2)]
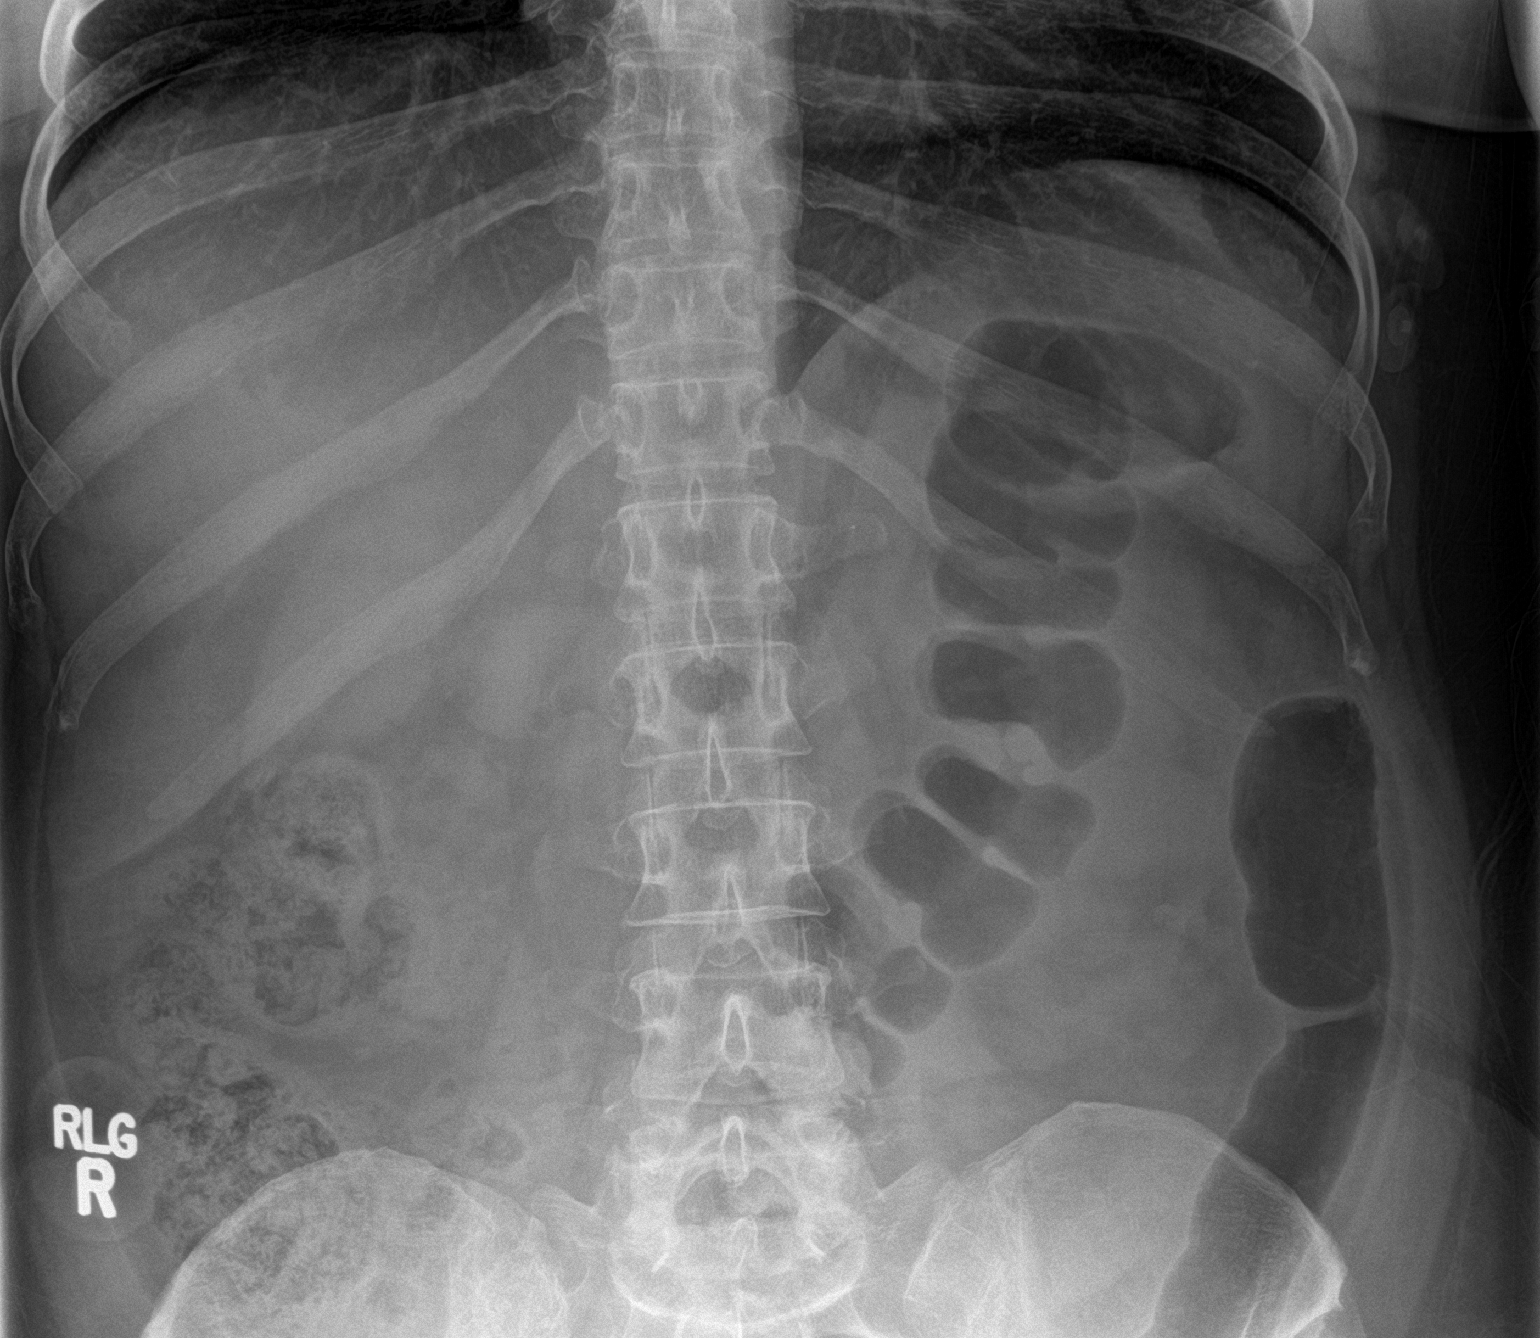

[2 of 2 positions shown; findings below may reference images not displayed]

FINDINGS: There is a radiopaque density between the transverse processes on
the left at L4 and L5 better visualized than that seen on the prior
exam which likely represents the known left ureteral stone. Multiple
phleboliths are again seen in the pelvis and stable. No other focal
abnormality is noted.
IMPRESSION: Calcification to the left of the midline likely representing a mid
left ureteral stone.

## 2017-03-29 ENCOUNTER — Ambulatory Visit: Payer: Self-pay | Admitting: Internal Medicine

## 2017-05-14 ENCOUNTER — Other Ambulatory Visit: Payer: Self-pay

## 2017-05-14 ENCOUNTER — Ambulatory Visit: Payer: Medicare Other | Attending: Family Medicine | Admitting: Family Medicine

## 2017-05-14 ENCOUNTER — Encounter: Payer: Self-pay | Admitting: Family Medicine

## 2017-05-14 VITALS — BP 135/91 | HR 75 | Temp 97.7°F | Ht 62.0 in | Wt 175.2 lb

## 2017-05-14 DIAGNOSIS — Z7982 Long term (current) use of aspirin: Secondary | ICD-10-CM | POA: Diagnosis not present

## 2017-05-14 DIAGNOSIS — M5412 Radiculopathy, cervical region: Secondary | ICD-10-CM | POA: Insufficient documentation

## 2017-05-14 DIAGNOSIS — R002 Palpitations: Secondary | ICD-10-CM

## 2017-05-14 DIAGNOSIS — M5416 Radiculopathy, lumbar region: Secondary | ICD-10-CM | POA: Diagnosis not present

## 2017-05-14 DIAGNOSIS — G4709 Other insomnia: Secondary | ICD-10-CM | POA: Diagnosis not present

## 2017-05-14 DIAGNOSIS — M5010 Cervical disc disorder with radiculopathy, unspecified cervical region: Secondary | ICD-10-CM

## 2017-05-14 DIAGNOSIS — E7800 Pure hypercholesterolemia, unspecified: Secondary | ICD-10-CM | POA: Insufficient documentation

## 2017-05-14 DIAGNOSIS — E119 Type 2 diabetes mellitus without complications: Secondary | ICD-10-CM | POA: Insufficient documentation

## 2017-05-14 DIAGNOSIS — G4700 Insomnia, unspecified: Secondary | ICD-10-CM | POA: Diagnosis not present

## 2017-05-14 DIAGNOSIS — M542 Cervicalgia: Secondary | ICD-10-CM

## 2017-05-14 DIAGNOSIS — G8929 Other chronic pain: Secondary | ICD-10-CM | POA: Diagnosis not present

## 2017-05-14 DIAGNOSIS — Z7984 Long term (current) use of oral hypoglycemic drugs: Secondary | ICD-10-CM | POA: Diagnosis not present

## 2017-05-14 DIAGNOSIS — M5030 Other cervical disc degeneration, unspecified cervical region: Secondary | ICD-10-CM | POA: Diagnosis not present

## 2017-05-14 DIAGNOSIS — E1169 Type 2 diabetes mellitus with other specified complication: Secondary | ICD-10-CM

## 2017-05-14 LAB — POCT GLYCOSYLATED HEMOGLOBIN (HGB A1C): HEMOGLOBIN A1C: 6.4

## 2017-05-14 LAB — GLUCOSE, POCT (MANUAL RESULT ENTRY): POC GLUCOSE: 146 mg/dL — AB (ref 70–99)

## 2017-05-14 MED ORDER — TRAZODONE HCL 100 MG PO TABS: 100.0000 mg | ORAL_TABLET | Freq: Every evening | ORAL | 3 refills | Status: DC | PRN

## 2017-05-14 MED ORDER — IBUPROFEN 800 MG PO TABS: 800.0000 mg | ORAL_TABLET | Freq: Two times a day (BID) | ORAL | 2 refills | Status: DC | PRN

## 2017-05-14 MED ORDER — METFORMIN HCL 500 MG PO TABS: 1000.0000 mg | ORAL_TABLET | Freq: Two times a day (BID) | ORAL | 6 refills | Status: DC

## 2017-05-14 MED ORDER — ATORVASTATIN CALCIUM 40 MG PO TABS: 40.0000 mg | ORAL_TABLET | Freq: Every day | ORAL | 3 refills | Status: DC

## 2017-05-14 MED ORDER — TRAMADOL HCL 50 MG PO TABS: 50.0000 mg | ORAL_TABLET | Freq: Four times a day (QID) | ORAL | 3 refills | Status: DC | PRN

## 2017-05-14 MED ORDER — METHOCARBAMOL 500 MG PO TABS: 500.0000 mg | ORAL_TABLET | Freq: Three times a day (TID) | ORAL | 2 refills | Status: DC | PRN

## 2017-05-14 NOTE — Patient Instructions (Signed)
Cervical Radiculopathy  Cervical radiculopathy means that a nerve in the neck is pinched or bruised. This can cause pain or loss of feeling (numbness) that runs from your neck to your arm and fingers.  Follow these instructions at home:  Managing pain  ? Take over-the-counter and prescription medicines only as told by your doctor.  ? If directed, put ice on the injured or painful area.  ? Put ice in a plastic bag.  ? Place a towel between your skin and the bag.  ? Leave the ice on for 20 minutes, 2?3 times per day.  ? If ice does not help, you can try using heat. Take a warm shower or warm bath, or use a heat pack as told by your doctor.  ? You may try a gentle neck and shoulder massage.  Activity  ? Rest as needed. Follow instructions from your doctor about any activities to avoid.  ? Do exercises as told by your doctor or physical therapist.  General instructions  ? If you were given a soft collar, wear it as told by your doctor.  ? Use a flat pillow when you sleep.  ? Keep all follow-up visits as told by your doctor. This is important.  Contact a doctor if:  ? Your condition does not improve with treatment.  Get help right away if:  ? Your pain gets worse and is not controlled with medicine.  ? You lose feeling or feel weak in your hand, arm, face, or leg.  ? You have a fever.  ? You have a stiff neck.  ? You cannot control when you poop or pee (have incontinence).  ? You have trouble with walking, balance, or talking.  This information is not intended to replace advice given to you by your health care provider. Make sure you discuss any questions you have with your health care provider.  Document Released: 05/07/2011 Document Revised: 10/24/2015 Document Reviewed: 07/12/2014  [employer] Interactive Patient Education ? 2018 [employer]

## 2017-05-15 LAB — CMP14+EGFR
ALT: 29 [IU]/L (ref 0–32)
AST: 23 [IU]/L (ref 0–40)
Albumin/Globulin Ratio: 1.7 (ref 1.2–2.2)
Albumin: 4.7 g/dL (ref 3.5–5.5)
Alkaline Phosphatase: 54 [IU]/L (ref 39–117)
BUN/Creatinine Ratio: 16 (ref 9–23)
BUN: 11 mg/dL (ref 6–24)
Bilirubin Total: 0.9 mg/dL (ref 0.0–1.2)
CO2: 23 mmol/L (ref 20–29)
Calcium: 9.7 mg/dL (ref 8.7–10.2)
Chloride: 103 mmol/L (ref 96–106)
Creatinine, Ser: 0.67 mg/dL (ref 0.57–1.00)
GFR calc Af Amer: 117 mL/min/{1.73_m2}
GFR calc non Af Amer: 101 mL/min/{1.73_m2}
Globulin, Total: 2.8 g/dL (ref 1.5–4.5)
Glucose: 115 mg/dL — ABNORMAL HIGH (ref 65–99)
Potassium: 4.6 mmol/L (ref 3.5–5.2)
Sodium: 140 mmol/L (ref 134–144)
Total Protein: 7.5 g/dL (ref 6.0–8.5)

## 2017-05-15 LAB — LIPID PANEL
CHOL/HDL RATIO: 5.2 ratio — AB (ref 0.0–4.4)
CHOLESTEROL TOTAL: 263 mg/dL — AB (ref 100–199)
HDL: 51 mg/dL (ref >39)
LDL Calculated: 180 mg/dL — ABNORMAL HIGH (ref 0–99)
Triglycerides: 159 mg/dL — ABNORMAL HIGH (ref 0–149)
VLDL CHOLESTEROL CAL: 32 mg/dL (ref 5–40)

## 2017-05-15 LAB — TSH: TSH: 2.01 u[IU]/mL (ref 0.450–4.500)

## 2017-05-16 NOTE — Progress Notes (Signed)
Subjective:  Patient ID: Jill Anderson, female    DOB: [birth year]  Age: 51 y.o. MRN: [id number]  CC: Establish Care and Diabetes   HPI Jill Anderson is a 51 year old female with a history of Type 2 Diabetes Mellitus (A1c 6.4), Hypercholesterolemia, chronic neck and chronic low back pain secondary to degenerative changes of the spine, Insomnia who presents today to establish with me as she was previously followed by Dr Adrian Blackwater.  She suffers from severe neck and back pain, notes reveal she has had imaging in keeping with degenerative changes of the lumbar and cervical spine and as per the patient spinal fusion recommended which she declined and she was also informed epidural spinal injections would be of no benefit. Pain from her neck radiates down her left arm and from her back radiates down both legs; she denies loss of sphincteric functions  She is concerned about a 7lb weight gain she has had in the last 4 months despite trying to eat healthy. She does not exercise.  Her insomnia is controlled on Valium which she received from her PCP which she takes on an as needed basis.  Past Medical History:  Diagnosis Date  . Borderline diabetic   . Diabetes mellitus without complication [facility])     Past Surgical History:  Procedure Laterality Date  . ABDOMINAL HYSTERECTOMY    . BACK SURGERY    . CYSTOSCOPY WITH RETROGRADE PYELOGRAM, URETEROSCOPY AND STENT PLACEMENT Left 10/28/2015   Procedure: CYSTOSCOPY WITH RETROGRADE PYELOGRAM, URETEROSCOPY AND STENT PLACEMENT, AND LASER;  Surgeon: Irine Seal, MD;  Location: [facility];  Service: Urology;  Laterality: Left;    No Known Allergies   Outpatient Medications Prior to Visit  Medication Sig Dispense Refill  . aspirin EC 81 MG tablet Take 1 tablet (81 mg total) by mouth daily. 30 tablet 11  . Elastic Bandages & Supports (TRUFORM ARM 85 M 15-20MMHG) MISC 1 each by Does not apply route daily. 1 each 0  . Misc. Devices (CANE) MISC 1 each by Does  not apply route as needed. 1 each 0  . psyllium (METAMUCIL) 58.6 % powder Take 1 packet by mouth 2 (two) times daily.    . diazepam (VALIUM) 5 MG tablet Take 1 tablet (5 mg total) by mouth every 12 (twelve) hours as needed for anxiety. 60 tablet 2  . ibuprofen (ADVIL,MOTRIN) 800 MG tablet Take 1 tablet (800 mg total) by mouth every 8 (eight) hours as needed. 60 tablet 2  . metFORMIN (GLUCOPHAGE) 500 MG tablet Take 1 tablet (500 mg total) by mouth 2 (two) times daily with a meal. 60 tablet 7  . methocarbamol (ROBAXIN) 500 MG tablet Take 1 tablet (500 mg total) by mouth every 8 (eight) hours as needed for muscle spasms. 30 tablet 0  . traMADol (ULTRAM) 50 MG tablet Take 1 tablet (50 mg total) by mouth every 6 (six) hours as needed for moderate pain. 60 tablet 2  . pravastatin (PRAVACHOL) 40 MG tablet Take 1 tablet (40 mg total) by mouth daily. (Patient not taking: Reported on 05/14/2017) 90 tablet 3   No facility-administered medications prior to visit.     ROS Review of Systems  Constitutional: Negative for activity change, appetite change and fatigue.  HENT: Negative for congestion, sinus pressure and sore throat.   Eyes: Negative for visual disturbance.  Respiratory: Negative for cough, chest tightness, shortness of breath and wheezing.   Cardiovascular: Negative for chest pain and palpitations.  Gastrointestinal: Negative for abdominal distention,  abdominal pain and constipation.  Endocrine: Negative for polydipsia.  Genitourinary: Negative for dysuria and frequency.  Musculoskeletal: Positive for back pain and neck pain. Negative for arthralgias.  Skin: Negative for rash.  Neurological: Negative for tremors, light-headedness and numbness.  Hematological: Does not bruise/bleed easily.  Psychiatric/Behavioral: Negative for agitation and behavioral problems.    Objective:  BP (!) 135/91   Pulse 75   Temp 97.7 F (36.5 C) (Oral)   Ht 5' 2"  (1.575 m)   Wt 175 lb 3.2 oz (79.5 kg)    SpO2 99%   BMI 32.04 kg/m   BP/Weight 05/14/2017 01/04/2017 4/58/0998  Systolic BP 338 250 539  Diastolic BP 91 74 77  Wt. (Lbs) 175.2 168.6 154  BMI 32.04 30.84 28.17      Physical Exam  Constitutional: She is oriented to person, place, and time. She appears well-developed and well-nourished.  Cardiovascular: Normal rate, normal heart sounds and intact distal pulses.  No murmur heard. Pulmonary/Chest: Effort normal and breath sounds normal. She has no wheezes. She has no rales. She exhibits no tenderness.  Abdominal: Soft. Bowel sounds are normal. She exhibits no distension and no mass. There is no tenderness.  Musculoskeletal: Normal range of motion. She exhibits tenderness (TTP of cervical and lumbar spine and on ROM; +ve straight leg raise b/l).  Neurological: She is alert and oriented to person, place, and time.  Skin: Skin is warm and dry.  Psychiatric: She has a normal mood and affect.     Lab Results  Component Value Date   HGBA1C 6.4 05/14/2017     Assessment & Plan:   1. Type 2 diabetes mellitus with other specified complication, without long-term current use of insulin (HCC) Controlled  With a1c of 6.4 Increased Metformin dose due to complain of weight gain and A1c which trended up from 6.0 to 6.4 Reduce portion sizes, ability to exercise is limited by back and neck pain - POCT glucose (manual entry) - POCT glycosylated hemoglobin (Hb A1C) - CMP14+EGFR - Lipid panel - metFORMIN (GLUCOPHAGE) 500 MG tablet; Take 2 tablets (1,000 mg total) by mouth 2 (two) times daily with a meal.  Dispense: 120 tablet; Refill: 6  2. Chronic radicular low back pain Secondary to degenerative joint disease Uncontrolled Patient is uninterested in surgical management - traMADol (ULTRAM) 50 MG tablet; Take 1 tablet (50 mg total) by mouth every 6 (six) hours as needed for moderate pain.  Dispense: 60 tablet; Refill: 3 - ibuprofen (ADVIL,MOTRIN) 800 MG tablet; Take 1 tablet (800 mg  total) by mouth every 12 (twelve) hours as needed.  Dispense: 60 tablet; Refill: 2  3. Chronic neck pain Secondary to degenerative joint disease Apply heat - traMADol (ULTRAM) 50 MG tablet; Take 1 tablet (50 mg total) by mouth every 6 (six) hours as needed for moderate pain.  Dispense: 60 tablet; Refill: 3 - ibuprofen (ADVIL,MOTRIN) 800 MG tablet; Take 1 tablet (800 mg total) by mouth every 12 (twelve) hours as needed.  Dispense: 60 tablet; Refill: 2  4. Cervical disc disorder with radiculopathy of cervical region Uncontrolled - traMADol (ULTRAM) 50 MG tablet; Take 1 tablet (50 mg total) by mouth every 6 (six) hours as needed for moderate pain.  Dispense: 60 tablet; Refill: 3 - methocarbamol (ROBAXIN) 500 MG tablet; Take 1 tablet (500 mg total) by mouth every 8 (eight) hours as needed for muscle spasms.  Dispense: 90 tablet; Refill: 2 - ibuprofen (ADVIL,MOTRIN) 800 MG tablet; Take 1 tablet (800 mg total) by mouth  every 12 (twelve) hours as needed.  Dispense: 60 tablet; Refill: 2  5. Palpitations EKG reveals NSR - TSH  6. Other insomnia She was previously on Valium prn which I have discontinued - discussed benzos should not be used for insomnia long term Low risk for withdrawal as she took this intermittently with last dose 4 days ago Commence trazodone and patient has been educated about warning signs of withdrawal and knows to take a Valium pill if it occurs Will increase Trazodone if insomnia is uncontrolled. - traZODone (DESYREL) 100 MG tablet; Take 1 tablet (100 mg total) by mouth at bedtime as needed for sleep.  Dispense: 30 tablet; Refill: 3  7. Hypercholesterolemia Uncontrolled Not taking Pravastatin due to intolerance Will try Atorvastatin Low cholesterol diet  Meds ordered this encounter  Medications  . traMADol (ULTRAM) 50 MG tablet    Sig: Take 1 tablet (50 mg total) by mouth every 6 (six) hours as needed for moderate pain.    Dispense:  60 tablet    Refill:  3  .  atorvastatin (LIPITOR) 40 MG tablet    Sig: Take 1 tablet (40 mg total) by mouth daily.    Dispense:  30 tablet    Refill:  3  . methocarbamol (ROBAXIN) 500 MG tablet    Sig: Take 1 tablet (500 mg total) by mouth every 8 (eight) hours as needed for muscle spasms.    Dispense:  90 tablet    Refill:  2  . metFORMIN (GLUCOPHAGE) 500 MG tablet    Sig: Take 2 tablets (1,000 mg total) by mouth 2 (two) times daily with a meal.    Dispense:  120 tablet    Refill:  6    Discontinue previous dose  . ibuprofen (ADVIL,MOTRIN) 800 MG tablet    Sig: Take 1 tablet (800 mg total) by mouth every 12 (twelve) hours as needed.    Dispense:  60 tablet    Refill:  2  . traZODone (DESYREL) 100 MG tablet    Sig: Take 1 tablet (100 mg total) by mouth at bedtime as needed for sleep.    Dispense:  30 tablet    Refill:  3    Discontinue Valium    Follow-up: Return in about 3 months (around 08/12/2017) for Follow-up on chronic medical conditions.   Arnoldo Morale MD

## 2017-05-20 ENCOUNTER — Other Ambulatory Visit: Payer: Self-pay

## 2017-05-20 DIAGNOSIS — M5010 Cervical disc disorder with radiculopathy, unspecified cervical region: Secondary | ICD-10-CM

## 2017-05-20 MED ORDER — METHOCARBAMOL 500 MG PO TABS: 500.0000 mg | ORAL_TABLET | Freq: Three times a day (TID) | ORAL | 2 refills | Status: DC | PRN

## 2017-08-12 ENCOUNTER — Encounter: Payer: Self-pay | Admitting: Family Medicine

## 2017-08-12 ENCOUNTER — Ambulatory Visit: Payer: Medicare HMO | Attending: Family Medicine | Admitting: Family Medicine

## 2017-08-12 VITALS — BP 132/83 | HR 63 | Temp 97.9°F | Ht 62.0 in | Wt 175.0 lb

## 2017-08-12 DIAGNOSIS — M5416 Radiculopathy, lumbar region: Secondary | ICD-10-CM | POA: Diagnosis not present

## 2017-08-12 DIAGNOSIS — E785 Hyperlipidemia, unspecified: Secondary | ICD-10-CM | POA: Insufficient documentation

## 2017-08-12 DIAGNOSIS — G4709 Other insomnia: Secondary | ICD-10-CM | POA: Diagnosis not present

## 2017-08-12 DIAGNOSIS — M545 Low back pain: Secondary | ICD-10-CM | POA: Diagnosis not present

## 2017-08-12 DIAGNOSIS — E559 Vitamin D deficiency, unspecified: Secondary | ICD-10-CM | POA: Diagnosis not present

## 2017-08-12 DIAGNOSIS — M5010 Cervical disc disorder with radiculopathy, unspecified cervical region: Secondary | ICD-10-CM | POA: Diagnosis not present

## 2017-08-12 DIAGNOSIS — J018 Other acute sinusitis: Secondary | ICD-10-CM

## 2017-08-12 DIAGNOSIS — G4700 Insomnia, unspecified: Secondary | ICD-10-CM | POA: Diagnosis not present

## 2017-08-12 DIAGNOSIS — G8929 Other chronic pain: Secondary | ICD-10-CM | POA: Diagnosis not present

## 2017-08-12 DIAGNOSIS — M5412 Radiculopathy, cervical region: Secondary | ICD-10-CM | POA: Insufficient documentation

## 2017-08-12 DIAGNOSIS — E1169 Type 2 diabetes mellitus with other specified complication: Secondary | ICD-10-CM

## 2017-08-12 DIAGNOSIS — R5383 Other fatigue: Secondary | ICD-10-CM

## 2017-08-12 DIAGNOSIS — Z1231 Encounter for screening mammogram for malignant neoplasm of breast: Secondary | ICD-10-CM

## 2017-08-12 DIAGNOSIS — J329 Chronic sinusitis, unspecified: Secondary | ICD-10-CM | POA: Insufficient documentation

## 2017-08-12 LAB — POCT GLYCOSYLATED HEMOGLOBIN (HGB A1C): Hemoglobin A1C: 6.6

## 2017-08-12 LAB — GLUCOSE, POCT (MANUAL RESULT ENTRY): POC Glucose: 123 mg/dl — AB (ref 70–99)

## 2017-08-12 MED ORDER — METFORMIN HCL 500 MG PO TABS: 1000.0000 mg | ORAL_TABLET | Freq: Two times a day (BID) | ORAL | 6 refills | Status: DC

## 2017-08-12 MED ORDER — EZETIMIBE 10 MG PO TABS: 10.0000 mg | ORAL_TABLET | Freq: Every day | ORAL | 3 refills | Status: DC

## 2017-08-12 MED ORDER — METHOCARBAMOL 500 MG PO TABS: 500.0000 mg | ORAL_TABLET | Freq: Three times a day (TID) | ORAL | 2 refills | Status: DC | PRN

## 2017-08-12 MED ORDER — CETIRIZINE HCL 10 MG PO TABS: 10.0000 mg | ORAL_TABLET | Freq: Every day | ORAL | 1 refills | Status: DC

## 2017-08-12 MED ORDER — TRAMADOL HCL 50 MG PO TABS: 50.0000 mg | ORAL_TABLET | Freq: Four times a day (QID) | ORAL | 2 refills | Status: DC | PRN

## 2017-08-12 MED ORDER — IBUPROFEN 800 MG PO TABS: 800.0000 mg | ORAL_TABLET | Freq: Two times a day (BID) | ORAL | 2 refills | Status: DC | PRN

## 2017-08-12 MED ORDER — TRAZODONE HCL 100 MG PO TABS: 100.0000 mg | ORAL_TABLET | Freq: Every evening | ORAL | 3 refills | Status: DC | PRN

## 2017-08-12 NOTE — Progress Notes (Signed)
Subjective:  Patient ID: Jill [name], female    DOB: March 18, 1966  Age: 52 y.o. MRN: [id number]  CC: Diabetes   HPI Jill Anderson is a 52 year old female with a history of Type 2 Diabetes Mellitus (A1c 6.6), Hypercholesterolemia, chronic neck and chronic low back pain secondary to degenerative changes of the spine, Insomnia who presents today accompanied by her spouse for a follow up visit.  She complains of a 3 week history of intermittent fatigue, malaise, vertigo headaches which are frontal but denies fever; she has also noticed decreased energy. Denies nasal congestion but has had post nasal drip.  She suffers from severe neck and back pain, notes reveal she has had imaging in keeping with degenerative changes of the lumbar and cervical spine and as per the patient spinal fusion recommended which she declined and she was also informed epidural spinal injections would be of no benefit. Pain from her neck radiates down her left arm and from her back radiates down both legs; she denies loss of sphincteric functions and pain is bearable when she takes Tramadol, Ibuprofen and her muscle relaxant.  She was unable to tolerate Atorvastatin due to leg cramps which she also had with Pravastatin and has been taking Red yeast rice instead. Doing well on her other medications and denies adverse effects. Her insomnia is controlled on Trazodone.  Past Medical History:  Diagnosis Date  . Borderline diabetic   . Diabetes mellitus without complication [facility](HCC)     Past Surgical History:  Procedure Laterality Date  . ABDOMINAL HYSTERECTOMY    . BACK SURGERY    . CYSTOSCOPY WITH RETROGRADE PYELOGRAM, URETEROSCOPY AND STENT PLACEMENT Left 10/28/2015   Procedure: CYSTOSCOPY WITH RETROGRADE PYELOGRAM, URETEROSCOPY AND STENT PLACEMENT, AND LASER;  Surgeon: Bjorn PippinJohn Wrenn, MD;  Location: [facility];  Service: Urology;  Laterality: Left;    Allergies  Allergen Reactions  . Atorvastatin Other (See Comments)   myalgia     Outpatient Medications Prior to Visit  Medication Sig Dispense Refill  . aspirin EC 81 MG tablet Take 1 tablet (81 mg total) by mouth daily. 30 tablet 11  . Elastic Bandages & Supports (TRUFORM ARM SLEEVE M 15-20MMHG) MISC 1 each by Does not apply route daily. 1 each 0  . Misc. Devices (CANE) MISC 1 each by Does not apply route as needed. 1 each 0  . ibuprofen (ADVIL,MOTRIN) 800 MG tablet Take 1 tablet (800 mg total) by mouth every 12 (twelve) hours as needed. 60 tablet 2  . metFORMIN (GLUCOPHAGE) 500 MG tablet Take 2 tablets (1,000 mg total) by mouth 2 (two) times daily with a meal. 120 tablet 6  . methocarbamol (ROBAXIN) 500 MG tablet Take 1 tablet (500 mg total) by mouth every 8 (eight) hours as needed for muscle spasms. 90 tablet 2  . traMADol (ULTRAM) 50 MG tablet Take 1 tablet (50 mg total) by mouth every 6 (six) hours as needed for moderate pain. 60 tablet 3  . traZODone (DESYREL) 100 MG tablet Take 1 tablet (100 mg total) by mouth at bedtime as needed for sleep. 30 tablet 3  . psyllium (METAMUCIL) 58.6 % powder Take 1 packet by mouth 2 (two) times daily.    Marland Kitchen. atorvastatin (LIPITOR) 40 MG tablet Take 1 tablet (40 mg total) by mouth daily. (Patient not taking: Reported on 08/12/2017) 30 tablet 3   No facility-administered medications prior to visit.     ROS Review of Systems  Constitutional: Positive for fatigue. Negative for activity change  and appetite change.  HENT: Negative for congestion, sinus pressure and sore throat.   Eyes: Negative for visual disturbance.  Respiratory: Negative for cough, chest tightness, shortness of breath and wheezing.   Cardiovascular: Negative for chest pain and palpitations.  Gastrointestinal: Negative for abdominal distention, abdominal pain and constipation.  Endocrine: Negative for polydipsia.  Genitourinary: Negative for dysuria and frequency.  Musculoskeletal: Negative for arthralgias and back pain.  Skin: Negative for rash.    Neurological: Positive for light-headedness and headaches. Negative for tremors and numbness.  Hematological: Does not bruise/bleed easily.  Psychiatric/Behavioral: Negative for agitation and behavioral problems.    Objective:  BP 132/83   Pulse 63   Temp 97.9 F (36.6 C) (Oral)   Ht 5\' 2"  (1.575 m)   Wt 175 lb (79.4 kg)   SpO2 97%   BMI 32.01 kg/m   BP/Weight 08/12/2017 05/14/2017 01/04/2017  Systolic BP 132 135 117  Diastolic BP 83 91 74  Wt. (Lbs) 175 175.2 168.6  BMI 32.01 32.04 30.84      Physical Exam  Constitutional: She is oriented to person, place, and time. She appears well-developed and well-nourished.  Cardiovascular: Normal rate, normal heart sounds and intact distal pulses.  No murmur heard. Pulmonary/Chest: Effort normal and breath sounds normal. She has no wheezes. She has no rales. She exhibits no tenderness.  Abdominal: Soft. Bowel sounds are normal. She exhibits no distension and no mass. There is no tenderness.  Musculoskeletal: Normal range of motion. She exhibits tenderness (TTP of lumbar spine; positive straight leg raise b/l).  Active ROM of L arm limited to 90 degrees; ROM of R arm is normal  Neurological: She is alert and oriented to person, place, and time.  Motor strength: LUE 3+/5; RUE 5/5 LE 5/5 b/l  Skin: Skin is warm and dry.  Psychiatric: She has a normal mood and affect.    Lab Results  Component Value Date   HGBA1C 6.6 08/12/2017    Assessment & Plan:   1. Type 2 diabetes mellitus with other specified complication, without long-term current use of insulin (HCC) Controlled with A1c of 6.6 Continue current management, diabetic diet, lifestyle managment - POCT glucose (manual entry) - POCT glycosylated hemoglobin (Hb A1C) - Microalbumin/Creatinine Ratio, Urine - Lipid panel - metFORMIN (GLUCOPHAGE) 500 MG tablet; Take 2 tablets (1,000 mg total) by mouth 2 (two) times daily with a meal.  Dispense: 120 tablet; Refill: 6  2. Other  insomnia Controlled - traZODone (DESYREL) 100 MG tablet; Take 1 tablet (100 mg total) by mouth at bedtime as needed for sleep.  Dispense: 30 tablet; Refill: 3  3. Chronic radicular low back pain Uncontrolled but improved on current regimen Declines spine surgery - traMADol (ULTRAM) 50 MG tablet; Take 1 tablet (50 mg total) by mouth every 6 (six) hours as needed for moderate pain.  Dispense: 60 tablet; Refill: 2 - ibuprofen (ADVIL,MOTRIN) 800 MG tablet; Take 1 tablet (800 mg total) by mouth every 12 (twelve) hours as needed.  Dispense: 60 tablet; Refill: 2  4. Cervical disc disorder with radiculopathy of cervical region Could explain upper extremity symptoms Declines spinal fusion Signed controlled substances agreement today - traMADol (ULTRAM) 50 MG tablet; Take 1 tablet (50 mg total) by mouth every 6 (six) hours as needed for moderate pain.  Dispense: 60 tablet; Refill: 2 - methocarbamol (ROBAXIN) 500 MG tablet; Take 1 tablet (500 mg total) by mouth every 8 (eight) hours as needed for muscle spasms.  Dispense: 90 tablet; Refill: 2 -  ibuprofen (ADVIL,MOTRIN) 800 MG tablet; Take 1 tablet (800 mg total) by mouth every 12 (twelve) hours as needed.  Dispense: 60 tablet; Refill: 2  5. Other subacute sinusitis Could explain symptoms above Placed on Zyrtec, use analgesics prn , rest and increase hydration  6. Other fatigue - VITAMIN D 25 Hydroxy (Vit-D Deficiency, Fractures) - CBC with Differential/Platelet  7. Visit for screening mammogram - MM Digital Screening; Future  8. Hyperlipidemia Unable to tolerate statin due to myalgia Placed on Zetia, continue fish oil caps Meds ordered this encounter  Medications  . cetirizine (ZYRTEC) 10 MG tablet    Sig: Take 1 tablet (10 mg total) by mouth daily.    Dispense:  30 tablet    Refill:  1  . ezetimibe (ZETIA) 10 MG tablet    Sig: Take 1 tablet (10 mg total) by mouth daily.    Dispense:  30 tablet    Refill:  3    Discontinue atorvastatin   . metFORMIN (GLUCOPHAGE) 500 MG tablet    Sig: Take 2 tablets (1,000 mg total) by mouth 2 (two) times daily with a meal.    Dispense:  120 tablet    Refill:  6  . traZODone (DESYREL) 100 MG tablet    Sig: Take 1 tablet (100 mg total) by mouth at bedtime as needed for sleep.    Dispense:  30 tablet    Refill:  3  . traMADol (ULTRAM) 50 MG tablet    Sig: Take 1 tablet (50 mg total) by mouth every 6 (six) hours as needed for moderate pain.    Dispense:  60 tablet    Refill:  2  . methocarbamol (ROBAXIN) 500 MG tablet    Sig: Take 1 tablet (500 mg total) by mouth every 8 (eight) hours as needed for muscle spasms.    Dispense:  90 tablet    Refill:  2  . ibuprofen (ADVIL,MOTRIN) 800 MG tablet    Sig: Take 1 tablet (800 mg total) by mouth every 12 (twelve) hours as needed.    Dispense:  60 tablet    Refill:  2    Follow-up: Return in about 3 months (around 11/12/2017) for Follow-up of chronic medical conditions.   Hoy Register MD

## 2017-08-12 NOTE — Progress Notes (Signed)
Patient is having pain over entire body. Patient also states that she has no energy.

## 2017-08-12 NOTE — Patient Instructions (Signed)
Cervical Radiculopathy  Cervical radiculopathy means that a nerve in the neck is pinched or bruised. This can cause pain or loss of feeling (numbness) that runs from your neck to your arm and fingers.  Follow these instructions at home:  Managing pain  ? Take over-the-counter and prescription medicines only as told by your doctor.  ? If directed, put ice on the injured or painful area.  ? Put ice in a plastic bag.  ? Place a towel between your skin and the bag.  ? Leave the ice on for 20 minutes, 2?3 times per day.  ? If ice does not help, you can try using heat. Take a warm shower or warm bath, or use a heat pack as told by your doctor.  ? You may try a gentle neck and shoulder massage.  Activity  ? Rest as needed. Follow instructions from your doctor about any activities to avoid.  ? Do exercises as told by your doctor or physical therapist.  General instructions  ? If you were given a soft collar, wear it as told by your doctor.  ? Use a flat pillow when you sleep.  ? Keep all follow-up visits as told by your doctor. This is important.  Contact a doctor if:  ? Your condition does not improve with treatment.  Get help right away if:  ? Your pain gets worse and is not controlled with medicine.  ? You lose feeling or feel weak in your hand, arm, face, or leg.  ? You have a fever.  ? You have a stiff neck.  ? You cannot control when you poop or pee (have incontinence).  ? You have trouble with walking, balance, or talking.  This information is not intended to replace advice given to you by your health care provider. Make sure you discuss any questions you have with your health care provider.  Document Released: 05/07/2011 Document Revised: 10/24/2015 Document Reviewed: 07/12/2014  [employer] Interactive Patient Education ? 2018 [employer]

## 2017-08-13 ENCOUNTER — Encounter: Payer: Self-pay | Admitting: Family Medicine

## 2017-08-13 LAB — CBC WITH DIFFERENTIAL/PLATELET
BASOS ABS: 0 10*3/uL (ref 0.0–0.2)
Basos: 1 %
EOS (ABSOLUTE): 0.3 10*3/uL (ref 0.0–0.4)
EOS: 5 %
HEMOGLOBIN: 14.3 g/dL (ref 11.1–15.9)
Hematocrit: 43 % (ref 34.0–46.6)
IMMATURE GRANS (ABS): 0 10*3/uL (ref 0.0–0.1)
Immature Granulocytes: 0 %
LYMPHS: 49 %
Lymphocytes Absolute: 3 10*3/uL (ref 0.7–3.1)
MCH: 30.3 pg (ref 26.6–33.0)
MCHC: 33.3 g/dL (ref 31.5–35.7)
MCV: 91 fL (ref 79–97)
MONOCYTES: 4 %
Monocytes Absolute: 0.3 10*3/uL (ref 0.1–0.9)
NEUTROS ABS: 2.6 10*3/uL (ref 1.4–7.0)
Neutrophils: 41 %
Platelets: 332 10*3/uL (ref 150–379)
RBC: 4.72 x10E6/uL (ref 3.77–5.28)
RDW: 12.7 % (ref 12.3–15.4)
WBC: 6.3 10*3/uL (ref 3.4–10.8)

## 2017-08-13 LAB — VITAMIN D 25 HYDROXY (VIT D DEFICIENCY, FRACTURES): VIT D 25 HYDROXY: 16.5 ng/mL — AB (ref 30.0–100.0)

## 2017-08-13 LAB — LIPID PANEL
Chol/HDL Ratio: 3.9 ratio (ref 0.0–4.4)
Cholesterol, Total: 203 mg/dL — ABNORMAL HIGH (ref 100–199)
HDL: 52 mg/dL (ref >39)
LDL CALC: 134 mg/dL — AB (ref 0–99)
TRIGLYCERIDES: 83 mg/dL (ref 0–149)
VLDL CHOLESTEROL CAL: 17 mg/dL (ref 5–40)

## 2017-08-13 LAB — MICROALBUMIN / CREATININE URINE RATIO
CREATININE, UR: 145.8 mg/dL
MICROALBUM., U, RANDOM: 3.8 ug/mL
Microalb/Creat Ratio: 2.6 mg/g creat (ref 0.0–30.0)

## 2017-08-13 MED ORDER — ERGOCALCIFEROL 1.25 MG (50000 UT) PO CAPS: 50000.0000 [IU] | ORAL_CAPSULE | ORAL | 1 refills | Status: DC

## 2017-09-08 ENCOUNTER — Ambulatory Visit: Payer: Medicare HMO | Attending: Family Medicine | Admitting: Physician Assistant

## 2017-09-08 ENCOUNTER — Ambulatory Visit (HOSPITAL_COMMUNITY)
Admission: RE | Admit: 2017-09-08 | Discharge: 2017-09-08 | Disposition: A | Discharge status: Home / Self Care | Payer: Medicare HMO | Source: Ambulatory Visit | Attending: Physician Assistant | Admitting: Physician Assistant

## 2017-09-08 VITALS — BP 134/83 | HR 63 | Temp 98.0°F | Resp 18 | Ht 62.0 in | Wt 175.4 lb

## 2017-09-08 DIAGNOSIS — Z79899 Other long term (current) drug therapy: Secondary | ICD-10-CM | POA: Diagnosis not present

## 2017-09-08 DIAGNOSIS — E1165 Type 2 diabetes mellitus with hyperglycemia: Secondary | ICD-10-CM | POA: Insufficient documentation

## 2017-09-08 DIAGNOSIS — M25512 Pain in left shoulder: Secondary | ICD-10-CM | POA: Diagnosis not present

## 2017-09-08 DIAGNOSIS — Z7982 Long term (current) use of aspirin: Secondary | ICD-10-CM | POA: Insufficient documentation

## 2017-09-08 DIAGNOSIS — Z79891 Long term (current) use of opiate analgesic: Secondary | ICD-10-CM | POA: Insufficient documentation

## 2017-09-08 DIAGNOSIS — Z794 Long term (current) use of insulin: Secondary | ICD-10-CM | POA: Diagnosis not present

## 2017-09-08 LAB — GLUCOSE, POCT (MANUAL RESULT ENTRY): POC GLUCOSE: 118 mg/dL — AB (ref 70–99)

## 2017-09-08 MED ORDER — MELOXICAM 7.5 MG PO TABS: 7.5000 mg | ORAL_TABLET | Freq: Every day | ORAL | 0 refills | Status: DC

## 2017-09-08 NOTE — Patient Instructions (Signed)
Bursitis Bursitis is when the fluid-filled sac (bursa) that covers and protects a joint is swollen (inflamed). Bursitis is most common near joints, especially the knees, elbows, hips, and shoulders. Follow these instructions at home:  Take medicines only as told by your doctor.  If you were prescribed an antibiotic medicine, finish it all even if you start to feel better.  Rest the affected area as told by your doctor. ? Keep the area raised up. ? Avoid doing things that make the pain worse.  Apply ice to the injured area: ? Place ice in a plastic bag. ? Place a towel between your skin and the bag. ? Leave the ice on for 20 minutes, 2-3 times a day.  Use splints, braces, pads, or walking aids as told by your doctor.  Keep all follow-up visits as told by your doctor. This is important. Contact a doctor if:  You have more pain with home care.  You have a fever.  You have chills. This information is not intended to replace advice given to you by your health care provider. Make sure you discuss any questions you have with your health care provider. Document Released: 11/05/2009 Document Revised: 10/24/2015 Document Reviewed: 08/07/2013 Elsevier Interactive Patient Education  2018 Elsevier Inc.  

## 2017-09-08 NOTE — Progress Notes (Signed)
Patient ID: Jill Anderson, female   DOB: [birth year], 52 y.o.   MRN: [id number]   Placer [name], is a 52 y.o. female  [id number]  [id number]  DOB - [birth year]  Subjective:  Chief Complaint and HPI: Jill Anderson is a 52 y.o. female here today for L shoulder pain for about 3 weeks.  Has known DJD and bulging discs in her neck.  She denies new numbness or weakness.  NKI.  No overuse that she can remember.   ROS:   Constitutional:  No f/c, No night sweats, No unexplained weight loss. EENT:  No vision changes, No blurry vision, No hearing changes. No mouth, throat, or ear problems.  Respiratory: No cough, No SOB Cardiac: No CP, no palpitations GI:  No abd pain, No N/V/D. GU: No Urinary s/sx Musculoskeletal: +L shoulder pain Neuro: No headache, no dizziness, no motor weakness.  Skin: No rash Endocrine:  No polydipsia. No polyuria.  Psych: Denies SI/HI  No problems updated.  ALLERGIES: Allergies  Allergen Reactions  . Atorvastatin Other (See Comments)    myalgia    PAST MEDICAL HISTORY: Past Medical History:  Diagnosis Date  . Borderline diabetic   . Diabetes mellitus without complication (HCC)     MEDICATIONS AT HOME: Prior to Admission medications   Medication Sig Start Date End Date Taking? Authorizing Provider  aspirin EC 81 MG tablet Take 1 tablet (81 mg total) by mouth daily. 09/29/16  Yes Funches, Josalyn, MD  cetirizine (ZYRTEC) 10 MG tablet Take 1 tablet (10 mg total) by mouth daily. 08/12/17  Yes Hoy RegisterNewlin, Enobong, MD  Elastic Bandages & Supports (TRUFORM ARM SLEEVE M 15-20MMHG) MISC 1 each by Does not apply route daily. 03/20/16  Yes Funches, Josalyn, MD  ergocalciferol (DRISDOL) 50000 units capsule Take 1 capsule (50,000 Units total) by mouth once a week. 08/13/17  Yes Hoy RegisterNewlin, Enobong, MD  ezetimibe (ZETIA) 10 MG tablet Take 1 tablet (10 mg total) by mouth daily. 08/12/17  Yes Hoy RegisterNewlin, Enobong, MD  ibuprofen (ADVIL,MOTRIN) 800 MG tablet Take 1 tablet (800 mg total) by  mouth every 12 (twelve) hours as needed. 08/12/17  Yes Hoy RegisterNewlin, Enobong, MD  metFORMIN (GLUCOPHAGE) 500 MG tablet Take 2 tablets (1,000 mg total) by mouth 2 (two) times daily with a meal. 08/12/17  Yes Newlin, Enobong, MD  methocarbamol (ROBAXIN) 500 MG tablet Take 1 tablet (500 mg total) by mouth every 8 (eight) hours as needed for muscle spasms. 08/12/17  Yes Hoy RegisterNewlin, Enobong, MD  Misc. Devices (CANE) MISC 1 each by Does not apply route as needed. 03/04/15  Yes Funches, Josalyn, MD  psyllium (METAMUCIL) 58.6 % powder Take 1 packet by mouth 2 (two) times daily.   Yes [provider]  traMADol (ULTRAM) 50 MG tablet Take 1 tablet (50 mg total) by mouth every 6 (six) hours as needed for moderate pain. 08/12/17  Yes Hoy RegisterNewlin, Enobong, MD  traZODone (DESYREL) 100 MG tablet Take 1 tablet (100 mg total) by mouth at bedtime as needed for sleep. 08/12/17  Yes Hoy RegisterNewlin, Enobong, MD  meloxicam (MOBIC) 7.5 MG tablet Take 1 tablet (7.5 mg total) by mouth daily. 09/08/17   Anders SimmondsMcClung, Abijah Roussel M, PA-C     Objective:  EXAM:   Vitals:   09/08/17 1035  BP: 134/83  Pulse: 63  Resp: 18  Temp: 98 F (36.7 C)  TempSrc: Oral  SpO2: 97%  Weight: 175 lb 6.4 oz (79.6 kg)  Height: 5\' 2"  (1.575 m)    General appearance : A&OX3. NAD. Non-toxic-appearing HEENT:  Atraumatic and Normocephalic.  PERRLA. EOM intact.   Neck: supple, no JVD. No cervical lymphadenopathy. No thyromegaly Chest/Lungs:  Breathing-non-labored, Good air entry bilaterally, breath sounds normal without rales, rhonchi, or wheezing  CVS: S1 S2 regular, no murmurs, gallops, rubs  Extremities: LUE with only about 20%ROM due to pain.  There is TTP over the bursa of the shoulder.  No erythema or obvious swelling.  Normal grip and hand strength.  DTR=B.  Bilateral Lower Ext shows no edema, both legs are warm to touch with = pulse throughout Neurology:  CN II-XII grossly intact, Non focal.   Psych:  TP linear. J/I WNL. Normal speech. Appropriate eye contact  and affect.  Skin:  No Rash  Data Review Lab Results  Component Value Date   HGBA1C 6.6 08/12/2017   HGBA1C 6.4 05/14/2017   HGBA1C 6.0 01/04/2017     Assessment & Plan   1. Acute pain of left shoulder If worsens to ED if concern for impingement from neck issues.   -sling for comfort as needed.  Ok to continue tramadol as needed.   - DG Shoulder Left; Future - meloxicam (MOBIC) 7.5 MG tablet; Take 1 tablet (7.5 mg total) by mouth daily.  Dispense: 30 tablet; Refill: 0 - Ambulatory referral to Orthopedic Surgery  2. Type 2 diabetes mellitus with hyperglycemia, unspecified whether long term insulin use (HCC) Controlled-continue current regimen - Glucose (CBG)   Patient have been counseled extensively about nutrition and exercise  Return for keep June appt .  The patient was given clear instructions to go to ER or return to medical center if symptoms don't improve, worsen or new problems develop. The patient verbalized understanding. The patient was told to call to get lab results if they haven't heard anything in the next week.     Jill Co, PA-C [facility] and [facility] [place], [place] [phone]   09/08/2017, 10:49 AM

## 2017-09-09 ENCOUNTER — Telehealth: Payer: Self-pay

## 2017-09-09 NOTE — Telephone Encounter (Signed)
-----   Message from Anders SimmondsAngela M McClung, [place] sent at 09/09/2017  8:36 AM EDT ----- Your shoulder xray was normal.  Orthopedics referral was placed.  Go to the emergency department if you worsen. Thanks, Georgian CoAngela McClung, PA-C

## 2017-09-09 NOTE — Telephone Encounter (Signed)
CMA call regarding x ray results   Patient Verify DOB   Patient was aware and understood  

## 2017-09-21 ENCOUNTER — Encounter: Payer: Self-pay | Admitting: Family Medicine

## 2017-09-21 DIAGNOSIS — E559 Vitamin D deficiency, unspecified: Secondary | ICD-10-CM | POA: Insufficient documentation

## 2017-10-01 ENCOUNTER — Ambulatory Visit
Admission: RE | Admit: 2017-10-01 | Discharge: 2017-10-01 | Disposition: A | Discharge status: Home / Self Care | Payer: Medicare HMO | Source: Ambulatory Visit | Attending: Family Medicine | Admitting: Family Medicine

## 2017-10-01 DIAGNOSIS — Z1231 Encounter for screening mammogram for malignant neoplasm of breast: Secondary | ICD-10-CM | POA: Diagnosis not present

## 2017-11-01 ENCOUNTER — Encounter: Payer: Self-pay | Admitting: Family Medicine

## 2017-11-01 ENCOUNTER — Ambulatory Visit: Payer: Medicare HMO | Attending: Family Medicine | Admitting: Family Medicine

## 2017-11-01 ENCOUNTER — Other Ambulatory Visit: Payer: Self-pay

## 2017-11-01 VITALS — BP 146/85 | HR 69 | Temp 97.5°F | Ht 62.0 in | Wt 174.6 lb

## 2017-11-01 DIAGNOSIS — Z888 Allergy status to other drugs, medicaments and biological substances status: Secondary | ICD-10-CM | POA: Diagnosis not present

## 2017-11-01 DIAGNOSIS — Z79899 Other long term (current) drug therapy: Secondary | ICD-10-CM | POA: Insufficient documentation

## 2017-11-01 DIAGNOSIS — G4700 Insomnia, unspecified: Secondary | ICD-10-CM | POA: Diagnosis not present

## 2017-11-01 DIAGNOSIS — Z7982 Long term (current) use of aspirin: Secondary | ICD-10-CM | POA: Diagnosis not present

## 2017-11-01 DIAGNOSIS — G8929 Other chronic pain: Secondary | ICD-10-CM | POA: Diagnosis not present

## 2017-11-01 DIAGNOSIS — Z9889 Other specified postprocedural states: Secondary | ICD-10-CM | POA: Diagnosis not present

## 2017-11-01 DIAGNOSIS — M5412 Radiculopathy, cervical region: Secondary | ICD-10-CM | POA: Insufficient documentation

## 2017-11-01 DIAGNOSIS — E11621 Type 2 diabetes mellitus with foot ulcer: Secondary | ICD-10-CM | POA: Insufficient documentation

## 2017-11-01 DIAGNOSIS — E7800 Pure hypercholesterolemia, unspecified: Secondary | ICD-10-CM | POA: Insufficient documentation

## 2017-11-01 DIAGNOSIS — L97509 Non-pressure chronic ulcer of other part of unspecified foot with unspecified severity: Secondary | ICD-10-CM | POA: Diagnosis not present

## 2017-11-01 DIAGNOSIS — I10 Essential (primary) hypertension: Secondary | ICD-10-CM | POA: Insufficient documentation

## 2017-11-01 DIAGNOSIS — Z90710 Acquired absence of both cervix and uterus: Secondary | ICD-10-CM | POA: Insufficient documentation

## 2017-11-01 DIAGNOSIS — Z960 Presence of urogenital implants: Secondary | ICD-10-CM | POA: Insufficient documentation

## 2017-11-01 DIAGNOSIS — M5010 Cervical disc disorder with radiculopathy, unspecified cervical region: Secondary | ICD-10-CM

## 2017-11-01 DIAGNOSIS — M2550 Pain in unspecified joint: Secondary | ICD-10-CM

## 2017-11-01 DIAGNOSIS — M940 Chondrocostal junction syndrome [Tietze]: Secondary | ICD-10-CM | POA: Diagnosis not present

## 2017-11-01 DIAGNOSIS — Z7984 Long term (current) use of oral hypoglycemic drugs: Secondary | ICD-10-CM | POA: Insufficient documentation

## 2017-11-01 DIAGNOSIS — M25512 Pain in left shoulder: Secondary | ICD-10-CM | POA: Diagnosis not present

## 2017-11-01 LAB — GLUCOSE, POCT (MANUAL RESULT ENTRY): POC Glucose: 136 mg/dl — AB (ref 70–99)

## 2017-11-01 MED ORDER — GABAPENTIN 300 MG PO CAPS: 300.0000 mg | ORAL_CAPSULE | Freq: Every day | ORAL | 3 refills | Status: DC

## 2017-11-01 MED ORDER — PREDNISONE 20 MG PO TABS
20.0000 mg | ORAL_TABLET | Freq: Every day | ORAL | 0 refills | Status: DC
Start: 2017-11-01 — End: 2018-03-15

## 2017-11-01 NOTE — Progress Notes (Signed)
Subjective:  Patient ID: Jill Anderson, female    DOB: [birth year]  Age: 52 y.o. MRN: [id number]  CC: Diabetes and Temporomandibular Joint Pain   HPI Jill Anderson is a 52 year old female with a history of Type 2 Diabetes Mellitus (A1c 6.6), Hypercholesterolemia, chronic neck and chronic low back pain secondary to degenerative changes of the spine, Insomnia who presents today accompanied by her spouse for a follow up visit. She was seen by the PA 6 weeks ago for left shoulder pain which she states is chronic but worsened over the last 2 months with severe limitation in range of motion especially with stretching her arm.  X-ray of the left shoulder was unrevealing.  She also has chronic neck pain and endorses shooting pains and numbness in her left upper extremities.  She has also noticed intermittent sharp chest pains which occur at rest and does not radiate.  She has also began to hurt in her joints especially the lateral aspects of both elbows, her knees but she denies swelling of her joints. She uses tramadol but does not want to get addicted to it. She was referred to Ortho and sports medicine however she never went for that appointment because she was not satisfied with her care at the PA visit and  wanted to find out what was wrong with her shoulder rather than being referred for surgery.  Her diabetes is controlled and she denies hypoglycemia, visual concerns and she is doing well on her antihypertensive.  Unable to use statin due to myalgias and takes red yeast rice with some improvement in her cholesterol.   Past Medical History:  Diagnosis Date  . Borderline diabetic   . Diabetes mellitus without complication [facility])     Past Surgical History:  Procedure Laterality Date  . ABDOMINAL HYSTERECTOMY    . BACK SURGERY    . CYSTOSCOPY WITH RETROGRADE PYELOGRAM, URETEROSCOPY AND STENT PLACEMENT Left 10/28/2015   Procedure: CYSTOSCOPY WITH RETROGRADE PYELOGRAM, URETEROSCOPY AND STENT  PLACEMENT, AND LASER;  Surgeon: Irine Seal, MD;  Location: [facility];  Service: Urology;  Laterality: Left;    Allergies  Allergen Reactions  . Atorvastatin Other (See Comments)    myalgia     Outpatient Medications Prior to Visit  Medication Sig Dispense Refill  . aspirin EC 81 MG tablet Take 1 tablet (81 mg total) by mouth daily. 30 tablet 11  . cetirizine (ZYRTEC) 10 MG tablet Take 1 tablet (10 mg total) by mouth daily. 30 tablet 1  . Elastic Bandages & Supports (TRUFORM ARM 43 M 15-20MMHG) MISC 1 each by Does not apply route daily. 1 each 0  . ergocalciferol (DRISDOL) 50000 units capsule Take 1 capsule (50,000 Units total) by mouth once a week. 9 capsule 1  . ibuprofen (ADVIL,MOTRIN) 800 MG tablet Take 1 tablet (800 mg total) by mouth every 12 (twelve) hours as needed. 60 tablet 2  . metFORMIN (GLUCOPHAGE) 500 MG tablet Take 2 tablets (1,000 mg total) by mouth 2 (two) times daily with a meal. 120 tablet 6  . methocarbamol (ROBAXIN) 500 MG tablet Take 1 tablet (500 mg total) by mouth every 8 (eight) hours as needed for muscle spasms. 90 tablet 2  . Misc. Devices (CANE) MISC 1 each by Does not apply route as needed. 1 each 0  . psyllium (METAMUCIL) 58.6 % powder Take 1 packet by mouth 2 (two) times daily.    . traMADol (ULTRAM) 50 MG tablet Take 1 tablet (50 mg total) by mouth  every 6 (six) hours as needed for moderate pain. 60 tablet 2  . traZODone (DESYREL) 100 MG tablet Take 1 tablet (100 mg total) by mouth at bedtime as needed for sleep. 30 tablet 3  . meloxicam (MOBIC) 7.5 MG tablet Take 1 tablet (7.5 mg total) by mouth daily. 30 tablet 0  . ezetimibe (ZETIA) 10 MG tablet Take 1 tablet (10 mg total) by mouth daily. (Patient not taking: Reported on 11/01/2017) 30 tablet 3   No facility-administered medications prior to visit.     ROS Review of Systems  Constitutional: Negative for activity change, appetite change and fatigue.  HENT: Negative for congestion, sinus pressure and  sore throat.   Eyes: Negative for visual disturbance.  Respiratory: Negative for cough, chest tightness, shortness of breath and wheezing.   Cardiovascular: Negative for chest pain and palpitations.  Gastrointestinal: Negative for abdominal distention, abdominal pain and constipation.  Endocrine: Negative for polydipsia.  Genitourinary: Negative for dysuria and frequency.  Musculoskeletal: Positive for back pain. Negative for arthralgias.  Skin: Negative for rash.  Neurological: Negative for tremors, light-headedness and numbness.  Hematological: Does not bruise/bleed easily.  Psychiatric/Behavioral: Negative for agitation and behavioral problems.    Objective:  BP (!) 146/85   Pulse 69   Temp (!) 97.5 F (36.4 C) (Oral)   Ht _0  (1.575 m)   Wt 174 lb 9.6 oz (79.2 kg)   SpO2 97%   BMI 31.93 kg/m   BP/Weight 11/01/2017 09/08/2017 12/09/6267  Systolic BP 485 462 703  Diastolic BP 85 83 83  Wt. (Lbs) 174.6 175.4 175  BMI 31.93 32.08 32.01      Physical Exam  Constitutional: She is oriented to person, place, and time. She appears well-developed and well-nourished.  Cardiovascular: Normal rate, normal heart sounds and intact distal pulses.  No murmur heard. Pulmonary/Chest: Effort normal and breath sounds normal. She has no wheezes. She has no rales. She exhibits tenderness (TTP of left chest wall).  Abdominal: Soft. Bowel sounds are normal. She exhibits no distension and no mass. There is no tenderness.  Musculoskeletal: Normal range of motion.  Entire lumbar spine tenderness with positive straight leg raise bilaterally Posterior cervical tenderness, positive Hawkins sign. Abduction of left upper extremity limited to 45 degrees.  Neurological: She is alert and oriented to person, place, and time.  Motor strength: Right upper extremity-5/5 Left upper extremity-3+/5 Lower extremity-5/5 bilaterally  Skin: Skin is warm and dry.  Psychiatric: She has a normal mood and affect.      Lab Results  Component Value Date   HGBA1C 6.6 08/12/2017     Assessment & Plan:   1. Type 2 diabetes mellitus with foot ulcer, without long-term current use of insulin (HCC) Controlled with A1c of 6.6 Diabetic diet, lifestyle modifications - POCT glucose (manual entry) - CMP14+EGFR - Lipid panel  2. Cervical disc disorder with radiculopathy of cervical region Uncontrolled; shoulder pain could be an extension of her cervical radiculopathy - Ambulatory referral to Physical Therapy - predniSONE (DELTASONE) 20 MG tablet; Take 1 tablet (20 mg total) by mouth daily with breakfast.  Dispense: 5 tablet; Refill: 0 - gabapentin (NEURONTIN) 300 MG capsule; Take 1 capsule (300 mg total) by mouth at bedtime.  Dispense: 30 capsule; Refill: 3  3. Chronic left shoulder pain Likely shoulder impingement syndrome - Ambulatory referral to Physical Therapy - MR Shoulder Left Wo Contrast; Future - predniSONE (DELTASONE) 20 MG tablet; Take 1 tablet (20 mg total) by mouth daily with breakfast.  Dispense: 5 tablet; Refill: 0  4. Hypertension, unspecified type Slightly elevated No regimen change today as this could be due to pain Continue antihypertensive, low-sodium diet  5. Costochondritis EKG unrevealing Continue with anti-inflammatory  6. Arthralgia, unspecified joint - ANA - Rheumatoid factor - Cyclic Citrul Peptide Antibody, IGG - RA Qn+CCP(IgG/A)+SjoSSA+SjoSSB - Anti-Smith antibody - Anti-DNA antibody, double-stranded   Meds ordered this encounter  Medications  . predniSONE (DELTASONE) 20 MG tablet    Sig: Take 1 tablet (20 mg total) by mouth daily with breakfast.    Dispense:  5 tablet    Refill:  0  . gabapentin (NEURONTIN) 300 MG capsule    Sig: Take 1 capsule (300 mg total) by mouth at bedtime.    Dispense:  30 capsule    Refill:  3    Follow-up: Return in about 6 weeks (around 12/13/2017) for Follow-up of left shoulder pain.   Charlott Rakes MD

## 2017-11-01 NOTE — Patient Instructions (Signed)
Shoulder Impingement Syndrome Shoulder impingement syndrome is a condition that causes pain when connective tissues (tendons) surrounding the shoulder joint become pinched. These tendons are part of the group of muscles and tissues that help to stabilize the shoulder (rotator cuff). Beneath the rotator cuff is a fluid-filled sac (bursa) that allows the muscles and tendons to glide smoothly. The bursa may become swollen or irritated (bursitis). Bursitis, swelling in the rotator cuff tendons, or both conditions can decrease how much space is under a bone in the shoulder joint (acromion), resulting in impingement. What are the causes? Shoulder impingement syndrome can be caused by bursitis or swelling of the rotator cuff tendons, which may result from:  Repetitive overhead arm movements.  Falling onto the shoulder.  Weakness in the shoulder muscles.  What increases the risk? You may be more likely to develop this condition if you are an athlete who participates in:  Sports that involve throwing, such as baseball.  Tennis.  Swimming.  Volleyball.  Some people are also more likely to develop impingement syndrome because of the shape of their acromion bone. What are the signs or symptoms? The main symptom of this condition is pain on the front or side of the shoulder. Pain may:  Get worse when lifting or raising the arm.  Get worse at night.  Wake you up from sleeping.  Feel sharp when the shoulder is moved, and then fade to an ache.  Other signs and symptoms may include:  Tenderness.  Stiffness.  Inability to raise the arm above shoulder level or behind the body.  Weakness.  How is this diagnosed? This condition may be diagnosed based on:  Your symptoms.  Your medical history.  A physical exam.  Imaging tests, such as: ? X-rays. ? MRI. ? Ultrasound.  How is this treated? Treatment for this condition may include:  Resting your shoulder and avoiding all  activities that cause pain or put stress on the shoulder.  Icing your shoulder.  NSAIDs to help reduce pain and swelling.  One or more injections of medicines to numb the area and reduce inflammation.  Physical therapy.  Surgery. This may be needed if nonsurgical treatments have not helped. Surgery may involve repairing the rotator cuff, reshaping the acromion, or removing the bursa.  Follow these instructions at home: Managing pain, stiffness, and swelling  If directed, apply ice to the injured area. ? Put ice in a plastic bag. ? Place a towel between your skin and the bag. ? Leave the ice on for 20 minutes, 2-3 times a day. Activity  Rest and return to your normal activities as told by your health care provider. Ask your health care provider what activities are safe for you.  Do exercises as told by your health care provider. General instructions  Do not use any tobacco products, including cigarettes, chewing tobacco, or e-cigarettes. Tobacco can delay healing. If you need help quitting, ask your health care provider.  Ask your health care provider when it is safe for you to drive.  Take over-the-counter and prescription medicines only as told by your health care provider.  Keep all follow-up visits as told by your health care provider. This is important. How is this prevented?  Give your body time to rest between periods of activity.  Be safe and responsible while being active to avoid falls.  Maintain physical fitness, including strength and flexibility. Contact a health care provider if:  Your symptoms have not improved after 1-2 months of treatment and   rest.  You cannot lift your arm away from your body. This information is not intended to replace advice given to you by your health care provider. Make sure you discuss any questions you have with your health care provider. Document Released: 05/18/2005 Document Revised: 01/23/2016 Document Reviewed:  04/20/2015 Elsevier Interactive Patient Education  2018 [employer]  

## 2017-11-01 NOTE — Progress Notes (Signed)
Patient wants all lab work done today.

## 2017-11-03 LAB — CMP14+EGFR
ALK PHOS: 58 IU/L (ref 39–117)
ALT: 25 IU/L (ref 0–32)
AST: 22 IU/L (ref 0–40)
Albumin/Globulin Ratio: 1.5 (ref 1.2–2.2)
Albumin: 4.5 g/dL (ref 3.5–5.5)
BUN/Creatinine Ratio: 13 (ref 9–23)
BUN: 10 mg/dL (ref 6–24)
Bilirubin Total: 1 mg/dL (ref 0.0–1.2)
CHLORIDE: 100 mmol/L (ref 96–106)
CO2: 24 mmol/L (ref 20–29)
CREATININE: 0.75 mg/dL (ref 0.57–1.00)
Calcium: 9.6 mg/dL (ref 8.7–10.2)
GFR calc Af Amer: 105 mL/min/{1.73_m2} (ref >59)
GFR calc non Af Amer: 91 mL/min/{1.73_m2} (ref >59)
GLOBULIN, TOTAL: 3.1 g/dL (ref 1.5–4.5)
GLUCOSE: 110 mg/dL — AB (ref 65–99)
Potassium: 4.7 mmol/L (ref 3.5–5.2)
SODIUM: 137 mmol/L (ref 134–144)
Total Protein: 7.6 g/dL (ref 6.0–8.5)

## 2017-11-03 LAB — LIPID PANEL
CHOLESTEROL TOTAL: 203 mg/dL — AB (ref 100–199)
Chol/HDL Ratio: 4.1 ratio (ref 0.0–4.4)
HDL: 49 mg/dL (ref >39)
LDL Calculated: 136 mg/dL — ABNORMAL HIGH (ref 0–99)
Triglycerides: 91 mg/dL (ref 0–149)
VLDL CHOLESTEROL CAL: 18 mg/dL (ref 5–40)

## 2017-11-03 LAB — ANTI-DNA ANTIBODY, DOUBLE-STRANDED

## 2017-11-03 LAB — RA QN+CCP(IGG/A)+SJOSSA+SJOSSB
CYCLIC CITRULLIN PEPTIDE AB: 10 U (ref 0–19)
ENA SSA (RO) Ab: <0.2 AI (ref 0.0–0.9)
Rhuematoid fact SerPl-aCnc: <10 IU/mL (ref 0.0–13.9)

## 2017-11-03 LAB — ANTI-SMITH ANTIBODY: ENA SM AB SER-ACNC: 0.2 AI (ref 0.0–0.9)

## 2017-11-03 LAB — ANA: Anti Nuclear Antibody(ANA): NEGATIVE

## 2017-11-04 ENCOUNTER — Encounter: Payer: Self-pay | Admitting: Physical Therapy

## 2017-11-04 ENCOUNTER — Other Ambulatory Visit: Payer: Self-pay

## 2017-11-04 ENCOUNTER — Ambulatory Visit: Payer: Medicare HMO | Attending: Family Medicine | Admitting: Physical Therapy

## 2017-11-04 DIAGNOSIS — M542 Cervicalgia: Secondary | ICD-10-CM | POA: Insufficient documentation

## 2017-11-04 DIAGNOSIS — G8929 Other chronic pain: Secondary | ICD-10-CM | POA: Insufficient documentation

## 2017-11-04 DIAGNOSIS — M6281 Muscle weakness (generalized): Secondary | ICD-10-CM | POA: Diagnosis not present

## 2017-11-04 DIAGNOSIS — M25612 Stiffness of left shoulder, not elsewhere classified: Secondary | ICD-10-CM | POA: Diagnosis not present

## 2017-11-04 DIAGNOSIS — M25512 Pain in left shoulder: Secondary | ICD-10-CM | POA: Diagnosis not present

## 2017-11-04 NOTE — Patient Instructions (Signed)
   SCAPULAR RETRACTIONS  Draw your shoulder blades back and down.  It should feel like you are squeezing your shoulder blades together.  Repeat 10 times, twice a day.     SHOULDER ROLLS  Move your shoulders in a circular pattern as shown so that your are moving in an up, back and down direction. Perform small cicles if needed for comfort.  It should feel like an up, back, down motion.   Repeat 10-15 times, twice a day.     Front to Back Shoulder Pendulum Exercises   Start by resting your non-affected hand on a stable object for balance. Slightly bend forward so that your body is at a 90 degree angle, with your affected arm hanging in front of body towards the floor. Place foot of affected side one step forward compared to foot of non-affected side. While upper body is still bent at a 90 degree angle and affected arm is hanging freely in front of you, slowly start to shift your body weight forward then back on your lower extremities, creating a rocking motion, to allow your injured arm to swing forward and back freely. Your affected arm should be fully relaxed when doing this exercise without the use of shoulder muscles to create the movement.   Repeat 10 times, twice a day.     Side to Side Shoulder Pendulum Exercise  When completing the side to side pendulum motion, you will position legs side by side and shoulder length apart with body bent at a 90 degree angle and your affected arm hanging in front of body towards the floor. For this exercise you will shift your body weight side to side to allow your injured arm to swing side to side freely. Your injured arm should be fully relaxed and should move slowly with the movement of your body.  Repeat 10 times, twice a day.
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
## 2017-11-09 ENCOUNTER — Telehealth: Payer: Self-pay | Admitting: Family Medicine

## 2017-11-09 NOTE — Telephone Encounter (Signed)
Kylie from the pre service center called stating that the patient is having an MRI done tomorrow and it needs prior auth. Patient has [employer]. Please f/u

## 2017-11-09 NOTE — Telephone Encounter (Signed)
[facility]

## 2017-11-09 NOTE — Telephone Encounter (Signed)
Tracking number for patient MRI  Prior auth for MRI was approved. Tracking number is [id number]. [employer] is having problems uploading information, unable to fax verification but noted in the system.

## 2017-11-10 ENCOUNTER — Ambulatory Visit (HOSPITAL_COMMUNITY)
Admission: RE | Admit: 2017-11-10 | Discharge: 2017-11-10 | Disposition: A | Discharge status: Home / Self Care | Payer: Medicare HMO | Source: Ambulatory Visit | Attending: Family Medicine | Admitting: Family Medicine

## 2017-11-10 DIAGNOSIS — M7582 Other shoulder lesions, left shoulder: Secondary | ICD-10-CM | POA: Insufficient documentation

## 2017-11-10 DIAGNOSIS — G8929 Other chronic pain: Secondary | ICD-10-CM | POA: Diagnosis not present

## 2017-11-10 DIAGNOSIS — M25512 Pain in left shoulder: Secondary | ICD-10-CM | POA: Insufficient documentation

## 2017-11-16 ENCOUNTER — Ambulatory Visit: Payer: Medicare Other | Admitting: Family Medicine

## 2017-12-03 DIAGNOSIS — E119 Type 2 diabetes mellitus without complications: Secondary | ICD-10-CM | POA: Diagnosis not present

## 2017-12-03 DIAGNOSIS — I6521 Occlusion and stenosis of right carotid artery: Secondary | ICD-10-CM | POA: Diagnosis not present

## 2017-12-03 DIAGNOSIS — R51 Headache: Secondary | ICD-10-CM | POA: Diagnosis not present

## 2017-12-03 DIAGNOSIS — Z7984 Long term (current) use of oral hypoglycemic drugs: Secondary | ICD-10-CM | POA: Diagnosis not present

## 2017-12-03 DIAGNOSIS — R079 Chest pain, unspecified: Secondary | ICD-10-CM | POA: Diagnosis not present

## 2017-12-13 ENCOUNTER — Ambulatory Visit: Payer: Medicare HMO | Attending: Family Medicine | Admitting: Family Medicine

## 2017-12-13 ENCOUNTER — Encounter: Payer: Self-pay | Admitting: Family Medicine

## 2017-12-13 ENCOUNTER — Other Ambulatory Visit (HOSPITAL_COMMUNITY)
Admission: RE | Admit: 2017-12-13 | Discharge: 2017-12-13 | Disposition: A | Discharge status: Home / Self Care | Payer: Medicare HMO | Source: Ambulatory Visit | Attending: Family Medicine | Admitting: Family Medicine

## 2017-12-13 VITALS — BP 136/86 | HR 65 | Temp 98.2°F | Resp 18 | Ht 62.0 in | Wt 173.0 lb

## 2017-12-13 DIAGNOSIS — Z7984 Long term (current) use of oral hypoglycemic drugs: Secondary | ICD-10-CM | POA: Diagnosis not present

## 2017-12-13 DIAGNOSIS — G4700 Insomnia, unspecified: Secondary | ICD-10-CM | POA: Diagnosis not present

## 2017-12-13 DIAGNOSIS — M5412 Radiculopathy, cervical region: Secondary | ICD-10-CM | POA: Diagnosis not present

## 2017-12-13 DIAGNOSIS — M5010 Cervical disc disorder with radiculopathy, unspecified cervical region: Secondary | ICD-10-CM | POA: Diagnosis not present

## 2017-12-13 DIAGNOSIS — M7542 Impingement syndrome of left shoulder: Secondary | ICD-10-CM | POA: Diagnosis not present

## 2017-12-13 DIAGNOSIS — E1165 Type 2 diabetes mellitus with hyperglycemia: Secondary | ICD-10-CM | POA: Insufficient documentation

## 2017-12-13 DIAGNOSIS — E1169 Type 2 diabetes mellitus with other specified complication: Secondary | ICD-10-CM

## 2017-12-13 DIAGNOSIS — E785 Hyperlipidemia, unspecified: Secondary | ICD-10-CM | POA: Diagnosis not present

## 2017-12-13 DIAGNOSIS — Z113 Encounter for screening for infections with a predominantly sexual mode of transmission: Secondary | ICD-10-CM | POA: Insufficient documentation

## 2017-12-13 DIAGNOSIS — G8929 Other chronic pain: Secondary | ICD-10-CM | POA: Insufficient documentation

## 2017-12-13 DIAGNOSIS — E7800 Pure hypercholesterolemia, unspecified: Secondary | ICD-10-CM | POA: Diagnosis not present

## 2017-12-13 DIAGNOSIS — Z79899 Other long term (current) drug therapy: Secondary | ICD-10-CM | POA: Insufficient documentation

## 2017-12-13 DIAGNOSIS — G4709 Other insomnia: Secondary | ICD-10-CM

## 2017-12-13 DIAGNOSIS — M5416 Radiculopathy, lumbar region: Secondary | ICD-10-CM | POA: Diagnosis not present

## 2017-12-13 DIAGNOSIS — Z7982 Long term (current) use of aspirin: Secondary | ICD-10-CM | POA: Insufficient documentation

## 2017-12-13 LAB — GLUCOSE, POCT (MANUAL RESULT ENTRY): POC GLUCOSE: 103 mg/dL — AB (ref 70–99)

## 2017-12-13 MED ORDER — CETIRIZINE HCL 10 MG PO TABS: 10.0000 mg | ORAL_TABLET | Freq: Every day | ORAL | 1 refills | Status: DC

## 2017-12-13 MED ORDER — METHOCARBAMOL 500 MG PO TABS: 500.0000 mg | ORAL_TABLET | Freq: Three times a day (TID) | ORAL | 2 refills | Status: DC | PRN

## 2017-12-13 MED ORDER — TRAZODONE HCL 100 MG PO TABS: 100.0000 mg | ORAL_TABLET | Freq: Every evening | ORAL | 3 refills | Status: DC | PRN

## 2017-12-13 MED ORDER — EZETIMIBE 10 MG PO TABS: 10.0000 mg | ORAL_TABLET | Freq: Every day | ORAL | 3 refills | Status: DC

## 2017-12-13 NOTE — Patient Instructions (Signed)
Cervical Radiculopathy  Cervical radiculopathy means that a nerve in the neck is pinched or bruised. This can cause pain or loss of feeling (numbness) that runs from your neck to your arm and fingers.  Follow these instructions at home:  Managing pain  ? Take over-the-counter and prescription medicines only as told by your doctor.  ? If directed, put ice on the injured or painful area.  ? Put ice in a plastic bag.  ? Place a towel between your skin and the bag.  ? Leave the ice on for 20 minutes, 2?3 times per day.  ? If ice does not help, you can try using heat. Take a warm shower or warm bath, or use a heat pack as told by your doctor.  ? You may try a gentle neck and shoulder massage.  Activity  ? Rest as needed. Follow instructions from your doctor about any activities to avoid.  ? Do exercises as told by your doctor or physical therapist.  General instructions  ? If you were given a soft collar, wear it as told by your doctor.  ? Use a flat pillow when you sleep.  ? Keep all follow-up visits as told by your doctor. This is important.  Contact a doctor if:  ? Your condition does not improve with treatment.  Get help right away if:  ? Your pain gets worse and is not controlled with medicine.  ? You lose feeling or feel weak in your hand, arm, face, or leg.  ? You have a fever.  ? You have a stiff neck.  ? You cannot control when you poop or pee (have incontinence).  ? You have trouble with walking, balance, or talking.  This information is not intended to replace advice given to you by your health care provider. Make sure you discuss any questions you have with your health care provider.  Document Released: 05/07/2011 Document Revised: 10/24/2015 Document Reviewed: 07/12/2014  [employer] Interactive Patient Education ? 2018 [employer]

## 2017-12-13 NOTE — Progress Notes (Signed)
Subjective:  Patient ID: Jill Anderson, female    DOB: [birth year]  Age: 52 y.o. MRN: [id number]  CC: Follow-up   HPI Jill Anderson is a 52 year old female with a history of Type 2 Diabetes Mellitus (A1c 6.6), Hypercholesterolemia, chronic neck and chronic low back pain secondary to degenerative changes of the spine, Insomnia who presents today for follow-up visit. She had been referred to physical therapy due to cervical radiculopathy and left shoulder impingement syndrome and has had one physical therapy sessions so far due to her traveling out of town but plans to call and reschedule her subsequent appointments.  Reports improvement in left shoulder pain which is rated as a 5/10 at this time and she has been performing some home exercises. Strength in left upper extremity is decreased with associated dropping of things from her left hand. Her low back pain is stable and she alternates between tramadol and ibuprofen for her symptoms. She is not on a statin due to statin myopathy but has been taking red yeast rice; I had placed her on Zetia which she has not been taking.   She returned from a trip to the [place] and on her way back had a sinus infection which worsened during her flight.  She was seen at a [facility] and placed on Flonase but when she got to [facility] she took some leftover amoxicillin capsules which resulted in diarrhea but her symptoms have resolved at this time. She is requesting STD screening.  Past Medical History:  Diagnosis Date  . Borderline diabetic   . Diabetes mellitus without complication [facility])     Past Surgical History:  Procedure Laterality Date  . ABDOMINAL HYSTERECTOMY    . BACK SURGERY    . CYSTOSCOPY WITH RETROGRADE PYELOGRAM, URETEROSCOPY AND STENT PLACEMENT Left 10/28/2015   Procedure: CYSTOSCOPY WITH RETROGRADE PYELOGRAM, URETEROSCOPY AND STENT PLACEMENT, AND LASER;  Surgeon: Bjorn Pippin, MD;  Location: [facility];  Service: Urology;   Laterality: Left;    Allergies  Allergen Reactions  . Atorvastatin Other (See Comments)    myalgia     Outpatient Medications Prior to Visit  Medication Sig Dispense Refill  . aspirin EC 81 MG tablet Take 1 tablet (81 mg total) by mouth daily. 30 tablet 11  . Elastic Bandages & Supports (TRUFORM ARM SLEEVE M 15-20MMHG) MISC 1 each by Does not apply route daily. 1 each 0  . ergocalciferol (DRISDOL) 50000 units capsule Take 1 capsule (50,000 Units total) by mouth once a week. 9 capsule 1  . gabapentin (NEURONTIN) 300 MG capsule Take 1 capsule (300 mg total) by mouth at bedtime. 30 capsule 3  . ibuprofen (ADVIL,MOTRIN) 800 MG tablet Take 1 tablet (800 mg total) by mouth every 12 (twelve) hours as needed. 60 tablet 2  . metFORMIN (GLUCOPHAGE) 500 MG tablet Take 2 tablets (1,000 mg total) by mouth 2 (two) times daily with a meal. 120 tablet 6  . Misc. Devices (CANE) MISC 1 each by Does not apply route as needed. 1 each 0  . predniSONE (DELTASONE) 20 MG tablet Take 1 tablet (20 mg total) by mouth daily with breakfast. 5 tablet 0  . psyllium (METAMUCIL) 58.6 % powder Take 1 packet by mouth 2 (two) times daily.    . traMADol (ULTRAM) 50 MG tablet Take 1 tablet (50 mg total) by mouth every 6 (six) hours as needed for moderate pain. 60 tablet 2  . cetirizine (ZYRTEC) 10 MG tablet Take 1 tablet (10 mg total)  by mouth daily. 30 tablet 1  . methocarbamol (ROBAXIN) 500 MG tablet Take 1 tablet (500 mg total) by mouth every 8 (eight) hours as needed for muscle spasms. 90 tablet 2  . traZODone (DESYREL) 100 MG tablet Take 1 tablet (100 mg total) by mouth at bedtime as needed for sleep. 30 tablet 3  . ezetimibe (ZETIA) 10 MG tablet Take 1 tablet (10 mg total) by mouth daily. (Patient not taking: Reported on 11/01/2017) 30 tablet 3   No facility-administered medications prior to visit.     ROS Review of Systems  Constitutional: Negative for activity change, appetite change and fatigue.  HENT: Negative  for congestion, sinus pressure and sore throat.   Eyes: Negative for visual disturbance.  Respiratory: Negative for cough, chest tightness, shortness of breath and wheezing.   Cardiovascular: Negative for chest pain and palpitations.  Gastrointestinal: Negative for abdominal distention, abdominal pain and constipation.  Endocrine: Negative for polydipsia.  Genitourinary: Negative for dysuria and frequency.  Musculoskeletal: Positive for gait problem and neck pain. Negative for arthralgias and back pain.  Skin: Negative for rash.  Neurological: Positive for numbness. Negative for tremors and light-headedness.  Hematological: Does not bruise/bleed easily.  Psychiatric/Behavioral: Negative for agitation and behavioral problems.    Objective:  BP 136/86 (BP Location: Left Arm, Patient Position: Sitting, Cuff Size: Normal)   Pulse 65   Temp 98.2 F (36.8 C) (Oral)   Resp 18   Ht 5\' 2"  (1.575 m)   Wt 173 lb (78.5 kg)   SpO2 99%   BMI 31.64 kg/m   BP/Weight 12/13/2017 11/01/2017 09/08/2017  Systolic BP 136 146 134  Diastolic BP 86 85 83  Wt. (Lbs) 173 174.6 175.4  BMI 31.64 31.93 32.08      Physical Exam  Constitutional: She is oriented to person, place, and time. She appears well-developed and well-nourished.  Cardiovascular: Normal rate, normal heart sounds and intact distal pulses.  No murmur heard. Pulmonary/Chest: Effort normal and breath sounds normal. She has no wheezes. She has no rales. She exhibits no tenderness.  Abdominal: Soft. Bowel sounds are normal. She exhibits no distension and no mass. There is no tenderness.  Musculoskeletal: Normal range of motion.  Neurological: She is alert and oriented to person, place, and time.  Motor strength: Left upper extremity-4/5 Right upper extremity-5/5 Bilateral lower extremity-5/5  Skin: Skin is warm and dry.  Psychiatric: She has a normal mood and affect.      CMP Latest Ref Rng & Units 11/01/2017 05/14/2017 12/14/2016    Glucose 65 - 99 mg/dL 409(W) 119(J) 84  BUN 6 - 24 mg/dL 10 11 10   Creatinine 0.57 - 1.00 mg/dL 4.78 2.95 6.21  Sodium 134 - 144 mmol/L 137 140 139  Potassium 3.5 - 5.2 mmol/L 4.7 4.6 4.5  Chloride 96 - 106 mmol/L 100 103 100  CO2 20 - 29 mmol/L 24 23 20   Calcium 8.7 - 10.2 mg/dL 9.6 9.7 9.8  Total Protein 6.0 - 8.5 g/dL 7.6 7.5 7.5  Total Bilirubin 0.0 - 1.2 mg/dL 1.0 0.9 1.2  Alkaline Phos 39 - 117 IU/L 58 54 56  AST 0 - 40 IU/L 22 23 23   ALT 0 - 32 IU/L 25 29 22     Lipid Panel     Component Value Date/Time   CHOL 203 (H) 11/01/2017 1139   TRIG 91 11/01/2017 1139   HDL 49 11/01/2017 1139   CHOLHDL 4.1 11/01/2017 1139   CHOLHDL 4.8 07/12/2015 1008   VLDL  14 07/12/2015 1008   LDLCALC 136 (H) 11/01/2017 1139    Lab Results  Component Value Date   HGBA1C 6.6 08/12/2017    Assessment & Plan:   1. Type 2 diabetes mellitus with hyperglycemia, without long term insulin use (HCC) Controlled with A1c of 6.6 Continue current regimen Counseled on Diabetic diet, my plate method, 454150 minutes of moderate intensity exercise/week Keep blood sugar logs with fasting goals of 80-120 mg/dl, random of less than 098180 and in the event of sugars less than 60 mg/dl or greater than 119400 mg/dl please notify the clinic ASAP. It is recommended that you undergo annual eye exams and annual foot exams. Pneumonia vaccine is recommended. - Glucose (CBG) - Hemoglobin A1c  2. Cervical radiculopathy Improved Advised to continue with PT sessions  3. Chronic radicular low back pain Controlled on tramadol which she uses intermittently  4. Cervical disc disorder with radiculopathy of cervical region - methocarbamol (ROBAXIN) 500 MG tablet; Take 1 tablet (500 mg total) by mouth every 8 (eight) hours as needed for muscle spasms.  Dispense: 90 tablet; Refill: 2  5. Other insomnia Controlled - traZODone (DESYREL) 100 MG tablet; Take 1 tablet (100 mg total) by mouth at bedtime as needed for sleep.   Dispense: 30 tablet; Refill: 3  6. Screening for STD (sexually transmitted disease) - HIV antibody (with reflex) - Urine cytology ancillary only  7.  Hyperlipidemia Unable to tolerate statin due to myopathy Advised to comply with Zetia, fish oil capsules Meds ordered this encounter  Medications  . methocarbamol (ROBAXIN) 500 MG tablet    Sig: Take 1 tablet (500 mg total) by mouth every 8 (eight) hours as needed for muscle spasms.    Dispense:  90 tablet    Refill:  2  . traZODone (DESYREL) 100 MG tablet    Sig: Take 1 tablet (100 mg total) by mouth at bedtime as needed for sleep.    Dispense:  30 tablet    Refill:  3  . ezetimibe (ZETIA) 10 MG tablet    Sig: Take 1 tablet (10 mg total) by mouth daily.    Dispense:  30 tablet    Refill:  3    Discontinue atorvastatin  . cetirizine (ZYRTEC) 10 MG tablet    Sig: Take 1 tablet (10 mg total) by mouth daily.    Dispense:  30 tablet    Refill:  1    Follow-up: Return in about 3 months (around 03/15/2018) for follow up on diabetes mellitus.   Hoy RegisterEnobong Andrienne Havener MD

## 2017-12-14 LAB — URINE CYTOLOGY ANCILLARY ONLY
Chlamydia: NEGATIVE
NEISSERIA GONORRHEA: NEGATIVE
TRICH (WINDOWPATH): NEGATIVE

## 2017-12-14 LAB — HEMOGLOBIN A1C
ESTIMATED AVERAGE GLUCOSE: 134 mg/dL
Hgb A1c MFr Bld: 6.3 % — ABNORMAL HIGH (ref 4.8–5.6)

## 2017-12-14 LAB — HIV ANTIBODY (ROUTINE TESTING W REFLEX): HIV SCREEN 4TH GENERATION: NONREACTIVE

## 2017-12-16 LAB — URINE CYTOLOGY ANCILLARY ONLY
BACTERIAL VAGINITIS: NEGATIVE
Candida vaginitis: NEGATIVE

## 2018-01-11 ENCOUNTER — Encounter: Payer: Self-pay | Admitting: Physical Therapy

## 2018-01-11 NOTE — Therapy (Signed)
[place], [place], [address] Phone: [phone]   Fax:  [phone]  Patient Details  Name: Jill Anderson MRN: [id number] Date of Birth: [birth year] Referring Provider:  No ref. provider found  Encounter Date: 01/11/2018  PHYSICAL THERAPY DISCHARGE SUMMARY  Visits from Start of Care: 1  Current functional level related to goals / functional outcomes: Patient was evaluated then placed on hold due to travelling out of the country; she has not contacted this clinic to continue PT sessions, and thus will be discharged. She will require new MD order to continue with PT.    Remaining deficits: Unable to assess   Education / Equipment: Unable to assess Plan: Patient agrees to discharge.  Patient goals were not met. Patient is being discharged due to not returning since the last visit.  ?????     Deniece Ree PT, DPT, CBIS  Supplemental Physical Therapist [place]   Pager [facility] [address] [place], [place], [address] Phone: [phone]   Fax:  [phone]

## 2018-01-18 DIAGNOSIS — H52209 Unspecified astigmatism, unspecified eye: Secondary | ICD-10-CM | POA: Diagnosis not present

## 2018-01-18 DIAGNOSIS — H524 Presbyopia: Secondary | ICD-10-CM | POA: Diagnosis not present

## 2018-01-18 DIAGNOSIS — H5203 Hypermetropia, bilateral: Secondary | ICD-10-CM | POA: Diagnosis not present

## 2018-03-15 ENCOUNTER — Ambulatory Visit: Payer: Medicare HMO | Attending: Family Medicine | Admitting: Family Medicine

## 2018-03-15 ENCOUNTER — Encounter: Payer: Self-pay | Admitting: Family Medicine

## 2018-03-15 VITALS — BP 120/78 | HR 69 | Temp 97.7°F | Ht 62.0 in | Wt 173.6 lb

## 2018-03-15 DIAGNOSIS — E785 Hyperlipidemia, unspecified: Secondary | ICD-10-CM | POA: Diagnosis not present

## 2018-03-15 DIAGNOSIS — G4709 Other insomnia: Secondary | ICD-10-CM | POA: Diagnosis not present

## 2018-03-15 DIAGNOSIS — G8929 Other chronic pain: Secondary | ICD-10-CM | POA: Diagnosis not present

## 2018-03-15 DIAGNOSIS — G4700 Insomnia, unspecified: Secondary | ICD-10-CM | POA: Diagnosis not present

## 2018-03-15 DIAGNOSIS — Z7984 Long term (current) use of oral hypoglycemic drugs: Secondary | ICD-10-CM | POA: Insufficient documentation

## 2018-03-15 DIAGNOSIS — M5416 Radiculopathy, lumbar region: Secondary | ICD-10-CM | POA: Diagnosis not present

## 2018-03-15 DIAGNOSIS — M5010 Cervical disc disorder with radiculopathy, unspecified cervical region: Secondary | ICD-10-CM

## 2018-03-15 DIAGNOSIS — Z79899 Other long term (current) drug therapy: Secondary | ICD-10-CM | POA: Insufficient documentation

## 2018-03-15 DIAGNOSIS — E1169 Type 2 diabetes mellitus with other specified complication: Secondary | ICD-10-CM | POA: Insufficient documentation

## 2018-03-15 DIAGNOSIS — M25512 Pain in left shoulder: Secondary | ICD-10-CM | POA: Diagnosis not present

## 2018-03-15 DIAGNOSIS — E1165 Type 2 diabetes mellitus with hyperglycemia: Secondary | ICD-10-CM | POA: Insufficient documentation

## 2018-03-15 DIAGNOSIS — E7800 Pure hypercholesterolemia, unspecified: Secondary | ICD-10-CM | POA: Insufficient documentation

## 2018-03-15 DIAGNOSIS — M7711 Lateral epicondylitis, right elbow: Secondary | ICD-10-CM | POA: Insufficient documentation

## 2018-03-15 DIAGNOSIS — Z7982 Long term (current) use of aspirin: Secondary | ICD-10-CM | POA: Insufficient documentation

## 2018-03-15 DIAGNOSIS — R102 Pelvic and perineal pain: Secondary | ICD-10-CM | POA: Insufficient documentation

## 2018-03-15 DIAGNOSIS — M7712 Lateral epicondylitis, left elbow: Secondary | ICD-10-CM | POA: Insufficient documentation

## 2018-03-15 DIAGNOSIS — R309 Painful micturition, unspecified: Secondary | ICD-10-CM | POA: Diagnosis not present

## 2018-03-15 DIAGNOSIS — M545 Low back pain: Secondary | ICD-10-CM | POA: Insufficient documentation

## 2018-03-15 DIAGNOSIS — M5412 Radiculopathy, cervical region: Secondary | ICD-10-CM | POA: Insufficient documentation

## 2018-03-15 LAB — POCT URINALYSIS DIP (CLINITEK)
BILIRUBIN UA: NEGATIVE
BILIRUBIN UA: NEGATIVE mg/dL
Blood, UA: NEGATIVE
Glucose, UA: NEGATIVE mg/dL
LEUKOCYTES UA: NEGATIVE
Nitrite, UA: NEGATIVE
PROTEIN: NEGATIVE
Spec Grav, UA: >=1.03 — AB (ref 1.010–1.025)
Urobilinogen, UA: 0.2 E.U./dL
pH, UA: 5.5 (ref 5.0–8.0)

## 2018-03-15 LAB — POCT GLYCOSYLATED HEMOGLOBIN (HGB A1C): HbA1c, POC (controlled diabetic range): 6.5 % (ref 0.0–7.0)

## 2018-03-15 LAB — GLUCOSE, POCT (MANUAL RESULT ENTRY): POC GLUCOSE: 139 mg/dL — AB (ref 70–99)

## 2018-03-15 MED ORDER — METFORMIN HCL 500 MG PO TABS: 1000.0000 mg | ORAL_TABLET | Freq: Two times a day (BID) | ORAL | 6 refills | Status: DC

## 2018-03-15 MED ORDER — TRAZODONE HCL 100 MG PO TABS: 100.0000 mg | ORAL_TABLET | Freq: Every evening | ORAL | 3 refills | Status: DC | PRN

## 2018-03-15 MED ORDER — GABAPENTIN 300 MG PO CAPS: 300.0000 mg | ORAL_CAPSULE | Freq: Every day | ORAL | 3 refills | Status: DC

## 2018-03-15 MED ORDER — METHOCARBAMOL 500 MG PO TABS
500.0000 mg | ORAL_TABLET | Freq: Three times a day (TID) | ORAL | 2 refills | Status: DC | PRN
Start: 2018-03-15 — End: 2018-06-13

## 2018-03-15 MED ORDER — IBUPROFEN 800 MG PO TABS: 800.0000 mg | ORAL_TABLET | Freq: Two times a day (BID) | ORAL | 2 refills | Status: DC | PRN

## 2018-03-15 MED ORDER — PREDNISONE 20 MG PO TABS: 20.0000 mg | ORAL_TABLET | Freq: Every day | ORAL | 0 refills | Status: DC

## 2018-03-15 MED ORDER — EZETIMIBE 10 MG PO TABS: 10.0000 mg | ORAL_TABLET | Freq: Every day | ORAL | 3 refills | Status: DC

## 2018-03-15 NOTE — Progress Notes (Signed)
Subjective:  Patient ID: Jill Anderson, female    DOB: Jun 06, 1965  Age: 52 y.o. MRN: [id number]  CC: Diabetes   HPI Jill Anderson  is a 52 year old female with a history of Type 2 Diabetes Mellitus (A1c 6.5), Hypercholesterolemia, chronic neck and chronic low back pain secondary to degenerative changes of the spine, Insomnia who presents today for follow-up visit. She continues to experience L neck and shoulder pain and radiculopathy as well as low back pain for which I had referred her for PT however due to caring for her sick husband she never made it. She uses Tramadol sparingly. She states they recently separated and he went to [place] but she states she is good and lives with her grand daughter.  With regards to her Diabetes Mellitus she is doing well and denies hypoglycemia, visual concerns and neuropathy is stable. Doing well on Zetia as she is unable to take statin due to intolerance. She has left sided pelvic pain on urination but no dysuria or fever.  Past Medical History:  Diagnosis Date  . Borderline diabetic   . Diabetes mellitus without complication [facility])     Past Surgical History:  Procedure Laterality Date  . ABDOMINAL HYSTERECTOMY    . BACK SURGERY    . CYSTOSCOPY WITH RETROGRADE PYELOGRAM, URETEROSCOPY AND STENT PLACEMENT Left 10/28/2015   Procedure: CYSTOSCOPY WITH RETROGRADE PYELOGRAM, URETEROSCOPY AND STENT PLACEMENT, AND LASER;  Surgeon: Bjorn Pippin, MD;  Location: [facility];  Service: Urology;  Laterality: Left;    Allergies  Allergen Reactions  . Atorvastatin Other (See Comments)    myalgia     Outpatient Medications Prior to Visit  Medication Sig Dispense Refill  . aspirin EC 81 MG tablet Take 1 tablet (81 mg total) by mouth daily. 30 tablet 11  . cetirizine (ZYRTEC) 10 MG tablet Take 1 tablet (10 mg total) by mouth daily. 30 tablet 1  . Elastic Bandages & Supports (TRUFORM ARM SLEEVE M 15-20MMHG) MISC 1 each by Does not apply route daily. 1 each 0  .  ergocalciferol (DRISDOL) 50000 units capsule Take 1 capsule (50,000 Units total) by mouth once a week. 9 capsule 1  . Misc. Devices (CANE) MISC 1 each by Does not apply route as needed. 1 each 0  . psyllium (METAMUCIL) 58.6 % powder Take 1 packet by mouth 2 (two) times daily.    . traMADol (ULTRAM) 50 MG tablet Take 1 tablet (50 mg total) by mouth every 6 (six) hours as needed for moderate pain. 60 tablet 2  . ezetimibe (ZETIA) 10 MG tablet Take 1 tablet (10 mg total) by mouth daily. 30 tablet 3  . gabapentin (NEURONTIN) 300 MG capsule Take 1 capsule (300 mg total) by mouth at bedtime. 30 capsule 3  . ibuprofen (ADVIL,MOTRIN) 800 MG tablet Take 1 tablet (800 mg total) by mouth every 12 (twelve) hours as needed. 60 tablet 2  . metFORMIN (GLUCOPHAGE) 500 MG tablet Take 2 tablets (1,000 mg total) by mouth 2 (two) times daily with a meal. 120 tablet 6  . methocarbamol (ROBAXIN) 500 MG tablet Take 1 tablet (500 mg total) by mouth every 8 (eight) hours as needed for muscle spasms. 90 tablet 2  . predniSONE (DELTASONE) 20 MG tablet Take 1 tablet (20 mg total) by mouth daily with breakfast. 5 tablet 0  . traZODone (DESYREL) 100 MG tablet Take 1 tablet (100 mg total) by mouth at bedtime as needed for sleep. 30 tablet 3   No facility-administered medications  prior to visit.     ROS Review of Systems  Constitutional: Negative for activity change, appetite change and fatigue.  HENT: Negative for congestion, sinus pressure and sore throat.   Eyes: Negative for visual disturbance.  Respiratory: Negative for cough, chest tightness, shortness of breath and wheezing.   Cardiovascular: Negative for chest pain and palpitations.  Gastrointestinal: Negative for abdominal distention, abdominal pain and constipation.  Endocrine: Negative for polydipsia.  Genitourinary: Negative for dysuria and frequency.  Musculoskeletal: Positive for back pain and neck pain. Negative for arthralgias.  Skin: Negative for rash.    Neurological: Positive for numbness. Negative for tremors and light-headedness.  Hematological: Does not bruise/bleed easily.  Psychiatric/Behavioral: Negative for agitation and behavioral problems.    Objective:  BP 120/78   Pulse 69   Temp 97.7 F (36.5 C) (Oral)   Ht 5\' 2"  (1.575 m)   Wt 173 lb 9.6 oz (78.7 kg)   SpO2 100%   BMI 31.75 kg/m   BP/Weight 03/15/2018 12/13/2017 11/01/2017  Systolic BP 120 136 146  Diastolic BP 78 86 85  Wt. (Lbs) 173.6 173 174.6  BMI 31.75 31.64 31.93      Physical Exam  Constitutional: She is oriented to person, place, and time. She appears well-developed and well-nourished.  Cardiovascular: Normal rate, normal heart sounds and intact distal pulses.  No murmur heard. Pulmonary/Chest: Effort normal and breath sounds normal. She has no wheezes. She has no rales. She exhibits no tenderness.  Abdominal: Soft. Bowel sounds are normal. She exhibits no distension and no mass. There is no tenderness.  Musculoskeletal:  TTP of left lateral aspect of neck. TTP of lumbar spine;positive straight leg raise b/l  Neurological: She is alert and oriented to person, place, and time. She displays abnormal reflex (hyporeflexia of knee). She exhibits normal muscle tone.  Hand grip slightly reduced in Lhand  Skin: Skin is warm and dry.  Psychiatric: She has a normal mood and affect.    CMP Latest Ref Rng & Units 11/01/2017 05/14/2017 12/14/2016  Glucose 65 - 99 mg/dL 829(F) 621(H) 84  BUN 6 - 24 mg/dL 10 11 10   Creatinine 0.57 - 1.00 mg/dL 0.86 5.78 4.69  Sodium 134 - 144 mmol/L 137 140 139  Potassium 3.5 - 5.2 mmol/L 4.7 4.6 4.5  Chloride 96 - 106 mmol/L 100 103 100  CO2 20 - 29 mmol/L 24 23 20   Calcium 8.7 - 10.2 mg/dL 9.6 9.7 9.8  Total Protein 6.0 - 8.5 g/dL 7.6 7.5 7.5  Total Bilirubin 0.0 - 1.2 mg/dL 1.0 0.9 1.2  Alkaline Phos 39 - 117 IU/L 58 54 56  AST 0 - 40 IU/L 22 23 23   ALT 0 - 32 IU/L 25 29 22     Lipid Panel     Component Value Date/Time    CHOL 203 (H) 11/01/2017 1139   TRIG 91 11/01/2017 1139   HDL 49 11/01/2017 1139   CHOLHDL 4.1 11/01/2017 1139   CHOLHDL 4.8 07/12/2015 1008   VLDL 14 07/12/2015 1008   LDLCALC 136 (H) 11/01/2017 1139    Lab Results  Component Value Date   HGBA1C 6.5 03/15/2018    Assessment & Plan:   1. Type 2 diabetes mellitus with hyperglycemia, without long-term current use of insulin (HCC) Controlled with A1c of 6.5 Continue current regimen , diabetic diet and lifestyle modifications - POCT glucose (manual entry) - POCT glycosylated hemoglobin (Hb A1C)  2. Painful urination UA negative, will send off culture Possible pelvic muscle spasm -  POCT URINALYSIS DIP (CLINITEK) - Urine Culture  3. Hyperlipidemia associated with type 2 diabetes mellitus (HCC) Uncontrolled Statin intolerance - ezetimibe (ZETIA) 10 MG tablet; Take 1 tablet (10 mg total) by mouth daily.  Dispense: 30 tablet; Refill: 3  4. Cervical disc disorder with radiculopathy of cervical region Referred for PT again - gabapentin (NEURONTIN) 300 MG capsule; Take 1 capsule (300 mg total) by mouth at bedtime.  Dispense: 30 capsule; Refill: 3 - ibuprofen (ADVIL,MOTRIN) 800 MG tablet; Take 1 tablet (800 mg total) by mouth every 12 (twelve) hours as needed.  Dispense: 60 tablet; Refill: 2 - methocarbamol (ROBAXIN) 500 MG tablet; Take 1 tablet (500 mg total) by mouth every 8 (eight) hours as needed for muscle spasms.  Dispense: 90 tablet; Refill: 2 - predniSONE (DELTASONE) 20 MG tablet; Take 1 tablet (20 mg total) by mouth daily with breakfast.  Dispense: 5 tablet; Refill: 0 - Ambulatory referral to Physical Therapy  5. Chronic radicular low back pain Uncontrolled Referred for PT  - ibuprofen (ADVIL,MOTRIN) 800 MG tablet; Take 1 tablet (800 mg total) by mouth every 12 (twelve) hours as needed.  Dispense: 60 tablet; Refill: 2  6. Type 2 diabetes mellitus with other specified complication, without long-term current use of insulin  (HCC) - metFORMIN (GLUCOPHAGE) 500 MG tablet; Take 2 tablets (1,000 mg total) by mouth 2 (two) times daily with a meal.  Dispense: 120 tablet; Refill: 6  7. Chronic left shoulder pain - predniSONE (DELTASONE) 20 MG tablet; Take 1 tablet (20 mg total) by mouth daily with breakfast.  Dispense: 5 tablet; Refill: 0 - Ambulatory referral to Physical Therapy  8. Other insomnia Stable - traZODone (DESYREL) 100 MG tablet; Take 1 tablet (100 mg total) by mouth at bedtime as needed for sleep.  Dispense: 30 tablet; Refill: 3  9. Lateral epicondylitis of both elbows Short course of Prednisone   Meds ordered this encounter  Medications  . ezetimibe (ZETIA) 10 MG tablet    Sig: Take 1 tablet (10 mg total) by mouth daily.    Dispense:  30 tablet    Refill:  3    Discontinue atorvastatin  . gabapentin (NEURONTIN) 300 MG capsule    Sig: Take 1 capsule (300 mg total) by mouth at bedtime.    Dispense:  30 capsule    Refill:  3  . ibuprofen (ADVIL,MOTRIN) 800 MG tablet    Sig: Take 1 tablet (800 mg total) by mouth every 12 (twelve) hours as needed.    Dispense:  60 tablet    Refill:  2  . metFORMIN (GLUCOPHAGE) 500 MG tablet    Sig: Take 2 tablets (1,000 mg total) by mouth 2 (two) times daily with a meal.    Dispense:  120 tablet    Refill:  6  . methocarbamol (ROBAXIN) 500 MG tablet    Sig: Take 1 tablet (500 mg total) by mouth every 8 (eight) hours as needed for muscle spasms.    Dispense:  90 tablet    Refill:  2  . predniSONE (DELTASONE) 20 MG tablet    Sig: Take 1 tablet (20 mg total) by mouth daily with breakfast.    Dispense:  5 tablet    Refill:  0  . traZODone (DESYREL) 100 MG tablet    Sig: Take 1 tablet (100 mg total) by mouth at bedtime as needed for sleep.    Dispense:  30 tablet    Refill:  3    Follow-up: Return in about 3  months (around 06/15/2018) for Follow-up of chronic medical conditions.   Hoy Register MD

## 2018-03-15 NOTE — Patient Instructions (Signed)
Tennis Elbow Tennis elbow is puffiness (inflammation) of the outer tendons of your forearm close to your elbow. Your tendons attach your muscles to your bones. Tennis elbow can happen in any sport or job in which you use your elbow too much. It is caused by doing the same motion over and over. Tennis elbow can cause:  Pain and tenderness in your forearm and the outer part of your elbow.  A burning feeling. This runs from your elbow through your arm.  Weak grip in your hands.  Follow these instructions at home: Activity  Rest your elbow and wrist as told by your doctor. Try to avoid any activities that caused the problem until your doctor says that you can do them again.  If a physical therapist teaches you exercises, do all of them as told.  If you lift an object, lift it with your palm facing up. This is easier on your elbow. Lifestyle  If your tennis elbow is caused by sports, check your equipment and make sure that: ? You are using it correctly. ? It fits you well.  If your tennis elbow is caused by work, take breaks often, if you are able. Talk with your manager about doing your work in a way that is safe for you. ? If your tennis elbow is caused by computer use, talk with your manager about any changes that can be made to your work setup. General instructions  If told, apply ice to the painful area: ? Put ice in a plastic bag. ? Place a towel between your skin and the bag. ? Leave the ice on for 20 minutes, 2-3 times per day.  Take medicines only as told by your doctor.  If you were given a brace, wear it as told by your doctor.  Keep all follow-up visits as told by your doctor. This is important. Contact a doctor if:  Your pain does not get better with treatment.  Your pain gets worse.  You have weakness in your forearm, hand, or fingers.  You cannot feel your forearm, hand, or fingers. This information is not intended to replace advice given to you by your health  care provider. Make sure you discuss any questions you have with your health care provider. Document Released: 11/05/2009 Document Revised: 01/16/2016 Document Reviewed: 05/14/2014 Elsevier Interactive Patient Education  2018 [employer] Inc[employer]  

## 2018-03-17 LAB — URINE CULTURE: Organism ID, Bacteria: NO GROWTH

## 2018-03-28 ENCOUNTER — Ambulatory Visit: Payer: Medicare HMO | Admitting: Physical Therapy

## 2018-04-05 ENCOUNTER — Ambulatory Visit: Payer: Medicare HMO | Admitting: Physical Therapy

## 2018-04-07 ENCOUNTER — Ambulatory Visit: Payer: Medicare HMO | Attending: Family Medicine | Admitting: Physical Therapy

## 2018-04-07 ENCOUNTER — Encounter: Payer: Self-pay | Admitting: Physical Therapy

## 2018-04-07 ENCOUNTER — Other Ambulatory Visit: Payer: Self-pay

## 2018-04-07 DIAGNOSIS — G8929 Other chronic pain: Secondary | ICD-10-CM | POA: Insufficient documentation

## 2018-04-07 DIAGNOSIS — M25512 Pain in left shoulder: Secondary | ICD-10-CM | POA: Diagnosis not present

## 2018-04-07 DIAGNOSIS — M25612 Stiffness of left shoulder, not elsewhere classified: Secondary | ICD-10-CM | POA: Insufficient documentation

## 2018-04-07 DIAGNOSIS — M6281 Muscle weakness (generalized): Secondary | ICD-10-CM | POA: Insufficient documentation

## 2018-04-07 DIAGNOSIS — M542 Cervicalgia: Secondary | ICD-10-CM | POA: Diagnosis not present

## 2018-04-07 DIAGNOSIS — R29898 Other symptoms and signs involving the musculoskeletal system: Secondary | ICD-10-CM | POA: Diagnosis not present

## 2018-04-07 DIAGNOSIS — M545 Low back pain: Secondary | ICD-10-CM | POA: Insufficient documentation
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
## 2018-04-07 NOTE — Patient Instructions (Addendum)
Trigger Point Dry Needling  . What is Trigger Point Dry Needling (DN)? o DN is a physical therapy technique used to treat muscle pain and dysfunction. Specifically, DN helps deactivate muscle trigger points (muscle knots).  o A thin filiform needle is used to penetrate the skin and stimulate the underlying trigger point. The goal is for a local twitch response (LTR) to occur and for the trigger point to relax. No medication of any kind is injected during the procedure.   . What Does Trigger Point Dry Needling Feel Like?  o The procedure feels different for each individual patient. Some patients report that they do not actually feel the needle enter the skin and overall the process is not painful. Very mild bleeding may occur. However, many patients feel a deep cramping in the muscle in which the needle was inserted. This is the local twitch response.   Marland Kitchen How Will I feel after the treatment? o Soreness is normal, and the onset of soreness may not occur for a few hours. Typically this soreness does not last longer than two days.  o Bruising is uncommon, however; ice can be used to decrease any possible bruising.  o In rare cases feeling tired or nauseous after the treatment is normal. In addition, your symptoms may get worse before they get better, this period will typically not last longer than 24 hours.   . What Can I do After My Treatment? o Increase your hydration by drinking more water for the next 24 hours. o You may place ice or heat on the areas treated that have become sore, however, do not use heat on inflamed or bruised areas. Heat often brings more relief post needling. o You can continue your regular activities, but vigorous activity is not recommended initially after the treatment for 24 hours. o DN is best combined with other physical therapy such as strengthening, stretching, and other therapies.     Leg Lengthener: Full    Straighten one leg. Pull toes AND forefoot toward  knee, extend heel. Lengthen leg by pulling pelvis away from ribs. Hold _3-5__ seconds. Relax. Repeat 1 time. Re-bend knee. Do other leg. Each leg 10___ times. Perform twice a day

## 2018-04-19 ENCOUNTER — Ambulatory Visit: Payer: Medicare HMO | Admitting: Physical Therapy

## 2018-04-19 ENCOUNTER — Encounter: Payer: Self-pay | Admitting: Physical Therapy

## 2018-04-19 DIAGNOSIS — M545 Low back pain, unspecified: Secondary | ICD-10-CM

## 2018-04-19 DIAGNOSIS — M25612 Stiffness of left shoulder, not elsewhere classified: Secondary | ICD-10-CM

## 2018-04-19 DIAGNOSIS — M542 Cervicalgia: Secondary | ICD-10-CM | POA: Diagnosis not present

## 2018-04-19 DIAGNOSIS — M25512 Pain in left shoulder: Secondary | ICD-10-CM

## 2018-04-19 DIAGNOSIS — M6281 Muscle weakness (generalized): Secondary | ICD-10-CM | POA: Diagnosis not present

## 2018-04-19 DIAGNOSIS — R29898 Other symptoms and signs involving the musculoskeletal system: Secondary | ICD-10-CM

## 2018-04-19 DIAGNOSIS — G8929 Other chronic pain: Secondary | ICD-10-CM | POA: Diagnosis not present

## 2018-04-19 NOTE — Therapy (Signed)
[facility] [address] [place], [place], [address] Phone: [phone]   Fax:  [phone]  Physical Therapy Treatment  Patient Details  Name: [place] [name] MRN: [id number] Date of Birth: 11/07/1965 Referring Provider (PT): Dr Hoy RegisterEnobong Newlin   Encounter Date: 04/19/2018  PT End of Session - 04/19/18 1331    Visit Number  2    Number of Visits  6    Date for PT Re-Evaluation  05/19/18    PT Start Time  1330    PT Stop Time  1413    PT Time Calculation (min)  43 min    Activity Tolerance  Patient tolerated treatment well    Behavior During Therapy  [facility] for tasks assessed/performed       Past Medical History:  Diagnosis Date  . Borderline diabetic   . Diabetes mellitus without complication [facility](HCC)     Past Surgical History:  Procedure Laterality Date  . ABDOMINAL HYSTERECTOMY    . BACK SURGERY    . CYSTOSCOPY WITH RETROGRADE PYELOGRAM, URETEROSCOPY AND STENT PLACEMENT Left 10/28/2015   Procedure: CYSTOSCOPY WITH RETROGRADE PYELOGRAM, URETEROSCOPY AND STENT PLACEMENT, AND LASER;  Surgeon: Bjorn PippinJohn Wrenn, MD;  Location: [facility];  Service: Urology;  Laterality: Left;    There were no vitals filed for this visit.  Subjective Assessment - 04/19/18 1331    Subjective  Felt very stiff after last visit and swollen.     Patient Stated Goals  ease pain so she can do more for herself and walk easier, tolerate pain better.     Currently in Pain?  Yes    Pain Score  8     Pain Location  Neck    Pain Orientation  Left    Pain Descriptors / Indicators  --   stiff   Aggravating Factors   weather    Pain Relieving Factors  meds                       [facility] Adult PT Treatment/Exercise - 04/19/18 0001      Exercises   Exercises  Neck      Neck Exercises: Supine   Neck Retraction  15 reps    Shoulder Flexion Limitations  AAROM with wand    Other Supine Exercise  external rotation AAROM    Other Supine Exercise  LTRs       Manual Therapy   Manual Therapy  Soft tissue mobilization;Joint mobilization    Manual therapy comments  cervical traction 3x1 min; skilled palpation and monitoring during TPDN    Joint Mobilization  bilateral cervical lateral glides, mob into Lt cervical rot    Soft tissue mobilization  bil paraspinals, suboccipitals, Lt upper trap      Neck Exercises: Stretches   Other Neck Stretches  AROM stretching       Trigger Point Dry Needling - 04/19/18 1411    Consent Given?  Yes    Education Handout Provided  --   verbal education   Muscles Treated Upper Body  Upper trapezius    Upper Trapezius Response  Twitch reponse elicited;Palpable increased muscle length   Lt          PT Education - 04/19/18 1422    Education Details  TPDN & expected outcomes, shoe wear & effects along post chain. importance of motion & gradual increase, use of AD & options    Person(s) Educated  Patient    Methods  Explanation  Comprehension  Verbalized understanding;Need further instruction          PT Long Term Goals - 04/07/18 1234      PT LONG TERM GOAL #1   Title  I with HEP to include a walking program ( 05/19/18)     Time  6    Period  Weeks    Status  New    Target Date  05/19/18      PT LONG TERM GOAL #2   Title  improve cervical and lumbar rotation to [facility] to allow ease with ADLs ( 05/19/18)     Time  6    Period  Weeks    Status  New    Target Date  05/19/18      PT LONG TERM GOAL #3   Title  improve FOTO =/< 51% limited ( 05/19/18)     Time  6    Period  Weeks    Status  New    Target Date  05/19/18      PT LONG TERM GOAL #4   Title  report =/> 50% reduction in overall body pain to allow her to perform light house cleaning ( 05/19/18)     Time  6    Period  Weeks    Status  New    Target Date  05/19/18            Plan - 04/19/18 1414    Clinical Impression Statement  Tolerated DN to upper trap well, if she feels benefit would benefit from adding paraspinals  around C5 & scalenes on Lt. Able to perform AAROM flx to 90 with difficulty. Discussed use of cane and importance of reducing use as quickly as posisble.  Pt reports she has to wwear heels in her shoes because being flat is painful for her back- discussed importance of stretching to be able to wear flat shoes.     PT Treatment/Interventions  Iontophoresis 4mg /ml Dexamethasone;Neuromuscular re-education;Dry needling;Spinal Manipulations;Joint Manipulations;Manual techniques;Moist Heat;Traction;Patient/family education;Taping;Ultrasound;Cryotherapy;Electrical Stimulation;Therapeutic exercise;Passive range of motion    PT Next Visit Plan  outcome of DN? add PRN, periscap strength, consider SNAG. stretch post chain- gastroc, HS etc    Consulted and Agree with Plan of Care  Patient       Patient will benefit from skilled therapeutic intervention in order to improve the following deficits and impairments:  Pain, Improper body mechanics, Postural dysfunction, Increased muscle spasms, Impaired UE functional use, Decreased strength, Decreased range of motion  Visit Diagnosis: Cervicalgia  Muscle weakness (generalized)  Chronic bilateral low back pain without sciatica  Other symptoms and signs involving the musculoskeletal system  Chronic left shoulder pain  Stiffness of left shoulder, not elsewhere classified     Problem List Patient Active Problem List   Diagnosis Date Noted  . Vitamin D deficiency 09/21/2017  . Insomnia 05/14/2017  . Menopausal symptoms 01/04/2017  . Urticaria 12/14/2016  . Pain in both hands 12/14/2016  . Hypertension 10/27/2016  . Bilateral anterior knee pain 03/20/2016  . Left ureteral stone 10/27/2015  . New onset of headaches after age 66 10/07/2015  . Hyperlipidemia associated with type 2 diabetes mellitus (HCC) 07/15/2015  . DM2 (diabetes mellitus, type 2) (HCC) 03/04/2015  . Chronic neck pain 09/11/2014  . Cervical disc disorder with radiculopathy of cervical  region 09/11/2014  . Chronic radicular low back pain 09/11/2014    Maritta Kief C. Delainie Chavana PT, DPT 04/19/18 2:23 PM   [facility] [address]  [address] [place], [place], [address] Phone: [phone]   Fax:  [phone]  Name: Jill Anderson MRN: [id number] Date of Birth: 05-Dec-1965

## 2018-04-26 ENCOUNTER — Ambulatory Visit: Payer: Medicare HMO | Admitting: Physical Therapy

## 2018-04-26 ENCOUNTER — Encounter: Payer: Self-pay | Admitting: Physical Therapy

## 2018-04-26 DIAGNOSIS — M545 Low back pain: Secondary | ICD-10-CM | POA: Diagnosis not present

## 2018-04-26 DIAGNOSIS — M25512 Pain in left shoulder: Secondary | ICD-10-CM | POA: Diagnosis not present

## 2018-04-26 DIAGNOSIS — G8929 Other chronic pain: Secondary | ICD-10-CM

## 2018-04-26 DIAGNOSIS — M6281 Muscle weakness (generalized): Secondary | ICD-10-CM | POA: Diagnosis not present

## 2018-04-26 DIAGNOSIS — M542 Cervicalgia: Secondary | ICD-10-CM | POA: Diagnosis not present

## 2018-04-26 DIAGNOSIS — M25612 Stiffness of left shoulder, not elsewhere classified: Secondary | ICD-10-CM | POA: Diagnosis not present

## 2018-04-26 DIAGNOSIS — R29898 Other symptoms and signs involving the musculoskeletal system: Secondary | ICD-10-CM | POA: Diagnosis not present

## 2018-04-26 NOTE — Therapy (Signed)
[facility] [address] [place], [place], [address] Phone: [phone]   Fax:  [phone]  Physical Therapy Treatment  Patient Details  Name: Jill Anderson MRN: [id number] Date of Birth: 05-18-1966 Referring Provider (PT): Dr Hoy Register   Encounter Date: 04/26/2018  PT End of Session - 04/26/18 1121    Visit Number  3    Number of Visits  6    Date for PT Re-Evaluation  05/19/18    PT Start Time  1100    PT Stop Time  1145    PT Time Calculation (min)  45 min       Past Medical History:  Diagnosis Date  . Borderline diabetic   . Diabetes mellitus without complication [facility])     Past Surgical History:  Procedure Laterality Date  . ABDOMINAL HYSTERECTOMY    . BACK SURGERY    . CYSTOSCOPY WITH RETROGRADE PYELOGRAM, URETEROSCOPY AND STENT PLACEMENT Left 10/28/2015   Procedure: CYSTOSCOPY WITH RETROGRADE PYELOGRAM, URETEROSCOPY AND STENT PLACEMENT, AND LASER;  Surgeon: Bjorn Pippin, MD;  Location: [facility];  Service: Urology;  Laterality: Left;    There were no vitals filed for this visit.  Subjective Assessment - 04/26/18 1103    Subjective  Dn helped me a little.     Currently in Pain?  Yes    Pain Score  7     Pain Location  Neck    Pain Orientation  Left;Posterior    Pain Descriptors / Indicators  Tightness;Aching   stiff   Aggravating Factors   right side lying, reaching with left hand     Pain Relieving Factors  dry needle, meds, ice     Pain Score  9    Pain Location  Back    Pain Orientation  Lower    Pain Descriptors / Indicators  Sharp   pressing   Pain Radiating Towards  down left leg to knee     Aggravating Factors   prolonged sitting, walking, bending     Pain Relieving Factors  ice                       [facility] Adult PT Treatment/Exercise - 04/26/18 0001      Self-Care   Other Self-Care Comments   Different bolsters availiable for positioning of LE       Exercises   Exercises   Lumbar    Other Exercises   --      Neck Exercises: Seated   Neck Retraction  10 reps      Lumbar Exercises: Stretches   Active Hamstring Stretch  3 reps;10 seconds    Other Lumbar Stretch Exercise  seated gastroc stretch using her SPC       Lumbar Exercises: Supine   Pelvic Tilt  10 reps    Other Supine Lumbar Exercises  decompression series       Neck Exercises: Stretches   Other Neck Stretches  AROM stretching    Other Neck Stretches  upper cervical rotations                   PT Long Term Goals - 04/07/18 1234      PT LONG TERM GOAL #1   Title  I with HEP to include a walking program ( 05/19/18)     Time  6    Period  Weeks    Status  New    Target Date  05/19/18  PT LONG TERM GOAL #2   Title  improve cervical and lumbar rotation to [facility] to allow ease with ADLs ( 05/19/18)     Time  6    Period  Weeks    Status  New    Target Date  05/19/18      PT LONG TERM GOAL #3   Title  improve FOTO =/< 51% limited ( 05/19/18)     Time  6    Period  Weeks    Status  New    Target Date  05/19/18      PT LONG TERM GOAL #4   Title  report =/> 50% reduction in overall body pain to allow her to perform light house cleaning ( 05/19/18)     Time  6    Period  Weeks    Status  New    Target Date  05/19/18            Plan - 04/26/18 1133    Clinical Impression Statement  Pt arrives reporting high levels of neck and back pain. She reports improvement in pain after TPDN that returned shortly after.  She has difficulty with lying supine. She did respond well to decompression series, especially chin tucks and felt relief of pressure. Also able to add hamstring and gastroc stretching to HEP with cues to perform short gentle stretches. At end of session she reports no increased pian.     PT Next Visit Plan  outcome of DN? add PRN, periscap strength, consider SNAG. stretch post chain- review stretches and decompression HEP    PT Home Exercise Plan  shoulder rolls,  scap squeezes, cervical AROM, calf stretch seated using towel or SPC, seated hamstring stretch, decompression series.     Consulted and Agree with Plan of Care  Patient       Patient will benefit from skilled therapeutic intervention in order to improve the following deficits and impairments:  Pain, Improper body mechanics, Postural dysfunction, Increased muscle spasms, Impaired UE functional use, Decreased strength, Decreased range of motion  Visit Diagnosis: Cervicalgia  Muscle weakness (generalized)  Chronic bilateral low back pain without sciatica  Other symptoms and signs involving the musculoskeletal system     Problem List Patient Active Problem List   Diagnosis Date Noted  . Vitamin D deficiency 09/21/2017  . Insomnia 05/14/2017  . Menopausal symptoms 01/04/2017  . Urticaria 12/14/2016  . Pain in both hands 12/14/2016  . Hypertension 10/27/2016  . Bilateral anterior knee pain 03/20/2016  . Left ureteral stone 10/27/2015  . New onset of headaches after age 52 10/07/2015  . Hyperlipidemia associated with type 2 diabetes mellitus (HCC) 07/15/2015  . DM2 (diabetes mellitus, type 2) (HCC) 03/04/2015  . Chronic neck pain 09/11/2014  . Cervical disc disorder with radiculopathy of cervical region 09/11/2014  . Chronic radicular low back pain 09/11/2014    Sherrie Mustacheonoho, Adriane Guglielmo McGee, PTA 04/26/2018, 1:20 PM  [facility] [address] [place], [place], [address] Phone: [phone]   Fax:  [phone]  Name: [place] CellarFlora Gengler MRN: [id number] Date of Birth: 1966/02/02

## 2018-04-26 NOTE — Patient Instructions (Signed)
  Decompression Series"

## 2018-05-03 ENCOUNTER — Ambulatory Visit: Payer: Medicare HMO | Attending: Family Medicine | Admitting: Physical Therapy

## 2018-05-03 ENCOUNTER — Encounter: Payer: Self-pay | Admitting: Physical Therapy

## 2018-05-03 DIAGNOSIS — M545 Low back pain, unspecified: Secondary | ICD-10-CM

## 2018-05-03 DIAGNOSIS — R29898 Other symptoms and signs involving the musculoskeletal system: Secondary | ICD-10-CM

## 2018-05-03 DIAGNOSIS — M6281 Muscle weakness (generalized): Secondary | ICD-10-CM

## 2018-05-03 DIAGNOSIS — M542 Cervicalgia: Secondary | ICD-10-CM | POA: Diagnosis not present

## 2018-05-03 DIAGNOSIS — G8929 Other chronic pain: Secondary | ICD-10-CM | POA: Diagnosis not present

## 2018-05-03 NOTE — Therapy (Addendum)
[place], [place], [address] Phone: [phone]   Fax:  [phone]  Physical Therapy Treatment  Patient Details  Name: Jill Anderson MRN: [id number] Date of Birth: 1966-01-08 Referring Provider (PT): Dr Charlott Rakes   Encounter Date: 05/03/2018  PT End of Session - 05/03/18 1051    Visit Number  4    Number of Visits  6    Date for PT Re-Evaluation  05/19/18    PT Start Time  0272    PT Stop Time  1148    PT Time Calculation (min)  57 min    Activity Tolerance  Patient limited by pain       Past Medical History:  Diagnosis Date  . Borderline diabetic   . Diabetes mellitus without complication [facility])     Past Surgical History:  Procedure Laterality Date  . ABDOMINAL HYSTERECTOMY    . BACK SURGERY    . CYSTOSCOPY WITH RETROGRADE PYELOGRAM, URETEROSCOPY AND STENT PLACEMENT Left 10/28/2015   Procedure: CYSTOSCOPY WITH RETROGRADE PYELOGRAM, URETEROSCOPY AND STENT PLACEMENT, AND LASER;  Surgeon: Irine Seal, MD;  Location: [facility];  Service: Urology;  Laterality: Left;    There were no vitals filed for this visit.  Subjective Assessment - 05/03/18 1052    Subjective  Pt reports she is sore all over, the rain yesterday did not help.  She is doing her HEP however it makes her more sore.     Patient Stated Goals  ease pain so she can do more for herself and walk easier, tolerate pain better.     Currently in Pain?  Yes    Pain Score  9     Pain Location  --   all over   Pain Descriptors / Indicators  Sharp    Pain Type  Chronic pain    Pain Onset  More than a month ago    Pain Frequency  Constant    Aggravating Factors   everything    Pain Relieving Factors  meds, ice                       [facility] Adult PT Treatment/Exercise - 05/03/18 0001      Exercises   Exercises  Lumbar      Neck Exercises: Seated   Neck Retraction  --   10 reps BWD shoulder rolls     Lumbar Exercises:  Stretches   Hip Flexor Stretch  Left;Right;30 seconds      Lumbar Exercises: Aerobic   Nustep  attempted, 40 sec, pt reports sharp pain into her Lt shoulder and stopped.       Lumbar Exercises: Supine   Ab Set  10 reps;5 seconds    Other Supine Lumbar Exercises  10 reps decompression ex, upper and lower body.       Modalities   Modalities  Electrical Stimulation;Moist Heat      Moist Heat Therapy   Number Minutes Moist Heat  15 Minutes    Moist Heat Location  Cervical;Lumbar Spine   seated per pt request     Electrical Stimulation   Electrical Stimulation Location  cervical bilat    Electrical Stimulation Action  IFC    Electrical Stimulation Parameters  to tolerance    Electrical Stimulation Goals  Tone;Pain      Manual Therapy   Manual Therapy  Soft tissue mobilization    Soft tissue mobilization  skilled palpation during DN with STM  to bilat upper traps,levators and cervical paraspinals       Trigger Point Dry Needling - 05/03/18 1106    Consent Given?  Yes    Education Handout Provided  No    Muscles Treated Upper Body  Upper trapezius;Longissimus;Levator scapulae    Upper Trapezius Response  Palpable increased muscle length;Twitch reponse elicited   bilat with stim   Levator Scapulae Response  Twitch response elicited;Palpable increased muscle length   bilat   Longissimus Response  Palpable increased muscle length;Twitch response elicited   cervical upper bilat            PT Short Term Goals - 05/03/18 1108      PT SHORT TERM GOAL #1   Title  Patient to be indepedent with appropriate HEP, to be updated PRN     Status  Achieved      PT SHORT TERM GOAL #2   Title  -----      PT SHORT TERM GOAL #3   Title  ------      PT SHORT TERM GOAL #4   Title  -------      PT SHORT TERM GOAL #5   Title  -------        PT Long Term Goals - 05/03/18 1108      PT LONG TERM GOAL #1   Title  I with HEP to include a walking program ( 05/19/18)     Status   On-going      PT LONG TERM GOAL #2   Status  On-going      PT LONG TERM GOAL #3   Title  improve FOTO =/< 51% limited ( 05/19/18)     Status  On-going      PT LONG TERM GOAL #4   Title  report =/> 50% reduction in overall body pain to allow her to perform light house cleaning ( 05/19/18)     Baseline  she reports improvement immediately after therapy however symtpoms return within a day or two    Status  On-going            Plan - 05/03/18 [place] continues with high levels of total body pain,  she agreed to try another session of DN to the neck and upper shoulders.  Stim was used with it this time.  She has significant tightness with the left side being tighter and a large trigger point inthe levator .  Palpable tightness was less after manual work.  She continues to have some relief with decompression series.  New onset of anterior thigh pain was relieved with hip flexor stretching. No new goals met.     Rehab Potential  Fair    PT Frequency  1x / week    PT Duration  6 weeks    PT Treatment/Interventions  Iontophoresis 37m/ml Dexamethasone;Neuromuscular re-education;Dry needling;Spinal Manipulations;Joint Manipulations;Manual techniques;Moist Heat;Traction;Patient/family education;Taping;Ultrasound;Cryotherapy;Electrical Stimulation;Therapeutic exercise;Passive range of motion    PT Next Visit Plan  assess response to second round of DN, back of the body strenthening, general stretching.     Consulted and Agree with Plan of Care  Patient       Patient will benefit from skilled therapeutic intervention in order to improve the following deficits and impairments:  Pain, Improper body mechanics, Postural dysfunction, Increased muscle spasms, Impaired UE functional use, Decreased strength, Decreased range of motion  Visit Diagnosis: Cervicalgia  Muscle weakness (generalized)  Chronic bilateral low back pain without sciatica  Other symptoms  and signs involving the musculoskeletal system     Problem List Patient Active Problem List   Diagnosis Date Noted  . Vitamin D deficiency 09/21/2017  . Insomnia 05/14/2017  . Menopausal symptoms 01/04/2017  . Urticaria 12/14/2016  . Pain in both hands 12/14/2016  . Hypertension 10/27/2016  . Bilateral anterior knee pain 03/20/2016  . Left ureteral stone 10/27/2015  . New onset of headaches after age 3 10/07/2015  . Hyperlipidemia associated with type 2 diabetes mellitus ([place]) 07/15/2015  . DM2 (diabetes mellitus, type 2) ([place]) 03/04/2015  . Chronic neck pain 09/11/2014  . Cervical disc disorder with radiculopathy of cervical region 09/11/2014  . Chronic radicular low back pain 09/11/2014    Jeral Pinch PT  05/03/2018, 11:34 AM  [facility] [address] [place], [place], [address] Phone: [phone]   Fax:  [phone]  Name: Jill Anderson MRN: [id number] Date of Birth: [birth year]  PHYSICAL THERAPY DISCHARGE SUMMARY  Visits from Start of Care:4  Current functional level related to goals / functional outcomes: Unknown, patient has not returned for tx   Remaining deficits: unknown   Education / Equipment: HEP and DN Plan:                                                    Patient goals were not met. Patient is being discharged due to not returning since the last visit.  ?????    Pt was performing her initial HEP as of her last visit, she has not returned since 05/03/18  Jeral Pinch, PT 06/20/18 11:35 AM

## 2018-06-13 ENCOUNTER — Ambulatory Visit: Payer: Medicare HMO | Attending: Family Medicine | Admitting: Family Medicine

## 2018-06-13 ENCOUNTER — Encounter: Payer: Self-pay | Admitting: Family Medicine

## 2018-06-13 VITALS — BP 118/76 | HR 69 | Temp 97.8°F | Ht 62.0 in | Wt 175.6 lb

## 2018-06-13 DIAGNOSIS — E1165 Type 2 diabetes mellitus with hyperglycemia: Secondary | ICD-10-CM | POA: Diagnosis not present

## 2018-06-13 DIAGNOSIS — R310 Gross hematuria: Secondary | ICD-10-CM | POA: Diagnosis not present

## 2018-06-13 DIAGNOSIS — M5010 Cervical disc disorder with radiculopathy, unspecified cervical region: Secondary | ICD-10-CM

## 2018-06-13 DIAGNOSIS — E785 Hyperlipidemia, unspecified: Secondary | ICD-10-CM

## 2018-06-13 DIAGNOSIS — E1169 Type 2 diabetes mellitus with other specified complication: Secondary | ICD-10-CM

## 2018-06-13 LAB — POCT GLYCOSYLATED HEMOGLOBIN (HGB A1C): HBA1C, POC (CONTROLLED DIABETIC RANGE): 6.8 % (ref 0.0–7.0)

## 2018-06-13 LAB — POCT URINALYSIS DIP (CLINITEK)
Bilirubin, UA: NEGATIVE
Blood, UA: NEGATIVE
Glucose, UA: NEGATIVE mg/dL
Ketones, POC UA: NEGATIVE mg/dL
Nitrite, UA: NEGATIVE
POC PROTEIN,UA: NEGATIVE
Spec Grav, UA: 1.025 (ref 1.010–1.025)
Urobilinogen, UA: 0.2 E.U./dL
pH, UA: 5.5 (ref 5.0–8.0)

## 2018-06-13 LAB — GLUCOSE, POCT (MANUAL RESULT ENTRY): POC Glucose: 102 mg/dl — AB (ref 70–99)

## 2018-06-13 MED ORDER — DULOXETINE HCL 60 MG PO CPEP: 60.0000 mg | ORAL_CAPSULE | Freq: Every day | ORAL | 6 refills | Status: DC

## 2018-06-13 MED ORDER — METHOCARBAMOL 500 MG PO TABS: 500.0000 mg | ORAL_TABLET | Freq: Three times a day (TID) | ORAL | 6 refills | Status: DC | PRN

## 2018-06-13 MED ORDER — ACCU-CHEK FASTCLIX LANCETS MISC: 12 refills | Status: DC

## 2018-06-13 MED ORDER — METFORMIN HCL 500 MG PO TABS: 1000.0000 mg | ORAL_TABLET | Freq: Two times a day (BID) | ORAL | 6 refills | Status: DC

## 2018-06-13 MED ORDER — GLUCOSE BLOOD VI STRP: ORAL_STRIP | 12 refills | Status: DC

## 2018-06-13 MED ORDER — EZETIMIBE 10 MG PO TABS: 10.0000 mg | ORAL_TABLET | Freq: Every day | ORAL | 6 refills | Status: DC

## 2018-06-13 MED ORDER — ACCU-CHEK GUIDE ME W/DEVICE KIT: 1.0000 | PACK | Freq: Every day | 0 refills | Status: DC

## 2018-06-13 MED ORDER — GABAPENTIN 300 MG PO CAPS: 300.0000 mg | ORAL_CAPSULE | Freq: Every day | ORAL | 6 refills | Status: DC

## 2018-06-13 NOTE — Progress Notes (Signed)
Subjective:  Patient ID: Jill Anderson, female    DOB: 11-Jun-1965  Age: 53 y.o. MRN: [id number]  CC: Diabetes   HPI Jill Anderson  is a 53 year old female with a history of Type 2 Diabetes Mellitus (A1c 6.8), Hypercholesterolemia, chronic neck and chronic low back pain secondary to degenerative changes of the spine, Insomnia who presents today for follow-up visit. She discontinued tramadol due to itching and now uses CBD lotion over-the-counter and has experienced reduced pain but at the moment she has severe pain due to the cold weather and the rain.  I had referred her for physical therapy which she has completed and it was not beneficial. She is doing well on metformin and denies hypoglycemia, visual concerns or numbness in extremities. Due to statin myopathy she is not on a statin but remains on Zetia and low-cholesterol diet.  Other medical conditions are stable. She noticed blood when she wiped a few days ago but denies presence of blood in her urine and does not have a menstrual period as she underwent a hysterectomy a couple of years back.  Denies abdominal cramping, flank pain but did experience left lower quadrant pain a few weeks ago.  Past Medical History:  Diagnosis Date  . Borderline diabetic   . Diabetes mellitus without complication [place])     Past Surgical History:  Procedure Laterality Date  . ABDOMINAL HYSTERECTOMY    . BACK SURGERY    . CYSTOSCOPY WITH RETROGRADE PYELOGRAM, URETEROSCOPY AND STENT PLACEMENT Left 10/28/2015   Procedure: CYSTOSCOPY WITH RETROGRADE PYELOGRAM, URETEROSCOPY AND STENT PLACEMENT, AND LASER;  Surgeon: Irine Seal, MD;  Location: [facility];  Service: Urology;  Laterality: Left;    Allergies  Allergen Reactions  . Atorvastatin Other (See Comments)    myalgia     Outpatient Medications Prior to Visit  Medication Sig Dispense Refill  . aspirin EC 81 MG tablet Take 1 tablet (81 mg total) by mouth daily. 30 tablet 11  . cetirizine (ZYRTEC) 10 MG  tablet Take 1 tablet (10 mg total) by mouth daily. 30 tablet 1  . Elastic Bandages & Supports (TRUFORM ARM 51 M 15-20MMHG) MISC 1 each by Does not apply route daily. 1 each 0  . ergocalciferol (DRISDOL) 50000 units capsule Take 1 capsule (50,000 Units total) by mouth once a week. 9 capsule 1  . ibuprofen (ADVIL,MOTRIN) 800 MG tablet Take 1 tablet (800 mg total) by mouth every 12 (twelve) hours as needed. 60 tablet 2  . Misc. Devices (CANE) MISC 1 each by Does not apply route as needed. 1 each 0  . predniSONE (DELTASONE) 20 MG tablet Take 1 tablet (20 mg total) by mouth daily with breakfast. 5 tablet 0  . psyllium (METAMUCIL) 58.6 % powder Take 1 packet by mouth 2 (two) times daily.    . traZODone (DESYREL) 100 MG tablet Take 1 tablet (100 mg total) by mouth at bedtime as needed for sleep. 30 tablet 3  . ezetimibe (ZETIA) 10 MG tablet Take 1 tablet (10 mg total) by mouth daily. 30 tablet 3  . gabapentin (NEURONTIN) 300 MG capsule Take 1 capsule (300 mg total) by mouth at bedtime. 30 capsule 3  . metFORMIN (GLUCOPHAGE) 500 MG tablet Take 2 tablets (1,000 mg total) by mouth 2 (two) times daily with a meal. 120 tablet 6  . methocarbamol (ROBAXIN) 500 MG tablet Take 1 tablet (500 mg total) by mouth every 8 (eight) hours as needed for muscle spasms. 90 tablet 2  .  traMADol (ULTRAM) 50 MG tablet Take 1 tablet (50 mg total) by mouth every 6 (six) hours as needed for moderate pain. (Patient not taking: Reported on 06/13/2018) 60 tablet 2   No facility-administered medications prior to visit.     ROS Review of Systems  Constitutional: Negative for activity change, appetite change and fatigue.  HENT: Negative for congestion, sinus pressure and sore throat.   Eyes: Negative for visual disturbance.  Respiratory: Negative for cough, chest tightness, shortness of breath and wheezing.   Cardiovascular: Negative for chest pain and palpitations.  Gastrointestinal: Negative for abdominal distention,  abdominal pain and constipation.  Endocrine: Negative for polydipsia.  Genitourinary: Negative for dysuria and frequency.  Musculoskeletal:       See hpi  Skin: Negative for rash.  Neurological: Negative for tremors, light-headedness and numbness.  Hematological: Does not bruise/bleed easily.  Psychiatric/Behavioral: Negative for agitation and behavioral problems.    Objective:  BP 118/76   Pulse 69   Temp 97.8 F (36.6 C) (Oral)   Ht 5' 2"  (1.575 m)   Wt 175 lb 9.6 oz (79.7 kg)   SpO2 98%   BMI 32.12 kg/m   BP/Weight 06/13/2018 03/15/2018 1/74/9449  Systolic BP 675 916 384  Diastolic BP 76 78 86  Wt. (Lbs) 175.6 173.6 173  BMI 32.12 31.75 31.64     Physical Exam Constitutional:      Appearance: She is well-developed.  Cardiovascular:     Rate and Rhythm: Normal rate.     Heart sounds: Normal heart sounds. No murmur.  Pulmonary:     Effort: Pulmonary effort is normal.     Breath sounds: Normal breath sounds. No wheezing or rales.  Chest:     Chest wall: No tenderness.  Abdominal:     General: Bowel sounds are normal. There is no distension.     Palpations: Abdomen is soft. There is no mass.     Tenderness: There is no abdominal tenderness.  Musculoskeletal:     Comments: Tenderness to palpation across lumbar spine Positive straight leg raise bilaterally  Neurological:     Mental Status: She is alert and oriented to person, place, and time.  Psychiatric:        Mood and Affect: Mood normal.        Behavior: Behavior normal.     Lab Results  Component Value Date   HGBA1C 6.8 06/13/2018    Assessment & Plan:   1. Type 2 diabetes mellitus with other specified complication, without long-term current use of insulin (HCC) Controlled Continue current regimen, diabetic diet, lifestyle modifications - POCT glucose (manual entry) - POCT glycosylated hemoglobin (Hb A1C) - metFORMIN (GLUCOPHAGE) 500 MG tablet; Take 2 tablets (1,000 mg total) by mouth 2 (two)  times daily with a meal.  Dispense: 120 tablet; Refill: 6  2. Hyperlipidemia associated with type 2 diabetes mellitus (HCC) Uncontrolled Statin intolerance Low-cholesterol diet - ezetimibe (ZETIA) 10 MG tablet; Take 1 tablet (10 mg total) by mouth daily.  Dispense: 30 tablet; Refill: 6  3. Cervical disc disorder with radiculopathy of cervical region Currently exacerbated by the rain and cold weather Previously on tramadol which I have discontinued PT not beneficial Recommended water aerobics, yoga She uses CBD lotion OTC - DULoxetine (CYMBALTA) 60 MG capsule; Take 1 capsule (60 mg total) by mouth daily.  Dispense: 30 capsule; Refill: 6 - gabapentin (NEURONTIN) 300 MG capsule; Take 1 capsule (300 mg total) by mouth at bedtime.  Dispense: 30 capsule; Refill: 6 -  methocarbamol (ROBAXIN) 500 MG tablet; Take 1 tablet (500 mg total) by mouth every 8 (eight) hours as needed for muscle spasms.  Dispense: 90 tablet; Refill: 6  4. Gross hematuria Could be secondary to atrophic vaginitis; UA negative for blood She is status post hysterectomy If symptoms persist will consider further work-up - POCT URINALYSIS DIP (CLINITEK)   Meds ordered this encounter  Medications  . DULoxetine (CYMBALTA) 60 MG capsule    Sig: Take 1 capsule (60 mg total) by mouth daily.    Dispense:  30 capsule    Refill:  6  . ezetimibe (ZETIA) 10 MG tablet    Sig: Take 1 tablet (10 mg total) by mouth daily.    Dispense:  30 tablet    Refill:  6  . gabapentin (NEURONTIN) 300 MG capsule    Sig: Take 1 capsule (300 mg total) by mouth at bedtime.    Dispense:  30 capsule    Refill:  6  . metFORMIN (GLUCOPHAGE) 500 MG tablet    Sig: Take 2 tablets (1,000 mg total) by mouth 2 (two) times daily with a meal.    Dispense:  120 tablet    Refill:  6  . methocarbamol (ROBAXIN) 500 MG tablet    Sig: Take 1 tablet (500 mg total) by mouth every 8 (eight) hours as needed for muscle spasms.    Dispense:  90 tablet    Refill:  6   . Blood Glucose Monitoring Suppl (ACCU-CHEK GUIDE ME) w/Device KIT    Sig: 1 each by Does not apply route daily.    Dispense:  1 kit    Refill:  0  . glucose blood (ACCU-CHEK GUIDE) test strip    Sig: Use as instructed    Dispense:  100 each    Refill:  12  . ACCU-CHEK FASTCLIX LANCETS MISC    Sig: USE AS DIRECTED    Dispense:  102 each    Refill:  12    Follow-up: Return in about 3 months (around 09/12/2018) for follow up of chronic medical conditons.   Charlott Rakes MD

## 2018-06-13 NOTE — Progress Notes (Signed)
Patient is having pain in neck,shouders,elbows and lower back.

## 2018-06-14 ENCOUNTER — Encounter: Payer: Self-pay | Admitting: Family Medicine

## 2018-09-19 ENCOUNTER — Ambulatory Visit: Payer: Medicare HMO | Attending: Family Medicine | Admitting: Family Medicine

## 2018-09-19 ENCOUNTER — Encounter: Payer: Self-pay | Admitting: Family Medicine

## 2018-09-19 ENCOUNTER — Other Ambulatory Visit: Payer: Self-pay

## 2018-09-19 DIAGNOSIS — E1165 Type 2 diabetes mellitus with hyperglycemia: Secondary | ICD-10-CM

## 2018-09-19 DIAGNOSIS — G8929 Other chronic pain: Secondary | ICD-10-CM | POA: Diagnosis not present

## 2018-09-19 DIAGNOSIS — E785 Hyperlipidemia, unspecified: Secondary | ICD-10-CM | POA: Diagnosis not present

## 2018-09-19 DIAGNOSIS — E1169 Type 2 diabetes mellitus with other specified complication: Secondary | ICD-10-CM

## 2018-09-19 DIAGNOSIS — M5010 Cervical disc disorder with radiculopathy, unspecified cervical region: Secondary | ICD-10-CM

## 2018-09-19 DIAGNOSIS — M5416 Radiculopathy, lumbar region: Secondary | ICD-10-CM

## 2018-09-19 MED ORDER — LIDOCAINE 5 % EX PTCH: 1.0000 | MEDICATED_PATCH | CUTANEOUS | 1 refills | Status: DC

## 2018-09-19 MED ORDER — ACETAMINOPHEN-CODEINE #3 300-30 MG PO TABS: 1.0000 | ORAL_TABLET | Freq: Two times a day (BID) | ORAL | 0 refills | Status: DC | PRN

## 2018-09-19 NOTE — Progress Notes (Signed)
Patient has been called and DOB has been verified. Patient has been screened and transferred to PCP to start phone visit.  C/C: Diabetes, Hypertension, back and leg pain.  Refills: none needed.

## 2018-09-19 NOTE — Progress Notes (Signed)
Virtual Visit via Telephone Note  I connected with Jill Anderson, on 09/19/2018 at 1:38 PM by telephone and verified that I am speaking with the correct person using two identifiers.   Consent: I discussed the limitations, risks, security and privacy concerns of performing an evaluation and management service by telephone and the availability of in person appointments. I also discussed with the patient that there may be a patient responsible charge related to this service. The patient expressed understanding and agreed to proceed.   Location of Patient: Patient's home  Location of Provider: Clinic   Persons participating in Telemedicine visit: Sheng Pritz Dr. Halina Andreas    History of Present Illness: Jill Anderson  is a 53 year old female with a history of Type 2 Diabetes Mellitus (A1c 6.8), Hypercholesterolemia, chronic neck and chronic low back pain secondary to degenerative changes of the spine, Insomnia who presents today for follow-up visit. She discontinued tramadol due to itching and has been on ibuprofen and Robaxin but this is not controlling her pain She experienced exacerbation of her chronic neck pain and chronic low back pain yesterday prior to the rain and her low back pain radiates to her coccyx and down both legs bilaterally to the point where she is barely able to walk.  Pain is also present in her legs, shoulders, elbows and application of ice sometimes provides some relief   Her blood sugars have been controlled with random sugars around 127.  Unable to tolerate statin due to myalgias.  She is currently occupied at home with her granddaughter who keeps her busy with her online school- she is in the eighth grade.  Past Medical History:  Diagnosis Date  . Borderline diabetic   . Diabetes mellitus without complication (HCC)    Allergies  Allergen Reactions  . Atorvastatin Other (See Comments)    myalgia    Current Outpatient Medications  on File Prior to Visit  Medication Sig Dispense Refill  . ACCU-CHEK FASTCLIX LANCETS MISC USE AS DIRECTED 102 each 12  . aspirin EC 81 MG tablet Take 1 tablet (81 mg total) by mouth daily. 30 tablet 11  . Blood Glucose Monitoring Suppl (ACCU-CHEK GUIDE ME) w/Device KIT 1 each by Does not apply route daily. 1 kit 0  . cetirizine (ZYRTEC) 10 MG tablet Take 1 tablet (10 mg total) by mouth daily. 30 tablet 1  . Elastic Bandages & Supports (TRUFORM ARM 2 M 15-20MMHG) MISC 1 each by Does not apply route daily. 1 each 0  . ergocalciferol (DRISDOL) 50000 units capsule Take 1 capsule (50,000 Units total) by mouth once a week. 9 capsule 1  . ezetimibe (ZETIA) 10 MG tablet Take 1 tablet (10 mg total) by mouth daily. 30 tablet 6  . gabapentin (NEURONTIN) 300 MG capsule Take 1 capsule (300 mg total) by mouth at bedtime. 30 capsule 6  . glucose blood (ACCU-CHEK GUIDE) test strip Use as instructed 100 each 12  . ibuprofen (ADVIL,MOTRIN) 800 MG tablet Take 1 tablet (800 mg total) by mouth every 12 (twelve) hours as needed. 60 tablet 2  . metFORMIN (GLUCOPHAGE) 500 MG tablet Take 2 tablets (1,000 mg total) by mouth 2 (two) times daily with a meal. 120 tablet 6  . Misc. Devices (CANE) MISC 1 each by Does not apply route as needed. 1 each 0  . traZODone (DESYREL) 100 MG tablet Take 1 tablet (100 mg total) by mouth at bedtime as needed for sleep. 30 tablet 3  . DULoxetine (CYMBALTA)  60 MG capsule Take 1 capsule (60 mg total) by mouth daily. (Patient not taking: Reported on 09/19/2018) 30 capsule 6  . methocarbamol (ROBAXIN) 500 MG tablet Take 1 tablet (500 mg total) by mouth every 8 (eight) hours as needed for muscle spasms. (Patient not taking: Reported on 09/19/2018) 90 tablet 6  . predniSONE (DELTASONE) 20 MG tablet Take 1 tablet (20 mg total) by mouth daily with breakfast. (Patient not taking: Reported on 09/19/2018) 5 tablet 0  . psyllium (METAMUCIL) 58.6 % powder Take 1 packet by mouth 2 (two) times daily.      No current facility-administered medications on file prior to visit.     Observations/Objective: Awake, alert, oriented x3 Not in acute distress  CMP Latest Ref Rng & Units 11/01/2017 05/14/2017 12/14/2016  Glucose 65 - 99 mg/dL 110(H) 115(H) 84  BUN 6 - 24 mg/dL _0 Creatinine 0.57 - 1.00 mg/dL 0.75 0.67 0.75  Sodium 134 - 144 mmol/L 137 140 139  Potassium 3.5 - 5.2 mmol/L 4.7 4.6 4.5  Chloride 96 - 106 mmol/L 100 103 100  CO2 20 - 29 mmol/L _1 Calcium 8.7 - 10.2 mg/dL 9.6 9.7 9.8  Total Protein 6.0 - 8.5 g/dL 7.6 7.5 7.5  Total Bilirubin 0.0 - 1.2 mg/dL 1.0 0.9 1.2  Alkaline Phos 39 - 117 IU/L 58 54 56  AST 0 - 40 IU/L _2 ALT 0 - 32 IU/L _3 Lipid Panel     Component Value Date/Time   CHOL 203 (H) 11/01/2017 1139   TRIG 91 11/01/2017 1139   HDL 49 11/01/2017 1139   CHOLHDL 4.1 11/01/2017 1139   CHOLHDL 4.8 07/12/2015 1008   VLDL 14 07/12/2015 1008   LDLCALC 136 (H) 11/01/2017 1139    Lab Results  Component Value Date   HGBA1C 6.8 06/13/2018     Assessment and Plan: 1. Chronic radicular low back pain Uncontrolled Unable to tolerate tramadol due to pruritus We will try Tylenol 3 and advised to discontinue if unable to tolerate We will also try lidocaine patch in the event that insurance will cover - acetaminophen-codeine (TYLENOL #3) 300-30 MG tablet; Take 1 tablet by mouth every 12 (twelve) hours as needed for moderate pain. Dx: Cervical and lumbar radiculopathy  Dispense: 60 tablet; Refill: 0 - lidocaine (LIDODERM) 5 %; Place 1 patch onto the skin daily. Remove & Discard patch within 12 hours or as directed by MD. Dx: cervical and Lumbar radiculopathy  Dispense: 30 patch; Refill: 1  2. Cervical disc disorder with radiculopathy of cervical region See #1 above - acetaminophen-codeine (TYLENOL #3) 300-30 MG tablet; Take 1 tablet by mouth every 12 (twelve) hours as needed for moderate pain. Dx: Cervical and lumbar radiculopathy  Dispense:  60 tablet; Refill: 0 - lidocaine (LIDODERM) 5 %; Place 1 patch onto the skin daily. Remove & Discard patch within 12 hours or as directed by MD. Dx: cervical and Lumbar radiculopathy  Dispense: 30 patch; Refill: 1  3. Hyperlipidemia associated with type 2 diabetes mellitus (HCC) Uncontrolled Statin intolerance Continue Zetia - Lipid panel; Future  4. Type 2 diabetes mellitus with hyperglycemia, without long-term current use of insulin (HCC) Controlled with A1c of 6.8 Continue current management, diabetic diet and lifestyle modifications - CMP14+EGFR; Future - Microalbumin/Creatinine Ratio, Urine; Future - Hemoglobin A1c; Future   Follow Up Instructions: Return in about 3 months (around 12/19/2018).    I discussed the assessment and treatment plan  with the patient. The patient was provided an opportunity to ask questions and all were answered. The patient agreed with the plan and demonstrated an understanding of the instructions.   The patient was advised to call back or seek an in-person evaluation if the symptoms worsen or if the condition fails to improve as anticipated.     I provided 16 minutes total of non-face-to-face time during this encounter including median intraservice time, reviewing previous notes, labs, imaging, medications and explaining diagnosis and management.     Charlott Rakes, MD, FAAFP. [facility] [place], [place]   09/19/2018, 1:38 PM

## 2018-09-23 ENCOUNTER — Ambulatory Visit: Payer: Medicare HMO | Attending: Family Medicine

## 2018-09-23 ENCOUNTER — Other Ambulatory Visit: Payer: Self-pay

## 2018-09-23 DIAGNOSIS — E1165 Type 2 diabetes mellitus with hyperglycemia: Secondary | ICD-10-CM | POA: Diagnosis not present

## 2018-09-23 DIAGNOSIS — E1169 Type 2 diabetes mellitus with other specified complication: Secondary | ICD-10-CM | POA: Diagnosis not present

## 2018-09-23 DIAGNOSIS — E785 Hyperlipidemia, unspecified: Secondary | ICD-10-CM

## 2018-09-24 LAB — CMP14+EGFR
ALT: 54 IU/L — ABNORMAL HIGH (ref 0–32)
AST: 36 IU/L (ref 0–40)
Albumin/Globulin Ratio: 1.7 (ref 1.2–2.2)
Albumin: 4.8 g/dL (ref 3.8–4.9)
Alkaline Phosphatase: 65 IU/L (ref 39–117)
BUN/Creatinine Ratio: 16 (ref 9–23)
BUN: 12 mg/dL (ref 6–24)
Bilirubin Total: 0.9 mg/dL (ref 0.0–1.2)
CO2: 25 mmol/L (ref 20–29)
Calcium: 9.8 mg/dL (ref 8.7–10.2)
Chloride: 96 mmol/L (ref 96–106)
Creatinine, Ser: 0.77 mg/dL (ref 0.57–1.00)
GFR calc Af Amer: 101 mL/min/{1.73_m2} (ref >59)
GFR calc non Af Amer: 88 mL/min/{1.73_m2} (ref >59)
Globulin, Total: 2.8 g/dL (ref 1.5–4.5)
Glucose: 148 mg/dL — ABNORMAL HIGH (ref 65–99)
Potassium: 4.1 mmol/L (ref 3.5–5.2)
Sodium: 138 mmol/L (ref 134–144)
Total Protein: 7.6 g/dL (ref 6.0–8.5)

## 2018-09-24 LAB — LIPID PANEL
Chol/HDL Ratio: 4.1 ratio (ref 0.0–4.4)
Cholesterol, Total: 219 mg/dL — ABNORMAL HIGH (ref 100–199)
HDL: 53 mg/dL (ref >39)
LDL Calculated: 141 mg/dL — ABNORMAL HIGH (ref 0–99)
Triglycerides: 123 mg/dL (ref 0–149)
VLDL Cholesterol Cal: 25 mg/dL (ref 5–40)

## 2018-09-24 LAB — MICROALBUMIN / CREATININE URINE RATIO
Creatinine, Urine: 259.9 mg/dL
Microalb/Creat Ratio: 4 mg/g creat (ref 0–29)
Microalbumin, Urine: 11.4 ug/mL

## 2018-09-24 LAB — HEMOGLOBIN A1C
Est. average glucose Bld gHb Est-mCnc: 146 mg/dL
Hgb A1c MFr Bld: 6.7 % — ABNORMAL HIGH (ref 4.8–5.6)

## 2018-11-20 ENCOUNTER — Encounter: Payer: Self-pay | Admitting: Family Medicine

## 2018-11-21 ENCOUNTER — Encounter (HOSPITAL_COMMUNITY): Payer: Self-pay | Admitting: *Deleted

## 2018-11-21 ENCOUNTER — Emergency Department (HOSPITAL_COMMUNITY)
Admission: EM | Admit: 2018-11-21 | Discharge: 2018-11-21 | Disposition: A | Discharge status: Home / Self Care | Payer: Medicare HMO | Attending: Emergency Medicine | Admitting: Emergency Medicine

## 2018-11-21 ENCOUNTER — Telehealth: Payer: Medicare HMO

## 2018-11-21 ENCOUNTER — Other Ambulatory Visit: Payer: Self-pay

## 2018-11-21 ENCOUNTER — Ambulatory Visit (INDEPENDENT_AMBULATORY_CARE_PROVIDER_SITE_OTHER)
Admission: EM | Admit: 2018-11-21 | Discharge: 2018-11-21 | Disposition: A | Discharge status: Other Acute Inpatient Hospital | Payer: Medicare HMO | Source: Home / Self Care | Attending: Family Medicine | Admitting: Family Medicine

## 2018-11-21 ENCOUNTER — Inpatient Hospital Stay: Admission: RE | Admit: 2018-11-21 | Payer: Medicare HMO | Source: Ambulatory Visit

## 2018-11-21 ENCOUNTER — Emergency Department (HOSPITAL_COMMUNITY): Payer: Medicare HMO

## 2018-11-21 DIAGNOSIS — E119 Type 2 diabetes mellitus without complications: Secondary | ICD-10-CM | POA: Insufficient documentation

## 2018-11-21 DIAGNOSIS — I10 Essential (primary) hypertension: Secondary | ICD-10-CM | POA: Diagnosis not present

## 2018-11-21 DIAGNOSIS — R1011 Right upper quadrant pain: Secondary | ICD-10-CM | POA: Insufficient documentation

## 2018-11-21 DIAGNOSIS — Z79899 Other long term (current) drug therapy: Secondary | ICD-10-CM | POA: Diagnosis not present

## 2018-11-21 DIAGNOSIS — Z7984 Long term (current) use of oral hypoglycemic drugs: Secondary | ICD-10-CM | POA: Diagnosis not present

## 2018-11-21 DIAGNOSIS — R110 Nausea: Secondary | ICD-10-CM

## 2018-11-21 DIAGNOSIS — Z7982 Long term (current) use of aspirin: Secondary | ICD-10-CM | POA: Insufficient documentation

## 2018-11-21 HISTORY — DX: Pure hypercholesterolemia, unspecified: E78.00

## 2018-11-21 HISTORY — DX: Other chronic pain: G89.29

## 2018-11-21 LAB — COMPREHENSIVE METABOLIC PANEL
ALT: 33 U/L (ref 0–44)
AST: 25 U/L (ref 15–41)
Albumin: 4 g/dL (ref 3.5–5.0)
Alkaline Phosphatase: 57 U/L (ref 38–126)
Anion gap: 9 (ref 5–15)
BUN: 11 mg/dL (ref 6–20)
CO2: 25 mmol/L (ref 22–32)
Calcium: 9.6 mg/dL (ref 8.9–10.3)
Chloride: 105 mmol/L (ref 98–111)
Creatinine, Ser: 0.78 mg/dL (ref 0.44–1.00)
GFR calc Af Amer: >60 mL/min (ref >60)
GFR calc non Af Amer: >60 mL/min (ref >60)
Glucose, Bld: 113 mg/dL — ABNORMAL HIGH (ref 70–99)
Potassium: 4.1 mmol/L (ref 3.5–5.1)
Sodium: 139 mmol/L (ref 135–145)
Total Bilirubin: 1.3 mg/dL — ABNORMAL HIGH (ref 0.3–1.2)
Total Protein: 7.7 g/dL (ref 6.5–8.1)

## 2018-11-21 LAB — URINALYSIS, ROUTINE W REFLEX MICROSCOPIC
Bilirubin Urine: NEGATIVE
Glucose, UA: NEGATIVE mg/dL
Hgb urine dipstick: NEGATIVE
Ketones, ur: NEGATIVE mg/dL
Leukocytes,Ua: NEGATIVE
Nitrite: NEGATIVE
Protein, ur: NEGATIVE mg/dL
Specific Gravity, Urine: 1.023 (ref 1.005–1.030)
pH: 5 (ref 5.0–8.0)

## 2018-11-21 LAB — LIPASE, BLOOD: Lipase: 35 U/L (ref 11–51)

## 2018-11-21 LAB — I-STAT BETA HCG BLOOD, ED (MC, WL, AP ONLY): I-stat hCG, quantitative: <5 m[IU]/mL (ref <5)

## 2018-11-21 LAB — CBC
HCT: 42.7 % (ref 36.0–46.0)
Hemoglobin: 14.1 g/dL (ref 12.0–15.0)
MCH: 30.4 pg (ref 26.0–34.0)
MCHC: 33 g/dL (ref 30.0–36.0)
MCV: 92 fL (ref 80.0–100.0)
Platelets: 302 10*3/uL (ref 150–400)
RBC: 4.64 MIL/uL (ref 3.87–5.11)
RDW: 11.6 % (ref 11.5–15.5)
WBC: 10.6 10*3/uL — ABNORMAL HIGH (ref 4.0–10.5)
nRBC: 0 % (ref 0.0–0.2)

## 2018-11-21 LAB — POCT URINALYSIS DIP (DEVICE)
Glucose, UA: NEGATIVE mg/dL
Hgb urine dipstick: NEGATIVE
Ketones, ur: NEGATIVE mg/dL
Leukocytes,Ua: NEGATIVE
Nitrite: NEGATIVE
Protein, ur: NEGATIVE mg/dL
Specific Gravity, Urine: >=1.03 (ref 1.005–1.030)
Urobilinogen, UA: 0.2 mg/dL (ref 0.0–1.0)
pH: 5.5 (ref 5.0–8.0)

## 2018-11-21 MED ORDER — FAMOTIDINE 20 MG PO TABS: 20.0000 mg | ORAL_TABLET | Freq: Two times a day (BID) | ORAL | 0 refills | Status: DC

## 2018-11-21 MED ORDER — SODIUM CHLORIDE 0.9% FLUSH: 3.0000 mL | Freq: Once | INTRAVENOUS | Status: DC

## 2018-11-21 NOTE — ED Provider Notes (Signed)
[facility] EMERGENCY DEPARTMENT Provider Note   CSN: [id number] Arrival date & time: 11/21/18  1320   History   Chief Complaint Chief Complaint  Patient presents with   Abdominal Pain   Nausea   Emesis   Diarrhea    HPI Jill Anderson is a 53 y.o. female.     HPI    53 year old female presents today with complaints of upper abdominal pain.  Patient notes symptom started proximate 3 days ago with right upper quadrant abdominal pain.  She notes that whenever she eats or drinks anything she has pain in her upper abdomen.  She notes this radiates down to her mid right abdomen, denies any lower abdominal pain.  She denies any vaginal bleeding, dysuria or urinary changes.  She notes she does have a history of gallstones.  No significant abdominal surgeries other than hysterectomy.  She denies any fever or vomiting, denies any diarrhea.   Past Medical History:  Diagnosis Date   Borderline diabetic    Chronic back pain    Diabetes mellitus without complication [facility])    Hypercholesteremia     Patient Active Problem List   Diagnosis Date Noted   Vitamin D deficiency 09/21/2017   Insomnia 05/14/2017   Menopausal symptoms 01/04/2017   Urticaria 12/14/2016   Pain in both hands 12/14/2016   Hypertension 10/27/2016   Bilateral anterior knee pain 03/20/2016   Left ureteral stone 10/27/2015   New onset of headaches after age 5 10/07/2015   Hyperlipidemia associated with type 2 diabetes mellitus ([place]) 07/15/2015   DM2 (diabetes mellitus, type 2) ([place]) 03/04/2015   Chronic neck pain 09/11/2014   Cervical disc disorder with radiculopathy of cervical region 09/11/2014   Chronic radicular low back pain 09/11/2014    Past Surgical History:  Procedure Laterality Date   ABDOMINAL HYSTERECTOMY     BACK SURGERY     CYSTOSCOPY WITH RETROGRADE PYELOGRAM, URETEROSCOPY AND STENT PLACEMENT Left 10/28/2015   Procedure: CYSTOSCOPY WITH RETROGRADE  PYELOGRAM, URETEROSCOPY AND STENT PLACEMENT, AND LASER;  Surgeon: Irine Seal, MD;  Location: [facility];  Service: Urology;  Laterality: Left;     OB History   No obstetric history on file.      Home Medications    Prior to Admission medications   Medication Sig Start Date End Date Taking? Authorizing Provider  ACCU-CHEK FASTCLIX LANCETS MISC USE AS DIRECTED 06/13/18   Charlott Rakes, MD  acetaminophen-codeine (TYLENOL #3) 300-30 MG tablet Take 1 tablet by mouth every 12 (twelve) hours as needed for moderate pain. Dx: Cervical and lumbar radiculopathy 09/19/18   Charlott Rakes, MD  aspirin EC 81 MG tablet Take 1 tablet (81 mg total) by mouth daily. 09/29/16   Funches, Adriana Mccallum, MD  Blood Glucose Monitoring Suppl (ACCU-CHEK GUIDE ME) w/Device KIT 1 each by Does not apply route daily. 06/13/18   Charlott Rakes, MD  cetirizine (ZYRTEC) 10 MG tablet Take 1 tablet (10 mg total) by mouth daily. 12/13/17   Charlott Rakes, MD  DULoxetine (CYMBALTA) 60 MG capsule Take 1 capsule (60 mg total) by mouth daily. 06/13/18   Charlott Rakes, MD  Elastic Bandages & Supports ([place] M 15-20MMHG) MISC 1 each by Does not apply route daily. 03/20/16   Funches, Adriana Mccallum, MD  ergocalciferol (DRISDOL) 50000 units capsule Take 1 capsule (50,000 Units total) by mouth once a week. 08/13/17   Charlott Rakes, MD  ezetimibe (ZETIA) 10 MG tablet Take 1 tablet (10 mg total) by mouth daily. 06/13/18  Charlott Rakes, MD  gabapentin (NEURONTIN) 300 MG capsule Take 1 capsule (300 mg total) by mouth at bedtime. 06/13/18   Charlott Rakes, MD  glucose blood (ACCU-CHEK GUIDE) test strip Use as instructed 06/13/18   Charlott Rakes, MD  ibuprofen (ADVIL,MOTRIN) 800 MG tablet Take 1 tablet (800 mg total) by mouth every 12 (twelve) hours as needed. 03/15/18   Charlott Rakes, MD  lidocaine (LIDODERM) 5 % Place 1 patch onto the skin daily. Remove & Discard patch within 12 hours or as directed by MD. Dx: cervical and Lumbar  radiculopathy 09/19/18   Charlott Rakes, MD  metFORMIN (GLUCOPHAGE) 500 MG tablet Take 2 tablets (1,000 mg total) by mouth 2 (two) times daily with a meal. 06/13/18   Charlott Rakes, MD  methocarbamol (ROBAXIN) 500 MG tablet Take 1 tablet (500 mg total) by mouth every 8 (eight) hours as needed for muscle spasms. Patient not taking: Reported on 09/19/2018 06/13/18   Charlott Rakes, MD  Misc. Devices (CANE) MISC 1 each by Does not apply route as needed. 03/04/15   Funches, Adriana Mccallum, MD  predniSONE (DELTASONE) 20 MG tablet Take 1 tablet (20 mg total) by mouth daily with breakfast. Patient not taking: Reported on 09/19/2018 03/15/18   Charlott Rakes, MD  psyllium (METAMUCIL) 58.6 % powder Take 1 packet by mouth 2 (two) times daily.    [provider]  traZODone (DESYREL) 100 MG tablet Take 1 tablet (100 mg total) by mouth at bedtime as needed for sleep. 03/15/18   Charlott Rakes, MD    Family History Family History  Problem Relation Age of Onset   Diabetes Mother    Hypertension Mother    Heart disease Mother        based at age 64 of MI   Diabetes Father    Hypertension Father    Heart disease Father    Cancer Maternal Aunt        liver    Social History Social History   Tobacco Use   Smoking status: Never Smoker   Smokeless tobacco: Never Used  Substance Use Topics   Alcohol use: No   Drug use: No     Allergies   Atorvastatin   Review of Systems Review of Systems  All other systems reviewed and are negative.    Physical Exam Updated Vital Signs BP (!) 142/83 (BP Location: Left Arm)    Pulse 92    Temp 98.9 F (37.2 C) (Oral)    Resp 16    Ht '5\' 2"'$  (1.575 m)    Wt 77.1 kg    SpO2 99%    BMI 31.09 kg/m   Physical Exam Vitals signs and nursing note reviewed.  Constitutional:      Appearance: She is well-developed.  HENT:     Head: Normocephalic and atraumatic.  Eyes:     General: No scleral icterus.       Right eye: No discharge.        Left  eye: No discharge.     Conjunctiva/sclera: Conjunctivae normal.     Pupils: Pupils are equal, round, and reactive to light.  Neck:     Musculoskeletal: Normal range of motion.     Vascular: No JVD.     Trachea: No tracheal deviation.  Pulmonary:     Effort: Pulmonary effort is normal.     Breath sounds: No stridor.  Abdominal:     Comments: Tenderness palpation of right upper quadrant remainder abdomen soft nontender with no rebound guarding  or masses  Neurological:     Mental Status: She is alert and oriented to person, place, and time.     Coordination: Coordination normal.  Psychiatric:        Behavior: Behavior normal.        Thought Content: Thought content normal.        Judgment: Judgment normal.      ED Treatments / Results  Labs (all labs ordered are listed, but only abnormal results are displayed) Labs Reviewed  COMPREHENSIVE METABOLIC PANEL - Abnormal; Notable for the following components:      Result Value   Glucose, Bld 113 (*)    Total Bilirubin 1.3 (*)    All other components within normal limits  CBC - Abnormal; Notable for the following components:   WBC 10.6 (*)    All other components within normal limits  LIPASE, BLOOD  URINALYSIS, ROUTINE W REFLEX MICROSCOPIC  I-STAT BETA HCG BLOOD, ED ([facility], [facility], [facility] ONLY)    EKG None  Radiology US Abdomen Limited Ruq  Result Date: 11/21/2018 CLINICAL DATA:  RIGHT upper quadrant abdominal pain for 3 days EXAM: ULTRASOUND ABDOMEN LIMITED RIGHT UPPER QUADRANT COMPARISON:  CT abdomen pelvis 10/13/2015 FINDINGS: Gallbladder: Normally distended without stones or wall thickening. No pericholecystic fluid or sonographic Murphy sign. Common bile duct: Diameter: Normal caliber 3 mm diameter Liver: Echogenic parenchyma, likely fatty infiltration though this can be seen with cirrhosis and certain infiltrative disorders. Area of probable focal sparing adjacent to gallbladder fossa. No discrete hepatic mass lesion. Smooth hepatic  contour without nodularity. Portal vein is patent on color Doppler imaging with normal direction of blood flow towards the liver. No RIGHT upper quadrant free fluid. IMPRESSION: Probable fatty infiltration of liver with focal sparing adjacent to gallbladder fossa. Otherwise negative exam. Electronically Signed   By: Lavonia Dana M.D.   On: 11/21/2018 17:38    Procedures Procedures (including critical care time)  Medications Ordered in ED Medications  sodium chloride flush (NS) 0.9 % injection 3 mL (has no administration in time range)     Initial Impression / Assessment and Plan / ED Course  I have reviewed the triage vital signs and the nursing notes.  Pertinent labs & imaging results that were available during my care of the patient were reviewed by me and considered in my medical decision making (see chart for details).       53 year old female presents today with complaints of right upper quadrant abdominal pain.  Her labs are reassuring, ultrasound shows, probable fatty infiltration of liver, no other acute abnormalities.  No signs of acute cholecystitis.  Patient with no other signs or symptoms of acute intra-abdominal pathology.  Patient referred to gastroenterology for ongoing symptoms, strict return precautions given.  She verbalized understanding and agreement to today's plan had no further questions or concerns at time discharge.  Final Clinical Impressions(s) / ED Diagnoses   Final diagnoses:  RUQ abdominal pain    ED Discharge Orders    None       Okey Regal, PA-C 11/21/18 1801    Sherwood Gambler, MD 11/21/18 [phone]

## 2018-11-21 NOTE — ED Triage Notes (Signed)
Pt. Stated, Ive had stomach burning, N/V/D  Started 3 days ago.

## 2018-11-21 NOTE — ED Provider Notes (Signed)
[facility]   [id number] 11/21/18 Arrival Time: [place]:  1. Right upper quadrant abdominal pain   2. Nausea without vomiting    Given pain reported, she elects ED evaluation/imaging for biliary colic symptoms. Prefers private vehicle. Stable upon discharge.  Follow-up Information    Go to  [facility].   Specialty: Emergency Medicine Contact information: [address] [id number] [place] [phone]         Reviewed expectations re: course of current medical issues. Questions answered. Outlined signs and symptoms indicating need for more acute intervention. Patient verbalized understanding. After Visit Summary given.   SUBJECTIVE:   History from: patient. Jill Anderson is a 53 y.o. female who presents with complaint of fairly persistent upper mid and upper right abdominal pain. Onset abrupt, 3 d ago. Discomfort described as aching and with occasional sharp exacerbation; without radiation. Symptoms are gradually worsening since beginning. Fever: absent. Aggravating factors: include PO intake. Alleviating factors: have not been identified. Associated symptoms: nausea without emesis; headaches; occasional loose stools without blood. She denies arthralgias, belching, chills, myalgias and sweats. Appetite: decreased. PO intake: decreased. Ambulatory without assistance. Urinary symptoms: none. History of similar: reports h/o gallstones; "had to have some type of surgery to remove one"; no cholecystectomy. OTC treatment: none reported.  No LMP recorded. Patient has had a hysterectomy.   Past Surgical History:  Procedure Laterality Date  . ABDOMINAL HYSTERECTOMY    . BACK SURGERY    . CYSTOSCOPY WITH RETROGRADE PYELOGRAM, URETEROSCOPY AND STENT PLACEMENT Left 10/28/2015   Procedure: CYSTOSCOPY WITH RETROGRADE PYELOGRAM, URETEROSCOPY AND STENT PLACEMENT, AND LASER;  Surgeon: Irine Seal, MD;  Location: [facility];  Service: Urology;  Laterality: Left;   ROS: As per HPI. All other systems negative.  OBJECTIVE:  Vitals:   11/21/18 1240  BP: 131/73  Pulse: 98  Resp: 16  Temp: 99.1 F (37.3 C)  TempSrc: Oral  SpO2: 98%    General appearance: alert, oriented, no acute distress  HEENT: oropharynx moist Lungs: clear to auscultation bilaterally; unlabored respirations Heart: regular rate and rhythm Abdomen: soft; without distention; mild tenderness over RUQ; normal bowel sounds; without masses or organomegaly; without guarding or rebound tenderness Back: without CVA tenderness; FROM at waist Extremities: without LE edema; symmetrical; without gross deformities Skin: warm and dry Neurologic: normal gait Psychological: alert and cooperative; normal mood and affect   Allergies  Allergen Reactions  . Atorvastatin Other (See Comments)    myalgia                                               Past Medical History:  Diagnosis Date  . Borderline diabetic   . Chronic back pain   . Diabetes mellitus without complication ([place])   . Hypercholesteremia    Social History   Socioeconomic History  . Marital status: Married    Spouse name: Not on file  . Number of children: Not on file  . Years of education: Not on file  . Highest education level: Not on file  Occupational History  . Occupation: Housewife  Social Needs  . Financial resource strain: Not on file  . Food insecurity    Worry: Not on file    Inability: Not on file  . Transportation needs    Medical: Not on file  Non-medical: Not on file  Tobacco Use  . Smoking status: Never Smoker  . Smokeless tobacco: Never Used  Substance and Sexual Activity  . Alcohol use: No  . Drug use: No  . Sexual activity: Not Currently  Lifestyle  . Physical activity    Days per week: Not on file    Minutes per session: Not on file  . Stress: Not on file  Relationships  . Social Musicianconnections    Talks on phone:  Not on file    Gets together: Not on file    Attends religious service: Not on file    Active member of club or organization: Not on file    Attends meetings of clubs or organizations: Not on file    Relationship status: Not on file  . Intimate partner violence    Fear of current or ex partner: Not on file    Emotionally abused: Not on file    Physically abused: Not on file    Forced sexual activity: Not on file  Other Topics Concern  . Not on file  Social History Narrative  . Not on file   Family History  Problem Relation Age of Onset  . Diabetes Mother   . Hypertension Mother   . Heart disease Mother        based at age 54 of MI  . Diabetes Father   . Hypertension Father   . Heart disease Father   . Cancer Maternal Aunt        liver     Mardella LaymanHagler, Maria Coin, MD 11/21/18 1314

## 2018-11-21 NOTE — ED Notes (Signed)
Pt transported to US

## 2018-11-21 NOTE — Discharge Instructions (Addendum)
Please read attached information. If you experience any new or worsening signs or symptoms please return to the emergency room for evaluation. Please follow-up with your primary care provider or specialist as discussed.  °

## 2018-11-21 NOTE — ED Triage Notes (Signed)
C/O epigastric, RUQ & RLQ abd pain with diarrhea, HA.  C/O nausea with any PO intake.  Denies fevers.

## 2018-12-12 ENCOUNTER — Ambulatory Visit: Payer: Medicare HMO | Attending: Family Medicine | Admitting: Family Medicine

## 2018-12-12 ENCOUNTER — Other Ambulatory Visit: Payer: Self-pay

## 2018-12-12 ENCOUNTER — Encounter: Payer: Self-pay | Admitting: Family Medicine

## 2018-12-12 DIAGNOSIS — E1169 Type 2 diabetes mellitus with other specified complication: Secondary | ICD-10-CM

## 2018-12-12 DIAGNOSIS — M5010 Cervical disc disorder with radiculopathy, unspecified cervical region: Secondary | ICD-10-CM

## 2018-12-12 DIAGNOSIS — M791 Myalgia, unspecified site: Secondary | ICD-10-CM

## 2018-12-12 DIAGNOSIS — M5416 Radiculopathy, lumbar region: Secondary | ICD-10-CM | POA: Diagnosis not present

## 2018-12-12 DIAGNOSIS — T466X5A Adverse effect of antihyperlipidemic and antiarteriosclerotic drugs, initial encounter: Secondary | ICD-10-CM

## 2018-12-12 DIAGNOSIS — G8929 Other chronic pain: Secondary | ICD-10-CM

## 2018-12-12 DIAGNOSIS — Z789 Other specified health status: Secondary | ICD-10-CM | POA: Insufficient documentation

## 2018-12-12 DIAGNOSIS — M25512 Pain in left shoulder: Secondary | ICD-10-CM

## 2018-12-12 MED ORDER — PREDNISONE 20 MG PO TABS: 20.0000 mg | ORAL_TABLET | Freq: Every day | ORAL | 0 refills | Status: DC

## 2018-12-12 MED ORDER — METFORMIN HCL 500 MG PO TABS: 1000.0000 mg | ORAL_TABLET | Freq: Two times a day (BID) | ORAL | 6 refills | Status: DC

## 2018-12-12 MED ORDER — METHOCARBAMOL 500 MG PO TABS: 500.0000 mg | ORAL_TABLET | Freq: Three times a day (TID) | ORAL | 6 refills | Status: DC | PRN

## 2018-12-12 MED ORDER — ACETAMINOPHEN-CODEINE #3 300-30 MG PO TABS: 1.0000 | ORAL_TABLET | Freq: Two times a day (BID) | ORAL | 0 refills | Status: DC | PRN

## 2018-12-12 MED ORDER — GABAPENTIN 300 MG PO CAPS: 300.0000 mg | ORAL_CAPSULE | Freq: Every day | ORAL | 6 refills | Status: DC

## 2018-12-12 NOTE — Progress Notes (Signed)
Virtual Visit via Telephone Note  I connected with Jill Anderson, on 12/12/2018 at 8:34 AM by telephone due to the COVID-19 pandemic and verified that I am speaking with the correct person using two identifiers.   Consent: I discussed the limitations, risks, security and privacy concerns of performing an evaluation and management service by telephone and the availability of in person appointments. I also discussed with the patient that there may be a patient responsible charge related to this service. The patient expressed understanding and agreed to proceed.   Location of Patient: Home   Location of Provider: Clinic   Persons participating in Telemedicine visit: Jill Anderson-CMA Dr. Felecia Shelling     History of Present Illness: Jill Anderson  is a 53 year old female with a history of Type 2 Diabetes Mellitus (A1c 6.8), Hypercholesterolemia, chronic neck and chronic low back pain secondary to degenerative changes of the spine, Insomnia who presents today for follow-up visit. Her back pain is rated as a 10/10 and has worsened lately with increasing pain when she moves and she finds herself having to need support on getting up from a sitting position to prevent falling.  She uses Tylenol 3 sparingly to prevent getting addicted.  Unable to tolerate Cymbalta as it made her dizzy, anxious, depressed, have chills.  Currently on gabapentin and also Robaxin; use of CBD oil helps and ice provides more relief than heat. Physical therapy was ineffective and in addition she has to be $40 co-pay for her visits which is tough as she is on a fixed income;she declined surgery as she feels that is a temporary fix; was informed by the [facility] surgeon that epidural spinal injections will be effective.  With regard to her diabetes mellitus her sugars have been good and fasting sugars have been under 100.  She denies hypoglycemia or visual concerns. Unable to tolerate statin due to myalgia and  unable to tolerate Zetia due to same; currently uses herbal life which she feels has been beneficial.   Past Medical History:  Diagnosis Date  . Borderline diabetic   . Chronic back pain   . Diabetes mellitus without complication ([place])   . Hypercholesteremia    Allergies  Allergen Reactions  . Atorvastatin Other (See Comments)    myalgia    Current Outpatient Medications on File Prior to Visit  Medication Sig Dispense Refill  . ACCU-CHEK FASTCLIX LANCETS MISC USE AS DIRECTED 102 each 12  . acetaminophen-codeine (TYLENOL #3) 300-30 MG tablet Take 1 tablet by mouth every 12 (twelve) hours as needed for moderate pain. Dx: Cervical and lumbar radiculopathy 60 tablet 0  . aspirin EC 81 MG tablet Take 1 tablet (81 mg total) by mouth daily. 30 tablet 11  . Blood Glucose Monitoring Suppl (ACCU-CHEK GUIDE ME) w/Device KIT 1 each by Does not apply route daily. 1 kit 0  . cetirizine (ZYRTEC) 10 MG tablet Take 1 tablet (10 mg total) by mouth daily. 30 tablet 1  . Elastic Bandages & Supports (TRUFORM ARM 58 M 15-20MMHG) MISC 1 each by Does not apply route daily. 1 each 0  . ergocalciferol (DRISDOL) 50000 units capsule Take 1 capsule (50,000 Units total) by mouth once a week. 9 capsule 1  . famotidine (PEPCID) 20 MG tablet Take 1 tablet (20 mg total) by mouth 2 (two) times daily. 30 tablet 0  . gabapentin (NEURONTIN) 300 MG capsule Take 1 capsule (300 mg total) by mouth at bedtime. 30 capsule 6  . glucose blood (ACCU-CHEK  GUIDE) test strip Use as instructed 100 each 12  . ibuprofen (ADVIL,MOTRIN) 800 MG tablet Take 1 tablet (800 mg total) by mouth every 12 (twelve) hours as needed. 60 tablet 2  . metFORMIN (GLUCOPHAGE) 500 MG tablet Take 2 tablets (1,000 mg total) by mouth 2 (two) times daily with a meal. 120 tablet 6  . methocarbamol (ROBAXIN) 500 MG tablet Take 1 tablet (500 mg total) by mouth every 8 (eight) hours as needed for muscle spasms. 90 tablet 6  . Misc. Devices (CANE) MISC 1 each by  Does not apply route as needed. 1 each 0  . psyllium (METAMUCIL) 58.6 % powder Take 1 packet by mouth 2 (two) times daily.    . DULoxetine (CYMBALTA) 60 MG capsule Take 1 capsule (60 mg total) by mouth daily. (Patient not taking: Reported on 12/12/2018) 30 capsule 6  . ezetimibe (ZETIA) 10 MG tablet Take 1 tablet (10 mg total) by mouth daily. (Patient not taking: Reported on 12/12/2018) 30 tablet 6  . lidocaine (LIDODERM) 5 % Place 1 patch onto the skin daily. Remove & Discard patch within 12 hours or as directed by MD. Dx: cervical and Lumbar radiculopathy 30 patch 1  . predniSONE (DELTASONE) 20 MG tablet Take 1 tablet (20 mg total) by mouth daily with breakfast. (Patient not taking: Reported on 09/19/2018) 5 tablet 0  . traZODone (DESYREL) 100 MG tablet Take 1 tablet (100 mg total) by mouth at bedtime as needed for sleep. (Patient not taking: Reported on 12/12/2018) 30 tablet 3   No current facility-administered medications on file prior to visit.     Observations/Objective: Awake, alert, oriented x3 Not in acute distress  CMP Latest Ref Rng & Units 11/21/2018 09/23/2018 11/01/2017  Glucose 70 - 99 mg/dL 113(H) 148(H) 110(H)  BUN 6 - 20 mg/dL 11 12 10   Creatinine 0.44 - 1.00 mg/dL 0.78 0.77 0.75  Sodium 135 - 145 mmol/L 139 138 137  Potassium 3.5 - 5.1 mmol/L 4.1 4.1 4.7  Chloride 98 - 111 mmol/L 105 96 100  CO2 22 - 32 mmol/L 25 25 24   Calcium 8.9 - 10.3 mg/dL 9.6 9.8 9.6  Total Protein 6.5 - 8.1 g/dL 7.7 7.6 7.6  Total Bilirubin 0.3 - 1.2 mg/dL 1.3(H) 0.9 1.0  Alkaline Phos 38 - 126 U/L 57 65 58  AST 15 - 41 U/L 25 36 22  ALT 0 - 44 U/L 33 54(H) 25    Lipid Panel     Component Value Date/Time   CHOL 219 (H) 09/23/2018 0926   TRIG 123 09/23/2018 0926   HDL 53 09/23/2018 0926   CHOLHDL 4.1 09/23/2018 0926   CHOLHDL 4.8 07/12/2015 1008   VLDL 14 07/12/2015 1008   LDLCALC 141 (H) 09/23/2018 0926    Lab Results  Component Value Date   HGBA1C 6.7 (H) 09/23/2018    Assessment and  Plan: 1. Type 2 diabetes mellitus with other specified complication, without long-term current use of insulin (HCC) Controlled with A1c of 6.7 Counseled on Diabetic diet, my plate method, 767 minutes of moderate intensity exercise/week Keep blood sugar logs with fasting goals of 80-120 mg/dl, random of less than 180 and in the event of sugars less than 60 mg/dl or greater than 400 mg/dl please notify the clinic ASAP. It is recommended that you undergo annual eye exams and annual foot exams. Pneumonia vaccine is recommended. - metFORMIN (GLUCOPHAGE) 500 MG tablet; Take 2 tablets (1,000 mg total) by mouth 2 (two) times daily with a  meal.  Dispense: 120 tablet; Refill: 6  2. Cervical disc disorder with radiculopathy of cervical region Uncontrolled Counseled on home exercise regimens, yoga Declines surgery Using Tylenol No. 3 sparingly She will reach out to the clinic in the event that she decides to go for PT - gabapentin (NEURONTIN) 300 MG capsule; Take 1 capsule (300 mg total) by mouth at bedtime.  Dispense: 30 capsule; Refill: 6 - predniSONE (DELTASONE) 20 MG tablet; Take 1 tablet (20 mg total) by mouth daily with breakfast.  Dispense: 5 tablet; Refill: 0 - methocarbamol (ROBAXIN) 500 MG tablet; Take 1 tablet (500 mg total) by mouth every 8 (eight) hours as needed for muscle spasms.  Dispense: 90 tablet; Refill: 6 - acetaminophen-codeine (TYLENOL #3) 300-30 MG tablet; Take 1 tablet by mouth every 12 (twelve) hours as needed for moderate pain. Dx: Cervical and lumbar radiculopathy  Dispense: 60 tablet; Refill: 0   3. Myalgia due to statin Unable to tolerate statin due to myalgia Unable to tolerate Zetia due to myalgia  Counseled on low-cholesterol diet, lifestyle modifications  4. Chronic radicular low back pain See #2 above - predniSONE (DELTASONE) 20 MG tablet; Take 1 tablet (20 mg total) by mouth daily with breakfast.  Dispense: 5 tablet; Refill: 0 - acetaminophen-codeine (TYLENOL #3)  300-30 MG tablet; Take 1 tablet by mouth every 12 (twelve) hours as needed for moderate pain. Dx: Cervical and lumbar radiculopathy  Dispense: 60 tablet; Refill: 0   Follow Up Instructions: 3 months   I discussed the assessment and treatment plan with the patient. The patient was provided an opportunity to ask questions and all were answered. The patient agreed with the plan and demonstrated an understanding of the instructions.   The patient was advised to call back or seek an in-person evaluation if the symptoms worsen or if the condition fails to improve as anticipated.     I provided 16 minutes total of non-face-to-face time during this encounter including median intraservice time, reviewing previous notes, labs, imaging, medications, management and patient verbalized understanding.     Charlott Rakes, MD, FAAFP. [facility] and [facility], [place]   12/12/2018, 8:34 AM

## 2018-12-12 NOTE — Progress Notes (Signed)
Patient has been called and DOB has been verified. Patient has been screened and transferred to PCP to start phone visit.   Patient is having back and neck pain.

## 2018-12-20 ENCOUNTER — Ambulatory Visit: Payer: Medicare HMO | Admitting: Family Medicine

## 2019-02-15 ENCOUNTER — Ambulatory Visit
Admission: EM | Admit: 2019-02-15 | Discharge: 2019-02-15 | Disposition: A | Discharge status: Home / Self Care | Payer: Medicare HMO | Attending: Physician Assistant | Admitting: Physician Assistant

## 2019-02-15 ENCOUNTER — Other Ambulatory Visit: Payer: Self-pay

## 2019-02-15 DIAGNOSIS — R519 Headache, unspecified: Secondary | ICD-10-CM

## 2019-02-15 DIAGNOSIS — R51 Headache: Secondary | ICD-10-CM

## 2019-02-15 MED ORDER — KETOROLAC TROMETHAMINE 30 MG/ML IJ SOLN
30.0000 mg | Freq: Once | INTRAMUSCULAR | Status: AC
  Administered 2019-02-15: 30 mg via INTRAMUSCULAR

## 2019-02-15 MED ORDER — DEXAMETHASONE SODIUM PHOSPHATE 10 MG/ML IJ SOLN
10.0000 mg | Freq: Once | INTRAMUSCULAR | Status: AC
  Administered 2019-02-15: 10 mg via INTRAMUSCULAR

## 2019-02-15 MED ORDER — METOCLOPRAMIDE HCL 5 MG/ML IJ SOLN
5.0000 mg | Freq: Once | INTRAMUSCULAR | Status: AC
  Administered 2019-02-15: 5 mg via INTRAMUSCULAR

## 2019-02-15 NOTE — Discharge Instructions (Signed)
We have drawn labs to evaluate for your blood count, liver function, kidney function, and inflammatory factors, this will help neurologist determine if Giant cell arteritis (temporal arteritis) is what is causing your symptoms. You were treated with decadron, toradol, reglan today to help with headaches. If any worsening of symptoms, changes in vision, dizziness, one sided weakness, passing out, fever, go to the ED for further evaluation needed.

## 2019-02-15 NOTE — ED Provider Notes (Signed)
[facility]    CSN: [id number] Arrival date & time: 02/15/19  1603      History   Chief Complaint No chief complaint on file.   HPI Jill Anderson is a 53 y.o. female.    53 yo female with hx of DM presents with history of headache x 4 days without recent trauma or fall. Reports headache come on suddenly in the middle of the night, awakening her from sleep. Pain was initially in the back on her head, to the left side. Shortly after, pain moved to left side of her face and left occiput/temperal area. She reports the pain as significant with intermittent excruiating pains that are felt as pressure on the left side of her face and head. She is no longer having pain in the back of her head but reports tenderness of her scalp on the left side. The headache will be present for 3-4 hours then she will have 1 hour of relief. She has had migraines in the past, but reports this feels much different. Reports photophobia and phonophobia. Reports pain in jaw on left side with chewing. Denies vision changes, dizziness, or syncope. Denies fever, congestion, cough. Denies sore throat. Denies one sided weakness. Reports runny nose that is typical for her. She as tried Afrin for the last 3 days before bed with short term relief, enough to fall asleep. Has tried ibuprofen without relief. Denies worse headache of her life.      Past Medical History:  Diagnosis Date  . Borderline diabetic   . Chronic back pain   . Diabetes mellitus without complication ([place])   . Hypercholesteremia     Patient Active Problem List   Diagnosis Date Noted  . Myalgia due to statin 12/12/2018  . Vitamin D deficiency 09/21/2017  . Insomnia 05/14/2017  . Menopausal symptoms 01/04/2017  . Urticaria 12/14/2016  . Pain in both hands 12/14/2016  . Hypertension 10/27/2016  . Bilateral anterior knee pain 03/20/2016  . Left ureteral stone 10/27/2015  . New onset of headaches after age 56 10/07/2015  .  Hyperlipidemia associated with type 2 diabetes mellitus ([place]) 07/15/2015  . DM2 (diabetes mellitus, type 2) ([place]) 03/04/2015  . Chronic neck pain 09/11/2014  . Cervical disc disorder with radiculopathy of cervical region 09/11/2014  . Chronic radicular low back pain 09/11/2014    Past Surgical History:  Procedure Laterality Date  . ABDOMINAL HYSTERECTOMY    . BACK SURGERY    . CYSTOSCOPY WITH RETROGRADE PYELOGRAM, URETEROSCOPY AND STENT PLACEMENT Left 10/28/2015   Procedure: CYSTOSCOPY WITH RETROGRADE PYELOGRAM, URETEROSCOPY AND STENT PLACEMENT, AND LASER;  Surgeon: Irine Seal, MD;  Location: [facility];  Service: Urology;  Laterality: Left;    OB History   No obstetric history on file.      Home Medications    Prior to Admission medications   Medication Sig Start Date End Date Taking? Authorizing Provider  ACCU-CHEK FASTCLIX LANCETS MISC USE AS DIRECTED 06/13/18   Charlott Rakes, MD  acetaminophen-codeine (TYLENOL #3) 300-30 MG tablet Take 1 tablet by mouth every 12 (twelve) hours as needed for moderate pain. Dx: Cervical and lumbar radiculopathy 12/12/18   Charlott Rakes, MD  aspirin EC 81 MG tablet Take 1 tablet (81 mg total) by mouth daily. 09/29/16   Funches, Adriana Mccallum, MD  Blood Glucose Monitoring Suppl (ACCU-CHEK GUIDE ME) w/Device KIT 1 each by Does not apply route daily. 06/13/18   Charlott Rakes, MD  cetirizine (ZYRTEC) 10 MG tablet Take 1 tablet (10  mg total) by mouth daily. 12/13/17   Charlott Rakes, MD  Elastic Bandages & Supports ([place] M 15-20MMHG) MISC 1 each by Does not apply route daily. 03/20/16   Funches, Adriana Mccallum, MD  ergocalciferol (DRISDOL) 50000 units capsule Take 1 capsule (50,000 Units total) by mouth once a week. 08/13/17   Charlott Rakes, MD  famotidine (PEPCID) 20 MG tablet Take 1 tablet (20 mg total) by mouth 2 (two) times daily. 11/21/18   Hedges, Dellis Filbert, PA-C  gabapentin (NEURONTIN) 300 MG capsule Take 1 capsule (300 mg total) by mouth at bedtime.  12/12/18   Charlott Rakes, MD  glucose blood (ACCU-CHEK GUIDE) test strip Use as instructed 06/13/18   Charlott Rakes, MD  ibuprofen (ADVIL,MOTRIN) 800 MG tablet Take 1 tablet (800 mg total) by mouth every 12 (twelve) hours as needed. 03/15/18   Charlott Rakes, MD  lidocaine (LIDODERM) 5 % Place 1 patch onto the skin daily. Remove & Discard patch within 12 hours or as directed by MD. Dx: cervical and Lumbar radiculopathy 09/19/18   Charlott Rakes, MD  metFORMIN (GLUCOPHAGE) 500 MG tablet Take 2 tablets (1,000 mg total) by mouth 2 (two) times daily with a meal. 12/12/18   Charlott Rakes, MD  Misc. Devices (CANE) MISC 1 each by Does not apply route as needed. 03/04/15   Funches, Adriana Mccallum, MD  psyllium (METAMUCIL) 58.6 % powder Take 1 packet by mouth 2 (two) times daily.    [provider]    Family History Family History  Problem Relation Age of Onset  . Diabetes Mother   . Hypertension Mother   . Heart disease Mother        based at age 44 of MI  . Diabetes Father   . Hypertension Father   . Heart disease Father   . Cancer Maternal Aunt        liver    Social History Social History   Tobacco Use  . Smoking status: Never Smoker  . Smokeless tobacco: Never Used  Substance Use Topics  . Alcohol use: No  . Drug use: No     Allergies   Atorvastatin   Review of Systems Review of Systems  See HPI.    Physical Exam Triage Vital Signs ED Triage Vitals  Enc Vitals Group     BP 02/15/19 1624 (!) 152/82     Pulse Rate 02/15/19 1624 71     Resp 02/15/19 1624 16     Temp 02/15/19 1624 98.1 F (36.7 C)     Temp Source 02/15/19 1624 Oral     SpO2 02/15/19 1624 97 %     Weight --      Height --      Head Circumference --      Peak Flow --      Pain Score 02/15/19 1627 8     Pain Loc --      Pain Edu? --      Excl. in [place]? --    No data found.  Updated Vital Signs BP (!) 152/82 (BP Location: Left Arm)   Pulse 71   Temp 98.1 F (36.7 C) (Oral)   Resp 16    SpO2 97%   Physical Exam Constitutional:      General: She is not in acute distress.    Appearance: Normal appearance. She is not ill-appearing, toxic-appearing or diaphoretic.  HENT:     Head: Normocephalic and atraumatic. No masses.     Comments: No erythema, abrasion, or lesion.  No warmth. No swelling. Tenderness to palpation of left side of scalp. Tenderness to palpation at left temple. Tenderness to palpation of left jaw. No trismus. Sensation intact.     Right Ear: Tympanic membrane, ear canal and external ear normal.     Left Ear: Tympanic membrane, ear canal and external ear normal.     Nose: Nose normal.     Mouth/Throat:     Mouth: Mucous membranes are moist.     Pharynx: Oropharynx is clear. No oropharyngeal exudate.  Eyes:     General: Lids are normal. Vision grossly intact. Gaze aligned appropriately. No visual field deficit.    Extraocular Movements: Extraocular movements intact.     Right eye: No nystagmus.     Left eye: No nystagmus.     Conjunctiva/sclera: Conjunctivae normal.     Right eye: Right conjunctiva is not injected.     Left eye: Left conjunctiva is not injected.     Pupils: Pupils are equal, round, and reactive to light.     Comments: Pain with EOM of left eye. Periorbital tenderness.   Neck:     Musculoskeletal: Normal range of motion and neck supple. No muscular tenderness.  Cardiovascular:     Rate and Rhythm: Normal rate and regular rhythm.  Pulmonary:     Effort: Pulmonary effort is normal.     Breath sounds: Normal breath sounds.  Lymphadenopathy:     Cervical: No cervical adenopathy.  Neurological:     Mental Status: She is alert and oriented to person, place, and time.     GCS: GCS eye subscore is 4. GCS verbal subscore is 5. GCS motor subscore is 6.     Cranial Nerves: Cranial nerves are intact.     Sensory: Sensation is intact.     Motor: Motor function is intact.     Coordination: Coordination is intact.     Gait: Gait is intact.     UC Treatments / Results  Labs (all labs ordered are listed, but only abnormal results are displayed) Labs Reviewed  CBC  SEDIMENTATION RATE  C-REACTIVE PROTEIN  COMPREHENSIVE METABOLIC PANEL    EKG   Radiology No results found.  Procedures Procedures (including critical care time)  Medications Ordered in UC Medications  metoCLOPramide (REGLAN) injection 5 mg (5 mg Intramuscular Given 02/15/19 1715)  dexamethasone (DECADRON) injection 10 mg (10 mg Intramuscular Given 02/15/19 1715)  ketorolac (TORADOL) 30 MG/ML injection 30 mg (30 mg Intramuscular Given 02/15/19 1715)    Initial Impression / Assessment and Plan / UC Course  I have reviewed the triage vital signs and the nursing notes.  Pertinent labs & imaging results that were available during my care of the patient were reviewed by me and considered in my medical decision making (see chart for details).     Lower suspicion for [facility] given length of pain, neuro exam grossly intact, no worsening symptoms. Lower suspicion for closed angle glaucoma given no changes in vision. ? Temporal arteritis vs migraines vs other headache processes. ESR, CRP, CBC, and CMP drawn to evaluate for giant cell arteritis. Toradol, reglan, decadron injection given in office for symptomatic treatment. Patient to follow up with neurology for further evaluation of headache. Strict precautions given.  Final Clinical Impressions(s) / UC Diagnoses   Final diagnoses:  Acute intractable headache, unspecified headache type   ED Prescriptions    None        Ok Edwards, PA-C 02/15/19 2002

## 2019-02-15 NOTE — ED Triage Notes (Signed)
Pt presents to UC w/ c/o headache starting 4 days ago. Pt reports pain started on back of head on left side that radiates to right side and front of face. Pt reports eyes swell slightly when in pain. Pt has tried ibuprofen and tylenol without relief of pain. Pt states pain goes away and comes back worse. Pt reports it feels like an excruciating pressure.

## 2019-02-17 ENCOUNTER — Telehealth: Payer: Self-pay | Admitting: Physician Assistant

## 2019-02-17 LAB — CBC
Hematocrit: 46.3 % (ref 34.0–46.6)
Hemoglobin: 14.9 g/dL (ref 11.1–15.9)
MCH: 30.1 pg (ref 26.6–33.0)
MCHC: 32.2 g/dL (ref 31.5–35.7)
MCV: 94 fL (ref 79–97)
Platelets: 332 10*3/uL (ref 150–450)
RBC: 4.95 x10E6/uL (ref 3.77–5.28)
RDW: 12.1 % (ref 11.7–15.4)
WBC: 6.8 10*3/uL (ref 3.4–10.8)

## 2019-02-17 LAB — COMPREHENSIVE METABOLIC PANEL
ALT: 27 IU/L (ref 0–32)
AST: 27 IU/L (ref 0–40)
Albumin/Globulin Ratio: 1.5 (ref 1.2–2.2)
Albumin: 4.9 g/dL (ref 3.8–4.9)
Alkaline Phosphatase: 66 IU/L (ref 39–117)
BUN/Creatinine Ratio: 13 (ref 9–23)
BUN: 10 mg/dL (ref 6–24)
Bilirubin Total: 1 mg/dL (ref 0.0–1.2)
CO2: 20 mmol/L (ref 20–29)
Calcium: 9.9 mg/dL (ref 8.7–10.2)
Chloride: 100 mmol/L (ref 96–106)
Creatinine, Ser: 0.76 mg/dL (ref 0.57–1.00)
GFR calc Af Amer: 103 mL/min/{1.73_m2} (ref >59)
GFR calc non Af Amer: 89 mL/min/{1.73_m2} (ref >59)
Globulin, Total: 3.3 g/dL (ref 1.5–4.5)
Glucose: 116 mg/dL — ABNORMAL HIGH (ref 65–99)
Potassium: 4.1 mmol/L (ref 3.5–5.2)
Sodium: 140 mmol/L (ref 134–144)
Total Protein: 8.2 g/dL (ref 6.0–8.5)

## 2019-02-17 LAB — C-REACTIVE PROTEIN: CRP: 3 mg/L (ref 0–10)

## 2019-02-17 LAB — SEDIMENTATION RATE: Sed Rate: 23 mm/hr (ref 0–40)

## 2019-02-17 NOTE — Telephone Encounter (Signed)
Labs drawn during 02/15/2019 visit including CBC, CMP, ESR, CRP. However, lab was not picked up due to carrier error, and ESR and CBC and no longer viable sample.   Called patient, who stated her symptoms are better since treatment with toradol, reglan, decadron injection. Will continue with CMP and CRP for now for evaluation. If CRP elevated, to follow up with neurology as planned. Return precautions discussed. Patient expresses understanding and agrees to plan.  Case discussed with Dr Joseph Art, who agrees to plan.

## 2019-02-18 ENCOUNTER — Emergency Department (HOSPITAL_COMMUNITY): Payer: Medicare HMO

## 2019-02-18 ENCOUNTER — Telehealth: Payer: Self-pay | Admitting: Emergency Medicine

## 2019-02-18 ENCOUNTER — Other Ambulatory Visit: Payer: Self-pay

## 2019-02-18 ENCOUNTER — Ambulatory Visit
Admission: EM | Admit: 2019-02-18 | Discharge: 2019-02-18 | Disposition: A | Discharge status: Home / Self Care | Payer: Medicare HMO | Source: Home / Self Care

## 2019-02-18 ENCOUNTER — Emergency Department (HOSPITAL_COMMUNITY)
Admission: EM | Admit: 2019-02-18 | Discharge: 2019-02-18 | Disposition: A | Discharge status: Home / Self Care | Payer: Medicare HMO | Attending: Emergency Medicine | Admitting: Emergency Medicine

## 2019-02-18 DIAGNOSIS — Z7982 Long term (current) use of aspirin: Secondary | ICD-10-CM | POA: Diagnosis not present

## 2019-02-18 DIAGNOSIS — I10 Essential (primary) hypertension: Secondary | ICD-10-CM | POA: Insufficient documentation

## 2019-02-18 DIAGNOSIS — E119 Type 2 diabetes mellitus without complications: Secondary | ICD-10-CM | POA: Insufficient documentation

## 2019-02-18 DIAGNOSIS — R51 Headache: Secondary | ICD-10-CM | POA: Diagnosis not present

## 2019-02-18 DIAGNOSIS — Z79899 Other long term (current) drug therapy: Secondary | ICD-10-CM | POA: Diagnosis not present

## 2019-02-18 DIAGNOSIS — B029 Zoster without complications: Secondary | ICD-10-CM | POA: Insufficient documentation

## 2019-02-18 DIAGNOSIS — B0231 Zoster conjunctivitis: Secondary | ICD-10-CM | POA: Diagnosis not present

## 2019-02-18 DIAGNOSIS — Z7984 Long term (current) use of oral hypoglycemic drugs: Secondary | ICD-10-CM | POA: Diagnosis not present

## 2019-02-18 LAB — CBC WITH DIFFERENTIAL/PLATELET
Abs Immature Granulocytes: 0.02 10*3/uL (ref 0.00–0.07)
Basophils Absolute: 0.1 10*3/uL (ref 0.0–0.1)
Basophils Relative: 1 %
Eosinophils Absolute: 0.4 10*3/uL (ref 0.0–0.5)
Eosinophils Relative: 5 %
HCT: 43.4 % (ref 36.0–46.0)
Hemoglobin: 13.9 g/dL (ref 12.0–15.0)
Immature Granulocytes: 0 %
Lymphocytes Relative: 40 %
Lymphs Abs: 3.1 10*3/uL (ref 0.7–4.0)
MCH: 29.6 pg (ref 26.0–34.0)
MCHC: 32 g/dL (ref 30.0–36.0)
MCV: 92.3 fL (ref 80.0–100.0)
Monocytes Absolute: 0.6 10*3/uL (ref 0.1–1.0)
Monocytes Relative: 8 %
Neutro Abs: 3.6 10*3/uL (ref 1.7–7.7)
Neutrophils Relative %: 46 %
Platelets: 308 10*3/uL (ref 150–400)
RBC: 4.7 MIL/uL (ref 3.87–5.11)
RDW: 11.8 % (ref 11.5–15.5)
WBC: 7.8 10*3/uL (ref 4.0–10.5)
nRBC: 0 % (ref 0.0–0.2)

## 2019-02-18 LAB — COMPREHENSIVE METABOLIC PANEL
ALT: 30 U/L (ref 0–44)
AST: 26 U/L (ref 15–41)
Albumin: 3.9 g/dL (ref 3.5–5.0)
Alkaline Phosphatase: 50 U/L (ref 38–126)
Anion gap: 7 (ref 5–15)
BUN: 15 mg/dL (ref 6–20)
CO2: 27 mmol/L (ref 22–32)
Calcium: 9 mg/dL (ref 8.9–10.3)
Chloride: 104 mmol/L (ref 98–111)
Creatinine, Ser: 0.97 mg/dL (ref 0.44–1.00)
GFR calc Af Amer: >60 mL/min (ref >60)
GFR calc non Af Amer: >60 mL/min (ref >60)
Glucose, Bld: 120 mg/dL — ABNORMAL HIGH (ref 70–99)
Potassium: 3.7 mmol/L (ref 3.5–5.1)
Sodium: 138 mmol/L (ref 135–145)
Total Bilirubin: 1.5 mg/dL — ABNORMAL HIGH (ref 0.3–1.2)
Total Protein: 7.2 g/dL (ref 6.5–8.1)

## 2019-02-18 MED ORDER — OXYCODONE-ACETAMINOPHEN 5-325 MG PO TABS
1.0000 | ORAL_TABLET | Freq: Once | ORAL | Status: AC
  Administered 2019-02-18: 17:00:00 1 via ORAL
  Filled 2019-02-18: qty 1

## 2019-02-18 MED ORDER — TETRACAINE HCL 0.5 % OP SOLN
2.0000 [drp] | Freq: Once | OPHTHALMIC | Status: AC
  Administered 2019-02-18: 2 [drp] via OPHTHALMIC
  Filled 2019-02-18: qty 4

## 2019-02-18 MED ORDER — ONDANSETRON 4 MG PO TBDP
4.0000 mg | ORAL_TABLET | Freq: Once | ORAL | Status: AC
  Administered 2019-02-18: 4 mg via ORAL
  Filled 2019-02-18: qty 1

## 2019-02-18 MED ORDER — ONDANSETRON HCL 4 MG PO TABS: 4.0000 mg | ORAL_TABLET | Freq: Three times a day (TID) | ORAL | 0 refills | Status: DC | PRN

## 2019-02-18 MED ORDER — ERYTHROMYCIN 5 MG/GM OP OINT: 1.0000 "application " | TOPICAL_OINTMENT | Freq: Four times a day (QID) | OPHTHALMIC | 0 refills | Status: DC

## 2019-02-18 MED ORDER — VALACYCLOVIR HCL 500 MG PO TABS
1000.0000 mg | ORAL_TABLET | Freq: Once | ORAL | Status: AC
  Administered 2019-02-18: 18:00:00 1000 mg via ORAL
  Filled 2019-02-18: qty 2

## 2019-02-18 MED ORDER — OXYCODONE HCL 5 MG PO TABS: 2.5000 mg | ORAL_TABLET | ORAL | 0 refills | Status: DC | PRN

## 2019-02-18 MED ORDER — VALACYCLOVIR HCL 1 G PO TABS: 1000.0000 mg | ORAL_TABLET | Freq: Three times a day (TID) | ORAL | 0 refills | Status: DC

## 2019-02-18 MED ORDER — ONDANSETRON HCL 4 MG/2ML IJ SOLN: 4.0000 mg | Freq: Once | INTRAMUSCULAR | Status: DC

## 2019-02-18 MED ORDER — FLUORESCEIN SODIUM 1 MG OP STRP
1.0000 | ORAL_STRIP | Freq: Once | OPHTHALMIC | Status: AC
  Administered 2019-02-18: 1 via OPHTHALMIC
  Filled 2019-02-18: qty 1

## 2019-02-18 NOTE — ED Triage Notes (Signed)
Pt endorses generalized headache with left sided blurred vision, facial tingling x 1 week. Went to [facility]  2 days ago and "got 2 shots" with relief. Pt states ha is worse today. No acute neuro deficits.

## 2019-02-18 NOTE — ED Notes (Signed)
Patient verbalizes understanding of discharge instructions. Opportunity for questioning and answers were provided. Pt discharged from ED. 

## 2019-02-18 NOTE — ED Notes (Signed)
Pt was seen in the urgent care on 02/15/19 for headache and was given headache cocktail. Pt said the pain has gotten worse and she has tingling in her head and a swollen gland in her neck.Pt also having some vision issues when she closes her eyes. Pt was advised to either be transported by ems or could go to ed for further evaluation due resources that the emergency dept will have. Pt wanted to take herself.

## 2019-02-18 NOTE — Discharge Instructions (Signed)
You were given narcotic and or sedative medications while in the emergency department. Do not drive. Do not use machinery or power tools. Do not sign legal documents. Do not drink alcohol. Do not take sleeping pills. Do not supervise children by yourself. Do not participate in activities that require climbing or being in high places.  Contact a health care provider if: Your pain is not relieved with prescribed medicines. Your pain does not get better after the rash heals. You have signs of infection in the rash area, such as: More redness, swelling, or pain around the rash. Fluid or blood coming from the rash. The rash area feeling warm to the touch. Pus or a bad smell coming from the rash. Get help right away if: The rash is on your face or nose. You have facial pain, pain around your eye area, or loss of feeling on one side of your face. You have difficulty seeing. You have ear pain or have ringing in your ear. You have a loss of taste. Your condition gets worse.

## 2019-02-18 NOTE — Telephone Encounter (Signed)
PT was seen here on 02/15/19 with same symptoms of headache and that has gotten worse with tingling n face and swollen glands in her neck.  Pt was sent to ED for further evaluation and more intense workup. AMY YU was provider.

## 2019-02-18 NOTE — ED Provider Notes (Signed)
[facility] DEPART[facility] Provider Note   CSN: [id number] Arrival date & time: 02/18/19  1422     History   Chief Complaint Chief Complaint  Patient presents with   Headache    HPI Jill Anderson is a 53 y.o. female who presents the emergency department with chief complaint of left sided headache.  She has a past medical history of diabetes, hypercholesterolemia.  Her symptoms began next days ago when she had severe pain in her occipital region which shot across the left side of her head into her eye.  Since that time she has had intermittent sharp pain in the left occiput and temporal region and somewhat over the top of her head.  She has had some paresthesia on the left side of her cheek and she feels paresthesia across both of her eyes.  She states that the pain has become unbearable she has had difficulty sleeping and she has noticed that she is got swollen "lumps" in front and behind her left ear which are very tender to touch.  She denies changes in hearing.  She has pain in her left eye and has some mild photophobia.  She is never had anything like this before and does not get headaches.  She tried Tylenol at home without any relief in her symptoms.     HPI  Past Medical History:  Diagnosis Date   Borderline diabetic    Chronic back pain    Diabetes mellitus without complication ([place])    Hypercholesteremia     Patient Active Problem List   Diagnosis Date Noted   Myalgia due to statin 12/12/2018   Vitamin D deficiency 09/21/2017   Insomnia 05/14/2017   Menopausal symptoms 01/04/2017   Urticaria 12/14/2016   Pain in both hands 12/14/2016   Hypertension 10/27/2016   Bilateral anterior knee pain 03/20/2016   Left ureteral stone 10/27/2015   New onset of headaches after age 39 10/07/2015   Hyperlipidemia associated with type 2 diabetes mellitus ([place]) 07/15/2015   DM2 (diabetes mellitus, type 2) ([place]) 03/04/2015   Chronic neck pain  09/11/2014   Cervical disc disorder with radiculopathy of cervical region 09/11/2014   Chronic radicular low back pain 09/11/2014    Past Surgical History:  Procedure Laterality Date   ABDOMINAL HYSTERECTOMY     BACK SURGERY     CYSTOSCOPY WITH RETROGRADE PYELOGRAM, URETEROSCOPY AND STENT PLACEMENT Left 10/28/2015   Procedure: CYSTOSCOPY WITH RETROGRADE PYELOGRAM, URETEROSCOPY AND STENT PLACEMENT, AND LASER;  Surgeon: Irine Seal, MD;  Location: [facility];  Service: Urology;  Laterality: Left;     OB History   No obstetric history on file.      Home Medications    Prior to Admission medications   Medication Sig Start Date End Date Taking? Authorizing Provider  ACCU-CHEK FASTCLIX LANCETS MISC USE AS DIRECTED 06/13/18   Charlott Rakes, MD  acetaminophen-codeine (TYLENOL #3) 300-30 MG tablet Take 1 tablet by mouth every 12 (twelve) hours as needed for moderate pain. Dx: Cervical and lumbar radiculopathy 12/12/18   Charlott Rakes, MD  aspirin EC 81 MG tablet Take 1 tablet (81 mg total) by mouth daily. 09/29/16   Funches, Adriana Mccallum, MD  Blood Glucose Monitoring Suppl (ACCU-CHEK GUIDE ME) w/Device KIT 1 each by Does not apply route daily. 06/13/18   Charlott Rakes, MD  cetirizine (ZYRTEC) 10 MG tablet Take 1 tablet (10 mg total) by mouth daily. 12/13/17   Charlott Rakes, MD  Elastic Bandages & Supports ([place]  M 15-20MMHG) MISC 1 each by Does not apply route daily. 03/20/16   Funches, Adriana Mccallum, MD  ergocalciferol (DRISDOL) 50000 units capsule Take 1 capsule (50,000 Units total) by mouth once a week. 08/13/17   Charlott Rakes, MD  famotidine (PEPCID) 20 MG tablet Take 1 tablet (20 mg total) by mouth 2 (two) times daily. 11/21/18   Hedges, Dellis Filbert, PA-C  gabapentin (NEURONTIN) 300 MG capsule Take 1 capsule (300 mg total) by mouth at bedtime. 12/12/18   Charlott Rakes, MD  glucose blood (ACCU-CHEK GUIDE) test strip Use as instructed 06/13/18   Charlott Rakes, MD  ibuprofen  (ADVIL,MOTRIN) 800 MG tablet Take 1 tablet (800 mg total) by mouth every 12 (twelve) hours as needed. 03/15/18   Charlott Rakes, MD  lidocaine (LIDODERM) 5 % Place 1 patch onto the skin daily. Remove & Discard patch within 12 hours or as directed by MD. Dx: cervical and Lumbar radiculopathy 09/19/18   Charlott Rakes, MD  metFORMIN (GLUCOPHAGE) 500 MG tablet Take 2 tablets (1,000 mg total) by mouth 2 (two) times daily with a meal. 12/12/18   Charlott Rakes, MD  Misc. Devices (CANE) MISC 1 each by Does not apply route as needed. 03/04/15   Funches, Adriana Mccallum, MD  psyllium (METAMUCIL) 58.6 % powder Take 1 packet by mouth 2 (two) times daily.    [provider]    Family History Family History  Problem Relation Age of Onset   Diabetes Mother    Hypertension Mother    Heart disease Mother        based at age 34 of MI   Diabetes Father    Hypertension Father    Heart disease Father    Cancer Maternal Aunt        liver    Social History Social History   Tobacco Use   Smoking status: Never Smoker   Smokeless tobacco: Never Used  Substance Use Topics   Alcohol use: No   Drug use: No     Allergies   Atorvastatin   Review of Systems Review of Systems  Ten systems reviewed and are negative for acute change, except as noted in the HPI.   Physical Exam Updated Vital Signs BP 139/70    Pulse 75    Temp 98.6 F (37 C) (Oral)    Resp 16    Ht '5\' 2"'$  (1.575 m)    Wt 77.1 kg    SpO2 99%    BMI 31.09 kg/m   Physical Exam Vitals signs and nursing note reviewed.  Constitutional:      General: She is not in acute distress.    Appearance: She is well-developed. She is not diaphoretic.  HENT:     Head: Normocephalic and atraumatic.     Right Ear: External ear normal.     Left Ear: External ear normal.     Mouth/Throat:     Pharynx: No oropharyngeal exudate.  Eyes:     General: Lids are normal. Vision grossly intact. No visual field deficit or scleral icterus.     Extraocular Movements: Extraocular movements intact.     Conjunctiva/sclera: Conjunctivae normal.     Pupils: Pupils are equal, round, and reactive to light.     Left eye: No fluorescein uptake.     Slit lamp exam:    Left eye: No photophobia.   Neck:     Musculoskeletal: Normal range of motion and neck supple.     Thyroid: No thyromegaly.     Vascular: No  JVD.  Cardiovascular:     Rate and Rhythm: Normal rate and regular rhythm.     Heart sounds: Normal heart sounds. No murmur. No friction rub. No gallop.   Pulmonary:     Effort: Pulmonary effort is normal. No respiratory distress.     Breath sounds: Normal breath sounds.  Abdominal:     General: Bowel sounds are normal. There is no distension.     Palpations: Abdomen is soft. There is no mass.     Tenderness: There is no abdominal tenderness. There is no guarding.  Musculoskeletal: Normal range of motion.        General: No tenderness.     Comments: No meningismus  Lymphadenopathy:     Head:     Left side of head: Preauricular and posterior auricular adenopathy present.  Skin:    General: Skin is warm and dry.       Neurological:     Mental Status: She is alert and oriented to person, place, and time.     Cranial Nerves: No cranial nerve deficit.     Coordination: Coordination normal.     Deep Tendon Reflexes: Reflexes are normal and symmetric.      ED Treatments / Results  Labs (all labs ordered are listed, but only abnormal results are displayed) Labs Reviewed  COMPREHENSIVE METABOLIC PANEL - Abnormal; Notable for the following components:      Result Value   Glucose, Bld 120 (*)    Total Bilirubin 1.5 (*)    All other components within normal limits  CBC WITH DIFFERENTIAL/PLATELET    EKG None  Radiology Ct Head Wo Contrast  Result Date: 02/18/2019 CLINICAL DATA:  Severe headache, left-sided blurred vision EXAM: CT HEAD WITHOUT CONTRAST TECHNIQUE: Contiguous axial images were obtained from the base of the  skull through the vertex without intravenous contrast. COMPARISON:  None. FINDINGS: Brain: No evidence of acute infarction, hemorrhage, hydrocephalus, extra-axial collection or mass lesion/mass effect. Vascular: No hyperdense vessel or unexpected calcification. Skull: Normal. Negative for fracture or focal lesion. Sinuses/Orbits: No acute finding. Other: None. IMPRESSION: No acute intracranial pathology. No non-contrast CT findings to explain headache. Electronically Signed   By: Eddie Candle M.D.   On: 02/18/2019 15:30    Procedures Procedures (including critical care time)  Medications Ordered in ED Medications  valACYclovir (VALTREX) tablet 1,000 mg (has no administration in time range)  oxyCODONE-acetaminophen (PERCOCET/ROXICET) 5-325 MG per tablet 1 tablet (has no administration in time range)  ondansetron (ZOFRAN-ODT) disintegrating tablet 4 mg (has no administration in time range)  fluorescein ophthalmic strip 1 strip (has no administration in time range)  tetracaine (PONTOCAINE) 0.5 % ophthalmic solution 2 drop (has no administration in time range)     Initial Impression / Assessment and Plan / ED Course  I have reviewed the triage vital signs and the nursing notes.  Pertinent labs & imaging results that were available during my care of the patient were reviewed by me and considered in my medical decision making (see chart for details).        XA:JOINOMVE VS: BP 139/81    Pulse 81    Temp 98.6 F (37 C) (Oral)    Resp 16    Ht 5' 2"  (1.575 m)    Wt 77.1 kg    SpO2 98%    BMI 31.09 kg/m  HM:CNOBSJG is gathered by patient  and emr. GEZ:MOQHUTML considerations for headache include subarachnoid hemorrhage, meningitis, temporal arteritis, glaucoma, cerebral ischemia, carotid/vertebral dissection, intracranial  tumor, Venous sinus thrombosis, carbon monoxide poisoning, acute or chronic subdural hemorrhage.  Other considerations include: Migraine, Cluster headache, Hypertension,  Caffeine, alcohol, or drug withdrawal, Pseudotumor cerebri, Arteriovenous malformation, Head injury, Neurocysticercosis, Post-lumbar puncture, Preeclampsia, Tension headache, Sinusitis, Cervical arthritis, Refractive error causing strain, Dental abscess, Otitis media, Temporomandibular joint syndrome, Depression, Somatoform disorder (eg, somatization) Trigeminal neuralgia, Glossopharyngeal neuralgia. Labs: I reviewed the labs which show slightly elevated blood glucose without other abnormality Imaging: I personally reviewed the images (ct head) which show(s) no acute abonrmalities  JOA:CZYSAYT with zoster of the left scalp. Left eye is painful, but no rash or dendritic lesions. + conjunctivitis present. Patient given 1 g valtrex here and will continue with 1 g tid x 7 days. Oxy IR 27m tabs for pain and zofran. Romycin ointment for the eye with close ophthalmology follow up. Isolation precautions discussed. Return precautions discussed. Patient disposition:discharge Patient condition: good. The patient appears reasonably screened and/or stabilized for discharge and I doubt any other medical condition or other [facility]iring further screening, evaluation, or treatment in the ED at this time prior to discharge. I have discussed lab and/or imaging findings with the patient and answered all questions/concerns to the best of my ability. I have discussed return precautions and OP follow up.      Final Clinical Impressions(s) / ED Diagnoses   Final diagnoses:  Herpes zoster conjunctivitis  Herpes zoster without complication    ED Discharge Orders    None       HMargarita Mail PA-C 02/18/19 1802    RQuintella Reichert MD 02/20/19 1330

## 2019-02-20 NOTE — Progress Notes (Signed)
[facility] Note  02/21/2019     CHIEF COMPLAINT Patient presents for Retina Evaluation and Eye Pain   HISTORY OF PRESENT ILLNESS: Jill Anderson is a 53 y.o. female who presents to the clinic today for:   HPI    Retina Evaluation    In left eye.  This started 1 week ago.  Duration of 1 week.  Associated Symptoms Pain.  I, the attending physician,  performed the HPI with the patient and updated documentation appropriately.          Eye Pain      In left eye.  Characterized as foreign body sensation, pain with eye movement, irritation and sharp pain.  Pain was noted as 8/10.  Occurring constantly.  Duration of 1 week.  Since onset it is gradually worsening.  I, the attending physician,  performed the HPI with the patient and updated documentation appropriately.          Comments    New patient retina eval (ED follow up) Patient states she woke up a week ago with extreme pain in and around her left eye.  She states at this time, glands in neck and face were very swollen and was seen in Urgent Care facility.  Patient states she was given two injections into her hip/bottom area but this did not improve pain.  Patient was seen in ED on Saturday, 09/19 and was told she has shingles.  Patient states blistering is present around left eye, possibly in left eye, on forehead and in scalp.  Patient states pain and symptoms are only becoming worse.  She was given Rx for Erythromycin ung to use TID OS and Valtrex PO TID (1gm).  As of today, patient states pain has not gotten better.  Patient denies any decrease in vision, floaters or flashes of light.       Last edited by Bernarda Caffey, MD on 02/21/2019  8:49 AM. (History)    pt has a rash on the left side of her face close to her eye, she states she was seen in the ED on Saturday and was told she has shingles, she has been taking Valtrex TID and using erythromycin TID, since then, she states she has never had shingles  before and it's very painful, she states she has been under a lot of stress lately, she states she is borderline diabetic, she does not have a history of cold sores, pt has an appt with her pcp to follow up, pt uses [facility] for eye exams  Referring physician: Charlott Rakes, MD [place],  [place] [address]  HISTORICAL INFORMATION:   Selected notes from the [place] ED f/u for eye pain LEE:  Ocular Hx- PMH-   CURRENT MEDICATIONS: Current Outpatient Medications (Ophthalmic Drugs)  Medication Sig  . erythromycin ophthalmic ointment Place 1 application into the left eye every 6 (six) hours. Place 1/2 inch ribbon of ointment in the affected eye 4 times a day for 5 days   No current facility-administered medications for this visit.  (Ophthalmic Drugs)   Current Outpatient Medications (Other)  Medication Sig  . ACCU-CHEK FASTCLIX LANCETS MISC USE AS DIRECTED  . acetaminophen-codeine (TYLENOL #3) 300-30 MG tablet Take 1 tablet by mouth every 12 (twelve) hours as needed for moderate pain. Dx: Cervical and lumbar radiculopathy  . aspirin EC 81 MG tablet Take 1 tablet (81 mg total) by mouth daily.  . Blood Glucose Monitoring Suppl (ACCU-CHEK  GUIDE ME) w/Device KIT 1 each by Does not apply route daily.  . cetirizine (ZYRTEC) 10 MG tablet Take 1 tablet (10 mg total) by mouth daily.  Water engineer Bandages & Supports (TRUFORM ARM 46 M 15-20MMHG) MISC 1 each by Does not apply route daily.  . ergocalciferol (DRISDOL) 50000 units capsule Take 1 capsule (50,000 Units total) by mouth once a week.  . famotidine (PEPCID) 20 MG tablet Take 1 tablet (20 mg total) by mouth 2 (two) times daily.  Marland Kitchen gabapentin (NEURONTIN) 300 MG capsule Take 1 capsule (300 mg total) by mouth at bedtime.  Marland Kitchen glucose blood (ACCU-CHEK GUIDE) test strip Use as instructed  . ibuprofen (ADVIL,MOTRIN) 800 MG tablet Take 1 tablet (800 mg total) by mouth every 12 (twelve) hours as needed.  . lidocaine  (LIDODERM) 5 % Place 1 patch onto the skin daily. Remove & Discard patch within 12 hours or as directed by MD. Dx: cervical and Lumbar radiculopathy  . metFORMIN (GLUCOPHAGE) 500 MG tablet Take 2 tablets (1,000 mg total) by mouth 2 (two) times daily with a meal.  . Misc. Devices (CANE) MISC 1 each by Does not apply route as needed.  . ondansetron (ZOFRAN) 4 MG tablet Take 1 tablet (4 mg total) by mouth every 8 (eight) hours as needed for nausea or vomiting.  Marland Kitchen oxyCODONE (ROXICODONE) 5 MG immediate release tablet Take 0.5-1 tablets (2.5-5 mg total) by mouth every 4 (four) hours as needed for severe pain.  Marland Kitchen psyllium (METAMUCIL) 58.6 % powder Take 1 packet by mouth 2 (two) times daily.  . valACYclovir (VALTREX) 1000 MG tablet Take 1 tablet (1,000 mg total) by mouth 3 (three) times daily.   No current facility-administered medications for this visit.  (Other)      REVIEW OF SYSTEMS: ROS    Positive for: Eyes   Negative for: Constitutional, Gastrointestinal, Neurological, Skin, Genitourinary, Musculoskeletal, HENT, Endocrine, Cardiovascular, Respiratory, Psychiatric, Allergic/Imm, Heme/Lymph   Last edited by Doneen Poisson on 02/21/2019  8:22 AM. (History)       ALLERGIES Allergies  Allergen Reactions  . Atorvastatin Other (See Comments)    myalgia    PAST MEDICAL HISTORY Past Medical History:  Diagnosis Date  . Borderline diabetic   . Chronic back pain   . Diabetes mellitus without complication ([place])   . Hypercholesteremia    Past Surgical History:  Procedure Laterality Date  . ABDOMINAL HYSTERECTOMY    . BACK SURGERY    . CYSTOSCOPY WITH RETROGRADE PYELOGRAM, URETEROSCOPY AND STENT PLACEMENT Left 10/28/2015   Procedure: CYSTOSCOPY WITH RETROGRADE PYELOGRAM, URETEROSCOPY AND STENT PLACEMENT, AND LASER;  Surgeon: Irine Seal, MD;  Location: [facility];  Service: Urology;  Laterality: Left;    FAMILY HISTORY Family History  Problem Relation Age of Onset  . Diabetes Mother   .  Hypertension Mother   . Heart disease Mother        based at age 31 of MI  . Diabetes Father   . Hypertension Father   . Heart disease Father   . Cancer Maternal Aunt        liver    SOCIAL HISTORY Social History   Tobacco Use  . Smoking status: Never Smoker  . Smokeless tobacco: Never Used  Substance Use Topics  . Alcohol use: No  . Drug use: No         OPHTHALMIC EXAM:  Base Eye Exam    Visual Acuity (Snellen - Linear)      Right Left  Dist cc 20/20 20/20   Correction: Glasses       Tonometry (Tonopen, 8:37 AM)      Right Left   Pressure 14 16       Pupils      Dark Light Shape React APD   Right 2 1 Round Minimal 0   Left 2 1 Round Minimal 0       Visual Fields      Left Right    Full Full       Extraocular Movement      Right Left    Full Full       Neuro/Psych    Oriented x3: Yes   Mood/Affect: Normal       Dilation    Both eyes: 1.0% Mydriacyl, 2.5% Phenylephrine @ 8:37 AM        Slit Lamp and Fundus Exam    External Exam      Right Left   External Normal raised erythematous vesticles V1, -hutchinson sign       Slit Lamp Exam      Right Left   Lids/Lashes Dermatochalasis - upper lid Dermatochalasis - upper lid   Conjunctiva/Sclera White and quiet, mild nasal pterygium White and quiet, mild temporal pinguecula   Cornea 1+ Punctate epithelial erosions Clear, no dendrites or pseudo dendrites   Anterior Chamber deep and clear, narrow temporal angle deep and clear, narrow temporal angle   Iris Round and dilated Round and reactive   Lens 2+ Nuclear sclerosis, 2+ Cortical cataract 2+ Nuclear sclerosis, 2+ Cortical cataract   Vitreous Vitreous syneresis Vitreous syneresis, no cell       Fundus Exam      Right Left   Disc Pink and Sharp, mild temporal Peripapillary atrophy Pink and Sharp, mild temporal Peripapillary atrophy   C/D Ratio 0.1 0.2   Macula Flat, Good foveal reflex, Retinal pigment epithelial mottling, No heme or edema  Flat, Good foveal reflex, Retinal pigment epithelial mottling, No heme or edema   Vessels Tortuous Tortuous   Periphery Attached, No heme  Attached, No heme         Refraction    Wearing Rx      Sphere Cylinder Add   Right -0.25 Sphere +2.25   Left -0.50 Sphere +2.25   Age: 49 yrs   Type: prog       Manifest Refraction      Sphere Cylinder Dist VA   Right -0.25 Sphere 20/20   Left -0.50 Sphere 20/20          IMAGING AND PROCEDURES  Imaging and Procedures for _0 @  OCT, Retina - OU - Both Eyes       Right Eye Quality was good. Central Foveal Thickness: 263. Progression has no prior data. Findings include normal foveal contour, no IRF, no SRF, vitreomacular adhesion .   Left Eye Quality was good. Central Foveal Thickness: 275. Progression has no prior data. Findings include normal foveal contour, no IRF, no SRF, vitreomacular adhesion .   Notes *Images captured and stored on drive  Diagnosis / Impression:  NFP, no IRF/SRF OU VMA OU No CME OU  Clinical management:  See below  Abbreviations: NFP - Normal foveal profile. CME - cystoid macular edema. PED - pigment epithelial detachment. IRF - intraretinal fluid. SRF - subretinal fluid. EZ - ellipsoid zone. ERM - epiretinal membrane. ORA - outer retinal atrophy. ORT - outer retinal tubulation. SRHM - subretinal hyper-reflective material  ASSESSMENT/PLAN:    ICD-10-CM   1. Herpes zoster without complication  B15.1   2. Retinal edema  H35.81 OCT, Retina - OU - Both Eyes  3. Combined forms of age-related cataract of both eyes  H25.813     1. Herpes Zoster without ocular involvement - 1 wk history of painful vesicles in left V1 dermatome - presented to ED on 9.19.20 and was started on po valtrex 1g TID and erythromycin ung TID OS - on exam today, no corneal, conjunctival or ocular involvement - pt reports subjective relief with erythromycin ung -- ok to continue - continue po valtrex 1g  TID and f/u as scheduled with PCP  - f/u here prn if left eye becomes involved  2. No retinal edema on exam or OCT  3. Mixed form age related cataracts OU  - not yet visually significant  - monitor for now   Ophthalmic Meds Ordered this visit:  No orders of the defined types were placed in this encounter.      No follow-ups on file.  There are no Patient Instructions on file for this visit.   Explained the diagnoses, plan, and follow up with the patient and they expressed understanding.  Patient expressed understanding of the importance of proper follow up care.   This document serves as a record of services personally performed by Gardiner Sleeper, MD, PhD. It was created on their behalf by Ernest Mallick, OA, an ophthalmic assistant. The creation of this record is the provider's dictation and/or activities during the visit.    Electronically signed by: Ernest Mallick, OA  09.21.2020 11:29 AM    Gardiner Sleeper, M.D., Ph.D. Diseases & Surgery of the Retina and Vitreous [facility]  I have reviewed the above documentation for accuracy and completeness, and I agree with the above. Gardiner Sleeper, M.D., Ph.D. 02/21/19 11:29 AM    Abbreviations: M myopia (nearsighted); A astigmatism; H hyperopia (farsighted); P presbyopia; Mrx spectacle prescription;  CTL contact lenses; OD right eye; OS left eye; OU both eyes  XT exotropia; ET esotropia; PEK punctate epithelial keratitis; PEE punctate epithelial erosions; DES dry eye syndrome; MGD meibomian gland dysfunction; ATs artificial tears; PFAT's preservative free artificial tears; [place] nuclear sclerotic cataract; PSC posterior subcapsular cataract; ERM epi-retinal membrane; PVD posterior vitreous detachment; RD retinal detachment; DM diabetes mellitus; DR diabetic retinopathy; NPDR non-proliferative diabetic retinopathy; PDR proliferative diabetic retinopathy; CSME clinically significant macular edema; DME diabetic  macular edema; dbh dot blot hemorrhages; CWS cotton wool spot; POAG primary open angle glaucoma; C/D cup-to-disc ratio; HVF humphrey visual field; GVF goldmann visual field; OCT optical coherence tomography; IOP intraocular pressure; BRVO Branch retinal vein occlusion; CRVO central retinal vein occlusion; CRAO central retinal artery occlusion; BRAO branch retinal artery occlusion; RT retinal tear; SB scleral buckle; PPV pars plana vitrectomy; VH Vitreous hemorrhage; PRP panretinal laser photocoagulation; IVK intravitreal kenalog; VMT vitreomacular traction; MH Macular hole;  NVD neovascularization of the disc; NVE neovascularization elsewhere; AREDS age related eye disease study; ARMD age related macular degeneration; POAG primary open angle glaucoma; EBMD epithelial/anterior basement membrane dystrophy; ACIOL anterior chamber intraocular lens; IOL intraocular lens; PCIOL posterior chamber intraocular lens; Phaco/IOL phacoemulsification with intraocular lens placement; [place] photorefractive keratectomy; LASIK laser assisted in situ keratomileusis; HTN hypertension; DM diabetes mellitus; COPD chronic obstructive pulmonary disease

## 2019-02-21 ENCOUNTER — Other Ambulatory Visit: Payer: Self-pay

## 2019-02-21 ENCOUNTER — Ambulatory Visit (INDEPENDENT_AMBULATORY_CARE_PROVIDER_SITE_OTHER): Payer: Medicare HMO | Admitting: Ophthalmology

## 2019-02-21 ENCOUNTER — Encounter (INDEPENDENT_AMBULATORY_CARE_PROVIDER_SITE_OTHER): Payer: Self-pay | Admitting: Ophthalmology

## 2019-02-21 DIAGNOSIS — B029 Zoster without complications: Secondary | ICD-10-CM | POA: Diagnosis not present

## 2019-02-21 DIAGNOSIS — H3581 Retinal edema: Secondary | ICD-10-CM

## 2019-02-21 DIAGNOSIS — H25813 Combined forms of age-related cataract, bilateral: Secondary | ICD-10-CM

## 2019-03-14 ENCOUNTER — Ambulatory Visit: Payer: Medicare HMO | Admitting: Family Medicine

## 2019-03-21 ENCOUNTER — Other Ambulatory Visit: Payer: Self-pay

## 2019-03-21 ENCOUNTER — Ambulatory Visit: Payer: Medicare HMO | Attending: Family Medicine | Admitting: Family Medicine

## 2019-03-21 ENCOUNTER — Encounter: Payer: Self-pay | Admitting: Family Medicine

## 2019-03-21 DIAGNOSIS — M5010 Cervical disc disorder with radiculopathy, unspecified cervical region: Secondary | ICD-10-CM | POA: Diagnosis not present

## 2019-03-21 DIAGNOSIS — G8929 Other chronic pain: Secondary | ICD-10-CM | POA: Diagnosis not present

## 2019-03-21 DIAGNOSIS — E1169 Type 2 diabetes mellitus with other specified complication: Secondary | ICD-10-CM

## 2019-03-21 DIAGNOSIS — M5416 Radiculopathy, lumbar region: Secondary | ICD-10-CM | POA: Diagnosis not present

## 2019-03-21 DIAGNOSIS — E785 Hyperlipidemia, unspecified: Secondary | ICD-10-CM | POA: Diagnosis not present

## 2019-03-21 MED ORDER — ACETAMINOPHEN-CODEINE #3 300-30 MG PO TABS: 1.0000 | ORAL_TABLET | Freq: Two times a day (BID) | ORAL | 0 refills | Status: DC | PRN

## 2019-03-21 NOTE — Progress Notes (Signed)
Patient has been called and DOB has been verified. Patient has been screened and transferred to PCP to start phone visit.   Patient is having back pain and neck pain.

## 2019-03-21 NOTE — Progress Notes (Signed)
Virtual Visit via Telephone Note  I connected with Jill Anderson, on 03/21/2019 at 8:37 AM by telephone due to the COVID-19 pandemic and verified that I am speaking with the correct person using two identifiers.   Consent: I discussed the limitations, risks, security and privacy concerns of performing an evaluation and management service by telephone and the availability of in person appointments. I also discussed with the patient that there may be a patient responsible charge related to this service. The patient expressed understanding and agreed to proceed.   Location of Patient: [place]  Location of Provider: Clinic   Persons participating in Telemedicine visit: Delara Shepheard Farrington-CMA Dr. Felecia Shelling     History of Present Illness: Jill Anderson  is a 53 year old female with a history of Type 2 Diabetes Mellitus (A1c 6.7), Hypercholesterolemia, chronic neck and chronic low back pain secondary to degenerative changes of the spine, Insomnia who presents today for follow-up visit. States she is feeling better after the episode of Shingles which was treated at the ED on 02/18/19. Rash has cleared now.  She is currently in the [place] with [place] daughter who came to pick her up so she could provide care for her mother.  Her diabetes has been stable with no hypoglycemia, neuropathy, visual concerns.  Doing well on Metformin.  Still unable to tolerate statin due to myalgias.  Her back pain is rated as an 8/10 but is improved with the use of Tylenol 3.  She had refused spinal fusion which was offered by previous orthopedic. She would like to get another MRI of her back and would like to see another Orthopedic. When she leans over the sink to brush her teeth she gets stuck on straightening up. Using her foot to touch her chest she has relief.  Denies recent falls or loss of sphincteric function. [place] this [place] and would  like to have blood work on her MRI done then. She has no additional concerns today.  Past Medical History:  Diagnosis Date  . Borderline diabetic   . Chronic back pain   . Diabetes mellitus without complication ([place])   . Hypercholesteremia    Allergies  Allergen Reactions  . Atorvastatin Other (See Comments)    myalgia    Current Outpatient Medications on File Prior to Visit  Medication Sig Dispense Refill  . ACCU-CHEK FASTCLIX LANCETS MISC USE AS DIRECTED 102 each 12  . acetaminophen-codeine (TYLENOL #3) 300-30 MG tablet Take 1 tablet by mouth every 12 (twelve) hours as needed for moderate pain. Dx: Cervical and lumbar radiculopathy 60 tablet 0  . aspirin EC 81 MG tablet Take 1 tablet (81 mg total) by mouth daily. 30 tablet 11  . Blood Glucose Monitoring Suppl (ACCU-CHEK GUIDE ME) w/Device KIT 1 each by Does not apply route daily. 1 kit 0  . cetirizine (ZYRTEC) 10 MG tablet Take 1 tablet (10 mg total) by mouth daily. 30 tablet 1  . Elastic Bandages & Supports (TRUFORM ARM 40 M 15-20MMHG) MISC 1 each by Does not apply route daily. 1 each 0  . ergocalciferol (DRISDOL) 50000 units capsule Take 1 capsule (50,000 Units total) by mouth once a week. 9 capsule 1  . erythromycin ophthalmic ointment Place 1 application into the left eye every 6 (six) hours. Place 1/2 inch ribbon of ointment in the affected eye 4 times a day for 5 days 3.5 g 0  . famotidine (PEPCID) 20  MG tablet Take 1 tablet (20 mg total) by mouth 2 (two) times daily. 30 tablet 0  . gabapentin (NEURONTIN) 300 MG capsule Take 1 capsule (300 mg total) by mouth at bedtime. 30 capsule 6  . glucose blood (ACCU-CHEK GUIDE) test strip Use as instructed 100 each 12  . ibuprofen (ADVIL,MOTRIN) 800 MG tablet Take 1 tablet (800 mg total) by mouth every 12 (twelve) hours as needed. 60 tablet 2  . metFORMIN (GLUCOPHAGE) 500 MG tablet Take 2 tablets (1,000 mg total) by mouth 2 (two) times daily with a meal. 120 tablet 6  . Misc. Devices  (CANE) MISC 1 each by Does not apply route as needed. 1 each 0  . ondansetron (ZOFRAN) 4 MG tablet Take 1 tablet (4 mg total) by mouth every 8 (eight) hours as needed for nausea or vomiting. 10 tablet 0  . psyllium (METAMUCIL) 58.6 % powder Take 1 packet by mouth 2 (two) times daily.    . valACYclovir (VALTREX) 1000 MG tablet Take 1 tablet (1,000 mg total) by mouth 3 (three) times daily. 21 tablet 0  . lidocaine (LIDODERM) 5 % Place 1 patch onto the skin daily. Remove & Discard patch within 12 hours or as directed by MD. Dx: cervical and Lumbar radiculopathy 30 patch 1  . oxyCODONE (ROXICODONE) 5 MG immediate release tablet Take 0.5-1 tablets (2.5-5 mg total) by mouth every 4 (four) hours as needed for severe pain. (Patient not taking: Reported on 03/21/2019) 12 tablet 0   No current facility-administered medications on file prior to visit.     Observations/Objective: Awake, alert, oriented x3 Not in acute distress  CMP Latest Ref Rng & Units 02/18/2019 02/15/2019 11/21/2018  Glucose 70 - 99 mg/dL 120(H) 116(H) 113(H)  BUN 6 - 20 mg/dL 15 10 11   Creatinine 0.44 - 1.00 mg/dL 0.97 0.76 0.78  Sodium 135 - 145 mmol/L 138 140 139  Potassium 3.5 - 5.1 mmol/L 3.7 4.1 4.1  Chloride 98 - 111 mmol/L 104 100 105  CO2 22 - 32 mmol/L 27 20 25   Calcium 8.9 - 10.3 mg/dL 9.0 9.9 9.6  Total Protein 6.5 - 8.1 g/dL 7.2 8.2 7.7  Total Bilirubin 0.3 - 1.2 mg/dL 1.5(H) 1.0 1.3(H)  Alkaline Phos 38 - 126 U/L 50 66 57  AST 15 - 41 U/L 26 27 25   ALT 0 - 44 U/L 30 27 33    Lipid Panel     Component Value Date/Time   CHOL 219 (H) 09/23/2018 0926   TRIG 123 09/23/2018 0926   HDL 53 09/23/2018 0926   CHOLHDL 4.1 09/23/2018 0926   CHOLHDL 4.8 07/12/2015 1008   VLDL 14 07/12/2015 1008   LDLCALC 141 (H) 09/23/2018 0926   LABVLDL 25 09/23/2018 0926    Lab Results  Component Value Date   HGBA1C 6.7 (H) 09/23/2018      Assessment and Plan: 1. Chronic radicular low back pain Uncontrolled We will order  MRI when she is back in the country and referred to another orthopedic for second opinion - acetaminophen-codeine (TYLENOL #3) 300-30 MG tablet; Take 1 tablet by mouth every 12 (twelve) hours as needed for moderate pain. Dx: Cervical and lumbar radiculopathy  Dispense: 60 tablet; Refill: 0  2. Cervical disc disorder with radiculopathy of cervical region See #1 above - acetaminophen-codeine (TYLENOL #3) 300-30 MG tablet; Take 1 tablet by mouth every 12 (twelve) hours as needed for moderate pain. Dx: Cervical and lumbar radiculopathy  Dispense: 60 tablet; Refill: 0  3.  Type 2 diabetes mellitus with other specified complication, without long-term current use of insulin (HCC) Controlled with A1c of 6.7 Continue Metformin Counseled on Diabetic diet, my plate method, 445 minutes of moderate intensity exercise/week Blood sugar logs with fasting goals of 80-120 mg/dl, random of less than 180 and in the event of sugars less than 60 mg/dl or greater than 400 mg/dl encouraged to notify the clinic. Advised on the need for annual eye exams, annual foot exams, Pneumonia vaccine. - CMP14+EGFR; Future - Lipid panel; Future - Hemoglobin A1c; Future  4. Hyperlipidemia associated with type 2 diabetes mellitus (HCC) Statin intolerance hence not on statin Low-cholesterol diet Advised to use fish oil capsules   Follow Up Instructions: 3 months for chronic medical conditions   I discussed the assessment and treatment plan with the patient. The patient was provided an opportunity to ask questions and all were answered. The patient agreed with the plan and demonstrated an understanding of the instructions.   The patient was advised to call back or seek an in-person evaluation if the symptoms worsen or if the condition fails to improve as anticipated.     I provided 17 minutes total of non-face-to-face time during this encounter including median intraservice time, reviewing previous notes, labs, imaging,  medications, management and patient verbalized understanding.     Charlott Rakes, MD, FAAFP. [facility] [place] [place]   03/21/2019, 8:37 AM

## 2019-05-01 ENCOUNTER — Other Ambulatory Visit: Payer: Self-pay

## 2019-05-01 ENCOUNTER — Ambulatory Visit: Payer: Medicare HMO | Attending: Family Medicine

## 2019-05-01 DIAGNOSIS — E1169 Type 2 diabetes mellitus with other specified complication: Secondary | ICD-10-CM

## 2019-05-02 LAB — HEMOGLOBIN A1C
Est. average glucose Bld gHb Est-mCnc: 134 mg/dL
Hgb A1c MFr Bld: 6.3 % — ABNORMAL HIGH (ref 4.8–5.6)

## 2019-05-02 LAB — LIPID PANEL
Chol/HDL Ratio: 3.9 ratio (ref 0.0–4.4)
Cholesterol, Total: 202 mg/dL — ABNORMAL HIGH (ref 100–199)
HDL: 52 mg/dL (ref >39)
LDL Chol Calc (NIH): 116 mg/dL — ABNORMAL HIGH (ref 0–99)
Triglycerides: 195 mg/dL — ABNORMAL HIGH (ref 0–149)
VLDL Cholesterol Cal: 34 mg/dL (ref 5–40)

## 2019-05-02 LAB — CMP14+EGFR
ALT: 24 IU/L (ref 0–32)
AST: 18 IU/L (ref 0–40)
Albumin/Globulin Ratio: 1.5 (ref 1.2–2.2)
Albumin: 4.6 g/dL (ref 3.8–4.9)
Alkaline Phosphatase: 79 IU/L (ref 39–117)
BUN/Creatinine Ratio: 20 (ref 9–23)
BUN: 13 mg/dL (ref 6–24)
Bilirubin Total: 0.7 mg/dL (ref 0.0–1.2)
CO2: 25 mmol/L (ref 20–29)
Calcium: 9.6 mg/dL (ref 8.7–10.2)
Chloride: 103 mmol/L (ref 96–106)
Creatinine, Ser: 0.65 mg/dL (ref 0.57–1.00)
GFR calc Af Amer: 116 mL/min/{1.73_m2} (ref >59)
GFR calc non Af Amer: 101 mL/min/{1.73_m2} (ref >59)
Globulin, Total: 3 g/dL (ref 1.5–4.5)
Glucose: 146 mg/dL — ABNORMAL HIGH (ref 65–99)
Potassium: 4.6 mmol/L (ref 3.5–5.2)
Sodium: 140 mmol/L (ref 134–144)
Total Protein: 7.6 g/dL (ref 6.0–8.5)

## 2019-05-16 ENCOUNTER — Other Ambulatory Visit: Payer: Self-pay | Admitting: Family Medicine

## 2019-05-16 DIAGNOSIS — M5010 Cervical disc disorder with radiculopathy, unspecified cervical region: Secondary | ICD-10-CM

## 2019-05-16 DIAGNOSIS — G8929 Other chronic pain: Secondary | ICD-10-CM

## 2019-05-16 NOTE — Telephone Encounter (Signed)
Please fill pain medication if appropriate request

## 2019-06-02 ENCOUNTER — Encounter: Payer: Self-pay | Admitting: Emergency Medicine

## 2019-06-02 ENCOUNTER — Ambulatory Visit
Admission: EM | Admit: 2019-06-02 | Discharge: 2019-06-02 | Disposition: A | Discharge status: Home / Self Care | Payer: Medicare HMO | Attending: Emergency Medicine | Admitting: Emergency Medicine

## 2019-06-02 ENCOUNTER — Other Ambulatory Visit: Payer: Self-pay

## 2019-06-02 DIAGNOSIS — R05 Cough: Secondary | ICD-10-CM

## 2019-06-02 DIAGNOSIS — Z20828 Contact with and (suspected) exposure to other viral communicable diseases: Secondary | ICD-10-CM

## 2019-06-02 DIAGNOSIS — R059 Cough, unspecified: Secondary | ICD-10-CM

## 2019-06-02 NOTE — ED Provider Notes (Signed)
[facility]    CSN: [id number] Arrival date & time: 06/02/19  0857      History   Chief Complaint Chief Complaint  Patient presents with  . Cough    HPI Jill Anderson is a 54 y.o. female with history of diabetes, hyperlipidemia  Presenting for Covid testing: Exposure: friend Date of exposure: Wednesday last week Any fever, symptoms since exposure: yes - bilateral eye redness, dry cough Has used Visine eyedrops without relief of symptoms.  Not taking-cough is mild.   Past Medical History:  Diagnosis Date  . Borderline diabetic   . Chronic back pain   . Diabetes mellitus without complication ([place])   . Hypercholesteremia     Patient Active Problem List   Diagnosis Date Noted  . Myalgia due to statin 12/12/2018  . Vitamin D deficiency 09/21/2017  . Insomnia 05/14/2017  . Menopausal symptoms 01/04/2017  . Urticaria 12/14/2016  . Pain in both hands 12/14/2016  . Hypertension 10/27/2016  . Bilateral anterior knee pain 03/20/2016  . Left ureteral stone 10/27/2015  . New onset of headaches after age 65 10/07/2015  . Hyperlipidemia associated with type 2 diabetes mellitus ([place]) 07/15/2015  . DM2 (diabetes mellitus, type 2) ([place]) 03/04/2015  . Chronic neck pain 09/11/2014  . Cervical disc disorder with radiculopathy of cervical region 09/11/2014  . Chronic radicular low back pain 09/11/2014    Past Surgical History:  Procedure Laterality Date  . ABDOMINAL HYSTERECTOMY    . BACK SURGERY    . CYSTOSCOPY WITH RETROGRADE PYELOGRAM, URETEROSCOPY AND STENT PLACEMENT Left 10/28/2015   Procedure: CYSTOSCOPY WITH RETROGRADE PYELOGRAM, URETEROSCOPY AND STENT PLACEMENT, AND LASER;  Surgeon: Irine Seal, MD;  Location: [facility];  Service: Urology;  Laterality: Left;    OB History   No obstetric history on file.      Home Medications    Prior to Admission medications   Medication Sig Start Date End Date Taking? Authorizing Provider  ACCU-CHEK FASTCLIX LANCETS  MISC USE AS DIRECTED 06/13/18   Charlott Rakes, MD  acetaminophen-codeine (TYLENOL #3) 300-30 MG tablet TAKE 1 TABLET BY MOUTH EVERY 12 HOURS AS NEEDED FOR MODERATE PAIN 05/16/19   Charlott Rakes, MD  aspirin EC 81 MG tablet Take 1 tablet (81 mg total) by mouth daily. 09/29/16   Funches, Adriana Mccallum, MD  Blood Glucose Monitoring Suppl (ACCU-CHEK GUIDE ME) w/Device KIT 1 each by Does not apply route daily. 06/13/18   Charlott Rakes, MD  cetirizine (ZYRTEC) 10 MG tablet Take 1 tablet (10 mg total) by mouth daily. 12/13/17   Charlott Rakes, MD  Elastic Bandages & Supports ([place] M 15-20MMHG) MISC 1 each by Does not apply route daily. 03/20/16   Funches, Adriana Mccallum, MD  ergocalciferol (DRISDOL) 50000 units capsule Take 1 capsule (50,000 Units total) by mouth once a week. 08/13/17   Charlott Rakes, MD  erythromycin ophthalmic ointment Place 1 application into the left eye every 6 (six) hours. Place 1/2 inch ribbon of ointment in the affected eye 4 times a day for 5 days 02/18/19   Margarita Mail, PA-C  famotidine (PEPCID) 20 MG tablet Take 1 tablet (20 mg total) by mouth 2 (two) times daily. 11/21/18   Hedges, Dellis Filbert, PA-C  gabapentin (NEURONTIN) 300 MG capsule Take 1 capsule (300 mg total) by mouth at bedtime. 12/12/18   Charlott Rakes, MD  glucose blood (ACCU-CHEK GUIDE) test strip Use as instructed 06/13/18   Charlott Rakes, MD  ibuprofen (ADVIL) 800 MG tablet TAKE 1 TABLET BY  MOUTH EVERY 12 HOURS AS NEEDED 05/16/19   Charlott Rakes, MD  lidocaine (LIDODERM) 5 % Place 1 patch onto the skin daily. Remove & Discard patch within 12 hours or as directed by MD. Dx: cervical and Lumbar radiculopathy 09/19/18   Charlott Rakes, MD  metFORMIN (GLUCOPHAGE) 500 MG tablet Take 2 tablets (1,000 mg total) by mouth 2 (two) times daily with a meal. 12/12/18   Charlott Rakes, MD  Misc. Devices (CANE) MISC 1 each by Does not apply route as needed. 03/04/15   Funches, Adriana Mccallum, MD  ondansetron (ZOFRAN) 4 MG tablet  Take 1 tablet (4 mg total) by mouth every 8 (eight) hours as needed for nausea or vomiting. 02/18/19   Margarita Mail, PA-C  oxyCODONE (ROXICODONE) 5 MG immediate release tablet Take 0.5-1 tablets (2.5-5 mg total) by mouth every 4 (four) hours as needed for severe pain. Patient not taking: Reported on 03/21/2019 02/18/19   Margarita Mail, PA-C  psyllium (METAMUCIL) 58.6 % powder Take 1 packet by mouth 2 (two) times daily.    [provider]  valACYclovir (VALTREX) 1000 MG tablet Take 1 tablet (1,000 mg total) by mouth 3 (three) times daily. 02/18/19   Margarita Mail, PA-C    Family History Family History  Problem Relation Age of Onset  . Diabetes Mother   . Hypertension Mother   . Heart disease Mother        based at age 59 of MI  . Diabetes Father   . Hypertension Father   . Heart disease Father   . Cancer Maternal Aunt        liver    Social History Social History   Tobacco Use  . Smoking status: Never Smoker  . Smokeless tobacco: Never Used  Substance Use Topics  . Alcohol use: No  . Drug use: No     Allergies   Atorvastatin   Review of Systems Review of Systems  Constitutional: Negative for activity change, appetite change, fatigue and fever.  HENT: Negative for congestion, dental problem, ear pain, facial swelling, hearing loss, sinus pain, sore throat, trouble swallowing and voice change.   Eyes: Positive for redness. Negative for photophobia, pain and visual disturbance.  Respiratory: Positive for cough. Negative for shortness of breath and wheezing.   Cardiovascular: Negative for chest pain and palpitations.  Gastrointestinal: Negative for diarrhea and vomiting.  Musculoskeletal: Negative for arthralgias and myalgias.  Neurological: Negative for dizziness and headaches.     Physical Exam Triage Vital Signs ED Triage Vitals [06/02/19 0904]  Enc Vitals Group     BP (!) 157/84     Pulse Rate 70     Resp 18     Temp (!) 97.5 F (36.4 C)     Temp  Source Temporal     SpO2 98 %     Weight      Height      Head Circumference      Peak Flow      Pain Score 0     Pain Loc      Pain Edu?      Excl. in [place]?    No data found.  Updated Vital Signs BP (!) 157/84 (BP Location: Left Arm)   Pulse 70   Temp (!) 97.5 F (36.4 C) (Temporal)   Resp 18   SpO2 98%   Visual Acuity Right Eye Distance:   Left Eye Distance:   Bilateral Distance:    Right Eye Near:   Left Eye Near:  Bilateral Near:     Physical Exam Constitutional:      General: She is not in acute distress.    Appearance: She is not ill-appearing.  HENT:     Head: Normocephalic and atraumatic.     Mouth/Throat:     Mouth: Mucous membranes are moist.     Pharynx: Oropharynx is clear.  Eyes:     General: No scleral icterus.    Extraocular Movements: Extraocular movements intact.     Conjunctiva/sclera: Conjunctivae normal.     Pupils: Pupils are equal, round, and reactive to light.  Cardiovascular:     Rate and Rhythm: Normal rate and regular rhythm.     Heart sounds: No murmur. No gallop.   Pulmonary:     Effort: Pulmonary effort is normal. No respiratory distress.     Breath sounds: No wheezing.  Musculoskeletal:     Cervical back: Neck supple. No tenderness.  Lymphadenopathy:     Cervical: No cervical adenopathy.  Skin:    Capillary Refill: Capillary refill takes less than 2 seconds.     Coloration: Skin is not jaundiced or pale.  Neurological:     Mental Status: She is alert and oriented to person, place, and time.      UC Treatments / Results  Labs (all labs ordered are listed, but only abnormal results are displayed) Labs Reviewed  NOVEL CORONAVIRUS, NAA    EKG   Radiology No results found.  Procedures Procedures (including critical care time)  Medications Ordered in UC Medications - No data to display  Initial Impression / Assessment and Plan / UC Course  I have reviewed the triage vital signs and the nursing notes.   Pertinent labs & imaging results that were available during my care of the patient were reviewed by me and considered in my medical decision making (see chart for details).    Patient afebrile, nontoxic, with SpO2 98%.  Covid PCR pending.  Patient to quarantine until results are back.  We will continue supportive management.  Return precautions discussed, patient verbalized understanding and is agreeable to plan. Final Clinical Impressions(s) / UC Diagnoses   Final diagnoses:  Cough     Discharge Instructions     Your COVID test is pending - it is important to quarantine / isolate at home until your results are back. If you test positive and would like further evaluation for persistent or worsening symptoms, you may schedule an E-visit or virtual (video) visit throughout the [facility] app or website.  PLEASE NOTE: If you develop severe chest pain or shortness of breath please go to the ER or call [phone] for further evaluation --> DO NOT schedule electronic or virtual visits for this. Please call our office for further guidance / recommendations as needed.    ED Prescriptions    None     PDMP not reviewed this encounter.   [place], [place], [place] 06/02/19 1605

## 2019-06-02 NOTE — ED Triage Notes (Signed)
Pt presents to [facility] for assessment of cough and bilateral eye redness x 3 days.  Wanting COVID [employer]

## 2019-06-02 NOTE — Discharge Instructions (Signed)
Your COVID test is pending - it is important to quarantine / isolate at home until your results are back. °If you test positive and would like further evaluation for persistent or worsening symptoms, you may schedule an E-visit or virtual (video) visit throughout the [facility] MyChart app or website. ° °PLEASE NOTE: If you develop severe chest pain or shortness of breath please go to the ER or call 9-1-1 for further evaluation --> DO NOT schedule electronic or virtual visits for this. °Please call our office for further guidance / recommendations as needed. °

## 2019-06-04 LAB — NOVEL CORONAVIRUS, NAA: SARS-CoV-2, NAA: NOT DETECTED

## 2019-06-21 ENCOUNTER — Encounter: Payer: Self-pay | Admitting: Family Medicine

## 2019-06-21 ENCOUNTER — Other Ambulatory Visit: Payer: Self-pay

## 2019-06-21 ENCOUNTER — Ambulatory Visit: Payer: Medicare HMO | Attending: Family Medicine | Admitting: Family Medicine

## 2019-06-21 VITALS — BP 143/80 | HR 64 | Ht 62.0 in | Wt 167.0 lb

## 2019-06-21 DIAGNOSIS — M5416 Radiculopathy, lumbar region: Secondary | ICD-10-CM

## 2019-06-21 DIAGNOSIS — M5010 Cervical disc disorder with radiculopathy, unspecified cervical region: Secondary | ICD-10-CM | POA: Diagnosis not present

## 2019-06-21 DIAGNOSIS — G8929 Other chronic pain: Secondary | ICD-10-CM

## 2019-06-21 DIAGNOSIS — E1169 Type 2 diabetes mellitus with other specified complication: Secondary | ICD-10-CM | POA: Diagnosis not present

## 2019-06-21 DIAGNOSIS — Z8619 Personal history of other infectious and parasitic diseases: Secondary | ICD-10-CM

## 2019-06-21 DIAGNOSIS — E119 Type 2 diabetes mellitus without complications: Secondary | ICD-10-CM

## 2019-06-21 LAB — GLUCOSE, POCT (MANUAL RESULT ENTRY): POC Glucose: 87 mg/dl (ref 70–99)

## 2019-06-21 MED ORDER — SHINGRIX 50 MCG/0.5ML IM SUSR: 0.5000 mL | Freq: Once | INTRAMUSCULAR | 1 refills | Status: AC

## 2019-06-21 MED ORDER — IBUPROFEN 800 MG PO TABS: 800.0000 mg | ORAL_TABLET | Freq: Two times a day (BID) | ORAL | 2 refills | Status: DC | PRN

## 2019-06-21 MED ORDER — GABAPENTIN 300 MG PO CAPS: 300.0000 mg | ORAL_CAPSULE | Freq: Every day | ORAL | 6 refills | Status: DC

## 2019-06-21 MED ORDER — METFORMIN HCL 500 MG PO TABS: 1000.0000 mg | ORAL_TABLET | Freq: Two times a day (BID) | ORAL | 6 refills | Status: DC

## 2019-06-21 NOTE — Progress Notes (Signed)
Subjective:  Patient ID: Jill Anderson, female    DOB: 01/17/1966  Age: 54 y.o. MRN: [id number]  CC: Diabetes   HPI Jill Anderson is a 54 year old female with a history of Type 2 Diabetes Mellitus (A1c 6.3), Hypercholesterolemia, chronic neck and chronic low back pain secondary to degenerative changes of the spine (declines surgical management), Insomnia who presents today for follow-up visit   For her back she uses Tylenol #3 as needed along with Ibuprofen. Previously used CBD oil but this has become too expensive for her. She had initially said se wanted a repeat lumbar spine MRI however she has changed her mind as she would not want to go to the hospital and be uncecessarily exposed to COVID-19. Low back pain radiates intermittently down both legs. Declines recreational drug use.  Not taking Metformiin as she gets 89-112 when she checks her blood sugars.She has been doing a lot of vegetables.Also exercising. Would like a rx for Shingles vaccine sent to her Pharmacy  Past Medical History:  Diagnosis Date  . Borderline diabetic   . Chronic back pain   . Diabetes mellitus without complication ([place])   . Hypercholesteremia     Past Surgical History:  Procedure Laterality Date  . ABDOMINAL HYSTERECTOMY    . BACK SURGERY    . CYSTOSCOPY WITH RETROGRADE PYELOGRAM, URETEROSCOPY AND STENT PLACEMENT Left 10/28/2015   Procedure: CYSTOSCOPY WITH RETROGRADE PYELOGRAM, URETEROSCOPY AND STENT PLACEMENT, AND LASER;  Surgeon: Irine Seal, MD;  Location: [facility];  Service: Urology;  Laterality: Left;    Family History  Problem Relation Age of Onset  . Diabetes Mother   . Hypertension Mother   . Heart disease Mother        based at age 31 of MI  . Diabetes Father   . Hypertension Father   . Heart disease Father   . Cancer Maternal Aunt        liver    Allergies  Allergen Reactions  . Atorvastatin Other (See Comments)    myalgia    Outpatient Medications Prior to Visit  Medication Sig  Dispense Refill  . ACCU-CHEK FASTCLIX LANCETS MISC USE AS DIRECTED 102 each 12  . acetaminophen-codeine (TYLENOL #3) 300-30 MG tablet TAKE 1 TABLET BY MOUTH EVERY 12 HOURS AS NEEDED FOR MODERATE PAIN 60 tablet 0  . aspirin EC 81 MG tablet Take 1 tablet (81 mg total) by mouth daily. 30 tablet 11  . Blood Glucose Monitoring Suppl (ACCU-CHEK GUIDE ME) w/Device KIT 1 each by Does not apply route daily. 1 kit 0  . cetirizine (ZYRTEC) 10 MG tablet Take 1 tablet (10 mg total) by mouth daily. 30 tablet 1  . Elastic Bandages & Supports (TRUFORM ARM 1 M 15-20MMHG) MISC 1 each by Does not apply route daily. 1 each 0  . ergocalciferol (DRISDOL) 50000 units capsule Take 1 capsule (50,000 Units total) by mouth once a week. 9 capsule 1  . erythromycin ophthalmic ointment Place 1 application into the left eye every 6 (six) hours. Place 1/2 inch ribbon of ointment in the affected eye 4 times a day for 5 days 3.5 g 0  . famotidine (PEPCID) 20 MG tablet Take 1 tablet (20 mg total) by mouth 2 (two) times daily. 30 tablet 0  . gabapentin (NEURONTIN) 300 MG capsule Take 1 capsule (300 mg total) by mouth at bedtime. 30 capsule 6  . glucose blood (ACCU-CHEK GUIDE) test strip Use as instructed 100 each 12  . ibuprofen (ADVIL) 800  MG tablet TAKE 1 TABLET BY MOUTH EVERY 12 HOURS AS NEEDED 60 tablet 0  . metFORMIN (GLUCOPHAGE) 500 MG tablet Take 2 tablets (1,000 mg total) by mouth 2 (two) times daily with a meal. 120 tablet 6  . Misc. Devices (CANE) MISC 1 each by Does not apply route as needed. 1 each 0  . ondansetron (ZOFRAN) 4 MG tablet Take 1 tablet (4 mg total) by mouth every 8 (eight) hours as needed for nausea or vomiting. 10 tablet 0  . psyllium (METAMUCIL) 58.6 % powder Take 1 packet by mouth 2 (two) times daily.    . valACYclovir (VALTREX) 1000 MG tablet Take 1 tablet (1,000 mg total) by mouth 3 (three) times daily. 21 tablet 0  . lidocaine (LIDODERM) 5 % Place 1 patch onto the skin daily. Remove & Discard  patch within 12 hours or as directed by MD. Dx: cervical and Lumbar radiculopathy 30 patch 1  . oxyCODONE (ROXICODONE) 5 MG immediate release tablet Take 0.5-1 tablets (2.5-5 mg total) by mouth every 4 (four) hours as needed for severe pain. (Patient not taking: Reported on 03/21/2019) 12 tablet 0   No facility-administered medications prior to visit.     ROS Review of Systems  Constitutional: Negative for activity change, appetite change and fatigue.  HENT: Negative for congestion, sinus pressure and sore throat.   Eyes: Negative for visual disturbance.  Respiratory: Negative for cough, chest tightness, shortness of breath and wheezing.   Cardiovascular: Negative for chest pain and palpitations.  Gastrointestinal: Negative for abdominal distention, abdominal pain and constipation.  Endocrine: Negative for polydipsia.  Genitourinary: Negative for dysuria and frequency.  Musculoskeletal: Positive for back pain. Negative for arthralgias.  Skin: Negative for rash.  Neurological: Negative for tremors, light-headedness and numbness.  Hematological: Does not bruise/bleed easily.  Psychiatric/Behavioral: Negative for agitation and behavioral problems.    Objective:  BP (!) 143/80   Pulse 64   Ht _0  (1.575 m)   Wt 167 lb (75.8 kg)   SpO2 99%   BMI 30.54 kg/m   BP/Weight 06/21/2019 06/02/2019 3/53/2992  Systolic BP 426 834 196  Diastolic BP 80 84 64  Wt. (Lbs) 167 - 170  BMI 30.54 - 31.09      Physical Exam Constitutional:      Appearance: She is well-developed.  Neck:     Vascular: No JVD.  Cardiovascular:     Rate and Rhythm: Normal rate.     Heart sounds: Normal heart sounds. No murmur.  Pulmonary:     Effort: Pulmonary effort is normal.     Breath sounds: Normal breath sounds. No wheezing or rales.  Chest:     Chest wall: No tenderness.  Abdominal:     General: Bowel sounds are normal. There is no distension.     Palpations: Abdomen is soft. There is no mass.      Tenderness: There is no abdominal tenderness.  Musculoskeletal:        General: Normal range of motion.     Right lower leg: No edema.     Left lower leg: No edema.     Comments: TTP of lumbar spine Positive straight leg raise bilaterally  Neurological:     Mental Status: She is alert and oriented to person, place, and time.  Psychiatric:        Mood and Affect: Mood normal.     CMP Latest Ref Rng & Units 05/01/2019 02/18/2019 02/15/2019  Glucose 65 - 99 mg/dL 146(H) 120(H)  116(H)  BUN 6 - 24 mg/dL _0 Creatinine 0.57 - 1.00 mg/dL 0.65 0.97 0.76  Sodium 134 - 144 mmol/L 140 138 140  Potassium 3.5 - 5.2 mmol/L 4.6 3.7 4.1  Chloride 96 - 106 mmol/L 103 104 100  CO2 20 - 29 mmol/L _1 Calcium 8.7 - 10.2 mg/dL 9.6 9.0 9.9  Total Protein 6.0 - 8.5 g/dL 7.6 7.2 8.2  Total Bilirubin 0.0 - 1.2 mg/dL 0.7 1.5(H) 1.0  Alkaline Phos 39 - 117 IU/L 79 50 66  AST 0 - 40 IU/L _2 ALT 0 - 32 IU/L _3 Lipid Panel     Component Value Date/Time   CHOL 202 (H) 05/01/2019 0845   TRIG 195 (H) 05/01/2019 0845   HDL 52 05/01/2019 0845   CHOLHDL 3.9 05/01/2019 0845   CHOLHDL 4.8 07/12/2015 1008   VLDL 14 07/12/2015 1008   LDLCALC 116 (H) 05/01/2019 0845    CBC    Component Value Date/Time   WBC 7.8 02/18/2019 1433   RBC 4.70 02/18/2019 1433   HGB 13.9 02/18/2019 1433   HGB 14.9 02/15/2019 1714   HCT 43.4 02/18/2019 1433   HCT 46.3 02/15/2019 1714   PLT 308 02/18/2019 1433   PLT 332 02/15/2019 1714   MCV 92.3 02/18/2019 1433   MCV 94 02/15/2019 1714   MCH 29.6 02/18/2019 1433   MCHC 32.0 02/18/2019 1433   RDW 11.8 02/18/2019 1433   RDW 12.1 02/15/2019 1714   LYMPHSABS 3.1 02/18/2019 1433   LYMPHSABS 3.0 08/12/2017 1050   MONOABS 0.6 02/18/2019 1433   EOSABS 0.4 02/18/2019 1433   EOSABS 0.3 08/12/2017 1050   BASOSABS 0.1 02/18/2019 1433   BASOSABS 0.0 08/12/2017 1050    Lab Results  Component Value Date   HGBA1C 6.3 (H) 05/01/2019    Assessment &  Plan:    1. Cervical disc disorder with radiculopathy of cervical region Stable - gabapentin (NEURONTIN) 300 MG capsule; Take 1 capsule (300 mg total) by mouth at bedtime.  Dispense: 30 capsule; Refill: 6 - ibuprofen (ADVIL) 800 MG tablet; Take 1 tablet (800 mg total) by mouth every 12 (twelve) hours as needed.  Dispense: 60 tablet; Refill: 2  2. Chronic radicular low back pain Uncontrolled Declines surgical intervention PDMP reviewed - in compliance Continue Tylenol #3 prn - ibuprofen (ADVIL) 800 MG tablet; Take 1 tablet (800 mg total) by mouth every 12 (twelve) hours as needed.  Dispense: 60 tablet; Refill: 2  3. Type 2 diabetes mellitus with other specified complication, without long-term current use of insulin (HCC) Diet controlled Counseled on Diabetic diet, my plate method, 093 minutes of moderate intensity exercise/week Blood sugar logs with fasting goals of 80-120 mg/dl, random of less than 180 and in the event of sugars less than 60 mg/dl or greater than 400 mg/dl encouraged to notify the clinic. Advised on the need for annual eye exams, annual foot exams, Pneumonia vaccine. - POCT glucose (manual entry) - Ambulatory referral to Ophthalmology - metFORMIN (GLUCOPHAGE) 500 MG tablet; Take 2 tablets (1,000 mg total) by mouth 2 (two) times daily with a meal.  Dispense: 120 tablet; Refill: 6 - Microalbumin / creatinine urine ratio - CMP14+EGFR - Lipid panel  4. History of herpes zoster Prescription for vaccine sent over to her Pharmacy    Return in about 3 months (around 09/19/2019) for Chronic medical conditions-virtual.     Charlott Rakes, MD, FAAFP. [facility] [place], [place]   06/21/2019, 2:46 PM

## 2019-06-21 NOTE — Patient Instructions (Signed)
Zoster Vaccine, Recombinant injection What is this medicine? ZOSTER VACCINE (ZOS ter vak SEEN) is used to prevent shingles in adults 55 years old and over. This vaccine is not used to treat shingles or nerve pain from shingles. This medicine may be used for other purposes; ask your health care provider or pharmacist if you have questions. COMMON BRAND NAME(S): SHINGRIX What should I tell my health care provider before I take this medicine? They need to know if you have any of these conditions:  blood disorders or disease  cancer like leukemia or lymphoma  immune system problems or therapy  an unusual or allergic reaction to vaccines, other medications, foods, dyes, or preservatives  pregnant or trying to get pregnant  breast-feeding How should I use this medicine? This vaccine is for injection in a muscle. It is given by a health care professional. Talk to your pediatrician regarding the use of this medicine in children. This medicine is not approved for use in children. Overdosage: If you think you have taken too much of this medicine contact a poison control center or emergency room at once. NOTE: This medicine is only for you. Do not share this medicine with others. What if I miss a dose? Keep appointments for follow-up (booster) doses as directed. It is important not to miss your dose. Call your doctor or health care professional if you are unable to keep an appointment. What may interact with this medicine?  medicines that suppress your immune system  medicines to treat cancer  steroid medicines like prednisone or cortisone This list may not describe all possible interactions. Give your health care provider a list of all the medicines, herbs, non-prescription drugs, or dietary supplements you use. Also tell them if you smoke, drink alcohol, or use illegal drugs. Some items may interact with your medicine. What should I watch for while using this medicine? Visit your doctor for  regular check ups. This vaccine, like all vaccines, may not fully protect everyone. What side effects may I notice from receiving this medicine? Side effects that you should report to your doctor or health care professional as soon as possible:  allergic reactions like skin rash, itching or hives, swelling of the face, lips, or tongue  breathing problems Side effects that usually do not require medical attention (report these to your doctor or health care professional if they continue or are bothersome):  chills  headache  fever  nausea, vomiting  redness, warmth, pain, swelling or itching at site where injected  tiredness This list may not describe all possible side effects. Call your doctor for medical advice about side effects. You may report side effects to FDA at 1-800-FDA-1088. Where should I keep my medicine? This vaccine is only given in a clinic, pharmacy, doctor's office, or other health care setting and will not be stored at home. NOTE: This sheet is a summary. It may not cover all possible information. If you have questions about this medicine, talk to your doctor, pharmacist, or health care provider.  2020 Elsevier/Gold Standard (2016-12-28 13:20:30)  

## 2019-06-21 NOTE — Progress Notes (Signed)
Requesting another MRI.

## 2019-06-22 ENCOUNTER — Encounter: Payer: Self-pay | Admitting: Family Medicine

## 2019-06-22 LAB — CMP14+EGFR
ALT: 22 IU/L (ref 0–32)
AST: 22 IU/L (ref 0–40)
Albumin/Globulin Ratio: 1.2 (ref 1.2–2.2)
Albumin: 4.6 g/dL (ref 3.8–4.9)
Alkaline Phosphatase: 62 IU/L (ref 39–117)
BUN/Creatinine Ratio: 14 (ref 9–23)
BUN: 10 mg/dL (ref 6–24)
Bilirubin Total: 0.8 mg/dL (ref 0.0–1.2)
CO2: 26 mmol/L (ref 20–29)
Calcium: 9.9 mg/dL (ref 8.7–10.2)
Chloride: 99 mmol/L (ref 96–106)
Creatinine, Ser: 0.69 mg/dL (ref 0.57–1.00)
GFR calc Af Amer: 114 mL/min/{1.73_m2} (ref >59)
GFR calc non Af Amer: 99 mL/min/{1.73_m2} (ref >59)
Globulin, Total: 3.7 g/dL (ref 1.5–4.5)
Glucose: 85 mg/dL (ref 65–99)
Potassium: 4.2 mmol/L (ref 3.5–5.2)
Sodium: 140 mmol/L (ref 134–144)
Total Protein: 8.3 g/dL (ref 6.0–8.5)

## 2019-06-22 LAB — MICROALBUMIN / CREATININE URINE RATIO
Creatinine, Urine: 114.1 mg/dL
Microalb/Creat Ratio: 6 mg/g creat (ref 0–29)
Microalbumin, Urine: 6.5 ug/mL

## 2019-06-22 LAB — LIPID PANEL
Chol/HDL Ratio: 3.9 ratio (ref 0.0–4.4)
Cholesterol, Total: 234 mg/dL — ABNORMAL HIGH (ref 100–199)
HDL: 60 mg/dL (ref >39)
LDL Chol Calc (NIH): 158 mg/dL — ABNORMAL HIGH (ref 0–99)
Triglycerides: 90 mg/dL (ref 0–149)
VLDL Cholesterol Cal: 16 mg/dL (ref 5–40)

## 2019-09-07 ENCOUNTER — Ambulatory Visit: Payer: Medicare HMO | Attending: Internal Medicine

## 2019-09-07 ENCOUNTER — Ambulatory Visit: Payer: Medicare HMO

## 2019-09-07 DIAGNOSIS — Z23 Encounter for immunization: Secondary | ICD-10-CM

## 2019-09-07 NOTE — Progress Notes (Signed)
   Covid-19 Vaccination Clinic  Name:  Jill Anderson    MRN: [id number] DOB: [birth year]  09/07/2019  Jill Anderson was observed post Covid-19 immunization for 15 minutes without incident. She was provided with Vaccine Information Sheet and instruction to access the V-Safe system.   Jill Anderson was instructed to call 911 with any severe reactions post vaccine: Marland Kitchen Difficulty breathing  . Swelling of face and throat  . A fast heartbeat  . A bad rash all over body  . Dizziness and weakness   Immunizations Administered    Name Date Dose VIS Date Route   Pfizer COVID-19 Vaccine 09/07/2019 12:29 PM 0.3 mL 05/12/2019 Intramuscular   Manufacturer: [employer], [employer]   Lot: [id number]   NDC: [address]-0

## 2019-09-11 ENCOUNTER — Other Ambulatory Visit: Payer: Self-pay | Admitting: Family Medicine

## 2019-09-11 DIAGNOSIS — M5010 Cervical disc disorder with radiculopathy, unspecified cervical region: Secondary | ICD-10-CM

## 2019-09-11 DIAGNOSIS — G8929 Other chronic pain: Secondary | ICD-10-CM

## 2019-09-15 ENCOUNTER — Telehealth: Payer: Self-pay

## 2019-09-15 NOTE — Telephone Encounter (Signed)
PRIOR AUTHORIZATION FOR ACETAMINOPHEN/COD #3 APPROVED THRU 05/31/20

## 2019-09-19 ENCOUNTER — Other Ambulatory Visit: Payer: Self-pay

## 2019-09-19 ENCOUNTER — Ambulatory Visit: Payer: Medicare HMO | Attending: Family Medicine | Admitting: Family Medicine

## 2019-09-19 ENCOUNTER — Encounter: Payer: Self-pay | Admitting: Family Medicine

## 2019-09-19 DIAGNOSIS — M5010 Cervical disc disorder with radiculopathy, unspecified cervical region: Secondary | ICD-10-CM | POA: Diagnosis not present

## 2019-09-19 DIAGNOSIS — E785 Hyperlipidemia, unspecified: Secondary | ICD-10-CM

## 2019-09-19 DIAGNOSIS — M5416 Radiculopathy, lumbar region: Secondary | ICD-10-CM | POA: Diagnosis not present

## 2019-09-19 DIAGNOSIS — E1169 Type 2 diabetes mellitus with other specified complication: Secondary | ICD-10-CM

## 2019-09-19 DIAGNOSIS — G8929 Other chronic pain: Secondary | ICD-10-CM | POA: Diagnosis not present

## 2019-09-19 NOTE — Progress Notes (Signed)
Pain in back and neck. Has stopped Metformin due to diet change.

## 2019-09-19 NOTE — Progress Notes (Signed)
Virtual Visit via Telephone Note  I connected with Jill Anderson, on 09/19/2019 at 9:40 AM by telephone due to the COVID-19 pandemic and verified that I am speaking with the correct person using two identifiers.   Consent: I discussed the limitations, risks, security and privacy concerns of performing an evaluation and management service by telephone and the availability of in person appointments. I also discussed with the patient that there may be a patient responsible charge related to this service. The patient expressed understanding and agreed to proceed.   Location of Patient: Home  Location of Provider: Clinic   Persons participating in Telemedicine visit: Emalie Mcwethy Farrington-CMA Dr. Margarita Rana     History of Present Illness: Jill Anderson is a 54year old female with a history of Type 2 Diabetes Mellitus (A1c 6.3), Hypercholesterolemia, chronic neck and chronic low back pain secondary to degenerative changes of the spine (declines surgical management), Insomnia who presents today for follow-up visit   Her neck and back have been bothering her and Tylenol#3 (she takes 1 every other day) has been helpful alternating with Ibuprofen 863m. She uses natural herbs for constipation. Neck pain radiates to both hands L>R. PT has been ineffective in the past.  She is not taking Metformin but does a blend of vegetables. She takes Kava tea which helps her sleep and has helped her with her stress. Fasting sugars are 92-112. Unable to tolerate a Statin due to myalgia; last lipid panel was elevated but she attributes this to eating a lot of beef jerky which she has cut back on.  Past Medical History:  Diagnosis Date  . Borderline diabetic   . Chronic back pain   . Diabetes mellitus without complication ([place]   . Hypercholesteremia    Allergies  Allergen Reactions  . Atorvastatin Other (See Comments)    myalgia    Current Outpatient Medications on File Prior to Visit   Medication Sig Dispense Refill  . ACCU-CHEK FASTCLIX LANCETS MISC USE AS DIRECTED 102 each 12  . acetaminophen-codeine (TYLENOL #3) 300-30 MG tablet TAKE 1 TABLET BY MOUTH EVERY 12 HOURS AS NEEDED FOR MODERATE PAIN 60 tablet 0  . aspirin EC 81 MG tablet Take 1 tablet (81 mg total) by mouth daily. 30 tablet 11  . Blood Glucose Monitoring Suppl (ACCU-CHEK GUIDE ME) w/Device KIT 1 each by Does not apply route daily. 1 kit 0  . cetirizine (ZYRTEC) 10 MG tablet Take 1 tablet (10 mg total) by mouth daily. 30 tablet 1  . Elastic Bandages & Supports (TRUFORM ARM S49M 15-20MMHG) MISC 1 each by Does not apply route daily. 1 each 0  . erythromycin ophthalmic ointment Place 1 application into the left eye every 6 (six) hours. Place 1/2 inch ribbon of ointment in the affected eye 4 times a day for 5 days 3.5 g 0  . famotidine (PEPCID) 20 MG tablet Take 1 tablet (20 mg total) by mouth 2 (two) times daily. 30 tablet 0  . gabapentin (NEURONTIN) 300 MG capsule Take 1 capsule (300 mg total) by mouth at bedtime. 30 capsule 6  . glucose blood (ACCU-CHEK GUIDE) test strip Use as instructed 100 each 12  . ibuprofen (ADVIL) 800 MG tablet Take 1 tablet (800 mg total) by mouth every 12 (twelve) hours as needed. 60 tablet 2  . Misc. Devices (CANE) MISC 1 each by Does not apply route as needed. 1 each 0  . ondansetron (ZOFRAN) 4 MG tablet Take 1 tablet (4 mg total) by  mouth every 8 (eight) hours as needed for nausea or vomiting. 10 tablet 0  . psyllium (METAMUCIL) 58.6 % powder Take 1 packet by mouth 2 (two) times daily.    . valACYclovir (VALTREX) 1000 MG tablet Take 1 tablet (1,000 mg total) by mouth 3 (three) times daily. 21 tablet 0  . lidocaine (LIDODERM) 5 % Place 1 patch onto the skin daily. Remove & Discard patch within 12 hours or as directed by MD. Dx: cervical and Lumbar radiculopathy 30 patch 1  . metFORMIN (GLUCOPHAGE) 500 MG tablet Take 2 tablets (1,000 mg total) by mouth 2 (two) times daily with a meal.  (Patient not taking: Reported on 09/19/2019) 120 tablet 6  . oxyCODONE (ROXICODONE) 5 MG immediate release tablet Take 0.5-1 tablets (2.5-5 mg total) by mouth every 4 (four) hours as needed for severe pain. (Patient not taking: Reported on 03/21/2019) 12 tablet 0   No current facility-administered medications on file prior to visit.    Observations/Objective: Alert, awake, oriented x3 Not in acute distress  CMP Latest Ref Rng & Units 06/21/2019 05/01/2019 02/18/2019  Glucose 65 - 99 mg/dL 85 146(H) 120(H)  BUN 6 - 24 mg/dL 10 13 15   Creatinine 0.57 - 1.00 mg/dL 0.69 0.65 0.97  Sodium 134 - 144 mmol/L 140 140 138  Potassium 3.5 - 5.2 mmol/L 4.2 4.6 3.7  Chloride 96 - 106 mmol/L 99 103 104  CO2 20 - 29 mmol/L 26 25 27   Calcium 8.7 - 10.2 mg/dL 9.9 9.6 9.0  Total Protein 6.0 - 8.5 g/dL 8.3 7.6 7.2  Total Bilirubin 0.0 - 1.2 mg/dL 0.8 0.7 1.5(H)  Alkaline Phos 39 - 117 IU/L 62 79 50  AST 0 - 40 IU/L 22 18 26   ALT 0 - 32 IU/L 22 24 30     Lipid Panel     Component Value Date/Time   CHOL 234 (H) 06/21/2019 1518   TRIG 90 06/21/2019 1518   HDL 60 06/21/2019 1518   CHOLHDL 3.9 06/21/2019 1518   CHOLHDL 4.8 07/12/2015 1008   VLDL 14 07/12/2015 1008   LDLCALC 158 (H) 06/21/2019 1518   LABVLDL 16 06/21/2019 1518    Lab Results  Component Value Date   HGBA1C 6.3 (H) 05/01/2019    Assessment and Plan: 1. Type 2 diabetes mellitus with other specified complication, without long-term current use of insulin (HCC) Diet controlled with A1c of 6.3 Discontinue Metformin as per patient request Counseled on Diabetic diet, my plate method, 782 minutes of moderate intensity exercise/week Blood sugar logs with fasting goals of 80-120 mg/dl, random of less than 180 and in the event of sugars less than 60 mg/dl or greater than 400 mg/dl encouraged to notify the clinic. Advised on the need for annual eye exams, annual foot exams, Pneumonia vaccine.  2. Cervical disc disorder with radiculopathy of  cervical region Stable with intermittent flares Declines PT or surgical intervention Continue muscle relaxant, NSAID, Tylenol 3 PDMP reviewed -no aberrant behavior detected  3. Hyperlipidemia associated with type 2 diabetes mellitus ([place]) Uncontrolled Unable to tolerate statin Continue low-cholesterol diet  4. Chronic radicular low back pain See #2 above   Follow Up Instructions: 3 months for chronic disease management   I discussed the assessment and treatment plan with the patient. The patient was provided an opportunity to ask questions and all were answered. The patient agreed with the plan and demonstrated an understanding of the instructions.   The patient was advised to call back or seek an in-person  evaluation if the symptoms worsen or if the condition fails to improve as anticipated.     I provided 16 minutes total of non-face-to-face time during this encounter including median intraservice time, reviewing previous notes, investigations, ordering medications, medical decision making, coordinating care and patient verbalized understanding at the end of the visit.     Charlott Rakes, MD, FAAFP. [facility] and [place], [place]   09/19/2019, 9:40 AM

## 2019-09-22 ENCOUNTER — Other Ambulatory Visit: Payer: Self-pay | Admitting: Family Medicine

## 2019-09-22 DIAGNOSIS — Z1231 Encounter for screening mammogram for malignant neoplasm of breast: Secondary | ICD-10-CM

## 2019-09-28 ENCOUNTER — Ambulatory Visit: Payer: Medicare HMO

## 2019-09-29 ENCOUNTER — Ambulatory Visit (HOSPITAL_COMMUNITY)
Admission: EM | Admit: 2019-09-29 | Discharge: 2019-09-29 | Disposition: A | Discharge status: Home / Self Care | Payer: Medicare HMO

## 2019-09-29 ENCOUNTER — Ambulatory Visit
Admission: EM | Admit: 2019-09-29 | Discharge: 2019-09-29 | Disposition: A | Discharge status: Home / Self Care | Payer: Medicare HMO | Attending: Physician Assistant | Admitting: Physician Assistant

## 2019-09-29 DIAGNOSIS — R0981 Nasal congestion: Secondary | ICD-10-CM | POA: Diagnosis not present

## 2019-09-29 DIAGNOSIS — R05 Cough: Secondary | ICD-10-CM | POA: Diagnosis not present

## 2019-09-29 DIAGNOSIS — R059 Cough, unspecified: Secondary | ICD-10-CM

## 2019-09-29 MED ORDER — AZELASTINE HCL 0.1 % NA SOLN: 2.0000 | Freq: Two times a day (BID) | NASAL | 0 refills | Status: DC

## 2019-09-29 MED ORDER — BENZONATATE 200 MG PO CAPS: 200.0000 mg | ORAL_CAPSULE | Freq: Three times a day (TID) | ORAL | 0 refills | Status: DC

## 2019-09-29 MED ORDER — FLUTICASONE PROPIONATE 50 MCG/ACT NA SUSP: 2.0000 | Freq: Every day | NASAL | 0 refills | Status: DC

## 2019-09-29 NOTE — ED Triage Notes (Signed)
Pt c/o cough, congestion, sore throat, headache, chest pain and diarrhea since Monday. States just came back from [place] 2 days ago. States has had first covid vaccine.

## 2019-09-29 NOTE — Discharge Instructions (Signed)
COVID PCR testing ordered. I would like you to quarantine until testing results. Flonase and azelastine as directed. Tessalon for cough if needed. Tylenol/motrin for pain and fever. Keep hydrated, urine should be clear to pale yellow in color. If experiencing shortness of breath, trouble breathing, go to the emergency department for further evaluation needed.

## 2019-09-29 NOTE — ED Provider Notes (Signed)
[facility]    CSN: [id number] Arrival date & time: 09/29/19  0806      History   Chief Complaint Chief Complaint  Patient presents with  . Cough    HPI Jill Anderson is a 54 y.o. female.   54 year old female with history of noninsulin dependant DM, HLD comes in for 5 day of URI symptoms. Cough, congestion, sore throat, headaches. Chest pressure when in certain positions. Body aches, diarrhea. Denies fever, chills, body aches. Denies abdominal pain, nausea, vomiting. Denies shortness of breath, loss of taste/smell. First COVID vaccine 08/2019. Never smoker. No medications for the symptoms.      Past Medical History:  Diagnosis Date  . Borderline diabetic   . Chronic back pain   . Diabetes mellitus without complication ([place])   . Hypercholesteremia     Patient Active Problem List   Diagnosis Date Noted  . Myalgia due to statin 12/12/2018  . Vitamin D deficiency 09/21/2017  . Insomnia 05/14/2017  . Menopausal symptoms 01/04/2017  . Urticaria 12/14/2016  . Pain in both hands 12/14/2016  . Hypertension 10/27/2016  . Bilateral anterior knee pain 03/20/2016  . Left ureteral stone 10/27/2015  . New onset of headaches after age 36 10/07/2015  . Hyperlipidemia associated with type 2 diabetes mellitus ([place]) 07/15/2015  . DM2 (diabetes mellitus, type 2) ([place]) 03/04/2015  . Chronic neck pain 09/11/2014  . Cervical disc disorder with radiculopathy of cervical region 09/11/2014  . Chronic radicular low back pain 09/11/2014    Past Surgical History:  Procedure Laterality Date  . ABDOMINAL HYSTERECTOMY    . BACK SURGERY    . CYSTOSCOPY WITH RETROGRADE PYELOGRAM, URETEROSCOPY AND STENT PLACEMENT Left 10/28/2015   Procedure: CYSTOSCOPY WITH RETROGRADE PYELOGRAM, URETEROSCOPY AND STENT PLACEMENT, AND LASER;  Surgeon: Irine Seal, MD;  Location: [facility];  Service: Urology;  Laterality: Left;    OB History   No obstetric history on file.      Home Medications     Prior to Admission medications   Medication Sig Start Date End Date Taking? Authorizing Provider  ACCU-CHEK FASTCLIX LANCETS MISC USE AS DIRECTED 06/13/18   Charlott Rakes, MD  acetaminophen-codeine (TYLENOL #3) 300-30 MG tablet TAKE 1 TABLET BY MOUTH EVERY 12 HOURS AS NEEDED FOR MODERATE PAIN 09/13/19   Charlott Rakes, MD  aspirin EC 81 MG tablet Take 1 tablet (81 mg total) by mouth daily. 09/29/16   Funches, Adriana Mccallum, MD  azelastine (ASTELIN) 0.1 % nasal spray Place 2 sprays into both nostrils 2 (two) times daily. 09/29/19   Tasia Catchings, Kaneisha Ellenberger V, PA-C  benzonatate (TESSALON) 200 MG capsule Take 1 capsule (200 mg total) by mouth every 8 (eight) hours. 09/29/19   Tasia Catchings, Nekoda Chock V, PA-C  Blood Glucose Monitoring Suppl (ACCU-CHEK GUIDE ME) w/Device KIT 1 each by Does not apply route daily. 06/13/18   Charlott Rakes, MD  cetirizine (ZYRTEC) 10 MG tablet Take 1 tablet (10 mg total) by mouth daily. 12/13/17   Charlott Rakes, MD  Elastic Bandages & Supports ([place] M 15-20MMHG) MISC 1 each by Does not apply route daily. 03/20/16   Funches, Adriana Mccallum, MD  erythromycin ophthalmic ointment Place 1 application into the left eye every 6 (six) hours. Place 1/2 inch ribbon of ointment in the affected eye 4 times a day for 5 days 02/18/19   Margarita Mail, PA-C  famotidine (PEPCID) 20 MG tablet Take 1 tablet (20 mg total) by mouth 2 (two) times daily. 11/21/18   Okey Regal,  PA-C  fluticasone (FLONASE) 50 MCG/ACT nasal spray Place 2 sprays into both nostrils daily. 09/29/19   Tasia Catchings, Kelcy Laible V, PA-C  gabapentin (NEURONTIN) 300 MG capsule Take 1 capsule (300 mg total) by mouth at bedtime. 06/21/19   Charlott Rakes, MD  glucose blood (ACCU-CHEK GUIDE) test strip Use as instructed 06/13/18   Charlott Rakes, MD  ibuprofen (ADVIL) 800 MG tablet Take 1 tablet (800 mg total) by mouth every 12 (twelve) hours as needed. 06/21/19   Charlott Rakes, MD  lidocaine (LIDODERM) 5 % Place 1 patch onto the skin daily. Remove & Discard patch  within 12 hours or as directed by MD. Dx: cervical and Lumbar radiculopathy 09/19/18   Charlott Rakes, MD  Misc. Devices (CANE) MISC 1 each by Does not apply route as needed. 03/04/15   Funches, Adriana Mccallum, MD  ondansetron (ZOFRAN) 4 MG tablet Take 1 tablet (4 mg total) by mouth every 8 (eight) hours as needed for nausea or vomiting. 02/18/19   Margarita Mail, PA-C  psyllium (METAMUCIL) 58.6 % powder Take 1 packet by mouth 2 (two) times daily.    [provider]  valACYclovir (VALTREX) 1000 MG tablet Take 1 tablet (1,000 mg total) by mouth 3 (three) times daily. 02/18/19   Margarita Mail, PA-C    Family History Family History  Problem Relation Age of Onset  . Diabetes Mother   . Hypertension Mother   . Heart disease Mother        based at age 26 of MI  . Diabetes Father   . Hypertension Father   . Heart disease Father   . Cancer Maternal Aunt        liver    Social History Social History   Tobacco Use  . Smoking status: Never Smoker  . Smokeless tobacco: Never Used  Substance Use Topics  . Alcohol use: No  . Drug use: No     Allergies   Atorvastatin   Review of Systems Review of Systems  Reason unable to perform ROS: See HPI as above.     Physical Exam Triage Vital Signs ED Triage Vitals [09/29/19 0819]  Enc Vitals Group     BP 129/80     Pulse Rate 71     Resp 18     Temp 97.8 F (36.6 C)     Temp Source Oral     SpO2 98 %     Weight      Height      Head Circumference      Peak Flow      Pain Score 6     Pain Loc      Pain Edu?      Excl. in [place]?    No data found.  Updated Vital Signs BP 129/80 (BP Location: Left Arm)   Pulse 71   Temp 97.8 F (36.6 C) (Oral)   Resp 18   SpO2 98%   Physical Exam Constitutional:      General: She is not in acute distress.    Appearance: Normal appearance. She is not ill-appearing, toxic-appearing or diaphoretic.  HENT:     Head: Normocephalic and atraumatic.     Right Ear: Ear canal and external ear  normal.     Left Ear: Ear canal and external ear normal.     Ears:     Comments: Bilateral TM opaque    Nose:     Right Sinus: Maxillary sinus tenderness and frontal sinus tenderness present.  Left Sinus: Maxillary sinus tenderness and frontal sinus tenderness present.     Mouth/Throat:     Mouth: Mucous membranes are moist.     Pharynx: Oropharynx is clear. Uvula midline.  Cardiovascular:     Rate and Rhythm: Normal rate and regular rhythm.     Heart sounds: Normal heart sounds. No murmur. No friction rub. No gallop.   Pulmonary:     Effort: Pulmonary effort is normal. No accessory muscle usage, prolonged expiration, respiratory distress or retractions.     Comments: Lungs clear to auscultation without adventitious lung sounds. Chest:     Comments: Chest wall tenderness to palpation.  Musculoskeletal:     Cervical back: Normal range of motion and neck supple.  Neurological:     General: No focal deficit present.     Mental Status: She is alert and oriented to person, place, and time.      UC Treatments / Results  Labs (all labs ordered are listed, but only abnormal results are displayed) Labs Reviewed  NOVEL CORONAVIRUS, NAA    EKG   Radiology No results found.  Procedures Procedures (including critical care time)  Medications Ordered in UC Medications - No data to display  Initial Impression / Assessment and Plan / UC Course  I have reviewed the triage vital signs and the nursing notes.  Pertinent labs & imaging results that were available during my care of the patient were reviewed by me and considered in my medical decision making (see chart for details).    COVID PCR test ordered. Patient to quarantine until testing results return. No alarming signs on exam.  Chest pain reproducible by palpation. Patient speaking in full sentences without respiratory distress.  Symptomatic treatment discussed.  Push fluids.  Return precautions given.  Patient expresses  understanding and agrees to plan.  Final Clinical Impressions(s) / UC Diagnoses   Final diagnoses:  Cough  Nasal congestion    ED Prescriptions    Medication Sig Dispense Auth. Provider   fluticasone (FLONASE) 50 MCG/ACT nasal spray Place 2 sprays into both nostrils daily. 1 g Cortavious Nix V, PA-C   azelastine (ASTELIN) 0.1 % nasal spray Place 2 sprays into both nostrils 2 (two) times daily. 30 mL Rey Fors V, PA-C   benzonatate (TESSALON) 200 MG capsule Take 1 capsule (200 mg total) by mouth every 8 (eight) hours. 21 capsule Ok Edwards, PA-C     PDMP not reviewed this encounter.   Ok Edwards, PA-C 09/29/19 [phone]

## 2019-09-30 LAB — NOVEL CORONAVIRUS, NAA: SARS-CoV-2, NAA: NOT DETECTED

## 2019-09-30 LAB — SARS-COV-2, NAA 2 DAY TAT

## 2019-10-02 ENCOUNTER — Ambulatory Visit: Payer: Medicare HMO | Attending: Internal Medicine

## 2019-10-02 DIAGNOSIS — Z23 Encounter for immunization: Secondary | ICD-10-CM

## 2019-10-02 NOTE — Progress Notes (Signed)
   Covid-19 Vaccination Clinic  Name:  Leo Weyandt    MRN: [id number] DOB: Sep 10, 1965  10/02/2019  Ms. Stankowski was observed post Covid-19 immunization for 15 minutes without incident. She was provided with Vaccine Information Sheet and instruction to access the V-Safe system.   Ms. Seaberg was instructed to call 911 with any severe reactions post vaccine: Marland Kitchen Difficulty breathing  . Swelling of face and throat  . A fast heartbeat  . A bad rash all over body  . Dizziness and weakness   Immunizations Administered    Name Date Dose VIS Date Route   Pfizer COVID-19 Vaccine 10/02/2019 12:08 PM 0.3 mL 07/26/2018 Intramuscular   Manufacturer: [employer], [employer]   Lot: [id number]   NDC: [address]-1

## 2019-10-05 ENCOUNTER — Ambulatory Visit: Payer: Medicare HMO

## 2019-10-26 ENCOUNTER — Other Ambulatory Visit: Payer: Self-pay

## 2019-10-26 ENCOUNTER — Ambulatory Visit
Admission: EM | Admit: 2019-10-26 | Discharge: 2019-10-26 | Disposition: A | Discharge status: Home / Self Care | Payer: Medicare HMO | Attending: Emergency Medicine | Admitting: Emergency Medicine

## 2019-10-26 DIAGNOSIS — J069 Acute upper respiratory infection, unspecified: Secondary | ICD-10-CM

## 2019-10-26 MED ORDER — PREDNISONE 20 MG PO TABS: 20.0000 mg | ORAL_TABLET | Freq: Every day | ORAL | 0 refills | Status: DC

## 2019-10-26 MED ORDER — CETIRIZINE HCL 10 MG PO TABS: 10.0000 mg | ORAL_TABLET | Freq: Every day | ORAL | 0 refills | Status: DC

## 2019-10-26 NOTE — Discharge Instructions (Signed)
Your COVID test is pending - it is important to quarantine / isolate at home until your results are back. °If you test positive and would like further evaluation for persistent or worsening symptoms, you may schedule an E-visit or virtual (video) visit throughout the [facility] MyChart app or website. ° °PLEASE NOTE: If you develop severe chest pain or shortness of breath please go to the ER or call 9-1-1 for further evaluation --> DO NOT schedule electronic or virtual visits for this. °Please call our office for further guidance / recommendations as needed. ° °For information about the Covid vaccine, please visit [email or web address] °

## 2019-10-26 NOTE — ED Triage Notes (Signed)
Pt c/o headache, bilateral ear pain, sore throat and sharp left sided chest pains x 2 days.

## 2019-10-26 NOTE — ED Provider Notes (Signed)
[facility]    CSN: [id number] Arrival date & time: 10/26/19  1633      History   Chief Complaint Chief Complaint  Patient presents with  . Headache  . Otalgia    HPI Jill Anderson is a 54 y.o. female with history of diabetes, chronic back pain presenting for headache, bilateral earache, sore throat, sharp left-sided chest pain x2 days.  Patient received second dose of Pfizer vaccine on the third of this month.  Denies known sick contacts.  Denies cough, difficulty breathing.  States left-sided chest pain is sharp, very brief, without other symptoms.  Has not taken thing for this.  Patient currently taking Flonase twice daily without relief of sinus congestion and pressure.  Not currently taking allergy medication.  Arthralgias, myalgias, vomiting, abdominal pain.   Past Medical History:  Diagnosis Date  . Borderline diabetic   . Chronic back pain   . Diabetes mellitus without complication ([place])   . Hypercholesteremia     Patient Active Problem List   Diagnosis Date Noted  . Myalgia due to statin 12/12/2018  . Vitamin D deficiency 09/21/2017  . Insomnia 05/14/2017  . Menopausal symptoms 01/04/2017  . Urticaria 12/14/2016  . Pain in both hands 12/14/2016  . Hypertension 10/27/2016  . Bilateral anterior knee pain 03/20/2016  . Left ureteral stone 10/27/2015  . New onset of headaches after age 47 10/07/2015  . Hyperlipidemia associated with type 2 diabetes mellitus ([place]) 07/15/2015  . DM2 (diabetes mellitus, type 2) ([place]) 03/04/2015  . Chronic neck pain 09/11/2014  . Cervical disc disorder with radiculopathy of cervical region 09/11/2014  . Chronic radicular low back pain 09/11/2014    Past Surgical History:  Procedure Laterality Date  . ABDOMINAL HYSTERECTOMY    . BACK SURGERY    . CYSTOSCOPY WITH RETROGRADE PYELOGRAM, URETEROSCOPY AND STENT PLACEMENT Left 10/28/2015   Procedure: CYSTOSCOPY WITH RETROGRADE PYELOGRAM, URETEROSCOPY AND STENT PLACEMENT, AND  LASER;  Surgeon: Irine Seal, MD;  Location: [facility];  Service: Urology;  Laterality: Left;    OB History   No obstetric history on file.      Home Medications    Prior to Admission medications   Medication Sig Start Date End Date Taking? Authorizing Provider  ACCU-CHEK FASTCLIX LANCETS MISC USE AS DIRECTED 06/13/18   Charlott Rakes, MD  acetaminophen-codeine (TYLENOL #3) 300-30 MG tablet TAKE 1 TABLET BY MOUTH EVERY 12 HOURS AS NEEDED FOR MODERATE PAIN 09/13/19   Charlott Rakes, MD  aspirin EC 81 MG tablet Take 1 tablet (81 mg total) by mouth daily. 09/29/16   Funches, Adriana Mccallum, MD  azelastine (ASTELIN) 0.1 % nasal spray Place 2 sprays into both nostrils 2 (two) times daily. 09/29/19   Tasia Catchings, Amy V, PA-C  benzonatate (TESSALON) 200 MG capsule Take 1 capsule (200 mg total) by mouth every 8 (eight) hours. 09/29/19   Tasia Catchings, Amy V, PA-C  Blood Glucose Monitoring Suppl (ACCU-CHEK GUIDE ME) w/Device KIT 1 each by Does not apply route daily. 06/13/18   Charlott Rakes, MD  cetirizine (ZYRTEC ALLERGY) 10 MG tablet Take 1 tablet (10 mg total) by mouth daily. 10/26/19   Hall-Potvin, [place], PA-C  Elastic Bandages & Supports ([place] M 15-20MMHG) MISC 1 each by Does not apply route daily. 03/20/16   Funches, Adriana Mccallum, MD  erythromycin ophthalmic ointment Place 1 application into the left eye every 6 (six) hours. Place 1/2 inch ribbon of ointment in the affected eye 4 times a day for 5 days 02/18/19  Harris, Abigail, PA-C  famotidine (PEPCID) 20 MG tablet Take 1 tablet (20 mg total) by mouth 2 (two) times daily. 11/21/18   Hedges, Dellis Filbert, PA-C  fluticasone (FLONASE) 50 MCG/ACT nasal spray Place 2 sprays into both nostrils daily. 09/29/19   Tasia Catchings, Amy V, PA-C  gabapentin (NEURONTIN) 300 MG capsule Take 1 capsule (300 mg total) by mouth at bedtime. 06/21/19   Charlott Rakes, MD  glucose blood (ACCU-CHEK GUIDE) test strip Use as instructed 06/13/18   Charlott Rakes, MD  ibuprofen (ADVIL) 800 MG tablet Take 1  tablet (800 mg total) by mouth every 12 (twelve) hours as needed. 06/21/19   Charlott Rakes, MD  lidocaine (LIDODERM) 5 % Place 1 patch onto the skin daily. Remove & Discard patch within 12 hours or as directed by MD. Dx: cervical and Lumbar radiculopathy 09/19/18   Charlott Rakes, MD  Misc. Devices (CANE) MISC 1 each by Does not apply route as needed. 03/04/15   Funches, Adriana Mccallum, MD  ondansetron (ZOFRAN) 4 MG tablet Take 1 tablet (4 mg total) by mouth every 8 (eight) hours as needed for nausea or vomiting. 02/18/19   Margarita Mail, PA-C  predniSONE (DELTASONE) 20 MG tablet Take 1 tablet (20 mg total) by mouth daily. 10/26/19   Hall-Potvin, [place], PA-C  psyllium (METAMUCIL) 58.6 % powder Take 1 packet by mouth 2 (two) times daily.    [provider]  valACYclovir (VALTREX) 1000 MG tablet Take 1 tablet (1,000 mg total) by mouth 3 (three) times daily. 02/18/19   Margarita Mail, PA-C    Family History Family History  Problem Relation Age of Onset  . Diabetes Mother   . Hypertension Mother   . Heart disease Mother        based at age 14 of MI  . Diabetes Father   . Hypertension Father   . Heart disease Father   . Cancer Maternal Aunt        liver    Social History Social History   Tobacco Use  . Smoking status: Never Smoker  . Smokeless tobacco: Never Used  Substance Use Topics  . Alcohol use: No  . Drug use: No     Allergies   Atorvastatin   Review of Systems As per HPI   Physical Exam Triage Vital Signs ED Triage Vitals  Enc Vitals Group     BP 10/26/19 1652 (!) 145/79     Pulse Rate 10/26/19 1652 79     Resp 10/26/19 1652 16     Temp 10/26/19 1652 98 F (36.7 C)     Temp src --      SpO2 10/26/19 1652 98 %     Weight --      Height --      Head Circumference --      Peak Flow --      Pain Score 10/26/19 1653 7     Pain Loc --      Pain Edu? --      Excl. in [place]? --    No data found.  Updated Vital Signs BP (!) 145/79   Pulse 79   Temp 98 F  (36.7 C)   Resp 16   SpO2 98%   Visual Acuity Right Eye Distance:   Left Eye Distance:   Bilateral Distance:    Right Eye Near:   Left Eye Near:    Bilateral Near:     Physical Exam Constitutional:      General: She is not in  acute distress.    Appearance: She is obese. She is not ill-appearing or diaphoretic.  HENT:     Head: Normocephalic and atraumatic.     Right Ear: Tympanic membrane, ear canal and external ear normal.     Left Ear: Tympanic membrane, ear canal and external ear normal.     Nose: Nose normal.     Mouth/Throat:     Mouth: Mucous membranes are moist.     Pharynx: Oropharynx is clear. No oropharyngeal exudate or posterior oropharyngeal erythema.  Eyes:     General: No scleral icterus.    Conjunctiva/sclera: Conjunctivae normal.     Pupils: Pupils are equal, round, and reactive to light.  Neck:     Comments: Trachea midline, negative JVD Cardiovascular:     Rate and Rhythm: Normal rate and regular rhythm.     Heart sounds: No murmur. No gallop.   Pulmonary:     Effort: Pulmonary effort is normal. No respiratory distress.     Breath sounds: No wheezing, rhonchi or rales.  Musculoskeletal:     Cervical back: Neck supple. No tenderness.  Lymphadenopathy:     Cervical: No cervical adenopathy.  Skin:    Capillary Refill: Capillary refill takes less than 2 seconds.     Coloration: Skin is not jaundiced or pale.     Findings: No rash.  Neurological:     General: No focal deficit present.     Mental Status: She is alert and oriented to person, place, and time.      UC Treatments / Results  Labs (all labs ordered are listed, but only abnormal results are displayed) Labs Reviewed  NOVEL CORONAVIRUS, NAA    EKG   Radiology No results found.  Procedures Procedures (including critical care time)  Medications Ordered in UC Medications - No data to display  Initial Impression / Assessment and Plan / UC Course  I have reviewed the triage vital  signs and the nursing notes.  Pertinent labs & imaging results that were available during my care of the patient were reviewed by me and considered in my medical decision making (see chart for details).     Patient afebrile, nontoxic, with SpO2 98%.  Covid PCR pending.  Patient to quarantine until results are back.  We will treat supportively as outlined below. EKG done office, reviewed by me (due to technical error, unable to review previous):  NSR with ventricular 70 bpm.  No QTC prolongation, ST elevation or inversion.  Nonacute EKG.  Reviewed findings with patient verbalized understanding. Return precautions discussed, patient verbalized understanding and is agreeable to plan. Final Clinical Impressions(s) / UC Diagnoses   Final diagnoses:  Upper respiratory tract infection, unspecified type     Discharge Instructions     Your COVID test is pending - it is important to quarantine / isolate at home until your results are back. If you test positive and would like further evaluation for persistent or worsening symptoms, you may schedule an E-visit or virtual (video) visit throughout the [facility] app or website.  PLEASE NOTE: If you develop severe chest pain or shortness of breath please go to the ER or call [phone] for further evaluation --> DO NOT schedule electronic or virtual visits for this. Please call our office for further guidance / recommendations as needed.  For information about the Covid vaccine, please visit [email or web address]    ED Prescriptions    Medication Sig Dispense Auth. Provider   predniSONE (DELTASONE) 20 MG tablet  Take 1 tablet (20 mg total) by mouth daily. 5 tablet Hall-Potvin, [place], PA-C   cetirizine (ZYRTEC ALLERGY) 10 MG tablet Take 1 tablet (10 mg total) by mouth daily. 30 tablet Hall-Potvin, [place], PA-C     PDMP not reviewed this encounter.   Hall-Potvin, [place], [place] 10/26/19 1739

## 2019-10-28 LAB — SARS-COV-2, NAA 2 DAY TAT

## 2019-10-28 LAB — NOVEL CORONAVIRUS, NAA: SARS-CoV-2, NAA: NOT DETECTED

## 2019-11-17 ENCOUNTER — Other Ambulatory Visit: Payer: Self-pay | Admitting: Family Medicine

## 2019-11-17 DIAGNOSIS — M5010 Cervical disc disorder with radiculopathy, unspecified cervical region: Secondary | ICD-10-CM

## 2019-11-17 DIAGNOSIS — M5416 Radiculopathy, lumbar region: Secondary | ICD-10-CM

## 2019-11-23 ENCOUNTER — Ambulatory Visit
Admission: RE | Admit: 2019-11-23 | Discharge: 2019-11-23 | Disposition: A | Discharge status: Home / Self Care | Payer: Medicare HMO | Source: Ambulatory Visit | Attending: Family Medicine | Admitting: Family Medicine

## 2019-11-23 ENCOUNTER — Other Ambulatory Visit: Payer: Self-pay

## 2019-11-23 DIAGNOSIS — Z1231 Encounter for screening mammogram for malignant neoplasm of breast: Secondary | ICD-10-CM | POA: Diagnosis not present

## 2019-12-19 ENCOUNTER — Ambulatory Visit: Payer: Medicare HMO | Attending: Family Medicine | Admitting: Family Medicine

## 2019-12-19 ENCOUNTER — Encounter: Payer: Self-pay | Admitting: Family Medicine

## 2019-12-19 VITALS — BP 136/76 | HR 64 | Ht 62.0 in | Wt 175.0 lb

## 2019-12-19 DIAGNOSIS — M5416 Radiculopathy, lumbar region: Secondary | ICD-10-CM

## 2019-12-19 DIAGNOSIS — E1169 Type 2 diabetes mellitus with other specified complication: Secondary | ICD-10-CM | POA: Diagnosis not present

## 2019-12-19 DIAGNOSIS — G8929 Other chronic pain: Secondary | ICD-10-CM

## 2019-12-19 DIAGNOSIS — E785 Hyperlipidemia, unspecified: Secondary | ICD-10-CM | POA: Diagnosis not present

## 2019-12-19 DIAGNOSIS — M5010 Cervical disc disorder with radiculopathy, unspecified cervical region: Secondary | ICD-10-CM | POA: Diagnosis not present

## 2019-12-19 LAB — POCT GLYCOSYLATED HEMOGLOBIN (HGB A1C): HbA1c, POC (controlled diabetic range): 6.4 % (ref 0.0–7.0)

## 2019-12-19 LAB — GLUCOSE, POCT (MANUAL RESULT ENTRY): POC Glucose: 131 mg/dl — AB (ref 70–99)

## 2019-12-19 MED ORDER — ACCU-CHEK GUIDE VI STRP: ORAL_STRIP | 12 refills | Status: DC

## 2019-12-19 MED ORDER — GABAPENTIN 300 MG PO CAPS: 300.0000 mg | ORAL_CAPSULE | Freq: Every day | ORAL | 6 refills | Status: DC

## 2019-12-19 MED ORDER — ACETAMINOPHEN-CODEINE #3 300-30 MG PO TABS: ORAL_TABLET | ORAL | 2 refills | Status: DC

## 2019-12-19 MED ORDER — ACCU-CHEK FASTCLIX LANCETS MISC: 12 refills | Status: DC

## 2019-12-19 NOTE — Patient Instructions (Signed)
Diclofenac skin gel What is this medicine? DICLOFENAC (dye KLOE fen ak) is a non-steroidal anti-inflammatory drug (NSAID). The 1% skin gel is used to treat osteoarthritis of the hands or knees. The 3% skin gel is used to treat actinic keratosis. This medicine may be used for other purposes; ask your health care provider or pharmacist if you have questions. COMMON BRAND NAME(S): DSG Pak, Omeca, Solaravix, Solaraze, Voltaren Arthritis, Voltaren Gel What should I tell my health care provider before I take this medicine? They need to know if you have any of these conditions:  asthma  bleeding problems  coronary artery bypass graft (CABG) surgery within the past 2 weeks  heart disease  high blood pressure  if you frequently drink alcohol containing drinks  kidney disease  liver disease  open or infected skin  stomach problems  an unusual or allergic reaction to diclofenac, aspirin, other NSAIDs, other medicines, benzyl alcohol (3% gel only), foods, dyes, or preservatives  pregnant or trying to get pregnant  breast-feeding How should I use this medicine? This medicine is for external use only. Follow the directions on the prescription label. Wash hands before and after use. Do not get this medicine in your eyes. If you do, rinse out with plenty of cool tap water. Use your doses at regular intervals. Do not use your medicine more often than directed. A special MedGuide will be given to you by the pharmacist with each prescription and refill of the 1% gel. Be sure to read this information carefully each time. Talk to your pediatrician regarding the use of this medicine in children. Special care may be needed. The 3% gel is not approved for use in children. Overdosage: If you think you have taken too much of this medicine contact a poison control center or emergency room at once. NOTE: This medicine is only for you. Do not share this medicine with others. What if I miss a dose? If you  miss a dose, use it as soon as you can. If it is almost time for your next dose, use only that dose. Do not use double or extra doses. What may interact with this medicine?  aspirin  NSAIDs, medicines for pain and inflammation, like ibuprofen or naproxen Do not use any other skin products without telling your doctor or health care professional. This list may not describe all possible interactions. Give your health care provider a list of all the medicines, herbs, non-prescription drugs, or dietary supplements you use. Also tell them if you smoke, drink alcohol, or use illegal drugs. Some items may interact with your medicine. What should I watch for while using this medicine? Tell your doctor or healthcare provider if your symptoms do not start to get better or if they get worse. You will need to follow up with your healthcare provider to monitor your progress. You may need to be treated for up to 3 months if you are using the 3% gel, but the full effect may not occur until 1 month after stopping treatment. If you develop a severe skin reaction, contact your doctor or healthcare provider immediately. This medicine may cause serious skin reactions. They can happen weeks to months after starting the medicine. Contact your healthcare provider right away if you notice fevers or flu-like symptoms with a rash. The rash may be red or purple and then turn into blisters or peeling of the skin. Or, you might notice a red rash with swelling of the face, lips or lymph nodes in   your neck or under your arms. This medicine can make you more sensitive to the sun. Keep out of the sun. If you cannot avoid being in the sun, wear protective clothing and use sunscreen. Do not use sun lamps or tanning beds/booths. Do not take medicines such as ibuprofen and naproxen with this medicine. Side effects such as stomach upset, nausea, or ulcers may be more likely to occur. Many medicines available without a prescription should not  be taken with this medicine. This medicine does not prevent heart attack or stroke. In fact, this medicine may increase the chance of a heart attack or stroke. The chance may increase with longer use of this medicine and in people who have heart disease. If you take aspirin to prevent heart attack or stroke, talk with your doctor or healthcare provider. This medicine can cause ulcers and bleeding in the stomach and intestines at any time during treatment. Do not smoke cigarettes or drink alcohol. These increase irritation to your stomach and can make it more susceptible to damage from this medicine. Ulcers and bleeding can happen without warning symptoms and can cause death. You may get drowsy or dizzy. Do not drive, use machinery, or do anything that needs mental alertness until you know how this medicine affects you. Do not stand or sit up quickly, especially if you are an older patient. This reduces the risk of dizzy or fainting spells. This medicine can cause you to bleed more easily. Try to avoid damage to your teeth and gums when you brush or floss your teeth. What side effects may I notice from receiving this medicine? Side effects that you should report to your doctor or health care professional as soon as possible:  allergic reactions like skin rash, itching or hives, swelling of the face, lips, or tongue  black or bloody stools, blood in the urine or vomit  blurred vision  chest pain  difficulty breathing or wheezing  nausea or vomiting  rash, fever, and swollen lymph nodes  redness, blistering, peeling or loosening of the skin, including inside the mouth  slurred speech or weakness on one side of the body  trouble passing urine or change in the amount of urine  unexplained weight gain or swelling  unusually weak or tired  yellowing of eyes or skin Side effects that usually do not require medical attention (report to your doctor or health care professional if they continue  or are bothersome):  dizziness  dry skin  headache  heartburn  increased sensitivity to the sun  stomach pain  tingling at the application site This list may not describe all possible side effects. Call your doctor for medical advice about side effects. You may report side effects to FDA at 1-800-FDA-1088. Where should I keep my medicine? Keep out of the reach of children. Store the 1% gel at room temperature between 15 and 30 degrees C (59 and 86 degrees F). Store the 3% gel at room temperature between 20 and 25 degrees C (68 and 77 degrees F). Protect from light. Throw away any unused medicine after the expiration date. NOTE: This sheet is a summary. It may not cover all possible information. If you have questions about this medicine, talk to your doctor, pharmacist, or health care provider.  2020 Elsevier/Gold Standard (2018-08-03 13:05:18)  

## 2019-12-19 NOTE — Progress Notes (Signed)
Subjective:  Patient ID: Jill Anderson, female    DOB: April 29, 1966  Age: 54 y.o. MRN: [id number]  CC: Diabetes   HPI Jill Anderson is a [place] female with a history of Type 2 Diabetes Mellitus (A1c 6.4), Hypercholesterolemia, chronic neck and chronic low back pain secondary to degenerative changes of the spine(declines surgical management), Insomnia who presents today for follow-up visit. She has a lot of pain alternating between her L shoulder, neck and hands hurt when its rainy.  Not really doing Gabapentin but doing Tylenol #3 1-2x/day alternating with Ibuprofen.  Also perform some stretches at home and performs the physical therapy exercises she learned when she underwent a course of physical therapy.  She continually poses the option of surgical management of her pain as she states it is not a permanent fix.  She is unable to take statin or zetia due to myalgias but uses natural supplements for her cholesterol.  Her diabetes is diet controlled.  She drinks Kava Tea for insomnia. Past Medical History:  Diagnosis Date  . Borderline diabetic   . Chronic back pain   . Diabetes mellitus without complication ([place])   . Hypercholesteremia     Past Surgical History:  Procedure Laterality Date  . ABDOMINAL HYSTERECTOMY    . BACK SURGERY    . CYSTOSCOPY WITH RETROGRADE PYELOGRAM, URETEROSCOPY AND STENT PLACEMENT Left 10/28/2015   Procedure: CYSTOSCOPY WITH RETROGRADE PYELOGRAM, URETEROSCOPY AND STENT PLACEMENT, AND LASER;  Surgeon: Irine Seal, MD;  Location: [facility];  Service: Urology;  Laterality: Left;    Family History  Problem Relation Age of Onset  . Diabetes Mother   . Hypertension Mother   . Heart disease Mother        based at age 20 of MI  . Diabetes Father   . Hypertension Father   . Heart disease Father   . Cancer Maternal Aunt        liver    Allergies  Allergen Reactions  . Atorvastatin Other (See Comments)    myalgia    Outpatient Medications Prior to Visit   Medication Sig Dispense Refill  . aspirin EC 81 MG tablet Take 1 tablet (81 mg total) by mouth daily. 30 tablet 11  . azelastine (ASTELIN) 0.1 % nasal spray Place 2 sprays into both nostrils 2 (two) times daily. 30 mL 0  . Blood Glucose Monitoring Suppl (ACCU-CHEK GUIDE ME) w/Device KIT 1 each by Does not apply route daily. 1 kit 0  . cetirizine (ZYRTEC ALLERGY) 10 MG tablet Take 1 tablet (10 mg total) by mouth daily. 30 tablet 0  . Elastic Bandages & Supports (TRUFORM ARM 80 M 15-20MMHG) MISC 1 each by Does not apply route daily. 1 each 0  . famotidine (PEPCID) 20 MG tablet Take 1 tablet (20 mg total) by mouth 2 (two) times daily. 30 tablet 0  . fluticasone (FLONASE) 50 MCG/ACT nasal spray Place 2 sprays into both nostrils daily. 1 g 0  . ibuprofen (ADVIL) 800 MG tablet Take 1 tablet (800 mg total) by mouth every 12 (twelve) hours as needed. 60 tablet 2  . Misc. Devices (CANE) MISC 1 each by Does not apply route as needed. 1 each 0  . ondansetron (ZOFRAN) 4 MG tablet Take 1 tablet (4 mg total) by mouth every 8 (eight) hours as needed for nausea or vomiting. 10 tablet 0  . psyllium (METAMUCIL) 58.6 % powder Take 1 packet by mouth 2 (two) times daily.    Marland Kitchen ACCU-CHEK FASTCLIX LANCETS  MISC USE AS DIRECTED 102 each 12  . ACCU-CHEK GUIDE test strip USE AS DIRECTED 100 each 0  . acetaminophen-codeine (TYLENOL #3) 300-30 MG tablet TAKE 1 TABLET BY MOUTH EVERY 12 HOURS AS NEEDED FOR MODERATE PAIN 60 tablet 0  . gabapentin (NEURONTIN) 300 MG capsule Take 1 capsule (300 mg total) by mouth at bedtime. 30 capsule 6  . valACYclovir (VALTREX) 1000 MG tablet Take 1 tablet (1,000 mg total) by mouth 3 (three) times daily. 21 tablet 0  . erythromycin ophthalmic ointment Place 1 application into the left eye every 6 (six) hours. Place 1/2 inch ribbon of ointment in the affected eye 4 times a day for 5 days (Patient not taking: Reported on 12/19/2019) 3.5 g 0  . lidocaine (LIDODERM) 5 % Place 1 patch onto the  skin daily. Remove & Discard patch within 12 hours or as directed by MD. Dx: cervical and Lumbar radiculopathy 30 patch 1  . benzonatate (TESSALON) 200 MG capsule Take 1 capsule (200 mg total) by mouth every 8 (eight) hours. (Patient not taking: Reported on 12/19/2019) 21 capsule 0  . predniSONE (DELTASONE) 20 MG tablet Take 1 tablet (20 mg total) by mouth daily. (Patient not taking: Reported on 12/19/2019) 5 tablet 0   No facility-administered medications prior to visit.     ROS Review of Systems  Constitutional: Negative for activity change, appetite change and fatigue.  HENT: Negative for congestion, sinus pressure and sore throat.   Eyes: Negative for visual disturbance.  Respiratory: Negative for cough, chest tightness, shortness of breath and wheezing.   Cardiovascular: Negative for chest pain and palpitations.  Gastrointestinal: Negative for abdominal distention, abdominal pain and constipation.  Endocrine: Negative for polydipsia.  Genitourinary: Negative for dysuria and frequency.  Musculoskeletal:       See HPI  Skin: Negative for rash.  Neurological: Negative for tremors, light-headedness and numbness.  Hematological: Does not bruise/bleed easily.  Psychiatric/Behavioral: Negative for agitation and behavioral problems.    Objective:  BP 136/76   Pulse 64   Ht 5' 2"  (1.575 m)   Wt 175 lb (79.4 kg)   SpO2 98%   BMI 32.01 kg/m   BP/Weight 12/19/2019 10/26/2019 0/16/5537  Systolic BP 482 707 867  Diastolic BP 76 79 80  Wt. (Lbs) 175 - -  BMI 32.01 - -      Physical Exam Constitutional:      Appearance: She is well-developed.  Neck:     Vascular: No JVD.  Cardiovascular:     Rate and Rhythm: Normal rate.     Heart sounds: Normal heart sounds. No murmur heard.   Pulmonary:     Effort: Pulmonary effort is normal.     Breath sounds: Normal breath sounds. No wheezing or rales.  Chest:     Chest wall: No tenderness.  Abdominal:     General: Bowel sounds are  normal. There is no distension.     Palpations: Abdomen is soft. There is no mass.     Tenderness: There is no abdominal tenderness.  Musculoskeletal:        General: Tenderness (on ROM of entire spine) present.     Right lower leg: No edema.     Left lower leg: No edema.  Neurological:     Mental Status: She is alert and oriented to person, place, and time.     Comments: Normal handgrip bilaterally  Psychiatric:        Mood and Affect: Mood normal.  CMP Latest Ref Rng & Units 06/21/2019 05/01/2019 02/18/2019  Glucose 65 - 99 mg/dL 85 146(H) 120(H)  BUN 6 - 24 mg/dL 10 13 15   Creatinine 0.57 - 1.00 mg/dL 0.69 0.65 0.97  Sodium 134 - 144 mmol/L 140 140 138  Potassium 3.5 - 5.2 mmol/L 4.2 4.6 3.7  Chloride 96 - 106 mmol/L 99 103 104  CO2 20 - 29 mmol/L 26 25 27   Calcium 8.7 - 10.2 mg/dL 9.9 9.6 9.0  Total Protein 6.0 - 8.5 g/dL 8.3 7.6 7.2  Total Bilirubin 0.0 - 1.2 mg/dL 0.8 0.7 1.5(H)  Alkaline Phos 39 - 117 IU/L 62 79 50  AST 0 - 40 IU/L 22 18 26   ALT 0 - 32 IU/L 22 24 30     Lipid Panel     Component Value Date/Time   CHOL 234 (H) 06/21/2019 1518   TRIG 90 06/21/2019 1518   HDL 60 06/21/2019 1518   CHOLHDL 3.9 06/21/2019 1518   CHOLHDL 4.8 07/12/2015 1008   VLDL 14 07/12/2015 1008   LDLCALC 158 (H) 06/21/2019 1518    CBC    Component Value Date/Time   WBC 7.8 02/18/2019 1433   RBC 4.70 02/18/2019 1433   HGB 13.9 02/18/2019 1433   HGB 14.9 02/15/2019 1714   HCT 43.4 02/18/2019 1433   HCT 46.3 02/15/2019 1714   PLT 308 02/18/2019 1433   PLT 332 02/15/2019 1714   MCV 92.3 02/18/2019 1433   MCV 94 02/15/2019 1714   MCH 29.6 02/18/2019 1433   MCHC 32.0 02/18/2019 1433   RDW 11.8 02/18/2019 1433   RDW 12.1 02/15/2019 1714   LYMPHSABS 3.1 02/18/2019 1433   LYMPHSABS 3.0 08/12/2017 1050   MONOABS 0.6 02/18/2019 1433   EOSABS 0.4 02/18/2019 1433   EOSABS 0.3 08/12/2017 1050   BASOSABS 0.1 02/18/2019 1433   BASOSABS 0.0 08/12/2017 1050    Lab Results   Component Value Date   HGBA1C 6.4 12/19/2019    Assessment & Plan:  1. Type 2 diabetes mellitus with other specified complication, without long-term current use of insulin (HCC) Controlled with A1c of 6.4; goal is less than 7.0 Diet controlled Counseled on Diabetic diet, my plate method, 465 minutes of moderate intensity exercise/week Blood sugar logs with fasting goals of 80-120 mg/dl, random of less than 180 and in the event of sugars less than 60 mg/dl or greater than 400 mg/dl encouraged to notify the clinic. Advised on the need for annual eye exams, annual foot exams, Pneumonia vaccine. - POCT glucose (manual entry) - POCT glycosylated hemoglobin (Hb A1C) - Accu-Chek FastClix Lancets MISC; USE AS DIRECTED  Dispense: 100 each; Refill: 12 - glucose blood (ACCU-CHEK GUIDE) test strip; USE AS DIRECTED  Dispense: 100 each; Refill: 12 - CMP14+EGFR - LP+Non-HDL Cholesterol  2. Cervical disc disorder with radiculopathy of cervical region Stable PDMP reviewed-no evidence of aberrant behavior Continue Tylenol 3 Discussed aquatic therapy, yoga, stretches, massage as adjunct modalities for chronic pain management - Drug Screen 12+Alcohol+CRT, Ur - gabapentin (NEURONTIN) 300 MG capsule; Take 1 capsule (300 mg total) by mouth at bedtime.  Dispense: 30 capsule; Refill: 6 - acetaminophen-codeine (TYLENOL #3) 300-30 MG tablet; TAKE 1 TABLET BY MOUTH EVERY 12 HOURS AS NEEDED FOR MODERATE PAIN  Dispense: 60 tablet; Refill: 2  3. Hyperlipidemia associated with type 2 diabetes mellitus (HCC) Statin and Zetia intolerance Low-cholesterol diet  4. Chronic radicular low back pain See #2 above - acetaminophen-codeine (TYLENOL #3) 300-30 MG tablet; TAKE 1 TABLET BY  MOUTH EVERY 12 HOURS AS NEEDED FOR MODERATE PAIN  Dispense: 60 tablet; Refill: 2   Meds ordered this encounter  Medications  . Accu-Chek FastClix Lancets MISC    Sig: USE AS DIRECTED    Dispense:  100 each    Refill:  12  . glucose  blood (ACCU-CHEK GUIDE) test strip    Sig: USE AS DIRECTED    Dispense:  100 each    Refill:  12  . gabapentin (NEURONTIN) 300 MG capsule    Sig: Take 1 capsule (300 mg total) by mouth at bedtime.    Dispense:  30 capsule    Refill:  6  . acetaminophen-codeine (TYLENOL #3) 300-30 MG tablet    Sig: TAKE 1 TABLET BY MOUTH EVERY 12 HOURS AS NEEDED FOR MODERATE PAIN    Dispense:  60 tablet    Refill:  2    Follow-up: Return in about 6 months (around 06/20/2020) for Chronic disease management.       Charlott Rakes, MD, FAAFP. [facility] [place], [place]   12/19/2019, 12:53 PM

## 2019-12-19 NOTE — Progress Notes (Signed)
Refill on testing supplies.

## 2019-12-20 LAB — LP+NON-HDL CHOLESTEROL
Cholesterol, Total: 241 mg/dL — ABNORMAL HIGH (ref 100–199)
HDL: 53 mg/dL (ref >39)
LDL Chol Calc (NIH): 151 mg/dL — ABNORMAL HIGH (ref 0–99)
Total Non-HDL-Chol (LDL+VLDL): 188 mg/dL — ABNORMAL HIGH (ref 0–129)
Triglycerides: 202 mg/dL — ABNORMAL HIGH (ref 0–149)
VLDL Cholesterol Cal: 37 mg/dL (ref 5–40)

## 2019-12-20 LAB — CMP14+EGFR
ALT: 23 IU/L (ref 0–32)
AST: 18 IU/L (ref 0–40)
Albumin/Globulin Ratio: 1.5 (ref 1.2–2.2)
Albumin: 4.7 g/dL (ref 3.8–4.9)
Alkaline Phosphatase: 69 IU/L (ref 48–121)
BUN/Creatinine Ratio: 17 (ref 9–23)
BUN: 13 mg/dL (ref 6–24)
Bilirubin Total: 0.5 mg/dL (ref 0.0–1.2)
CO2: 23 mmol/L (ref 20–29)
Calcium: 9.6 mg/dL (ref 8.7–10.2)
Chloride: 100 mmol/L (ref 96–106)
Creatinine, Ser: 0.77 mg/dL (ref 0.57–1.00)
GFR calc Af Amer: 101 mL/min/{1.73_m2} (ref >59)
GFR calc non Af Amer: 87 mL/min/{1.73_m2} (ref >59)
Globulin, Total: 3.1 g/dL (ref 1.5–4.5)
Glucose: 120 mg/dL — ABNORMAL HIGH (ref 65–99)
Potassium: 4.5 mmol/L (ref 3.5–5.2)
Sodium: 139 mmol/L (ref 134–144)
Total Protein: 7.8 g/dL (ref 6.0–8.5)

## 2019-12-21 ENCOUNTER — Telehealth: Payer: Self-pay | Admitting: Family Medicine

## 2019-12-21 DIAGNOSIS — E1169 Type 2 diabetes mellitus with other specified complication: Secondary | ICD-10-CM

## 2019-12-21 NOTE — Telephone Encounter (Signed)
Copied from CRM [id number]. Topic: General - Other >> Dec 21, 2019  3:02 PM Marylen Ponto wrote: Reason for CRM: Tresa Endo with [facility] called to request new prescriptions on both the glucose blood (ACCU-CHEK GUIDE) test strips and Accu-Chek FastClix Lancets MISC due to patient insurance will not approve it with the instructions use as directed. Tresa Endo requests that new prescriptions be sent. Cb# [phone]

## 2019-12-22 MED ORDER — ACCU-CHEK FASTCLIX LANCETS MISC: 11 refills | Status: DC

## 2019-12-22 MED ORDER — ACCU-CHEK GUIDE VI STRP: ORAL_STRIP | 11 refills | Status: DC

## 2019-12-22 NOTE — Telephone Encounter (Signed)
Rx sent 

## 2019-12-25 ENCOUNTER — Telehealth: Payer: Self-pay | Admitting: Family Medicine

## 2019-12-25 DIAGNOSIS — M5416 Radiculopathy, lumbar region: Secondary | ICD-10-CM

## 2019-12-25 NOTE — Telephone Encounter (Signed)
Copied from CRM [id number]. Topic: Referral - Request for Referral >> Dec 25, 2019  9:29 AM Angela Nevin wrote: Has patient seen PCP for this complaint? *If NO, is insurance requiring patient see PCP for this issue before PCP can refer them? Yes  Referral for which specialty: Imaging  Preferred provider/office: [facility] Reason for referral: Lumbar and spine MRI

## 2019-12-26 NOTE — Telephone Encounter (Signed)
Patient is requesting MRI for her disability case. States that she needs a recent MRI.

## 2019-12-26 NOTE — Telephone Encounter (Signed)
MRI of has been ordered.  Please schedule appointment

## 2019-12-27 ENCOUNTER — Other Ambulatory Visit: Payer: Self-pay | Admitting: Family Medicine

## 2019-12-27 ENCOUNTER — Encounter: Payer: Self-pay | Admitting: Family Medicine

## 2019-12-27 DIAGNOSIS — E785 Hyperlipidemia, unspecified: Secondary | ICD-10-CM

## 2019-12-27 LAB — DRUG SCREEN 12+ALCOHOL+CRT, UR
Amphetamines, Urine: NEGATIVE ng/mL
BENZODIAZ UR QL: NEGATIVE ng/mL
Barbiturate: NEGATIVE ng/mL
Cannabinoids: NEGATIVE ng/mL
Cocaine (Metabolite): NEGATIVE ng/mL
Creatinine, Urine: 126.8 mg/dL (ref 20.0–300.0)
Ethanol, Urine: NEGATIVE %
Meperidine: NEGATIVE ng/mL
Methadone: NEGATIVE ng/mL
OPIATE SCREEN URINE: POSITIVE ng/mL — AB
Oxycodone/Oxymorphone, Urine: NEGATIVE ng/mL
Phencyclidine: NEGATIVE ng/mL
Propoxyphene: NEGATIVE ng/mL
Tramadol: NEGATIVE ng/mL

## 2019-12-27 NOTE — Telephone Encounter (Signed)
Patient was called and informed of appointment. 

## 2020-01-06 ENCOUNTER — Other Ambulatory Visit (HOSPITAL_COMMUNITY): Payer: Medicare HMO

## 2020-01-13 ENCOUNTER — Ambulatory Visit (HOSPITAL_COMMUNITY)
Admission: RE | Admit: 2020-01-13 | Discharge: 2020-01-13 | Disposition: A | Discharge status: Home / Self Care | Payer: Medicare HMO | Source: Ambulatory Visit | Attending: Family Medicine | Admitting: Family Medicine

## 2020-01-13 ENCOUNTER — Other Ambulatory Visit: Payer: Self-pay

## 2020-01-13 DIAGNOSIS — G8929 Other chronic pain: Secondary | ICD-10-CM | POA: Insufficient documentation

## 2020-01-13 DIAGNOSIS — M5416 Radiculopathy, lumbar region: Secondary | ICD-10-CM | POA: Insufficient documentation

## 2020-01-13 DIAGNOSIS — M48061 Spinal stenosis, lumbar region without neurogenic claudication: Secondary | ICD-10-CM | POA: Diagnosis not present

## 2020-01-13 DIAGNOSIS — M4727 Other spondylosis with radiculopathy, lumbosacral region: Secondary | ICD-10-CM | POA: Diagnosis not present

## 2020-01-13 DIAGNOSIS — M4699 Unspecified inflammatory spondylopathy, multiple sites in spine: Secondary | ICD-10-CM | POA: Diagnosis not present

## 2020-01-13 DIAGNOSIS — M4726 Other spondylosis with radiculopathy, lumbar region: Secondary | ICD-10-CM | POA: Diagnosis not present

## 2020-01-13 MED ORDER — GADOBUTROL 1 MMOL/ML IV SOLN
8.0000 mL | Freq: Once | INTRAVENOUS | Status: AC | PRN
  Administered 2020-01-13: 8 mL via INTRAVENOUS

## 2020-01-17 ENCOUNTER — Ambulatory Visit
Admission: EM | Admit: 2020-01-17 | Discharge: 2020-01-17 | Disposition: A | Discharge status: Home / Self Care | Payer: Medicare HMO | Attending: Emergency Medicine | Admitting: Emergency Medicine

## 2020-01-17 DIAGNOSIS — Z1152 Encounter for screening for COVID-19: Secondary | ICD-10-CM | POA: Diagnosis not present

## 2020-01-17 NOTE — ED Triage Notes (Signed)
Pt here for covid testing after covid exposure

## 2020-01-18 LAB — NOVEL CORONAVIRUS, NAA: SARS-CoV-2, NAA: NOT DETECTED

## 2020-01-18 LAB — SARS-COV-2, NAA 2 DAY TAT

## 2020-02-01 ENCOUNTER — Other Ambulatory Visit: Payer: Self-pay

## 2020-02-01 ENCOUNTER — Ambulatory Visit
Admission: EM | Admit: 2020-02-01 | Discharge: 2020-02-01 | Disposition: A | Discharge status: Home / Self Care | Payer: Medicare HMO | Attending: Emergency Medicine | Admitting: Emergency Medicine

## 2020-02-01 DIAGNOSIS — Z1152 Encounter for screening for COVID-19: Secondary | ICD-10-CM

## 2020-02-01 NOTE — ED Triage Notes (Signed)
Pt states had a positive covid exposure a wk ago. States had sx's that has now gone. Requesting covid testing.

## 2020-02-01 NOTE — Discharge Instructions (Signed)

## 2020-02-03 LAB — NOVEL CORONAVIRUS, NAA: SARS-CoV-2, NAA: NOT DETECTED

## 2020-02-11 NOTE — Progress Notes (Signed)
Cardiology Office Note:   Date:  02/12/2020  NAME:  Jill Anderson    MRN: [id number] DOB:  Oct 06, 1965   PCP:  Charlott Rakes, MD  Cardiologist:  No primary care provider on file.   Referring MD: Charlott Rakes, MD   Chief Complaint  Patient presents with   Hyperlipidemia   History of Present Illness:   Jill Anderson is a 53 y.o. female with a hx of DM, HLD who is being seen today for the evaluation of HLD at the request of Charlott Rakes, MD. Statin intolerance reported.  She apparently has tried Lipitor and Zetia.  She reports no other statins.  I did review the records from her primary care physician she tried simvastatin.  She does not appear to have tried Crestor.  She also reports that she gets cramping in her legs.  She reports when she exerts herself she can get pain in the calf muscles.  She reports it is a cramping sensation.  She does have diminished pulses on examination.  She does not have a heavy smoking history but needs to have PAD ruled out.  Her medical history is also significant for diabetes.  This appears to be well controlled with an A1c of 6.4.  Most recent LDL cholesterol 151.  Blood pressure is 120/80.  She is a never smoker.  Does not use drugs or drink alcohol.  She is never had a heart attack or stroke.  Based on her prior history of diabetes she does need to be on a statin or have her LDL cholesterol reduce.  We did discuss restarting a different agent.  She is okay to try Crestor.  EKG today demonstrates normal sinus rhythm with nonspecific ST-T changes.  She denies any chest pain or shortness of breath today.  She does exercise and can walk for up to 30 minutes with some limitations due to arthritis.  She denies any chest pain with his current level of activity.  Problem List 1. Diabetes -A1c 6.4 2. HLD -T chol 241, HDL 51, LDL 151, TG 202  Past Medical History: Past Medical History:  Diagnosis Date   Borderline diabetic    Chronic back pain    Diabetes  mellitus without complication ([place])    Hypercholesteremia     Past Surgical History: Past Surgical History:  Procedure Laterality Date   ABDOMINAL HYSTERECTOMY     BACK SURGERY     CYSTOSCOPY WITH RETROGRADE PYELOGRAM, URETEROSCOPY AND STENT PLACEMENT Left 10/28/2015   Procedure: CYSTOSCOPY WITH RETROGRADE PYELOGRAM, URETEROSCOPY AND STENT PLACEMENT, AND LASER;  Surgeon: Irine Seal, MD;  Location: [facility];  Service: Urology;  Laterality: Left;    Current Medications: Current Meds  Medication Sig   Accu-Chek FastClix Lancets MISC USE AS DIRECTED to check blood sugar once daily.   acetaminophen-codeine (TYLENOL #3) 300-30 MG tablet TAKE 1 TABLET BY MOUTH EVERY 12 HOURS AS NEEDED FOR MODERATE PAIN   aspirin EC 81 MG tablet Take 1 tablet (81 mg total) by mouth daily.   azelastine (ASTELIN) 0.1 % nasal spray Place 2 sprays into both nostrils 2 (two) times daily.   Blood Glucose Monitoring Suppl (ACCU-CHEK GUIDE ME) w/Device KIT 1 each by Does not apply route daily.   cetirizine (ZYRTEC ALLERGY) 10 MG tablet Take 1 tablet (10 mg total) by mouth daily.   Elastic Bandages & Supports (TRUFORM ARM 36 M 15-20MMHG) MISC 1 each by Does not apply route daily.   erythromycin ophthalmic ointment Place 1 application into the left  eye every 6 (six) hours. Place 1/2 inch ribbon of ointment in the affected eye 4 times a day for 5 days   famotidine (PEPCID) 20 MG tablet Take 1 tablet (20 mg total) by mouth 2 (two) times daily.   fluticasone (FLONASE) 50 MCG/ACT nasal spray Place 2 sprays into both nostrils daily.   glucose blood (ACCU-CHEK GUIDE) test strip USE AS DIRECTED to check blood sugar once daily.   ibuprofen (ADVIL) 800 MG tablet Take 1 tablet (800 mg total) by mouth every 12 (twelve) hours as needed.   metFORMIN (GLUCOPHAGE) 500 MG tablet Take by mouth.   Misc. Devices (CANE) MISC 1 each by Does not apply route as needed.   psyllium (METAMUCIL) 58.6 % powder Take 1 packet by  mouth 2 (two) times daily.     Allergies:    Atorvastatin   Social History: Social History   Socioeconomic History   Marital status: Single    Spouse name: Not on file   Number of children: 3   Years of education: Not on file   Highest education level: Not on file  Occupational History   Occupation: Housewife  Tobacco Use   Smoking status: Never Smoker   Smokeless tobacco: Never Used  Scientific laboratory technician Use: Never used  Substance and Sexual Activity   Alcohol use: No   Drug use: No   Sexual activity: Not Currently  Other Topics Concern   Not on file  Social History Narrative   Not on file   Social Determinants of Health   Financial Resource Strain:    Difficulty of Paying Living Expenses: Not on file  Food Insecurity:    Worried About Charity fundraiser in the Last Year: Not on file   [employer] of Food in the Last Year: Not on file  Transportation Needs:    Lack of Transportation (Medical): Not on file   Lack of Transportation (Non-Medical): Not on file  Physical Activity:    Days of Exercise per Week: Not on file   Minutes of Exercise per Session: Not on file  Stress:    Feeling of Stress : Not on file  Social Connections:    Frequency of Communication with Friends and Family: Not on file   Frequency of Social Gatherings with Friends and Family: Not on file   Attends Religious Services: Not on file   Active Member of Clubs or Organizations: Not on file   Attends Archivist Meetings: Not on file   Marital Status: Not on file     Family History: The patient's family history includes Cancer in her maternal aunt; Diabetes in her father and mother; Heart disease in her father and mother; Hypertension in her father and mother.  ROS:   All other ROS reviewed and negative. Pertinent positives noted in the HPI.     EKGs/Labs/Other Studies Reviewed:   The following studies were personally reviewed by me today:  EKG:  EKG is  ordered today.  The ekg ordered today demonstrates normal sinus rhythm, heart rate 69, no acute ischemic changes, nonspecific ST-T changes noted, and was personally reviewed by me.   Recent Labs: 02/18/2019: Hemoglobin 13.9; Platelets 308 12/19/2019: ALT 23; BUN 13; Creatinine, Ser 0.77; Potassium 4.5; Sodium 139   Recent Lipid Panel    Component Value Date/Time   CHOL 241 (H) 12/19/2019 1038   TRIG 202 (H) 12/19/2019 1038   HDL 53 12/19/2019 1038   CHOLHDL 3.9 06/21/2019 1518  CHOLHDL 4.8 07/12/2015 1008   VLDL 14 07/12/2015 1008   LDLCALC 151 (H) 12/19/2019 1038    Physical Exam:   VS:  BP 128/80    Pulse 69    Ht 5' 2"  (1.575 m)    Wt 172 lb 6.4 oz (78.2 kg)    SpO2 97%    BMI 31.53 kg/m    Wt Readings from Last 3 Encounters:  02/12/20 172 lb 6.4 oz (78.2 kg)  12/19/19 175 lb (79.4 kg)  06/21/19 167 lb (75.8 kg)    General: Well nourished, well developed, in no acute distress Heart: Atraumatic, normal size  Eyes: PEERLA, EOMI  Neck: Supple, no JVD Endocrine: No thryomegaly Cardiac: Normal S1, S2; RRR; no murmurs, rubs, or gallops Lungs: Clear to auscultation bilaterally, no wheezing, rhonchi or rales  Abd: Soft, nontender, no hepatomegaly  Ext: No edema, diminished lower extremity pulses in the right lower extremity, diminished pulses in the left lower extremity Musculoskeletal: No deformities, BUE and BLE strength normal and equal Skin: Warm and dry, no rashes   Neuro: Alert and oriented to person, place, time, and situation, CNII-XII grossly intact, no focal deficits  Psych: Normal mood and affect   ASSESSMENT:   Jill Anderson is a 54 y.o. female who presents for the following: 1. Mixed hyperlipidemia   2. Leg pain, bilateral     PLAN:   1. Mixed hyperlipidemia -Intolerant to atorvastatin and Zetia.  Unclear dose of what Zetia or atorvastatin she was on.  We will start with a different class.  We will try her on Crestor 5 mg nightly.  If she notices cramping we will  try Crestor Monday Wednesday Friday.  She also would likely be a good candidate for bempedoic acid.  We will see if she can get on a statin at all.  I will see her back in 3 months she will give [place] a fasting lipid profile 1 week before.  2. Leg pain, bilateral -She does have bilateral leg cramping.  This occurs with exertion.  Symptoms are concerning for claudication.  She has no real strong risk factors for PAD other than hyperlipidemia.  She is not a heavy smoker.  We will proceed with vascular ultrasounds of the lower extremities.  Disposition: Return in about 3 months (around 05/13/2020).  Medication Adjustments/Labs and Tests Ordered: Current medicines are reviewed at length with the patient today.  Concerns regarding medicines are outlined above.  Orders Placed This Encounter  Procedures   Lipid panel   EKG 12-Lead   VAS [place] LOWER EXTREMITY ARTERIAL DUPLEX   Meds ordered this encounter  Medications   rosuvastatin (CRESTOR) 5 MG tablet    Sig: Take 1 tablet (5 mg total) by mouth daily.    Dispense:  90 tablet    Refill:  3    Patient Instructions  Medication Instructions:  Start Crestor 5 mg daily   *If you need a refill on your cardiac medications before your next appointment, please call your pharmacy*   Lab Work: LIPID (1 week before follow up in 3 months, no lab appointment needed- come fasting, nothing to eat or drink)   If you have labs (blood work) drawn today and your tests are completely normal, you will receive your results only by:  [place] (if you have MyChart) OR  A paper copy in the mail If you have any lab test that is abnormal or we need to change your treatment, we will call you to review the  results.  Testing: Your physician has requested that you have a lower or upper extremity arterial duplex. This test is an ultrasound of the arteries in the legs or arms. It looks at arterial blood flow in the legs and arms. Allow one hour for Lower and  Upper Arterial scans. There are no restrictions or special instructions  Follow-Up: At [facility], you and your health needs are our priority.  As part of our continuing mission to provide you with exceptional heart care, we have created designated Provider Care Teams.  These Care Teams include your primary Cardiologist (physician) and Advanced Practice Providers (APPs -  Physician Assistants and Nurse Practitioners) who all work together to provide you with the care you need, when you need it.  We recommend signing up for the patient portal called "MyChart".  Sign up information is provided on this After Visit Summary.  MyChart is used to connect with patients for Virtual Visits (Telemedicine).  Patients are able to view lab/test results, encounter notes, upcoming appointments, etc.  Non-urgent messages can be sent to your provider as well.   To learn more about what you can do with MyChart, go to [email or web address].    Your next appointment:   3 month(s)  The format for your next appointment:   In Person  Provider:   Eleonore Chiquito, MD      Signed, Addison Naegeli. Audie Box, [place]  [address], [place], [place] [address] [phone]  02/12/2020 12:04 PM

## 2020-02-12 ENCOUNTER — Encounter: Payer: Self-pay | Admitting: Cardiovascular Disease

## 2020-02-12 ENCOUNTER — Ambulatory Visit: Payer: Medicare HMO | Admitting: Cardiovascular Disease

## 2020-02-12 ENCOUNTER — Other Ambulatory Visit: Payer: Self-pay

## 2020-02-12 VITALS — BP 128/80 | HR 69 | Ht 62.0 in | Wt 172.4 lb

## 2020-02-12 DIAGNOSIS — E782 Mixed hyperlipidemia: Secondary | ICD-10-CM | POA: Diagnosis not present

## 2020-02-12 DIAGNOSIS — M79604 Pain in right leg: Secondary | ICD-10-CM | POA: Diagnosis not present

## 2020-02-12 DIAGNOSIS — M79605 Pain in left leg: Secondary | ICD-10-CM

## 2020-02-12 MED ORDER — ROSUVASTATIN CALCIUM 5 MG PO TABS
5.0000 mg | ORAL_TABLET | Freq: Every day | ORAL | 3 refills | Status: DC
Start: 2020-02-12 — End: 2021-08-06

## 2020-02-12 NOTE — Patient Instructions (Addendum)
Medication Instructions:  Start Crestor 5 mg daily   *If you need a refill on your cardiac medications before your next appointment, please call your pharmacy*   Lab Work: LIPID (1 week before follow up in 3 months, no lab appointment needed- come fasting, nothing to eat or drink)   If you have labs (blood work) drawn today and your tests are completely normal, you will receive your results only by: Marland Kitchen MyChart Message (if you have MyChart) OR . A paper copy in the mail If you have any lab test that is abnormal or we need to change your treatment, we will call you to review the results.  Testing: Your physician has requested that you have a lower or upper extremity arterial duplex. This test is an ultrasound of the arteries in the legs or arms. It looks at arterial blood flow in the legs and arms. Allow one hour for Lower and Upper Arterial scans. There are no restrictions or special instructions  Follow-Up: At [facility], you and your health needs are our priority.  As part of our continuing mission to provide you with exceptional heart care, we have created designated Provider Care Teams.  These Care Teams include your primary Cardiologist (physician) and Advanced Practice Providers (APPs -  Physician Assistants and Nurse Practitioners) who all work together to provide you with the care you need, when you need it.  We recommend signing up for the patient portal called "MyChart".  Sign up information is provided on this After Visit Summary.  MyChart is used to connect with patients for Virtual Visits (Telemedicine).  Patients are able to view lab/test results, encounter notes, upcoming appointments, etc.  Non-urgent messages can be sent to your provider as well.   To learn more about what you can do with MyChart, go to [email or web address].    Your next appointment:   3 month(s)  The format for your next appointment:   In Person  Provider:   Lennie Odor, MD

## 2020-02-26 ENCOUNTER — Other Ambulatory Visit: Payer: Self-pay | Admitting: Cardiovascular Disease

## 2020-02-26 ENCOUNTER — Other Ambulatory Visit: Payer: Self-pay | Admitting: Family Medicine

## 2020-02-26 DIAGNOSIS — G8929 Other chronic pain: Secondary | ICD-10-CM

## 2020-02-26 DIAGNOSIS — M5010 Cervical disc disorder with radiculopathy, unspecified cervical region: Secondary | ICD-10-CM

## 2020-02-26 DIAGNOSIS — I739 Peripheral vascular disease, unspecified: Secondary | ICD-10-CM

## 2020-03-07 ENCOUNTER — Other Ambulatory Visit: Payer: Self-pay

## 2020-03-07 ENCOUNTER — Ambulatory Visit (HOSPITAL_COMMUNITY)
Admission: RE | Admit: 2020-03-07 | Discharge: 2020-03-07 | Disposition: A | Discharge status: Home / Self Care | Payer: Medicare HMO | Source: Ambulatory Visit | Attending: Cardiology | Admitting: Cardiology

## 2020-03-07 DIAGNOSIS — I739 Peripheral vascular disease, unspecified: Secondary | ICD-10-CM | POA: Insufficient documentation

## 2020-03-07 DIAGNOSIS — M79604 Pain in right leg: Secondary | ICD-10-CM | POA: Insufficient documentation

## 2020-03-07 DIAGNOSIS — M79605 Pain in left leg: Secondary | ICD-10-CM | POA: Diagnosis not present

## 2020-03-19 ENCOUNTER — Ambulatory Visit
Admission: EM | Admit: 2020-03-19 | Discharge: 2020-03-19 | Disposition: A | Discharge status: Home / Self Care | Payer: Medicare HMO | Attending: Emergency Medicine | Admitting: Emergency Medicine

## 2020-03-19 ENCOUNTER — Other Ambulatory Visit: Payer: Self-pay

## 2020-03-19 ENCOUNTER — Encounter: Payer: Self-pay | Admitting: Emergency Medicine

## 2020-03-19 DIAGNOSIS — K649 Unspecified hemorrhoids: Secondary | ICD-10-CM

## 2020-03-19 MED ORDER — HYDROCORTISONE ACETATE 25 MG RE SUPP
25.0000 mg | Freq: Two times a day (BID) | RECTAL | 0 refills | Status: DC
Start: 2020-03-19 — End: 2023-04-24

## 2020-03-19 NOTE — ED Triage Notes (Signed)
Pt here for hemorrhoids with increased pain and some bleeding x 2 weeks; pt sts OTC treatments not helping

## 2020-03-19 NOTE — Discharge Instructions (Addendum)
Sitz bath as needed for additional pain relief: For few inches of warm, plain water and sit. Avoid Epson salt as this can burn. Use rectal suppositories as directed. Very important follow-up with surgery for further evaluation and management. Go to ER for severe pain, bleeding, fever. 

## 2020-03-19 NOTE — ED Provider Notes (Signed)
[facility]    CSN: [id number] Arrival date & time: 03/19/20  1507      History   Chief Complaint Chief Complaint  Patient presents with  . Hemorrhoids    HPI Jill Anderson is a 54 y.o. female  With known history of hemorrhoids presenting for the same.  States has been ongoing for the last 2 weeks.  Has been trying stool softener, Preparation H wipes and creams without relief.  Does have some bright red blood on toilet paper.  Denies melena, abdominal pain, nausea or vomiting, fever.  States rectal suppositories typically help, though cannot find them over-the-counter.  Past Medical History:  Diagnosis Date  . Borderline diabetic   . Chronic back pain   . Diabetes mellitus without complication ([place])   . Hypercholesteremia     Patient Active Problem List   Diagnosis Date Noted  . Myalgia due to statin 12/12/2018  . Vitamin D deficiency 09/21/2017  . Insomnia 05/14/2017  . Menopausal symptoms 01/04/2017  . Urticaria 12/14/2016  . Pain in both hands 12/14/2016  . Hypertension 10/27/2016  . Bilateral anterior knee pain 03/20/2016  . Left ureteral stone 10/27/2015  . New onset of headaches after age 57 10/07/2015  . Hyperlipidemia associated with type 2 diabetes mellitus ([place]) 07/15/2015  . DM2 (diabetes mellitus, type 2) ([place]) 03/04/2015  . Chronic neck pain 09/11/2014  . Cervical disc disorder with radiculopathy of cervical region 09/11/2014  . Chronic radicular low back pain 09/11/2014    Past Surgical History:  Procedure Laterality Date  . ABDOMINAL HYSTERECTOMY    . BACK SURGERY    . CYSTOSCOPY WITH RETROGRADE PYELOGRAM, URETEROSCOPY AND STENT PLACEMENT Left 10/28/2015   Procedure: CYSTOSCOPY WITH RETROGRADE PYELOGRAM, URETEROSCOPY AND STENT PLACEMENT, AND LASER;  Surgeon: Irine Seal, MD;  Location: [facility];  Service: Urology;  Laterality: Left;    OB History   No obstetric history on file.      Home Medications    Prior to Admission  medications   Medication Sig Start Date End Date Taking? Authorizing Provider  Accu-Chek FastClix Lancets MISC USE AS DIRECTED to check blood sugar once daily. 12/22/19   Charlott Rakes, MD  acetaminophen-codeine (TYLENOL #3) 300-30 MG tablet TAKE 1 TABLET BY MOUTH EVERY 12 HOURS AS NEEDED FOR MODERATE PAIN 12/19/19   Charlott Rakes, MD  aspirin EC 81 MG tablet Take 1 tablet (81 mg total) by mouth daily. 09/29/16   Funches, Adriana Mccallum, MD  azelastine (ASTELIN) 0.1 % nasal spray Place 2 sprays into both nostrils 2 (two) times daily. 09/29/19   Tasia Catchings, Amy V, PA-C  Blood Glucose Monitoring Suppl (ACCU-CHEK GUIDE ME) w/Device KIT 1 each by Does not apply route daily. 06/13/18   Charlott Rakes, MD  cetirizine (ZYRTEC ALLERGY) 10 MG tablet Take 1 tablet (10 mg total) by mouth daily. 10/26/19   Hall-Potvin, [place], PA-C  Elastic Bandages & Supports ([place] M 15-20MMHG) MISC 1 each by Does not apply route daily. 03/20/16   Funches, Adriana Mccallum, MD  erythromycin ophthalmic ointment Place 1 application into the left eye every 6 (six) hours. Place 1/2 inch ribbon of ointment in the affected eye 4 times a day for 5 days Patient not taking: Reported on 03/19/2020 02/18/19   Margarita Mail, PA-C  famotidine (PEPCID) 20 MG tablet Take 1 tablet (20 mg total) by mouth 2 (two) times daily. 11/21/18   Hedges, Dellis Filbert, PA-C  fluticasone (FLONASE) 50 MCG/ACT nasal spray Place 2 sprays into both nostrils daily. 09/29/19  Yu, Amy V, PA-C  glucose blood (ACCU-CHEK GUIDE) test strip USE AS DIRECTED to check blood sugar once daily. 12/22/19   Charlott Rakes, MD  hydrocortisone (ANUSOL-HC) 25 MG suppository Place 1 suppository (25 mg total) rectally 2 (two) times daily. 03/19/20   Hall-Potvin, [place], PA-C  ibuprofen (ADVIL) 800 MG tablet TAKE 1 TABLET BY MOUTH EVERY 12 HOURS AS NEEDED 02/26/20   Charlott Rakes, MD  metFORMIN (GLUCOPHAGE) 500 MG tablet Take by mouth.    [provider]  Misc. Devices (CANE) MISC 1  each by Does not apply route as needed. 03/04/15   Funches, Adriana Mccallum, MD  psyllium (METAMUCIL) 58.6 % powder Take 1 packet by mouth 2 (two) times daily.    [provider]  rosuvastatin (CRESTOR) 5 MG tablet Take 1 tablet (5 mg total) by mouth daily. 02/12/20 05/12/20  O'NealCassie Freer, MD    Family History Family History  Problem Relation Age of Onset  . Diabetes Mother   . Hypertension Mother   . Heart disease Mother        based at age 10 of MI  . Diabetes Father   . Hypertension Father   . Heart disease Father   . Cancer Maternal Aunt        liver    Social History Social History   Tobacco Use  . Smoking status: Never Smoker  . Smokeless tobacco: Never Used  Vaping Use  . Vaping Use: Never used  Substance Use Topics  . Alcohol use: No  . Drug use: No     Allergies   Atorvastatin   Review of Systems As per HPI   Physical Exam Triage Vital Signs ED Triage Vitals  Enc Vitals Group     BP      Pulse      Resp      Temp      Temp src      SpO2      Weight      Height      Head Circumference      Peak Flow      Pain Score      Pain Loc      Pain Edu?      Excl. in [place]?    No data found.  Updated Vital Signs BP (!) 141/85 (BP Location: Right Arm)   Pulse 69   Temp 97.7 F (36.5 C) (Oral)   Resp 20   SpO2 96%   Visual Acuity Right Eye Distance:   Left Eye Distance:   Bilateral Distance:    Right Eye Near:   Left Eye Near:    Bilateral Near:     Physical Exam Constitutional:      General: She is not in acute distress. HENT:     Head: Normocephalic and atraumatic.  Eyes:     General: No scleral icterus.    Pupils: Pupils are equal, round, and reactive to light.  Cardiovascular:     Rate and Rhythm: Normal rate.  Pulmonary:     Effort: Pulmonary effort is normal.  Skin:    Coloration: Skin is not jaundiced or pale.  Neurological:     Mental Status: She is alert and oriented to person, place, and time.      UC  Treatments / Results  Labs (all labs ordered are listed, but only abnormal results are displayed) Labs Reviewed - No data to display  EKG   Radiology No results found.  Procedures Procedures (including  critical care time)  Medications Ordered in UC Medications - No data to display  Initial Impression / Assessment and Plan / UC Course  I have reviewed the triage vital signs and the nursing notes.  Pertinent labs & imaging results that were available during my care of the patient were reviewed by me and considered in my medical decision making (see chart for details).     We will provide Anusol suppositories.  Reviewed supportive care as below as well as providing surgical contact information for further evaluation management.  Return precautions discussed, pt verbalized understanding and is agreeable to plan. Final Clinical Impressions(s) / UC Diagnoses   Final diagnoses:  Hemorrhoids, unspecified hemorrhoid type     Discharge Instructions     Sitz bath as needed for additional pain relief: For few inches of warm, plain water and sit. Avoid Epson salt as this can burn. Use rectal suppositories as directed. Very important follow-up with surgery for further evaluation and management. Go to ER for severe pain, bleeding, fever.    ED Prescriptions    Medication Sig Dispense Auth. Provider   hydrocortisone (ANUSOL-HC) 25 MG suppository Place 1 suppository (25 mg total) rectally 2 (two) times daily. 12 suppository Hall-Potvin, [place], PA-C     PDMP not reviewed this encounter.   Hall-Potvin, [place], [place] 03/19/20 1801

## 2020-04-10 DIAGNOSIS — K6289 Other specified diseases of anus and rectum: Secondary | ICD-10-CM | POA: Diagnosis not present

## 2020-04-16 ENCOUNTER — Ambulatory Visit: Payer: Medicare HMO | Attending: Family Medicine | Admitting: Family Medicine

## 2020-04-16 ENCOUNTER — Other Ambulatory Visit: Payer: Self-pay

## 2020-04-16 ENCOUNTER — Encounter: Payer: Self-pay | Admitting: Family Medicine

## 2020-04-16 DIAGNOSIS — E1169 Type 2 diabetes mellitus with other specified complication: Secondary | ICD-10-CM | POA: Diagnosis not present

## 2020-04-16 DIAGNOSIS — M7122 Synovial cyst of popliteal space [Baker], left knee: Secondary | ICD-10-CM

## 2020-04-16 DIAGNOSIS — J328 Other chronic sinusitis: Secondary | ICD-10-CM | POA: Diagnosis not present

## 2020-04-16 MED ORDER — DICLOFENAC SODIUM 1 % EX GEL: 4.0000 g | Freq: Four times a day (QID) | CUTANEOUS | 1 refills | Status: DC

## 2020-04-16 MED ORDER — METFORMIN HCL 500 MG PO TABS: 1000.0000 mg | ORAL_TABLET | Freq: Two times a day (BID) | ORAL | 6 refills | Status: DC

## 2020-04-16 MED ORDER — FLUTICASONE PROPIONATE 50 MCG/ACT NA SUSP: 2.0000 | Freq: Every day | NASAL | 0 refills | Status: DC

## 2020-04-16 NOTE — Progress Notes (Signed)
Virtual Visit via Telephone Note  I connected with Jill Anderson, on 04/16/2020 at 9:15 AM by telephone due to the COVID-19 pandemic and verified that I am speaking with the correct person using two identifiers.   Consent: I discussed the limitations, risks, security and privacy concerns of performing an evaluation and management service by telephone and the availability of in person appointments. I also discussed with the patient that there may be a patient responsible charge related to this service. The patient expressed understanding and agreed to proceed.   Location of Patient: Home  Location of Provider: Clinic   Persons participating in Telemedicine visit: Belvie Iribe Farrington-CMA Dr. Margarita Rana     History of Present Illness: Jill Anderson is a 54 year old female with a history of Type 2 Diabetes Mellitus (A1c 6.4), Hypercholesterolemia, chronic neck and chronic low back pain secondary to degenerative changes of the spine(declines surgical management), Insomnia who presents today for an acute visit.  Complains of L posterior knee swelling which she thinks is a Baker's cyst to which she has been applying ice and Biofreeze. Symptoms have been present for 1 month. Vascular ultrasound of lower extremity arteries on 03/07/2020: Summary:  Right: Resting right ankle-brachial index is within normal range. No  evidence of significant right lower extremity arterial disease. The right  toe-brachial index is normal.   Left: Resting left ankle-brachial index is within normal range. No  evidence of significant left lower extremity arterial disease. The left  toe-brachial index is normal.   Her sugars have been 156 and 220 fasting despite compliance with Metformin 552m bid. She has not changed her diet or lifestyle.  She is requesting a refill of Flonase for her sinuses. Past Medical History:  Diagnosis Date  . Borderline diabetic   . Chronic back pain   . [place]   . Hypercholesteremia    Allergies  Allergen Reactions  . Atorvastatin Other (See Comments)    myalgia    Current Outpatient Medications on File Prior to Visit  Medication Sig Dispense Refill  . Accu-Chek FastClix Lancets MISC USE AS DIRECTED to check blood sugar once daily. 100 each 11  . acetaminophen-codeine (TYLENOL #3) 300-30 MG tablet TAKE 1 TABLET BY MOUTH EVERY 12 HOURS AS NEEDED FOR MODERATE PAIN 60 tablet 2  . aspirin EC 81 MG tablet Take 1 tablet (81 mg total) by mouth daily. 30 tablet 11  . azelastine (ASTELIN) 0.1 % nasal spray Place 2 sprays into both nostrils 2 (two) times daily. 30 mL 0  . Blood Glucose Monitoring Suppl (ACCU-CHEK GUIDE ME) w/Device KIT 1 each by Does not apply route daily. 1 kit 0  . cetirizine (ZYRTEC ALLERGY) 10 MG tablet Take 1 tablet (10 mg total) by mouth daily. 30 tablet 0  . Elastic Bandages & Supports (TRUFORM ARM S23M 15-20MMHG) MISC 1 each by Does not apply route daily. 1 each 0  . famotidine (PEPCID) 20 MG tablet Take 1 tablet (20 mg total) by mouth 2 (two) times daily. 30 tablet 0  . fluticasone (FLONASE) 50 MCG/ACT nasal spray Place 2 sprays into both nostrils daily. 1 g 0  . glucose blood (ACCU-CHEK GUIDE) test strip USE AS DIRECTED to check blood sugar once daily. 100 each 11  . hydrocortisone (ANUSOL-HC) 25 MG suppository Place 1 suppository (25 mg total) rectally 2 (two) times daily. 12 suppository 0  . ibuprofen (ADVIL) 800 MG tablet TAKE 1 TABLET BY MOUTH EVERY 12 HOURS AS  NEEDED 60 tablet 0  . metFORMIN (GLUCOPHAGE) 500 MG tablet Take by mouth.    . Misc. Devices (CANE) MISC 1 each by Does not apply route as needed. 1 each 0  . psyllium (METAMUCIL) 58.6 % powder Take 1 packet by mouth 2 (two) times daily.    . rosuvastatin (CRESTOR) 5 MG tablet Take 1 tablet (5 mg total) by mouth daily. 90 tablet 3  . erythromycin ophthalmic ointment Place 1 application into the left eye every 6 (six) hours. Place 1/2  inch ribbon of ointment in the affected eye 4 times a day for 5 days (Patient not taking: Reported on 03/19/2020) 3.5 g 0   No current facility-administered medications on file prior to visit.    Observations/Objective: Awake, alert, oriented x3 Not in acute distress  Lab Results  Component Value Date   HGBA1C 6.4 12/19/2019    Assessment and Plan: 1. Type 2 diabetes mellitus with other specified complication, without long-term current use of insulin (HCC) Controlled with A1c of 6.4 Recent blood sugar logs reveal blood sugars trending up Advised to increase Metformin dose to 2 tablets twice daily - metFORMIN (GLUCOPHAGE) 500 MG tablet; Take 2 tablets (1,000 mg total) by mouth 2 (two) times daily with a meal.  Dispense: 120 tablet; Refill: 6  2. Synovial cyst of left popliteal space Discussed conservative management including application of ice, use of topical NSAID If symptoms persist, will refer for ultrasound and possibly orthopedic referral. - diclofenac Sodium (VOLTAREN) 1 % GEL; Apply 4 g topically 4 (four) times daily.  Dispense: 100 g; Refill: 1  3. Other chronic sinusitis Stable - fluticasone (FLONASE) 50 MCG/ACT nasal spray; Place 2 sprays into both nostrils daily.  Dispense: 1 g; Refill: 0   Follow Up Instructions: Keep previous appointment for chronic disease management   I discussed the assessment and treatment plan with the patient. The patient was provided an opportunity to ask questions and all were answered. The patient agreed with the plan and demonstrated an understanding of the instructions.   The patient was advised to call back or seek an in-person evaluation if the symptoms worsen or if the condition fails to improve as anticipated.     I provided 12 minutes total of non-face-to-face time during this encounter including median intraservice time, reviewing previous notes, investigations, ordering medications, medical decision making, coordinating care and  patient verbalized understanding at the end of the visit.     Charlott Rakes, MD, FAAFP. [facility] and [place], [place]   04/16/2020, 9:15 AM

## 2020-04-16 NOTE — Progress Notes (Signed)
Having pain in back of left leg.

## 2020-05-14 ENCOUNTER — Ambulatory Visit: Payer: Medicare HMO | Admitting: Family Medicine

## 2020-05-22 NOTE — Progress Notes (Deleted)
Cardiology Office Note:   Date:  05/22/2020  NAME:  Jill Anderson    MRN: [id number] DOB:  February 13, 1966   PCP:  Hoy Register, MD  Cardiologist:  No primary care provider on file.  Electrophysiologist:  None   Referring MD: Hoy Register, MD   No chief complaint on file. ***  History of Present Illness:   Jill Anderson is a 54 y.o. female with a hx of DM, HLD who presents for follow-up. Seen several months ago for statin intolerance. Placed on crestor 5 mg daily with plans to follow-up today.    Problem List 1. Diabetes -A1c 6.4 2. HLD -T chol 241, HDL 51, LDL 151, TG 202  Past Medical History: Past Medical History:  Diagnosis Date  . Borderline diabetic   . Chronic back pain   . Diabetes mellitus without complication (HCC)   . Hypercholesteremia     Past Surgical History: Past Surgical History:  Procedure Laterality Date  . ABDOMINAL HYSTERECTOMY    . BACK SURGERY    . CYSTOSCOPY WITH RETROGRADE PYELOGRAM, URETEROSCOPY AND STENT PLACEMENT Left 10/28/2015   Procedure: CYSTOSCOPY WITH RETROGRADE PYELOGRAM, URETEROSCOPY AND STENT PLACEMENT, AND LASER;  Surgeon: Bjorn Pippin, MD;  Location: [facility];  Service: Urology;  Laterality: Left;    Current Medications: No outpatient medications have been marked as taking for the 05/23/20 encounter (Appointment) with O'Neal, Ronnald Ramp, MD.     Allergies:    Atorvastatin   Social History: Social History   Socioeconomic History  . Marital status: Single    Spouse name: Not on file  . Number of children: 3  . Years of education: Not on file  . Highest education level: Not on file  Occupational History  . Occupation: Housewife  Tobacco Use  . Smoking status: Never Smoker  . Smokeless tobacco: Never Used  Vaping Use  . Vaping Use: Never used  Substance and Sexual Activity  . Alcohol use: No  . Drug use: No  . Sexual activity: Not Currently  Other Topics Concern  . Not on file  Social History Narrative  . Not on  file   Social Determinants of Health   Financial Resource Strain: Not on file  Food Insecurity: Not on file  Transportation Needs: Not on file  Physical Activity: Not on file  Stress: Not on file  Social Connections: Not on file     Family History: The patient's ***family history includes Cancer in her maternal aunt; Diabetes in her father and mother; Heart disease in her father and mother; Hypertension in her father and mother.  ROS:   All other ROS reviewed and negative. Pertinent positives noted in the HPI.     EKGs/Labs/Other Studies Reviewed:   The following studies were personally reviewed by me today:  EKG:  EKG is *** ordered today.  The ekg ordered today demonstrates ***, and was personally reviewed by me.   ABI 03/07/2020 Right: Resting right ankle-brachial index is within normal range. No  evidence of significant right lower extremity arterial disease. The right  toe-brachial index is normal.   Left: Resting left ankle-brachial index is within normal range. No  evidence of significant left lower extremity arterial disease. The left  toe-brachial index is normal.   Recent Labs: 12/19/2019: ALT 23; BUN 13; Creatinine, Ser 0.77; Potassium 4.5; Sodium 139   Recent Lipid Panel    Component Value Date/Time   CHOL 241 (H) 12/19/2019 1038   TRIG 202 (H) 12/19/2019 1038   HDL 53  12/19/2019 1038   CHOLHDL 3.9 06/21/2019 1518   CHOLHDL 4.8 07/12/2015 1008   VLDL 14 07/12/2015 1008   LDLCALC 151 (H) 12/19/2019 1038    Physical Exam:   VS:  There were no vitals taken for this visit.   Wt Readings from Last 3 Encounters:  02/12/20 172 lb 6.4 oz (78.2 kg)  12/19/19 175 lb (79.4 kg)  06/21/19 167 lb (75.8 kg)    General: Well nourished, well developed, in no acute distress Head: Atraumatic, normal size  Eyes: PEERLA, EOMI  Neck: Supple, no JVD Endocrine: No thryomegaly Cardiac: Normal S1, S2; RRR; no murmurs, rubs, or gallops Lungs: Clear to auscultation  bilaterally, no wheezing, rhonchi or rales  Abd: Soft, nontender, no hepatomegaly  Ext: No edema, pulses 2+ Musculoskeletal: No deformities, BUE and BLE strength normal and equal Skin: Warm and dry, no rashes   Neuro: Alert and oriented to person, place, time, and situation, CNII-XII grossly intact, no focal deficits  Psych: Normal mood and affect   ASSESSMENT:   Jill Anderson is a 54 y.o. female who presents for the following: No diagnosis found.  PLAN:   There are no diagnoses linked to this encounter.  Disposition: No follow-ups on file.  Medication Adjustments/Labs and Tests Ordered: Current medicines are reviewed at length with the patient today.  Concerns regarding medicines are outlined above.  No orders of the defined types were placed in this encounter.  No orders of the defined types were placed in this encounter.   There are no Patient Instructions on file for this visit.   Time Spent with Patient: I have spent a total of *** minutes with patient reviewing hospital notes, telemetry, EKGs, labs and examining the patient as well as establishing an assessment and plan that was discussed with the patient.  > 50% of time was spent in direct patient care.  Signed, Lenna Gilford. Lorrie Lipps, MD [facility]  [address] [place], [place] [address] [phone]  05/22/2020 4:25 PM

## 2020-05-23 ENCOUNTER — Ambulatory Visit: Payer: Medicare HMO | Admitting: Cardiovascular Disease

## 2020-06-05 ENCOUNTER — Ambulatory Visit: Payer: Medicare HMO | Admitting: Family Medicine

## 2020-06-13 ENCOUNTER — Other Ambulatory Visit: Payer: Self-pay | Admitting: Family Medicine

## 2020-06-13 DIAGNOSIS — M5010 Cervical disc disorder with radiculopathy, unspecified cervical region: Secondary | ICD-10-CM

## 2020-06-13 DIAGNOSIS — G8929 Other chronic pain: Secondary | ICD-10-CM

## 2020-09-10 ENCOUNTER — Other Ambulatory Visit: Payer: Self-pay | Admitting: Family Medicine

## 2020-09-10 DIAGNOSIS — M5010 Cervical disc disorder with radiculopathy, unspecified cervical region: Secondary | ICD-10-CM

## 2020-09-10 DIAGNOSIS — G8929 Other chronic pain: Secondary | ICD-10-CM

## 2020-09-10 DIAGNOSIS — M5416 Radiculopathy, lumbar region: Secondary | ICD-10-CM

## 2020-09-10 NOTE — Telephone Encounter (Signed)
Requested medication (s) are due for refill today: no  Requested medication (s) are on the active medication list: yes  Last refill:  06/13/2020  Future visit scheduled: yes  Notes to clinic:  this refill cannot be delegated    Requested Prescriptions  Pending Prescriptions Disp Refills   acetaminophen-codeine (TYLENOL #3) 300-30 MG tablet [Pharmacy Med Name: Acetaminophen-Codeine #3 300-30 MG Oral Tablet] 60 tablet 0    Sig: TAKE 1 TABLET BY MOUTH EVERY 12 HOURS AS NEEDED FOR MODERATE PAIN      Not Delegated - Analgesics:  Opioid Agonist Combinations Failed - 09/10/2020  3:00 PM      Failed - This refill cannot be delegated      Failed - Urine Drug Screen completed in last 360 days      Passed - Valid encounter within last 6 months    Recent Outpatient Visits           4 months ago Type 2 diabetes mellitus with other specified complication, without long-term current use of insulin (HCC)   [facility] [place], [place], [place]   8 months ago Type 2 diabetes mellitus with other specified complication, without long-term current use of insulin (HCC)   [facility] [place], [place], MD   11 months ago Cervical disc disorder with radiculopathy of cervical region   [facility] [place], Odette Horns, MD   1 year ago Cervical disc disorder with radiculopathy of cervical region   [facility] [place], [place], MD   1 year ago Type 2 diabetes mellitus with other specified complication, without long-term current use of insulin (HCC)   [facility] Hoy Register, MD       Future Appointments             In 2 weeks Hoy Register, MD [facility]

## 2020-09-25 ENCOUNTER — Encounter: Payer: Self-pay | Admitting: Family Medicine

## 2020-09-25 ENCOUNTER — Ambulatory Visit: Payer: Medicare HMO | Attending: Family Medicine | Admitting: Family Medicine

## 2020-09-25 ENCOUNTER — Other Ambulatory Visit: Payer: Self-pay

## 2020-09-25 VITALS — BP 134/74 | HR 71 | Ht 62.0 in | Wt 174.2 lb

## 2020-09-25 DIAGNOSIS — G8929 Other chronic pain: Secondary | ICD-10-CM | POA: Diagnosis not present

## 2020-09-25 DIAGNOSIS — M5010 Cervical disc disorder with radiculopathy, unspecified cervical region: Secondary | ICD-10-CM | POA: Diagnosis not present

## 2020-09-25 DIAGNOSIS — Z1159 Encounter for screening for other viral diseases: Secondary | ICD-10-CM

## 2020-09-25 DIAGNOSIS — E1169 Type 2 diabetes mellitus with other specified complication: Secondary | ICD-10-CM

## 2020-09-25 DIAGNOSIS — Z130 Encounter for screening for diseases of the blood and blood-forming organs and certain disorders involving the immune mechanism: Secondary | ICD-10-CM | POA: Diagnosis not present

## 2020-09-25 DIAGNOSIS — M5416 Radiculopathy, lumbar region: Secondary | ICD-10-CM

## 2020-09-25 LAB — POCT GLYCOSYLATED HEMOGLOBIN (HGB A1C): HbA1c, POC (controlled diabetic range): 7.1 % — AB (ref 0.0–7.0)

## 2020-09-25 LAB — GLUCOSE, POCT (MANUAL RESULT ENTRY): POC Glucose: 108 mg/dl — AB (ref 70–99)

## 2020-09-25 MED ORDER — DAPAGLIFLOZIN PROPANEDIOL 5 MG PO TABS
5.0000 mg | ORAL_TABLET | Freq: Every day | ORAL | 6 refills | Status: DC
Start: 2020-09-25 — End: 2021-02-13

## 2020-09-25 MED ORDER — ACETAMINOPHEN-CODEINE #3 300-30 MG PO TABS: ORAL_TABLET | ORAL | 2 refills | Status: DC

## 2020-09-25 NOTE — Patient Instructions (Signed)

## 2020-09-25 NOTE — Progress Notes (Signed)
Subjective:  Patient ID: Jill Anderson, female    DOB: 03-09-1966  Age: 55 y.o. MRN: [id number]  CC: Diabetes   HPI Jill Anderson is a55 year old female with a history of Type 2 Diabetes Mellitus (A1c 7.1), Hypercholesterolemia, chronic neck and chronic low back pain secondary to degenerative changes of the spine(declines surgical management), Insomnia who presents today for chronic disease management.  Would like to have an HIV test along with her routine labs and is fasting.  Metformin makes her bloated and she would like to try something else for her diabetes mellitus. She has no numbness in her hands and feet and has had no hypoglycemic episodes or visual concerns.  She has an upcoming appointment with her ophthalmologist. Currently on 5 mg of Crestor for hyperlipidemia as she has been unable to tolerate other medications but states she still has muscle cramps with the Crestor. Last used Tyleenol #3 last night and uses it once or twice/week alternating with Ibuprofen for her chronic low back pain.  Cervical radiculopathy is controlled at the moment and she has no symptoms but her back pain is rated as an 8/10 and is felt more on her left lower back radiating down her left leg.  Denies use of illicit drugs. Past Medical History:  Diagnosis Date  . Borderline diabetic   . Chronic back pain   . Diabetes mellitus without complication ([place])   . Hypercholesteremia     Past Surgical History:  Procedure Laterality Date  . ABDOMINAL HYSTERECTOMY    . BACK SURGERY    . CYSTOSCOPY WITH RETROGRADE PYELOGRAM, URETEROSCOPY AND STENT PLACEMENT Left 10/28/2015   Procedure: CYSTOSCOPY WITH RETROGRADE PYELOGRAM, URETEROSCOPY AND STENT PLACEMENT, AND LASER;  Surgeon: Irine Seal, MD;  Location: [facility];  Service: Urology;  Laterality: Left;    Family History  Problem Relation Age of Onset  . Diabetes Mother   . Hypertension Mother   . Heart disease Mother        based at age 77 of MI  .  Diabetes Father   . Hypertension Father   . Heart disease Father   . Cancer Maternal Aunt        liver    Allergies  Allergen Reactions  . Atorvastatin Other (See Comments)    myalgia    Outpatient Medications Prior to Visit  Medication Sig Dispense Refill  . aspirin EC 81 MG tablet Take 1 tablet (81 mg total) by mouth daily. 30 tablet 11  . azelastine (ASTELIN) 0.1 % nasal spray Place 2 sprays into both nostrils 2 (two) times daily. 30 mL 0  . Blood Glucose Monitoring Suppl (ACCU-CHEK GUIDE ME) w/Device KIT 1 each by Does not apply route daily. 1 kit 0  . cetirizine (ZYRTEC ALLERGY) 10 MG tablet Take 1 tablet (10 mg total) by mouth daily. 30 tablet 0  . Elastic Bandages & Supports (TRUFORM ARM 75 M 15-20MMHG) MISC 1 each by Does not apply route daily. 1 each 0  . famotidine (PEPCID) 20 MG tablet Take 1 tablet (20 mg total) by mouth 2 (two) times daily. 30 tablet 0  . fluticasone (FLONASE) 50 MCG/ACT nasal spray Place 2 sprays into both nostrils daily. 1 g 0  . glucose blood (ACCU-CHEK GUIDE) test strip USE AS DIRECTED to check blood sugar once daily. 100 each 11  . ibuprofen (ADVIL) 800 MG tablet TAKE 1 TABLET BY MOUTH EVERY 12 HOURS AS NEEDED 60 tablet 3  . Misc. Devices (CANE) MISC 1 each  by Does not apply route as needed. 1 each 0  . psyllium (METAMUCIL) 58.6 % powder Take 1 packet by mouth 2 (two) times daily.    Mar[place] Kitchen acetaminophen-codeine (TYLENOL #3) 300-30 MG tablet TAKE 1 TABLET BY MOUTH EVERY 12 HOURS AS NEEDED FOR MODERATE PAIN 60 tablet 2  . metFORMIN (GLUCOPHAGE) 500 MG tablet Take 2 tablets (1,000 mg total) by mouth 2 (two) times daily with a meal. 120 tablet 6  . Accu-Chek FastClix Lancets MISC USE AS DIRECTED to check blood sugar once daily. 100 each 11  . diclofenac Sodium (VOLTAREN) 1 % GEL Apply 4 g topically 4 (four) times daily. (Patient not taking: Reported on 09/25/2020) 100 g 1  . erythromycin ophthalmic ointment Place 1 application into the left eye every 6  (six) hours. Place 1/2 inch ribbon of ointment in the affected eye 4 times a day for 5 days (Patient not taking: No sig reported) 3.5 g 0  . hydrocortisone (ANUSOL-HC) 25 MG suppository Place 1 suppository (25 mg total) rectally 2 (two) times daily. (Patient not taking: Reported on 09/25/2020) 12 suppository 0  . rosuvastatin (CRESTOR) 5 MG tablet Take 1 tablet (5 mg total) by mouth daily. 90 tablet 3   No facility-administered medications prior to visit.     ROS Review of Systems  Constitutional: Negative for activity change, appetite change and fatigue.  HENT: Negative for congestion, sinus pressure and sore throat.   Eyes: Negative for visual disturbance.  Respiratory: Negative for cough, chest tightness, shortness of breath and wheezing.   Cardiovascular: Negative for chest pain and palpitations.  Gastrointestinal: Negative for abdominal distention, abdominal pain and constipation.  Endocrine: Negative for polydipsia.  Genitourinary: Negative for dysuria and frequency.  Musculoskeletal:       See HPI  Skin: Negative for rash.  Neurological: Negative for tremors, light-headedness and numbness.  Hematological: Does not bruise/bleed easily.  Psychiatric/Behavioral: Negative for agitation and behavioral problems.    Objective:  BP 134/74   Pulse 71   Ht 5' 2"  (1.575 m)   Wt 174 lb 3.2 oz (79 kg)   SpO2 99%   BMI 31.86 kg/m   BP/Weight 09/25/2020 03/19/2020 6/62/9476  Systolic BP 546 503 546  Diastolic BP 74 85 80  Wt. (Lbs) 174.2 - 172.4  BMI 31.86 - 31.53      Physical Exam Constitutional:      Appearance: She is well-developed.  Neck:     Vascular: No JVD.  Cardiovascular:     Rate and Rhythm: Normal rate.     Heart sounds: Normal heart sounds. No murmur heard.   Pulmonary:     Effort: Pulmonary effort is normal.     Breath sounds: Normal breath sounds. No wheezing or rales.  Chest:     Chest wall: No tenderness.  Abdominal:     General: Bowel sounds are  normal. There is no distension.     Palpations: Abdomen is soft. There is no mass.     Tenderness: There is no abdominal tenderness.  Musculoskeletal:     Cervical back: Normal range of motion. No rigidity or tenderness.     Right lower leg: No edema.     Left lower leg: No edema.     Comments: Tenderness on palpation of lumbar spine more on the left side with associated positive straight leg raise on the left. Negative straight leg raise on the right.  Neurological:     Mental Status: She is alert and oriented to person,  place, and time.  Psychiatric:        Mood and Affect: Mood normal.     CMP Latest Ref Rng & Units 12/19/2019 06/21/2019 05/01/2019  Glucose 65 - 99 mg/dL 120(H) 85 146(H)  BUN 6 - 24 mg/dL 13 10 13   Creatinine 0.57 - 1.00 mg/dL 0.77 0.69 0.65  Sodium 134 - 144 mmol/L 139 140 140  Potassium 3.5 - 5.2 mmol/L 4.5 4.2 4.6  Chloride 96 - 106 mmol/L 100 99 103  CO2 20 - 29 mmol/L 23 26 25   Calcium 8.7 - 10.2 mg/dL 9.6 9.9 9.6  Total Protein 6.0 - 8.5 g/dL 7.8 8.3 7.6  Total Bilirubin 0.0 - 1.2 mg/dL 0.5 0.8 0.7  Alkaline Phos 48 - 121 IU/L 69 62 79  AST 0 - 40 IU/L 18 22 18   ALT 0 - 32 IU/L 23 22 24     Lipid Panel     Component Value Date/Time   CHOL 241 (H) 12/19/2019 1038   TRIG 202 (H) 12/19/2019 1038   HDL 53 12/19/2019 1038   CHOLHDL 3.9 06/21/2019 1518   CHOLHDL 4.8 07/12/2015 1008   VLDL 14 07/12/2015 1008   LDLCALC 151 (H) 12/19/2019 1038    CBC    Component Value Date/Time   WBC 7.8 02/18/2019 1433   RBC 4.70 02/18/2019 1433   HGB 13.9 02/18/2019 1433   HGB 14.9 02/15/2019 1714   HCT 43.4 02/18/2019 1433   HCT 46.3 02/15/2019 1714   PLT 308 02/18/2019 1433   PLT 332 02/15/2019 1714   MCV 92.3 02/18/2019 1433   MCV 94 02/15/2019 1714   MCH 29.6 02/18/2019 1433   MCHC 32.0 02/18/2019 1433   RDW 11.8 02/18/2019 1433   RDW 12.1 02/15/2019 1714   LYMPHSABS 3.1 02/18/2019 1433   LYMPHSABS 3.0 08/12/2017 1050   MONOABS 0.6 02/18/2019 1433    EOSABS 0.4 02/18/2019 1433   EOSABS 0.3 08/12/2017 1050   BASOSABS 0.1 02/18/2019 1433   BASOSABS 0.0 08/12/2017 1050    Lab Results  Component Value Date   HGBA1C 7.1 (A) 09/25/2020    Assessment & Plan:  1. Type 2 diabetes mellitus with other specified complication, without long-term current use of insulin (HCC) Controlled with A1c of 7.1; goal is less than 7.0 Due to intolerance of metformin she has been switched to [place] on Diabetic diet, my plate method, 301 minutes of moderate intensity exercise/week Blood sugar logs with fasting goals of 80-120 mg/dl, random of less than 180 and in the event of sugars less than 60 mg/dl or greater than 400 mg/dl encouraged to notify the clinic. Advised on the need for annual eye exams, annual foot exams, Pneumonia vaccine. - POCT glucose (manual entry) - POCT glycosylated hemoglobin (Hb A1C) - Microalbumin / creatinine urine ratio - CMP14+EGFR - Lipid panel  2. Screening for viral disease - HIV Antibody (routine testing w rflx)  3. Cervical disc disorder with radiculopathy of cervical region Stable - acetaminophen-codeine (TYLENOL #3) 300-30 MG tablet; TAKE 1 TABLET BY MOUTH EVERY 12 HOURS AS NEEDED FOR MODERATE PAIN  Dispense: 60 tablet; Refill: 2  4. Chronic radicular low back pain Uncontrolled Unable to undergo PT as she states she would be required to pay for physical therapy services Discussed benefits of yoga, aquatic therapy, massage and she has been advised to look into her local [employer] as her insurance might cover some water aerobics - acetaminophen-codeine (TYLENOL #3) 300-30 MG tablet; TAKE 1 TABLET BY MOUTH EVERY 12  HOURS AS NEEDED FOR MODERATE PAIN  Dispense: 60 tablet; Refill: 2 - Drug Screen 12+Alcohol+CRT, Ur  5. Screening for iron deficiency anemia - CBC with Differential/Platelet    Meds ordered this encounter  Medications  . dapagliflozin propanediol (FARXIGA) 5 MG TABS tablet    Sig: Take 1 tablet (5  mg total) by mouth daily before breakfast.    Dispense:  30 tablet    Refill:  6    Discontinue Metformin  . acetaminophen-codeine (TYLENOL #3) 300-30 MG tablet    Sig: TAKE 1 TABLET BY MOUTH EVERY 12 HOURS AS NEEDED FOR MODERATE PAIN    Dispense:  60 tablet    Refill:  2    Follow-up: Return in about 6 months (around 03/27/2021) for Disease management.       Charlott Rakes, MD, FAAFP. [facility], [place]   09/25/2020, 5:30 PM

## 2020-09-25 NOTE — Progress Notes (Signed)
Wants to discuss metformin. Wants to get all lab work done(she is fasting)

## 2020-09-26 LAB — LIPID PANEL
Chol/HDL Ratio: 4.4 ratio (ref 0.0–4.4)
Cholesterol, Total: 205 mg/dL — ABNORMAL HIGH (ref 100–199)
HDL: 47 mg/dL (ref >39)
LDL Chol Calc (NIH): 140 mg/dL — ABNORMAL HIGH (ref 0–99)
Triglycerides: 98 mg/dL (ref 0–149)
VLDL Cholesterol Cal: 18 mg/dL (ref 5–40)

## 2020-09-26 LAB — CBC WITH DIFFERENTIAL/PLATELET
Basophils Absolute: 0.1 10*3/uL (ref 0.0–0.2)
Basos: 1 %
EOS (ABSOLUTE): 0.3 10*3/uL (ref 0.0–0.4)
Eos: 5 %
Hematocrit: 42.1 % (ref 34.0–46.6)
Hemoglobin: 14.2 g/dL (ref 11.1–15.9)
Immature Grans (Abs): 0 10*3/uL (ref 0.0–0.1)
Immature Granulocytes: 0 %
Lymphocytes Absolute: 2.7 10*3/uL (ref 0.7–3.1)
Lymphs: 45 %
MCH: 30 pg (ref 26.6–33.0)
MCHC: 33.7 g/dL (ref 31.5–35.7)
MCV: 89 fL (ref 79–97)
Monocytes Absolute: 0.5 10*3/uL (ref 0.1–0.9)
Monocytes: 8 %
Neutrophils Absolute: 2.4 10*3/uL (ref 1.4–7.0)
Neutrophils: 41 %
Platelets: 273 10*3/uL (ref 150–450)
RBC: 4.74 x10E6/uL (ref 3.77–5.28)
RDW: 11.8 % (ref 11.7–15.4)
WBC: 5.8 10*3/uL (ref 3.4–10.8)

## 2020-09-26 LAB — CMP14+EGFR
ALT: 37 IU/L — ABNORMAL HIGH (ref 0–32)
AST: 32 IU/L (ref 0–40)
Albumin/Globulin Ratio: 1.7 (ref 1.2–2.2)
Albumin: 4.5 g/dL (ref 3.8–4.9)
Alkaline Phosphatase: 67 IU/L (ref 44–121)
BUN/Creatinine Ratio: 16 (ref 9–23)
BUN: 10 mg/dL (ref 6–24)
Bilirubin Total: 1.4 mg/dL — ABNORMAL HIGH (ref 0.0–1.2)
CO2: 22 mmol/L (ref 20–29)
Calcium: 9.5 mg/dL (ref 8.7–10.2)
Chloride: 103 mmol/L (ref 96–106)
Creatinine, Ser: 0.64 mg/dL (ref 0.57–1.00)
Globulin, Total: 2.7 g/dL (ref 1.5–4.5)
Glucose: 96 mg/dL (ref 65–99)
Potassium: 4.3 mmol/L (ref 3.5–5.2)
Sodium: 140 mmol/L (ref 134–144)
Total Protein: 7.2 g/dL (ref 6.0–8.5)
eGFR: 104 mL/min/{1.73_m2} (ref >59)

## 2020-09-26 LAB — MICROALBUMIN / CREATININE URINE RATIO
Creatinine, Urine: 145.4 mg/dL
Microalb/Creat Ratio: 4 mg/g creat (ref 0–29)
Microalbumin, Urine: 5.5 ug/mL

## 2020-09-26 LAB — HIV ANTIBODY (ROUTINE TESTING W REFLEX): HIV Screen 4th Generation wRfx: NONREACTIVE

## 2020-10-10 LAB — DRUG SCREEN 12+ALCOHOL+CRT, UR
Amphetamines, Urine: NEGATIVE ng/mL
BENZODIAZ UR QL: NEGATIVE ng/mL
Barbiturate: NEGATIVE ng/mL
Cannabinoids: NEGATIVE ng/mL
Cocaine (Metabolite): NEGATIVE ng/mL
Creatinine, Urine: 161.7 mg/dL (ref 20.0–300.0)
Ethanol, Urine: NEGATIVE %
Meperidine: NEGATIVE ng/mL
Methadone: NEGATIVE ng/mL
OPIATE SCREEN URINE: POSITIVE ng/mL — AB
Oxycodone/Oxymorphone, Urine: NEGATIVE ng/mL
Phencyclidine: NEGATIVE ng/mL
Propoxyphene: NEGATIVE ng/mL
Tramadol: NEGATIVE ng/mL

## 2021-01-26 ENCOUNTER — Telehealth: Payer: Self-pay

## 2021-01-26 NOTE — Telephone Encounter (Signed)
LMOM for patient to ret call to schedule AWV. drl

## 2021-01-30 NOTE — Telephone Encounter (Signed)
Pt again has returned call for Alexis Frock at [phone]. Will have phone with her.

## 2021-02-04 ENCOUNTER — Other Ambulatory Visit: Payer: Self-pay | Admitting: Family Medicine

## 2021-02-04 DIAGNOSIS — E1169 Type 2 diabetes mellitus with other specified complication: Secondary | ICD-10-CM

## 2021-02-12 ENCOUNTER — Encounter: Payer: Self-pay | Admitting: Family Medicine

## 2021-02-12 ENCOUNTER — Ambulatory Visit: Payer: Medicare HMO | Attending: Family Medicine | Admitting: Family Medicine

## 2021-02-12 ENCOUNTER — Other Ambulatory Visit: Payer: Self-pay

## 2021-02-12 VITALS — BP 127/80 | HR 63 | Resp 16 | Wt 173.4 lb

## 2021-02-12 DIAGNOSIS — Z0001 Encounter for general adult medical examination with abnormal findings: Secondary | ICD-10-CM

## 2021-02-12 DIAGNOSIS — M5416 Radiculopathy, lumbar region: Secondary | ICD-10-CM | POA: Diagnosis not present

## 2021-02-12 DIAGNOSIS — E1169 Type 2 diabetes mellitus with other specified complication: Secondary | ICD-10-CM

## 2021-02-12 DIAGNOSIS — H524 Presbyopia: Secondary | ICD-10-CM | POA: Diagnosis not present

## 2021-02-12 DIAGNOSIS — M791 Myalgia, unspecified site: Secondary | ICD-10-CM

## 2021-02-12 DIAGNOSIS — E785 Hyperlipidemia, unspecified: Secondary | ICD-10-CM | POA: Diagnosis not present

## 2021-02-12 DIAGNOSIS — G8929 Other chronic pain: Secondary | ICD-10-CM

## 2021-02-12 DIAGNOSIS — I8392 Asymptomatic varicose veins of left lower extremity: Secondary | ICD-10-CM

## 2021-02-12 DIAGNOSIS — Z0000 Encounter for general adult medical examination without abnormal findings: Secondary | ICD-10-CM

## 2021-02-12 DIAGNOSIS — Z1231 Encounter for screening mammogram for malignant neoplasm of breast: Secondary | ICD-10-CM

## 2021-02-12 NOTE — Progress Notes (Signed)
Subjective:   Jill Anderson is a 55 y.o. female who presents for Medicare Annual (Subsequent) preventive examination.  Crestor appears on her med list but she has not been taking it and is taking Red Yeast Rice instead. I had referred her to Cardiology for consideration of the PCSK9 inhibitors which she declined. Her lower back continues to hurt and this time around it radiates down her left lower extremity and she has noticed development of varicose vein in her left calf which are not painful. Compliant with [facility] for diabetes.  Review of Systems    General: negative for fever, weight loss, appetite change Eyes: no visual symptoms. ENT: no ear symptoms, no sinus tenderness, no nasal congestion or sore throat. Neck: no pain  Respiratory: no wheezing, shortness of breath, cough Cardiovascular: no chest pain, no dyspnea on exertion, no pedal edema, no orthopnea. Gastrointestinal: no abdominal pain, no diarrhea, no constipation Genito-Urinary: no urinary frequency, no dysuria, no polyuria. Hematologic: no bruising Endocrine: no cold or heat intolerance Neurological: no headaches, no seizures, no tremors Musculoskeletal: Positive for low back pain with radiation down her left lower extremity Skin: no pruritus, no rash. Psychological: no depression, no anxiety,         Objective:    Today's Vitals   02/12/21 0957 02/12/21 1000  BP: 127/80   Pulse: 63   Resp: 16   SpO2: 99%   Weight: 173 lb 6.4 oz (78.7 kg)   PainSc:  7    Body mass index is 31.72 kg/m.  Advanced Directives 02/12/2021 04/07/2018 03/15/2018 11/04/2017 01/04/2017 12/14/2016 10/27/2016  Does Patient Have a Medical Advance Directive? No No No No No No No  Would patient like information on creating a medical advance directive? No - Patient declined No - Patient declined No - Patient declined Yes (MAU/Ambulatory/Procedural Areas - Information given) - - -    Current Medications (verified) Outpatient Encounter  Medications as of 02/12/2021  Medication Sig   Accu-Chek FastClix Lancets MISC USE AS DIRECTED to check blood sugar once daily.   acetaminophen-codeine (TYLENOL #3) 300-30 MG tablet TAKE 1 TABLET BY MOUTH EVERY 12 HOURS AS NEEDED FOR MODERATE PAIN   aspirin EC 81 MG tablet Take 1 tablet (81 mg total) by mouth daily.   azelastine (ASTELIN) 0.1 % nasal spray Place 2 sprays into both nostrils 2 (two) times daily.   Blood Glucose Monitoring Suppl (ACCU-CHEK GUIDE ME) w/Device KIT 1 each by Does not apply route daily.   cetirizine (ZYRTEC ALLERGY) 10 MG tablet Take 1 tablet (10 mg total) by mouth daily.   dapagliflozin propanediol (FARXIGA) 5 MG TABS tablet Take 1 tablet (5 mg total) by mouth daily before breakfast.   diclofenac Sodium (VOLTAREN) 1 % GEL Apply 4 g topically 4 (four) times daily.   Elastic Bandages & Supports (TRUFORM ARM 46 M 15-20MMHG) MISC 1 each by Does not apply route daily.   erythromycin ophthalmic ointment Place 1 application into the left eye every 6 (six) hours. Place 1/2 inch ribbon of ointment in the affected eye 4 times a day for 5 days   famotidine (PEPCID) 20 MG tablet Take 1 tablet (20 mg total) by mouth 2 (two) times daily.   fluticasone (FLONASE) 50 MCG/ACT nasal spray Place 2 sprays into both nostrils daily.   glucose blood (ACCU-CHEK GUIDE) test strip USE TO CHECK BLOOD SUGAR ONCE DAILY   hydrocortisone (ANUSOL-HC) 25 MG suppository Place 1 suppository (25 mg total) rectally 2 (two) times daily.   ibuprofen (  ADVIL) 800 MG tablet TAKE 1 TABLET BY MOUTH EVERY 12 HOURS AS NEEDED   Misc. Devices (CANE) MISC 1 each by Does not apply route as needed.   psyllium (METAMUCIL) 58.6 % powder Take 1 packet by mouth 2 (two) times daily.   rosuvastatin (CRESTOR) 5 MG tablet Take 1 tablet (5 mg total) by mouth daily.   No facility-administered encounter medications on file as of 02/12/2021.    Allergies (verified) Atorvastatin   History: Past Medical History:  Diagnosis  Date   Borderline diabetic    Chronic back pain    Diabetes mellitus without complication ([place])    Hypercholesteremia    Past Surgical History:  Procedure Laterality Date   ABDOMINAL HYSTERECTOMY     BACK SURGERY     CYSTOSCOPY WITH RETROGRADE PYELOGRAM, URETEROSCOPY AND STENT PLACEMENT Left 10/28/2015   Procedure: CYSTOSCOPY WITH RETROGRADE PYELOGRAM, URETEROSCOPY AND STENT PLACEMENT, AND LASER;  Surgeon: Irine Seal, MD;  Location: [facility];  Service: Urology;  Laterality: Left;   Family History  Problem Relation Age of Onset   Diabetes Mother    Hypertension Mother    Heart disease Mother        based at age 71 of MI   Diabetes Father    Hypertension Father    Heart disease Father    Cancer Maternal Aunt        liver   Social History   Socioeconomic History   Marital status: Single    Spouse name: Not on file   Number of children: 3   Years of education: Not on file   Highest education level: Not on file  Occupational History   Occupation: Housewife  Tobacco Use   Smoking status: Never   Smokeless tobacco: Never  Vaping Use   Vaping Use: Never used  Substance and Sexual Activity   Alcohol use: No   Drug use: No   Sexual activity: Not Currently  Other Topics Concern   Not on file  Social History Narrative   Not on file   Social Determinants of Health   Financial Resource Strain: Not on file  Food Insecurity: Not on file  Transportation Needs: Not on file  Physical Activity: Not on file  Stress: Not on file  Social Connections: Not on file    Tobacco Counseling Counseling given: Not Answered   Clinical Intake:     Pain : 0-10 Pain Score: 7  Pain Location: Leg     Diabetes: Yes     Diabetic?Yes         Activities of Daily Living In your present state of health, do you have any difficulty performing the following activities: 02/12/2021  Hearing? N  Vision? N  Difficulty concentrating or making decisions? Y  Walking or climbing stairs?  Y  Dressing or bathing? N  Doing errands, shopping? N  Preparing Food and eating ? Y  Using the Toilet? N  In the past six months, have you accidently leaked urine? Y  Do you have problems with loss of bowel control? N  Managing your Medications? N  Managing your Finances? N  Housekeeping or managing your Housekeeping? Y  Some recent data might be hidden    Patient Care Team: Charlott Rakes, MD as PCP - General (Family Medicine)  Indicate any recent Medical Services you may have received from other than [facility] providers in the past year (date may be approximate).     Assessment:   This is a routine wellness examination for  Jill Anderson.  Hearing/Vision screen No results found.  Dietary issues and exercise activities discussed:     Goals Addressed   None    Depression Screen PHQ 2/9 Scores 02/12/2021 09/25/2020 12/19/2019 06/21/2019 06/13/2018 03/15/2018 12/13/2017  PHQ - 2 Score 1 1 1 1 1 2  0  PHQ- 9 Score - 3 3 4 6 7  -    Fall Risk Fall Risk  02/12/2021 09/25/2020 04/16/2020 12/19/2019 09/19/2019  Falls in the past year? 0 1 0 0 0  Number falls in past yr: 0 0 0 - -  Injury with Fall? 0 0 - - -  Risk for fall due to : No Fall Risks - - No Fall Risks -    FALL RISK PREVENTION PERTAINING TO THE HOME:  Any stairs in or around the home? No  If so, are there any without handrails? No  Home free of loose throw rugs in walkways, pet beds, electrical cords, etc? Yes  Adequate lighting in your home to reduce risk of falls? Yes   ASSISTIVE DEVICES UTILIZED TO PREVENT FALLS:  Life alert? No  Use of a cane, walker or w/c? Yes  Grab bars in the bathroom? No  Shower chair or bench in shower? No  Elevated toilet seat or a handicapped toilet? No   TIMED UP AND GO:  Was the test performed? No .  Length of time to ambulate 10 feet: n/a sec.   Gait slow and steady with assistive device  Cognitive Function:    Forgets things; she has a family history of Alzheimer's     Immunizations Immunization History  Administered Date(s) Administered   PFIZER(Purple Top)SARS-COV-2 Vaccination 09/07/2019, 10/02/2019   Pneumococcal Polysaccharide-23 07/12/2015    TDAP status: Up to date  Flu Vaccine status: Declined, Education has been provided regarding the importance of this vaccine but patient still declined. Advised may receive this vaccine at local pharmacy or Health Dept. Aware to provide a copy of the vaccination record if obtained from local pharmacy or Health Dept. Verbalized acceptance and understanding.   Qualifies for Shingles Vaccine? Yes   Zostavax completed No   Shingrix Completed?: No.    Education has been provided regarding the importance of this vaccine. Patient has been advised to call insurance company to determine out of pocket expense if they have not yet received this vaccine. Advised may also receive vaccine at local pharmacy or Health Dept. Verbalized acceptance and understanding.  Screening Tests Health Maintenance  Topic Date Due   Zoster Vaccines- Shingrix (1 of 2) Never done   OPHTHALMOLOGY EXAM  12/16/2018   COVID-19 Vaccine (3 - Booster for Pfizer series) 03/03/2020   FOOT EXAM  12/18/2020   INFLUENZA VACCINE  Never done   HEMOGLOBIN A1C  03/27/2021   COLONOSCOPY (Pts 45-81yr Insurance coverage will need to be confirmed)  06/01/2021   URINE MICROALBUMIN  09/25/2021   MAMMOGRAM  11/22/2021   TETANUS/TDAP  09/30/2026   PNEUMOCOCCAL POLYSACCHARIDE VACCINE AGE 72-64 HIGH RISK  Completed   Hepatitis C Screening  Completed   HIV Screening  Completed   Pneumococcal Vaccine [id number] old  Aged Out   [place]PV [place]  Topic Date Due   Zoster Vaccines- Shingrix (1 of 2) Never done   OPHTHALMOLOGY EXAM  12/16/2018   COVID-19 Vaccine (3 - Booster for Pfizer series) 03/03/2020   FOOT EXAM  12/18/2020   INFLUENZA VACCINE  Never done    Colorectal  cancer screening: Type of  screening: Colonoscopy. Completed 06/2011. Repeat every 10 years  Mammogram status: Ordered today. Pt provided with contact info and advised to call to schedule appt.    Additional Screening:  Hepatitis C Screening: does qualify; Completed 07/12/15  Vision Screening: Recommended annual ophthalmology exams for early detection of glaucoma and other disorders of the eye. Is the patient up to date with their annual eye exam?  Yes  Who is the provider or what is the name of the office in which the patient attends annual eye exams? unknown If pt is not established with a provider, would they like to be referred to a provider to establish care?  She has a Secondary school teacher .   Dental Screening: Recommended annual dental exams for proper oral hygiene  Community Resource Referral / Chronic Care Management: CRR required this visit?  No   CCM required this visit?  No      Plan:   1. Encounter for Medicare annual wellness exam Counseled on 150 minutes of exercise per week, healthy eating (including decreased daily intake of saturated fats, cholesterol, added sugars, sodium), routine healthcare maintenance.   2. Type 2 diabetes mellitus with other specified complication, without long-term current use of insulin (HCC) Controlled with A1c of 7.1 Continue current management - CMP14+EGFR - Hemoglobin A1c - Lipid panel  3. Chronic radicular low back pain Uncontrolled PT was ineffective in the past She declined surgical intervention Currently on Tylenol 3 Advised to apply heat or ice whichever is tolerated to painful areas. Counseled on evidence of improvement in pain control with regards to yoga, water aerobics, massage, home physical therapy, exercise as tolerated.   4. Encounter for screening mammogram for malignant neoplasm of breast - MM 3D SCREEN BREAST BILATERAL; Future  5. Asymptomatic varicose veins of left lower extremity Advised to use compression stocking  6. Hyperlipidemia associated  with type 2 diabetes mellitus ([place]) Uncontrolled Statin intolerance Not taking Crestor even though this was prescribed by cardiology We will check lipid panel Low-cholesterol diet  7. myalgia due to statin. Declines PCSK9 inhibitor  I have personally reviewed and noted the following in the patient's chart:   Medical and social history Use of alcohol, tobacco or illicit drugs  Current medications and supplements including opioid prescriptions.  Functional ability and status Nutritional status Physical activity Advanced directives List of other physicians Hospitalizations, surgeries, and ER visits in previous 12 months Vitals Screenings to include cognitive, depression, and falls Referrals and appointments  In addition, I have reviewed and discussed with patient certain preventive protocols, quality metrics, and best practice recommendations. A written personalized care plan for preventive services as well as general preventive health recommendations were provided to patient.     Charlott Rakes, MD   02/12/2021

## 2021-02-12 NOTE — Patient Instructions (Signed)
  Ms. Stivers , Thank you for taking time to come for your Medicare Wellness Visit. I appreciate your ongoing commitment to your health goals. Please review the following plan we discussed and let me know if I can assist you in the future.   These are the goals we discussed:  Goals      HEMOGLOBIN A1C < 7.0        This is a list of the screening recommended for you and due dates:  Health Maintenance  Topic Date Due   Zoster (Shingles) Vaccine (1 of 2) Never done   Eye exam for diabetics  12/16/2018   COVID-19 Vaccine (3 - Booster for Pfizer series) 03/03/2020   Flu Shot  08/29/2021*   Hemoglobin A1C  03/27/2021   Colon Cancer Screening  06/01/2021   Urine Protein Check  09/25/2021   Mammogram  11/22/2021   Complete foot exam   02/12/2022   Tetanus Vaccine  09/30/2026   Pneumococcal vaccine  Completed   Hepatitis C Screening: USPSTF Recommendation to screen - Ages 18-79 yo.  Completed   HIV Screening  Completed   Pneumococcal Vaccination  Aged Out   HPV Vaccine  Aged Out  *Topic was postponed. The date shown is not the original due date.

## 2021-02-13 ENCOUNTER — Other Ambulatory Visit: Payer: Self-pay | Admitting: Family Medicine

## 2021-02-13 LAB — CMP14+EGFR
ALT: 33 IU/L — ABNORMAL HIGH (ref 0–32)
AST: 26 IU/L (ref 0–40)
Albumin/Globulin Ratio: 1.6 (ref 1.2–2.2)
Albumin: 4.6 g/dL (ref 3.8–4.9)
Alkaline Phosphatase: 71 IU/L (ref 44–121)
BUN/Creatinine Ratio: 17 (ref 9–23)
BUN: 13 mg/dL (ref 6–24)
Bilirubin Total: 0.5 mg/dL (ref 0.0–1.2)
CO2: 24 mmol/L (ref 20–29)
Calcium: 9.5 mg/dL (ref 8.7–10.2)
Chloride: 101 mmol/L (ref 96–106)
Creatinine, Ser: 0.75 mg/dL (ref 0.57–1.00)
Globulin, Total: 2.8 g/dL (ref 1.5–4.5)
Glucose: 164 mg/dL — ABNORMAL HIGH (ref 65–99)
Potassium: 4.8 mmol/L (ref 3.5–5.2)
Sodium: 139 mmol/L (ref 134–144)
Total Protein: 7.4 g/dL (ref 6.0–8.5)
eGFR: 93 mL/min/{1.73_m2} (ref >59)

## 2021-02-13 LAB — LIPID PANEL
Chol/HDL Ratio: 4.8 ratio — ABNORMAL HIGH (ref 0.0–4.4)
Cholesterol, Total: 230 mg/dL — ABNORMAL HIGH (ref 100–199)
HDL: 48 mg/dL (ref >39)
LDL Chol Calc (NIH): 159 mg/dL — ABNORMAL HIGH (ref 0–99)
Triglycerides: 125 mg/dL (ref 0–149)
VLDL Cholesterol Cal: 23 mg/dL (ref 5–40)

## 2021-02-13 LAB — HEMOGLOBIN A1C
Est. average glucose Bld gHb Est-mCnc: 174 mg/dL
Hgb A1c MFr Bld: 7.7 % — ABNORMAL HIGH (ref 4.8–5.6)

## 2021-02-13 MED ORDER — DAPAGLIFLOZIN PROPANEDIOL 10 MG PO TABS
10.0000 mg | ORAL_TABLET | Freq: Every day | ORAL | 1 refills | Status: DC
Start: 2021-02-13 — End: 2021-10-01

## 2021-03-20 ENCOUNTER — Other Ambulatory Visit: Payer: Self-pay

## 2021-03-20 ENCOUNTER — Ambulatory Visit
Admission: RE | Admit: 2021-03-20 | Discharge: 2021-03-20 | Disposition: A | Discharge status: Home / Self Care | Payer: Medicare HMO | Source: Ambulatory Visit | Attending: Family Medicine | Admitting: Family Medicine

## 2021-03-20 DIAGNOSIS — Z1231 Encounter for screening mammogram for malignant neoplasm of breast: Secondary | ICD-10-CM

## 2021-04-17 ENCOUNTER — Emergency Department (HOSPITAL_COMMUNITY)
Admission: EM | Admit: 2021-04-17 | Discharge: 2021-04-18 | Disposition: A | Discharge status: Home / Self Care | Payer: Medicare HMO | Attending: Emergency Medicine | Admitting: Emergency Medicine

## 2021-04-17 DIAGNOSIS — Z79899 Other long term (current) drug therapy: Secondary | ICD-10-CM | POA: Insufficient documentation

## 2021-04-17 DIAGNOSIS — Z7982 Long term (current) use of aspirin: Secondary | ICD-10-CM | POA: Diagnosis not present

## 2021-04-17 DIAGNOSIS — R079 Chest pain, unspecified: Secondary | ICD-10-CM | POA: Diagnosis not present

## 2021-04-17 DIAGNOSIS — M549 Dorsalgia, unspecified: Secondary | ICD-10-CM

## 2021-04-17 DIAGNOSIS — R519 Headache, unspecified: Secondary | ICD-10-CM | POA: Insufficient documentation

## 2021-04-17 DIAGNOSIS — Z7984 Long term (current) use of oral hypoglycemic drugs: Secondary | ICD-10-CM | POA: Diagnosis not present

## 2021-04-17 DIAGNOSIS — M546 Pain in thoracic spine: Secondary | ICD-10-CM | POA: Diagnosis not present

## 2021-04-17 DIAGNOSIS — I10 Essential (primary) hypertension: Secondary | ICD-10-CM | POA: Insufficient documentation

## 2021-04-17 DIAGNOSIS — E119 Type 2 diabetes mellitus without complications: Secondary | ICD-10-CM | POA: Diagnosis not present

## 2021-04-17 DIAGNOSIS — R0602 Shortness of breath: Secondary | ICD-10-CM | POA: Insufficient documentation

## 2021-04-17 DIAGNOSIS — R0789 Other chest pain: Secondary | ICD-10-CM | POA: Diagnosis not present

## 2021-04-18 ENCOUNTER — Other Ambulatory Visit: Payer: Self-pay

## 2021-04-18 ENCOUNTER — Encounter (HOSPITAL_COMMUNITY): Payer: Self-pay | Admitting: Emergency Medicine

## 2021-04-18 ENCOUNTER — Ambulatory Visit: Payer: Self-pay

## 2021-04-18 ENCOUNTER — Emergency Department (HOSPITAL_COMMUNITY): Payer: Medicare HMO

## 2021-04-18 DIAGNOSIS — R079 Chest pain, unspecified: Secondary | ICD-10-CM | POA: Diagnosis not present

## 2021-04-18 DIAGNOSIS — R0602 Shortness of breath: Secondary | ICD-10-CM | POA: Diagnosis not present

## 2021-04-18 LAB — CBC WITH DIFFERENTIAL/PLATELET
Abs Immature Granulocytes: 0.02 10*3/uL (ref 0.00–0.07)
Basophils Absolute: 0.1 10*3/uL (ref 0.0–0.1)
Basophils Relative: 1 %
Eosinophils Absolute: 0.4 10*3/uL (ref 0.0–0.5)
Eosinophils Relative: 5 %
HCT: 41.5 % (ref 36.0–46.0)
Hemoglobin: 13.9 g/dL (ref 12.0–15.0)
Immature Granulocytes: 0 %
Lymphocytes Relative: 47 %
Lymphs Abs: 3.9 10*3/uL (ref 0.7–4.0)
MCH: 30.5 pg (ref 26.0–34.0)
MCHC: 33.5 g/dL (ref 30.0–36.0)
MCV: 91 fL (ref 80.0–100.0)
Monocytes Absolute: 0.5 10*3/uL (ref 0.1–1.0)
Monocytes Relative: 6 %
Neutro Abs: 3.3 10*3/uL (ref 1.7–7.7)
Neutrophils Relative %: 41 %
Platelets: 289 10*3/uL (ref 150–400)
RBC: 4.56 MIL/uL (ref 3.87–5.11)
RDW: 11.6 % (ref 11.5–15.5)
WBC: 8.2 10*3/uL (ref 4.0–10.5)
nRBC: 0 % (ref 0.0–0.2)

## 2021-04-18 LAB — BASIC METABOLIC PANEL
Anion gap: 9 (ref 5–15)
BUN: 12 mg/dL (ref 6–20)
CO2: 25 mmol/L (ref 22–32)
Calcium: 9.2 mg/dL (ref 8.9–10.3)
Chloride: 101 mmol/L (ref 98–111)
Creatinine, Ser: 0.73 mg/dL (ref 0.44–1.00)
GFR, Estimated: >60 mL/min (ref >60)
Glucose, Bld: 211 mg/dL — ABNORMAL HIGH (ref 70–99)
Potassium: 3.5 mmol/L (ref 3.5–5.1)
Sodium: 135 mmol/L (ref 135–145)

## 2021-04-18 LAB — TROPONIN I (HIGH SENSITIVITY)
Troponin I (High Sensitivity): 4 ng/L (ref <18)
Troponin I (High Sensitivity): 3 ng/L (ref <18)

## 2021-04-18 LAB — D-DIMER, QUANTITATIVE: D-Dimer, Quant: <0.27 ug/mL-FEU (ref 0.00–0.50)

## 2021-04-18 LAB — BRAIN NATRIURETIC PEPTIDE: B Natriuretic Peptide: 5.9 pg/mL (ref 0.0–100.0)

## 2021-04-18 MED ORDER — METHOCARBAMOL 500 MG PO TABS: 500.0000 mg | ORAL_TABLET | Freq: Two times a day (BID) | ORAL | 0 refills | Status: DC | PRN

## 2021-04-18 NOTE — Telephone Encounter (Signed)
Pt. Wants her PCP to know she is currently in the ED with chest pain. Has been there since last night. Hurts on left side and into back, neck and jaw. Hurts with movement. Instructed pt. To stay and complete her work up. Verbalizes understanding.    Answer Assessment - Initial Assessment Questions 1. LOCATION: "Where does it hurt?"       Left side 2. RADIATION: "Does the pain go anywhere else?" (e.g., into neck, jaw, arms, back)     Back and jaw 3. ONSET: "When did the chest pain begin?" (Minutes, hours or days)      Yesterday 4. PATTERN "Does the pain come and go, or has it been constant since it started?"  "Does it get worse with exertion?"      Constant 5. DURATION: "How long does it last" (e.g., seconds, minutes, hours)     Hours 6. SEVERITY: "How bad is the pain?"  (e.g., Scale 1-10; mild, moderate, or severe)    - MILD (1-3): doesn't interfere with normal activities     - MODERATE (4-7): interferes with normal activities or awakens from sleep    - SEVERE (8-10): excruciating pain, unable to do any normal activities       8 7. CARDIAC RISK FACTORS: "Do you have any history of heart problems or risk factors for heart disease?" (e.g., angina, prior heart attack; diabetes, high blood pressure, high cholesterol, smoker, or strong family history of heart disease)     No 8. PULMONARY RISK FACTORS: "Do you have any history of lung disease?"  (e.g., blood clots in lung, asthma, emphysema, birth control pills)     Blood clots 9. CAUSE: "What do you think is causing the chest pain?"     Unsure 10. OTHER SYMPTOMS: "Do you have any other symptoms?" (e.g., dizziness, nausea, vomiting, sweating, fever, difficulty breathing, cough)       Headache 11. PREGNANCY: "Is there any chance you are pregnant?" "When was your last menstrual period?"       No  Protocols used: Chest Pain-A-AH

## 2021-04-18 NOTE — ED Provider Notes (Signed)
Emergency Medicine Provider Triage Evaluation Note  Jill Anderson , a 55 y.o. female  was evaluated in triage.  Pt complains of upper back and chest pain.  Back pain started yesterday, tonight seems more like chest pain.  Worse along left side, pain increases with breathing.  Reports hx of blood clots in the past, takes daily ASA but no formal anticoagulation.  Review of Systems  Positive: Chest pain, back pain  Negative: Cough, fever  Physical Exam  BP (!) 152/78 (BP Location: Left Arm)   Pulse 78   Temp 98 F (36.7 C) (Oral)   Resp 16   SpO2 97%  Gen:   Awake, no distress   Resp:  Normal effort  MSK:   Moves extremities without difficulty  Other:    Medical Decision Making  Medically screening exam initiated at 12:03 AM.  Appropriate orders placed.  Jill Anderson was informed that the remainder of the evaluation will be completed by another provider, this initial triage assessment does not replace that evaluation, and the importance of remaining in the ED until their evaluation is complete.  Chest and upper back pain.  Seems pleuritic with increased pain w/breathing.  Hx of clots in the past per her report.  EKG, labs including d-dimer, CXR.   Garlon Hatchet, PA-C 04/18/21 0005    Glynn Octave, MD 04/18/21 [phone]

## 2021-04-18 NOTE — ED Provider Notes (Signed)
[facility] EMERGENCY DEPARTMENT Provider Note   CSN: [id number] Arrival date & time: 04/17/21  2340     History Chief Complaint  Patient presents with   Chest Pain / Back Pain     Jill Anderson is a 55 y.o. female.  She is here with a complaint of left-sided upper back neck and chest pain that started he has today.  Worse with movement bending twisting.  No trauma.  Tried some Biofreeze without any improvement.  No prior cardiac history.  Is a diabetic.  Non-smoker  The history is provided by the patient.  Chest Pain Pain location:  L chest Pain quality: aching   Pain radiates to:  Upper back, L arm and neck Pain severity:  Severe Onset quality:  Sudden Duration:  2 days Timing:  Constant Progression:  Unchanged Chronicity:  New Context: at rest   Relieved by:  Nothing Worsened by:  Movement Ineffective treatments:  Rest Associated symptoms: back pain and headache   Associated symptoms: no abdominal pain, no altered mental status, no cough, no diaphoresis, no fever, no nausea, no shortness of breath and no vomiting   Risk factors: diabetes mellitus and hypertension   Risk factors: no smoking       Past Medical History:  Diagnosis Date   Borderline diabetic    Chronic back pain    Diabetes mellitus without complication ([place])    Hypercholesteremia     Patient Active Problem List   Diagnosis Date Noted   Myalgia due to statin 12/12/2018   Vitamin D deficiency 09/21/2017   Insomnia 05/14/2017   Menopausal symptoms 01/04/2017   Urticaria 12/14/2016   Pain in both hands 12/14/2016   Hypertension 10/27/2016   Bilateral anterior knee pain 03/20/2016   Left ureteral stone 10/27/2015   New onset of headaches after age 48 10/07/2015   Hyperlipidemia associated with type 2 diabetes mellitus ([place]) 07/15/2015   DM2 (diabetes mellitus, type 2) ([place]) 03/04/2015   Chronic neck pain 09/11/2014   Cervical disc disorder with radiculopathy of cervical region  09/11/2014   Chronic radicular low back pain 09/11/2014    Past Surgical History:  Procedure Laterality Date   ABDOMINAL HYSTERECTOMY     BACK SURGERY     CYSTOSCOPY WITH RETROGRADE PYELOGRAM, URETEROSCOPY AND STENT PLACEMENT Left 10/28/2015   Procedure: CYSTOSCOPY WITH RETROGRADE PYELOGRAM, URETEROSCOPY AND STENT PLACEMENT, AND LASER;  Surgeon: Irine Seal, MD;  Location: [facility];  Service: Urology;  Laterality: Left;     OB History   No obstetric history on file.     Family History  Problem Relation Age of Onset   Diabetes Mother    Hypertension Mother    Heart disease Mother        based at age 66 of MI   Diabetes Father    Hypertension Father    Heart disease Father    Cancer Maternal Aunt        liver    Social History   Tobacco Use   Smoking status: Never   Smokeless tobacco: Never  Vaping Use   Vaping Use: Never used  Substance Use Topics   Alcohol use: No   Drug use: No    Home Medications Prior to Admission medications   Medication Sig Start Date End Date Taking? Authorizing Provider  Accu-Chek FastClix Lancets MISC USE AS DIRECTED to check blood sugar once daily. 12/22/19   Charlott Rakes, MD  acetaminophen-codeine (TYLENOL #3) 300-30 MG tablet TAKE 1 TABLET BY  MOUTH EVERY 12 HOURS AS NEEDED FOR MODERATE PAIN 09/25/20   Charlott Rakes, MD  aspirin EC 81 MG tablet Take 1 tablet (81 mg total) by mouth daily. 09/29/16   Funches, Adriana Mccallum, MD  azelastine (ASTELIN) 0.1 % nasal spray Place 2 sprays into both nostrils 2 (two) times daily. 09/29/19   Tasia Catchings, Amy V, PA-C  Blood Glucose Monitoring Suppl (ACCU-CHEK GUIDE ME) w/Device KIT 1 each by Does not apply route daily. 06/13/18   Charlott Rakes, MD  cetirizine (ZYRTEC ALLERGY) 10 MG tablet Take 1 tablet (10 mg total) by mouth daily. 10/26/19   Hall-Potvin, [place], PA-C  dapagliflozin propanediol (FARXIGA) 10 MG TABS tablet Take 1 tablet (10 mg total) by mouth daily before breakfast. 02/13/21   Charlott Rakes, MD   diclofenac Sodium (VOLTAREN) 1 % GEL Apply 4 g topically 4 (four) times daily. 04/16/20   Charlott Rakes, MD  Elastic Bandages & Supports ([place] M 15-20MMHG) MISC 1 each by Does not apply route daily. 03/20/16   Funches, Adriana Mccallum, MD  erythromycin ophthalmic ointment Place 1 application into the left eye every 6 (six) hours. Place 1/2 inch ribbon of ointment in the affected eye 4 times a day for 5 days 02/18/19   Margarita Mail, PA-C  famotidine (PEPCID) 20 MG tablet Take 1 tablet (20 mg total) by mouth 2 (two) times daily. 11/21/18   Hedges, Dellis Filbert, PA-C  fluticasone (FLONASE) 50 MCG/ACT nasal spray Place 2 sprays into both nostrils daily. 04/16/20   Charlott Rakes, MD  glucose blood (ACCU-CHEK GUIDE) test strip USE TO CHECK BLOOD SUGAR ONCE DAILY 02/04/21   Charlott Rakes, MD  hydrocortisone (ANUSOL-HC) 25 MG suppository Place 1 suppository (25 mg total) rectally 2 (two) times daily. 03/19/20   Hall-Potvin, [place], PA-C  ibuprofen (ADVIL) 800 MG tablet TAKE 1 TABLET BY MOUTH EVERY 12 HOURS AS NEEDED 06/13/20   Charlott Rakes, MD  Misc. Devices (CANE) MISC 1 each by Does not apply route as needed. 03/04/15   Funches, Adriana Mccallum, MD  psyllium (METAMUCIL) 58.6 % powder Take 1 packet by mouth 2 (two) times daily.    [provider]  rosuvastatin (CRESTOR) 5 MG tablet Take 1 tablet (5 mg total) by mouth daily. 02/12/20 05/12/20  O'NealCassie Freer, MD    Allergies    Atorvastatin  Review of Systems   Review of Systems  Constitutional:  Negative for diaphoresis and fever.  HENT:  Negative for sore throat.   Eyes:  Negative for visual disturbance.  Respiratory:  Negative for cough and shortness of breath.   Cardiovascular:  Positive for chest pain.  Gastrointestinal:  Negative for abdominal pain, nausea and vomiting.  Genitourinary:  Negative for dysuria.  Musculoskeletal:  Positive for back pain and neck pain.  Skin:  Negative for rash.  Neurological:  Positive for  headaches.   Physical Exam Updated Vital Signs BP (!) 145/72   Pulse 63   Temp 98 F (36.7 C) (Oral)   Resp (!) 21   Ht _0  (1.575 m)   Wt 85 kg   SpO2 96%   BMI 34.27 kg/m   Physical Exam Vitals and nursing note reviewed.  Constitutional:      General: She is not in acute distress.    Appearance: Normal appearance. She is well-developed.  HENT:     Head: Normocephalic and atraumatic.  Eyes:     Conjunctiva/sclera: Conjunctivae normal.  Cardiovascular:     Rate and Rhythm: Normal rate and regular rhythm.  Heart sounds: No murmur heard. Pulmonary:     Effort: Pulmonary effort is normal. No respiratory distress.     Breath sounds: Normal breath sounds.  Abdominal:     Palpations: Abdomen is soft.     Tenderness: There is no abdominal tenderness. There is no guarding or rebound.  Musculoskeletal:        General: No swelling. Normal range of motion.     Cervical back: Neck supple.     Comments: She has tenderness through her trapezius and latissimus on the left.  Palpation reproduces her pain.  Skin:    General: Skin is warm and dry.     Capillary Refill: Capillary refill takes less than 2 seconds.  Neurological:     General: No focal deficit present.     Mental Status: She is alert.     Sensory: No sensory deficit.     Motor: No weakness.  Psychiatric:        Mood and Affect: Mood normal.    ED Results / Procedures / Treatments   Labs (all labs ordered are listed, but only abnormal results are displayed) Labs Reviewed  BASIC METABOLIC PANEL - Abnormal; Notable for the following components:      Result Value   Glucose, Bld 211 (*)    All other components within normal limits  CBC WITH DIFFERENTIAL/PLATELET  BRAIN NATRIURETIC PEPTIDE  D-DIMER, QUANTITATIVE  TROPONIN I (HIGH SENSITIVITY)  TROPONIN I (HIGH SENSITIVITY)    EKG EKG Interpretation  Date/Time:  Friday April 18 2021 00:05:46 EST Ventricular Rate:  76 PR Interval:  166 QRS  Duration: 82 QT Interval:  378 QTC Calculation: 425 R Axis:   34 Text Interpretation: Normal sinus rhythm Normal ECG No significant change since prior 6/19 Confirmed by Aletta Edouard [phone]) on 04/18/2021 8:05:19 AM  Radiology DG Chest 2 View  Result Date: 04/18/2021 CLINICAL DATA:  Chest pain and shortness of breath. EXAM: CHEST - 2 VIEW COMPARISON:  PA Lat 10/27/2015 FINDINGS: The heart size and mediastinal contours are within normal limits. Both lungs are clear. The visualized skeletal structures are unremarkable. IMPRESSION: No active cardiopulmonary disease or interval changes. Electronically Signed   By: Telford Nab M.D.   On: 04/18/2021 00:35    Procedures Procedures   Medications Ordered in ED Medications - No data to display  ED Course  I have reviewed the triage vital signs and the nursing notes.  Pertinent labs & imaging results that were available during my care of the patient were reviewed by me and considered in my medical decision making (see chart for details).    MDM Rules/Calculators/A&P                          This patient complains of left-sided chest and left-sided upper back pain; this involves an extensive number of treatment Options and is a complaint that carries with it a high risk of complications and Morbidity. The differential includes musculoskeletal pain, pleurisy, PE, pneumothorax, ACS, radiculopathy  I ordered, reviewed and interpreted labs, which included CBC with normal white count normal hemoglobin, chemistries normal other than elevated glucose, D-dimer negative, troponins flat I ordered imaging studies which included chest x-ray and I independently    visualized and interpreted imaging which showed no acute findings Previous records obtained and reviewed in epic no recent admissions  After the interventions stated above, I reevaluated the patient and found patient to be hemodynamically stable.  Reviewed results of work-up  with her and  she is comfortable plan for symptomatic treatment with continuing NSAIDs and will add muscle relaxant.  Return instructions discussed   Final Clinical Impression(s) / ED Diagnoses Final diagnoses:  Nonspecific chest pain  Upper back pain on left side    Rx / DC Orders ED Discharge Orders          Ordered    methocarbamol (ROBAXIN) 500 MG tablet  2 times daily PRN        04/18/21 0858             Hayden Rasmussen, MD 04/18/21 1806

## 2021-04-18 NOTE — Discharge Instructions (Signed)
You were seen in the emergency department for upper left back pain into chest pain radiating to your neck and arm.  You had blood work EKG and a chest x-ray that did not show any serious findings.  Please continue ibuprofen.  We are prescribing a muscle relaxant which may help.  Follow-up with your regular doctor.  Return to the emergency department if any worsening or concerning symptoms

## 2021-04-18 NOTE — Telephone Encounter (Signed)
FYI.  Patient has been discharged from ED

## 2021-04-18 NOTE — ED Triage Notes (Signed)
Patient reports left chest pain with SOB radiating to left arm this evening , mild nausea and lightheaded , pain increases with deep inspiration , she added left upper back pain .

## 2021-04-18 NOTE — Telephone Encounter (Signed)
See encounter

## 2021-04-19 ENCOUNTER — Other Ambulatory Visit: Payer: Self-pay | Admitting: Family Medicine

## 2021-04-19 DIAGNOSIS — G8929 Other chronic pain: Secondary | ICD-10-CM

## 2021-04-19 DIAGNOSIS — M5416 Radiculopathy, lumbar region: Secondary | ICD-10-CM

## 2021-04-19 DIAGNOSIS — M5010 Cervical disc disorder with radiculopathy, unspecified cervical region: Secondary | ICD-10-CM

## 2021-04-19 NOTE — Telephone Encounter (Signed)
Requested Prescriptions  Pending Prescriptions Disp Refills  . acetaminophen-codeine (TYLENOL #3) 300-30 MG tablet [Pharmacy Med Name: Acetaminophen-Codeine #3 300-30 MG Oral Tablet] 60 tablet     Sig: TAKE 1 TABLET BY MOUTH EVERY 12 HOURS AS NEEDED FOR MODERATE PAIN     Not Delegated - Analgesics:  Opioid Agonist Combinations Failed - 04/19/2021  1:09 PM      Failed - This refill cannot be delegated      Failed - Urine Drug Screen completed in last 360 days      Failed - Valid encounter within last 6 months    Recent Outpatient Visits          2 months ago Encounter for [employer] annual wellness exam   [facility] [place], [place], MD   6 months ago Type 2 diabetes mellitus with other specified complication, without long-term current use of insulin (HCC)   [facility] [place], [place], MD   1 year ago Type 2 diabetes mellitus with other specified complication, without long-term current use of insulin (HCC)   [facility] [place], [place], MD   1 year ago Type 2 diabetes mellitus with other specified complication, without long-term current use of insulin (HCC)   [facility] [place], [place], MD   1 year ago Cervical disc disorder with radiculopathy of cervical region   [facility] Hoy Register, MD      Future Appointments            In 4 months Hoy Register, MD [facility]           . ibuprofen (ADVIL) 800 MG tablet [Pharmacy Med Name: Ibuprofen 800 MG Oral Tablet] 60 tablet 0    Sig: TAKE 1 TABLET BY MOUTH EVERY 12 HOURS AS NEEDED     Analgesics:  NSAIDS Passed - 04/19/2021  1:09 PM      Passed - Cr in normal range and within 360 days    Creat  Date Value Ref Range Status  07/12/2015 0.65 0.50 - 1.05 mg/dL Final   Creatinine, Ser  Date Value Ref Range Status  04/18/2021 0.73 0.44 - 1.00 mg/dL  Final   Creatinine, Urine  Date Value Ref Range Status  03/20/2016 53 20 - 320 mg/dL Final         Passed - HGB in normal range and within 360 days    Hemoglobin  Date Value Ref Range Status  04/18/2021 13.9 12.0 - 15.0 g/dL Final  62/69/4854 62.7 11.1 - 15.9 g/dL Final         Passed - Patient is not pregnant      Passed - Valid encounter within last 12 months    Recent Outpatient Visits          2 months ago Encounter for [employer] annual wellness exam   [facility] [place], [place], MD   6 months ago Type 2 diabetes mellitus with other specified complication, without long-term current use of insulin (HCC)   [facility] [place], [place], MD   1 year ago Type 2 diabetes mellitus with other specified complication, without long-term current use of insulin (HCC)   [facility] [place], [place], MD   1 year ago Type 2 diabetes mellitus with other specified complication, without long-term current use of insulin (HCC)     [facility] [place], Odette Horns, MD   1 year ago Cervical disc disorder with radiculopathy of cervical region   [facility] Hoy Register, MD      Future Appointments            In 4 months Hoy Register, MD [facility]

## 2021-04-19 NOTE — Telephone Encounter (Signed)
Requested medication (s) are due for refill today: yes  Requested medication (s) are on the active medication list: yes  Last refill:  09/25/20 #60  2 RF  Future visit scheduled: yes  Notes to clinic:  med not delegated to NT to RF   Requested Prescriptions  Pending Prescriptions Disp Refills   acetaminophen-codeine (TYLENOL #3) 300-30 MG tablet [Pharmacy Med Name: Acetaminophen-Codeine #3 300-30 MG Oral Tablet] 60 tablet     Sig: TAKE 1 TABLET BY MOUTH EVERY 12 HOURS AS NEEDED FOR MODERATE PAIN     Not Delegated - Analgesics:  Opioid Agonist Combinations Failed - 04/19/2021  1:09 PM      Failed - This refill cannot be delegated      Failed - Urine Drug Screen completed in last 360 days      Failed - Valid encounter within last 6 months    Recent Outpatient Visits           2 months ago Encounter for Medicare annual wellness exam   [facility] [place], Odette Horns, MD   6 months ago Type 2 diabetes mellitus with other specified complication, without long-term current use of insulin (HCC)   [facility] [place], [place], MD   1 year ago Type 2 diabetes mellitus with other specified complication, without long-term current use of insulin (HCC)   [facility] [place], [place], MD   1 year ago Type 2 diabetes mellitus with other specified complication, without long-term current use of insulin (HCC)   [facility] [place], [place], MD   1 year ago Cervical disc disorder with radiculopathy of cervical region   [facility] [place], Odette Horns, MD       Future Appointments             In 4 months Hoy Register, MD [facility]            Signed Prescriptions Disp Refills   ibuprofen (ADVIL) 800 MG tablet 60 tablet 0    Sig: TAKE 1 TABLET BY MOUTH EVERY 12 HOURS AS NEEDED     Analgesics:  NSAIDS Passed -  04/19/2021  1:09 PM      Passed - Cr in normal range and within 360 days    Creat  Date Value Ref Range Status  07/12/2015 0.65 0.50 - 1.05 mg/dL Final   Creatinine, Ser  Date Value Ref Range Status  04/18/2021 0.73 0.44 - 1.00 mg/dL Final   Creatinine, Urine  Date Value Ref Range Status  03/20/2016 53 20 - 320 mg/dL Final          Passed - HGB in normal range and within 360 days    Hemoglobin  Date Value Ref Range Status  04/18/2021 13.9 12.0 - 15.0 g/dL Final  73/22/0254 27.0 11.1 - 15.9 g/dL Final          Passed - Patient is not pregnant      Passed - Valid encounter within last 12 months    Recent Outpatient Visits           2 months ago Encounter for [employer] annual wellness exam   [facility] [place], [place], MD   6 months ago Type 2 diabetes mellitus with other specified complication, without long-term current use of insulin (HCC)    [facility] Hoy Register, MD   1  year ago Type 2 diabetes mellitus with other specified complication, without long-term current use of insulin (HCC)   [facility] [place], [place], MD   1 year ago Type 2 diabetes mellitus with other specified complication, without long-term current use of insulin (HCC)   [facility] Hoy Register, MD   1 year ago Cervical disc disorder with radiculopathy of cervical region   [facility] Hoy Register, MD       Future Appointments             In 4 months Hoy Register, MD [facility]

## 2021-06-05 ENCOUNTER — Other Ambulatory Visit: Payer: Self-pay

## 2021-06-05 ENCOUNTER — Ambulatory Visit
Admission: EM | Admit: 2021-06-05 | Discharge: 2021-06-05 | Disposition: A | Discharge status: Other Acute Inpatient Hospital | Payer: Medicare HMO | Attending: Internal Medicine | Admitting: Internal Medicine

## 2021-06-05 ENCOUNTER — Encounter: Payer: Self-pay | Admitting: Emergency Medicine

## 2021-06-05 DIAGNOSIS — R002 Palpitations: Secondary | ICD-10-CM

## 2021-06-05 DIAGNOSIS — R0602 Shortness of breath: Secondary | ICD-10-CM | POA: Diagnosis not present

## 2021-06-05 NOTE — Discharge Instructions (Signed)
Please go to the emergency department as soon as you leave urgent care for further evaluation and management. ?

## 2021-06-05 NOTE — ED Provider Notes (Signed)
[facility]    CSN: [id number] Arrival date & time: 06/05/21  1446      History   Chief Complaint Chief Complaint  Patient presents with   Fatigue    HPI Jill Anderson is a 56 y.o. female.   Patient presents with fatigue, heart palpitations, shortness of breath.  Patient reports that she tested positive for COVID-19 with an at home test approximately 4 weeks ago and fatigue, shortness of breath, palpitations that have been persistent over the past few days.  She also has associated left-sided chest pain that occurs with palpitations.  She reports that the palpitations are constant and keeps her up at night.  Walking exacerbates palpitations, chest pain, shortness of breath.  She also reports that she has had discomfort with chest pain under her left breast.  Denies any fevers.  Denies any chronic lung diseases.    Past Medical History:  Diagnosis Date   Borderline diabetic    Chronic back pain    Diabetes mellitus without complication ([place])    Hypercholesteremia     Patient Active Problem List   Diagnosis Date Noted   Myalgia due to statin 12/12/2018   Vitamin D deficiency 09/21/2017   Insomnia 05/14/2017   Menopausal symptoms 01/04/2017   Urticaria 12/14/2016   Pain in both hands 12/14/2016   Hypertension 10/27/2016   Bilateral anterior knee pain 03/20/2016   Left ureteral stone 10/27/2015   New onset of headaches after age 45 10/07/2015   Hyperlipidemia associated with type 2 diabetes mellitus ([place]) 07/15/2015   DM2 (diabetes mellitus, type 2) ([place]) 03/04/2015   Chronic neck pain 09/11/2014   Cervical disc disorder with radiculopathy of cervical region 09/11/2014   Chronic radicular low back pain 09/11/2014    Past Surgical History:  Procedure Laterality Date   ABDOMINAL HYSTERECTOMY     BACK SURGERY     CYSTOSCOPY WITH RETROGRADE PYELOGRAM, URETEROSCOPY AND STENT PLACEMENT Left 10/28/2015   Procedure: CYSTOSCOPY WITH RETROGRADE PYELOGRAM,  URETEROSCOPY AND STENT PLACEMENT, AND LASER;  Surgeon: Irine Seal, MD;  Location: [facility];  Service: Urology;  Laterality: Left;    OB History   No obstetric history on file.      Home Medications    Prior to Admission medications   Medication Sig Start Date End Date Taking? Authorizing Provider  Accu-Chek FastClix Lancets MISC USE AS DIRECTED to check blood sugar once daily. 12/22/19  Yes Newlin, Enobong, MD  acetaminophen-codeine (TYLENOL #3) 300-30 MG tablet TAKE 1 TABLET BY MOUTH EVERY 12 HOURS AS NEEDED FOR MODERATE PAIN 04/22/21  Yes Charlott Rakes, MD  aspirin EC 81 MG tablet Take 1 tablet (81 mg total) by mouth daily. 09/29/16  Yes Funches, Josalyn, MD  azelastine (ASTELIN) 0.1 % nasal spray Place 2 sprays into both nostrils 2 (two) times daily. 09/29/19  Yes Yu, Amy V, PA-C  Blood Glucose Monitoring Suppl (ACCU-CHEK GUIDE ME) w/Device KIT 1 each by Does not apply route daily. 06/13/18  Yes Charlott Rakes, MD  cetirizine (ZYRTEC ALLERGY) 10 MG tablet Take 1 tablet (10 mg total) by mouth daily. 10/26/19  Yes Hall-Potvin, [place], PA-C  dapagliflozin propanediol (FARXIGA) 10 MG TABS tablet Take 1 tablet (10 mg total) by mouth daily before breakfast. 02/13/21  Yes Newlin, Enobong, MD  diclofenac Sodium (VOLTAREN) 1 % GEL Apply 4 g topically 4 (four) times daily. 04/16/20  Yes Charlott Rakes, MD  Elastic Bandages & Supports ([place] M 15-20MMHG) MISC 1 each by Does not apply route  daily. 03/20/16  Yes Funches, Josalyn, MD  erythromycin ophthalmic ointment Place 1 application into the left eye every 6 (six) hours. Place 1/2 inch ribbon of ointment in the affected eye 4 times a day for 5 days 02/18/19  Yes Harris, Abigail, PA-C  famotidine (PEPCID) 20 MG tablet Take 1 tablet (20 mg total) by mouth 2 (two) times daily. 11/21/18  Yes Hedges, Dellis Filbert, PA-C  fluticasone (FLONASE) 50 MCG/ACT nasal spray Place 2 sprays into both nostrils daily. 04/16/20  Yes Newlin, Enobong, MD  glucose  blood (ACCU-CHEK GUIDE) test strip USE TO CHECK BLOOD SUGAR ONCE DAILY 02/04/21  Yes Charlott Rakes, MD  hydrocortisone (ANUSOL-HC) 25 MG suppository Place 1 suppository (25 mg total) rectally 2 (two) times daily. 03/19/20  Yes Hall-Potvin, [place], PA-C  ibuprofen (ADVIL) 800 MG tablet TAKE 1 TABLET BY MOUTH EVERY 12 HOURS AS NEEDED 04/19/21  Yes Newlin, Enobong, MD  methocarbamol (ROBAXIN) 500 MG tablet Take 1 tablet (500 mg total) by mouth 2 (two) times daily as needed for muscle spasms. 04/18/21  Yes Hayden Rasmussen, MD  Misc. Devices (CANE) MISC 1 each by Does not apply route as needed. 03/04/15  Yes Funches, Josalyn, MD  psyllium (METAMUCIL) 58.6 % powder Take 1 packet by mouth 2 (two) times daily.   Yes [provider]  rosuvastatin (CRESTOR) 5 MG tablet Take 1 tablet (5 mg total) by mouth daily. 02/12/20 05/12/20  O'Neal, Cassie Freer, MD    Family History Family History  Problem Relation Age of Onset   Diabetes Mother    Hypertension Mother    Heart disease Mother        based at age 20 of MI   Diabetes Father    Hypertension Father    Heart disease Father    Cancer Maternal Aunt        liver    Social History Social History   Tobacco Use   Smoking status: Never   Smokeless tobacco: Never  Vaping Use   Vaping Use: Never used  Substance Use Topics   Alcohol use: No   Drug use: No     Allergies   Atorvastatin   Review of Systems Review of Systems Per HPI  Physical Exam Triage Vital Signs ED Triage Vitals  Enc Vitals Group     BP 06/05/21 1545 (!) 143/81     Pulse Rate 06/05/21 1545 75     Resp 06/05/21 1545 20     Temp 06/05/21 1545 98 F (36.7 C)     Temp Source 06/05/21 1545 Oral     SpO2 06/05/21 1545 97 %     Weight 06/05/21 1547 170 lb (77.1 kg)     Height 06/05/21 1547 5' 2"  (1.575 m)     Head Circumference --      Peak Flow --      Pain Score 06/05/21 1547 7     Pain Loc --      Pain Edu? --      Excl. in [place]? --    No data  found.  Updated Vital Signs BP (!) 143/81 (BP Location: Right Arm)    Pulse 75    Temp 98 F (36.7 C) (Oral)    Resp 20    Ht 5' 2"  (1.575 m)    Wt 170 lb (77.1 kg)    SpO2 97%    BMI 31.09 kg/m   Visual Acuity Right Eye Distance:   Left Eye Distance:   Bilateral Distance:  Right Eye Near:   Left Eye Near:    Bilateral Near:     Physical Exam Constitutional:      General: She is not in acute distress.    Appearance: Normal appearance. She is not toxic-appearing or diaphoretic.  HENT:     Head: Normocephalic and atraumatic.  Eyes:     Extraocular Movements: Extraocular movements intact.     Conjunctiva/sclera: Conjunctivae normal.  Cardiovascular:     Rate and Rhythm: Normal rate and regular rhythm.     Pulses: Normal pulses.  Pulmonary:     Effort: Pulmonary effort is normal. No respiratory distress.     Breath sounds: Normal breath sounds.  Neurological:     General: No focal deficit present.     Mental Status: She is alert and oriented to person, place, and time. Mental status is at baseline.  Psychiatric:        Mood and Affect: Mood normal.        Behavior: Behavior normal.        Thought Content: Thought content normal.        Judgment: Judgment normal.     UC Treatments / Results  Labs (all labs ordered are listed, but only abnormal results are displayed) Labs Reviewed - No data to display  EKG   Radiology No results found.  Procedures Procedures (including critical care time)  Medications Ordered in UC Medications - No data to display  Initial Impression / Assessment and Plan / UC Course  I have reviewed the triage vital signs and the nursing notes.  Pertinent labs & imaging results that were available during my care of the patient were reviewed by me and considered in my medical decision making (see chart for details).     EKG showing normal sinus rhythm.  Although, patient is still experiencing palpitations and shortness of breath during  physical exam.  Advised patient that it would be best for her to go to emergency department for further evaluation and management as she appears to need more extensive evaluation than can be provided at the urgent care.  Patient was agreeable with plan.  Do not think that EMS transport is necessary as vital signs and patient is stable.  Patient went to the hospital via self transport. Final Clinical Impressions(s) / UC Diagnoses   Final diagnoses:  Palpitations  Shortness of breath     Discharge Instructions      Please go to the emergency department as soon as you leave urgent care for further evaluation and management.    ED Prescriptions   None    PDMP not reviewed this encounter.   Teodora Medici,  06/05/21 [phone]

## 2021-06-05 NOTE — ED Triage Notes (Signed)
Patient states that she had COVID x 4 weeks ago, very fatigued and heart palpitations.  Patient is still experiencing fatigue and SOB.  Patient having some discomfort under left breast that radiates around to her back.

## 2021-06-06 ENCOUNTER — Encounter: Payer: Self-pay | Admitting: Family Medicine

## 2021-06-06 ENCOUNTER — Telehealth: Payer: Self-pay | Admitting: Family Medicine

## 2021-06-06 ENCOUNTER — Other Ambulatory Visit: Payer: Self-pay | Admitting: Family Medicine

## 2021-06-06 DIAGNOSIS — U099 Post covid-19 condition, unspecified: Secondary | ICD-10-CM

## 2021-06-06 MED ORDER — HYDROCOD POLST-CPM POLST ER 10-8 MG/5ML PO SUER: 5.0000 mL | Freq: Two times a day (BID) | ORAL | 0 refills | Status: DC | PRN

## 2021-06-06 NOTE — Telephone Encounter (Signed)
CT has been scheduled an MyChart message has been sent to the patient in regards to appointment

## 2021-06-06 NOTE — Telephone Encounter (Signed)
Please schedule CT chest for this patient. Thank you!

## 2021-06-10 ENCOUNTER — Other Ambulatory Visit: Payer: Self-pay | Admitting: Family Medicine

## 2021-06-10 DIAGNOSIS — E1169 Type 2 diabetes mellitus with other specified complication: Secondary | ICD-10-CM

## 2021-06-10 NOTE — Telephone Encounter (Signed)
Requested Prescriptions  Pending Prescriptions Disp Refills   ACCU-CHEK GUIDE test strip [Pharmacy Med Name: Accu-Chek Guide In Vitro Strip] 50 each 0    Sig: USE TO CHECK BLOOD SUGAR ONCE DAILY     Endocrinology: Diabetes - Testing Supplies Passed - 06/10/2021  5:57 PM      Passed - Valid encounter within last 12 months    Recent Outpatient Visits          3 months ago Encounter for [employer] annual wellness exam   [facility] [place], [place], MD   8 months ago Type 2 diabetes mellitus with other specified complication, without long-term current use of insulin (HCC)   [facility] [place], [place], MD   1 year ago Type 2 diabetes mellitus with other specified complication, without long-term current use of insulin (HCC)   [facility] [place], [place], MD   1 year ago Type 2 diabetes mellitus with other specified complication, without long-term current use of insulin (HCC)   [facility] [place], Odette Horns, MD   1 year ago Cervical disc disorder with radiculopathy of cervical region   [facility] Hoy Register, MD      Future Appointments            In 1 month Rudd, Bertram Millard, MD [facility] [address] [facility]   In 2 months Hoy Register, MD [facility]

## 2021-06-13 ENCOUNTER — Ambulatory Visit (HOSPITAL_COMMUNITY)
Admission: RE | Admit: 2021-06-13 | Discharge: 2021-06-13 | Disposition: A | Discharge status: Home / Self Care | Payer: Medicare HMO | Source: Ambulatory Visit | Attending: Family Medicine | Admitting: Family Medicine

## 2021-06-13 ENCOUNTER — Other Ambulatory Visit: Payer: Self-pay

## 2021-06-13 DIAGNOSIS — U099 Post covid-19 condition, unspecified: Secondary | ICD-10-CM | POA: Insufficient documentation

## 2021-06-13 DIAGNOSIS — R918 Other nonspecific abnormal finding of lung field: Secondary | ICD-10-CM | POA: Diagnosis not present

## 2021-06-13 DIAGNOSIS — K760 Fatty (change of) liver, not elsewhere classified: Secondary | ICD-10-CM | POA: Insufficient documentation

## 2021-06-13 DIAGNOSIS — R911 Solitary pulmonary nodule: Secondary | ICD-10-CM | POA: Diagnosis not present

## 2021-06-13 DIAGNOSIS — I700 Atherosclerosis of aorta: Secondary | ICD-10-CM | POA: Insufficient documentation

## 2021-06-13 DIAGNOSIS — D849 Immunodeficiency, unspecified: Secondary | ICD-10-CM | POA: Insufficient documentation

## 2021-06-13 LAB — POCT I-STAT CREATININE: Creatinine, Ser: 0.6 mg/dL (ref 0.44–1.00)

## 2021-06-13 MED ORDER — IOHEXOL 300 MG/ML  SOLN
100.0000 mL | Freq: Once | INTRAMUSCULAR | Status: AC | PRN
  Administered 2021-06-13: 100 mL via INTRAVENOUS

## 2021-06-13 MED ORDER — SODIUM CHLORIDE (PF) 0.9 % IJ SOLN
INTRAMUSCULAR | Status: AC
  Filled 2021-06-13: qty 50

## 2021-08-04 ENCOUNTER — Other Ambulatory Visit: Payer: Self-pay | Admitting: Family Medicine

## 2021-08-04 DIAGNOSIS — G8929 Other chronic pain: Secondary | ICD-10-CM

## 2021-08-04 DIAGNOSIS — M5010 Cervical disc disorder with radiculopathy, unspecified cervical region: Secondary | ICD-10-CM

## 2021-08-04 DIAGNOSIS — M5416 Radiculopathy, lumbar region: Secondary | ICD-10-CM

## 2021-08-05 ENCOUNTER — Other Ambulatory Visit: Payer: Self-pay

## 2021-08-05 NOTE — Telephone Encounter (Signed)
Requested Prescriptions  ?Pending Prescriptions Disp Refills  ?? ibuprofen (ADVIL) 800 MG tablet [Pharmacy Med Name: Ibuprofen 800 MG Oral Tablet] 60 tablet 0  ?  Sig: TAKE 1 TABLET BY MOUTH EVERY 12 HOURS AS NEEDED  ?  ? Analgesics:  NSAIDS Failed - 08/04/2021  2:02 PM  ?  ?  Failed - Manual Review: Labs are only required if the patient has taken medication for more than 8 weeks.  ?  ?  Passed - Cr in normal range and within 360 days  ?  Creat  ?Date Value Ref Range Status  ?07/12/2015 0.65 0.50 - 1.05 mg/dL Final  ? ?Creatinine, Ser  ?Date Value Ref Range Status  ?06/13/2021 0.60 0.44 - 1.00 mg/dL Final  ? ?Creatinine, Urine  ?Date Value Ref Range Status  ?03/20/2016 53 20 - 320 mg/dL Final  ?   ?  ?  Passed - HGB in normal range and within 360 days  ?  Hemoglobin  ?Date Value Ref Range Status  ?04/18/2021 13.9 12.0 - 15.0 g/dL Final  ?09/25/2020 14.2 11.1 - 15.9 g/dL Final  ?   ?  ?  Passed - PLT in normal range and within 360 days  ?  Platelets  ?Date Value Ref Range Status  ?04/18/2021 289 150 - 400 K/uL Final  ?09/25/2020 273 150 - 450 x10E3/uL Final  ?   ?  ?  Passed - HCT in normal range and within 360 days  ?  HCT  ?Date Value Ref Range Status  ?04/18/2021 41.5 36.0 - 46.0 % Final  ? ?Hematocrit  ?Date Value Ref Range Status  ?09/25/2020 42.1 34.0 - 46.6 % Final  ?   ?  ?  Passed - eGFR is 30 or above and within 360 days  ?  GFR, Est African American  ?Date Value Ref Range Status  ?07/12/2015 >89 >=60 mL/min Final  ? ?GFR calc Af Amer  ?Date Value Ref Range Status  ?12/19/2019 101 >59 mL/min/1.73 Final  ?  Comment:  ?  **[facility] currently reports eGFR in compliance with the current** ?  recommendations of the [employer]. [facility] will ?  update reporting as new guidelines are published from the NKF-ASN ?  Task force. ?  ? ?GFR, Est Non African American  ?Date Value Ref Range Status  ?07/12/2015 >89 >=60 mL/min Final  ?  Comment:  ?    ?The estimated GFR is a calculation valid for adults (>=74  years old) ?that uses the CKD-EPI algorithm to adjust for age and sex. It is   ?not to be used for children, pregnant women, hospitalized patients,    ?patients on dialysis, or with rapidly changing kidney function. ?According to the NKDEP, eGFR >89 is normal, 60-89 shows mild ?impairment, 30-59 shows moderate impairment, 15-29 shows severe ?impairment and <15 is ESRD. ?  ?  ? ?GFR, Estimated  ?Date Value Ref Range Status  ?04/18/2021 >60 >60 mL/min Final  ?  Comment:  ?  (NOTE) ?Calculated using the CKD-EPI Creatinine Equation (2021) ?  ? ?eGFR  ?Date Value Ref Range Status  ?02/12/2021 93 >59 mL/min/1.73 Final  ?   ?  ?  Passed - Patient is not pregnant  ?  ?  Passed - Valid encounter within last 12 months  ?  Recent Outpatient Visits   ?      ? 5 months ago Encounter for [employer] annual wellness exam  ? [place], Enobong, MD  ?  10 months ago Type 2 diabetes mellitus with other specified complication, without long-term current use of insulin ([place])  ? [place], Charlane Ferretti, MD  ? 1 year ago Type 2 diabetes mellitus with other specified complication, without long-term current use of insulin ([place])  ? [place], Charlane Ferretti, MD  ? 1 year ago Type 2 diabetes mellitus with other specified complication, without long-term current use of insulin ([place])  ? [place], Charlane Ferretti, MD  ? 1 year ago Cervical disc disorder with radiculopathy of cervical region  ? [place] Charlott Rakes, MD  ?  ?  ?Future Appointments   ?        ? Tomorrow Rudd, Lillette Boxer, MD [facility] [place], [place]  ? In 2 weeks Charlott Rakes, MD [place]  ?  ? ?  ?  ?  ? ? ?

## 2021-08-06 ENCOUNTER — Ambulatory Visit (INDEPENDENT_AMBULATORY_CARE_PROVIDER_SITE_OTHER): Payer: Medicare HMO | Admitting: Family Medicine

## 2021-08-06 ENCOUNTER — Encounter: Payer: Self-pay | Admitting: Family Medicine

## 2021-08-06 VITALS — BP 138/80 | HR 95 | Temp 97.3°F | Ht 62.75 in | Wt 173.2 lb

## 2021-08-06 DIAGNOSIS — Z789 Other specified health status: Secondary | ICD-10-CM | POA: Diagnosis not present

## 2021-08-06 DIAGNOSIS — E1165 Type 2 diabetes mellitus with hyperglycemia: Secondary | ICD-10-CM

## 2021-08-06 DIAGNOSIS — M5137 Other intervertebral disc degeneration, lumbosacral region: Secondary | ICD-10-CM

## 2021-08-06 DIAGNOSIS — I10 Essential (primary) hypertension: Secondary | ICD-10-CM | POA: Diagnosis not present

## 2021-08-06 DIAGNOSIS — M5010 Cervical disc disorder with radiculopathy, unspecified cervical region: Secondary | ICD-10-CM | POA: Diagnosis not present

## 2021-08-06 DIAGNOSIS — J309 Allergic rhinitis, unspecified: Secondary | ICD-10-CM | POA: Insufficient documentation

## 2021-08-06 DIAGNOSIS — K649 Unspecified hemorrhoids: Secondary | ICD-10-CM | POA: Insufficient documentation

## 2021-08-06 DIAGNOSIS — E782 Mixed hyperlipidemia: Secondary | ICD-10-CM

## 2021-08-06 LAB — MICROALBUMIN / CREATININE URINE RATIO
Creatinine,U: 132.7 mg/dL
Microalb Creat Ratio: 0.8 mg/g (ref 0.0–30.0)
Microalb, Ur: 1.1 mg/dL (ref 0.0–1.9)

## 2021-08-06 LAB — BASIC METABOLIC PANEL
BUN: 14 mg/dL (ref 6–23)
CO2: 27 mEq/L (ref 19–32)
Calcium: 10.2 mg/dL (ref 8.4–10.5)
Chloride: 101 mEq/L (ref 96–112)
Creatinine, Ser: 0.65 mg/dL (ref 0.40–1.20)
GFR: 97.99 mL/min (ref >60.00)
Glucose, Bld: 127 mg/dL — ABNORMAL HIGH (ref 70–99)
Potassium: 4.4 mEq/L (ref 3.5–5.1)
Sodium: 138 mEq/L (ref 135–145)

## 2021-08-06 LAB — LIPID PANEL
Cholesterol: 228 mg/dL — ABNORMAL HIGH (ref 0–200)
HDL: 57.8 mg/dL (ref >39.00)
LDL Cholesterol: 139 mg/dL — ABNORMAL HIGH (ref 0–99)
NonHDL: 169.94
Total CHOL/HDL Ratio: 4
Triglycerides: 153 mg/dL — ABNORMAL HIGH (ref 0.0–149.0)
VLDL: 30.6 mg/dL (ref 0.0–40.0)

## 2021-08-06 LAB — URINALYSIS, ROUTINE W REFLEX MICROSCOPIC
Bilirubin Urine: NEGATIVE
Hgb urine dipstick: NEGATIVE
Ketones, ur: NEGATIVE
Leukocytes,Ua: NEGATIVE
Nitrite: NEGATIVE
RBC / HPF: NONE SEEN (ref 0–?)
Specific Gravity, Urine: 1.025 (ref 1.000–1.030)
Total Protein, Urine: NEGATIVE
Urine Glucose: NEGATIVE
Urobilinogen, UA: 0.2 (ref 0.0–1.0)
pH: 6 (ref 5.0–8.0)

## 2021-08-06 LAB — HEMOGLOBIN A1C: Hgb A1c MFr Bld: 8 % — ABNORMAL HIGH (ref 4.6–6.5)

## 2021-08-06 MED ORDER — GLIPIZIDE 5 MG PO TABS: 5.0000 mg | ORAL_TABLET | Freq: Two times a day (BID) | ORAL | 3 refills | Status: DC

## 2021-08-06 NOTE — Addendum Note (Signed)
Addended by: Loyola Mast on: 08/06/2021 06:21 PM ? ? Modules accepted: Orders ? ?

## 2021-08-06 NOTE — Progress Notes (Signed)
?[facility] ?[facility] ?[place] ?[place] [address] ?Dept: [phone] ?Dept Fax: [phone] ? ?Transfer of Care Office Visit ? ?Subjective:  ? ? Patient ID: Jill Anderson, female    DOB: [birth year], 56 y.o..   MRN: [id number] ? ?Chief Complaint  ?Patient presents with  ? Establish Care  ?  NP-establish care.    ? ? ?History of Present Illness: ? ?Patient is in today to establish care. Ms. Paterson was born in [place]. Her family moved to [place], [place] when she was 24 months old. She moved to [place] 7 years ago. She is disabled due to neck and lower back issues. 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 Ms. Mcglade is single. She has 3 kids (5, 31, 35); two in [place] and one in the [place]. She has 16 grandchildren and one great-grandchild. Two of her grandchildren live with her currently. Ms. Widger denies use of tobacco, alcohol, or drugs. ? ?Ms. Rogoff has a history of Type 2 diabetes. She notes she was unable to tolerate metformin due to GI symptoms. She was most recently prescribed Farxiga, but found this too costly, so has been trying to use some metformin she had left over. She asks about other therapies she could be on. ? ?Ms. Krouse  has a history of hypertension. She is not currently on an antihypertensive. ? ?Ms. Beason has a history of hyperlipidemia. she has been unable to tolerate statins due to myalgias. She is not currently on any lipid lowering therapy. ? ?Ms. Cheeks has a history of cervical and lumbar disc disease. She notes that she had been offered spinal fusion for her lower back, but has declined to proceed with this. She is currently managed on Tylenol and ibuprofen. She takes about 2-3 Tylenol #3 per week for more intense pain (receives #60 every 90 days). ? ?Past Medical History: ?Patient Active Problem List  ? Diagnosis Date Noted  ? Statin intolerance 12/12/2018  ? Vitamin D  deficiency 09/21/2017  ? Insomnia 05/14/2017  ? Menopausal symptoms 01/04/2017  ? Urticaria 12/14/2016  ? Pain in both hands 12/14/2016  ? Essential hypertension 10/27/2016  ? Bilateral anterior knee pain 03/20/2016  ? DDD (degenerative disc disease), lumbosacral 11/15/2015  ? Low back pain radiating to both legs 11/15/2015  ? Left ureteral stone 10/27/2015  ? New onset of headaches after age 70 10/07/2015  ? Hyperlipidemia 07/15/2015  ? Type 2 diabetes mellitus ([place]) 03/04/2015  ? Chronic neck pain 09/11/2014  ? Cervical disc disorder with radiculopathy of cervical region 09/11/2014  ? Chronic radicular low back pain 09/11/2014  ? ?Past Surgical History:  ?Procedure Laterality Date  ? ABDOMINAL HYSTERECTOMY    ? BACK SURGERY    ? CYSTOSCOPY WITH RETROGRADE PYELOGRAM, URETEROSCOPY AND STENT PLACEMENT Left 10/28/2015  ? Procedure: CYSTOSCOPY WITH RETROGRADE PYELOGRAM, URETEROSCOPY AND STENT PLACEMENT, AND LASER;  Surgeon: Irine Seal, MD;  Location: [facility];  Service: Urology;  Laterality: Left;  ? ?Family History  ?Problem Relation Age of Onset  ? Diabetes Mother   ? Hypertension Mother   ? Heart disease Mother   ?     based at age 56 of MI  ? Diabetes Father   ? Hypertension Father   ? Heart disease Father   ? Cancer Maternal Aunt   ?     liver  ? ?Outpatient Medications Prior to Visit  ?Medication Sig Dispense Refill  ? Accu-Chek [employer]  Lancets MISC USE AS DIRECTED to check blood sugar once daily. 100 each 11  ? ACCU-CHEK GUIDE test strip USE TO CHECK BLOOD SUGAR ONCE DAILY 50 each 1  ? acetaminophen-codeine (TYLENOL #3) 300-30 MG tablet TAKE 1 TABLET BY MOUTH EVERY 12 HOURS AS NEEDED FOR MODERATE PAIN 60 tablet 1  ? aspirin EC 81 MG tablet Take 1 tablet (81 mg total) by mouth daily. 30 tablet 11  ? azelastine (ASTELIN) 0.1 % nasal spray Place 2 sprays into both nostrils 2 (two) times daily. 30 mL 0  ? Blood Glucose Monitoring Suppl (ACCU-CHEK GUIDE ME) w/Device KIT 1 each by Does not apply route daily. 1 kit 0  ?  cetirizine (ZYRTEC ALLERGY) 10 MG tablet Take 1 tablet (10 mg total) by mouth daily. 30 tablet 0  ? diclofenac Sodium (VOLTAREN) 1 % GEL Apply 4 g topically 4 (four) times daily. 100 g 1  ? Elastic Bandages & Supports (TRUFORM ARM 64 M 15-20MMHG) MISC 1 each by Does not apply route daily. 1 each 0  ? fluticasone (FLONASE) 50 MCG/ACT nasal spray Place 2 sprays into both nostrils daily. 1 g 0  ? hydrocortisone (ANUSOL-HC) 25 MG suppository Place 1 suppository (25 mg total) rectally 2 (two) times daily. 12 suppository 0  ? ibuprofen (ADVIL) 800 MG tablet TAKE 1 TABLET BY MOUTH EVERY 12 HOURS AS NEEDED 60 tablet 0  ? methocarbamol (ROBAXIN) 500 MG tablet Take 1 tablet (500 mg total) by mouth 2 (two) times daily as needed for muscle spasms. 20 tablet 0  ? Misc. Devices (CANE) MISC 1 each by Does not apply route as needed. 1 each 0  ? psyllium (METAMUCIL) 58.6 % powder Take 1 packet by mouth 2 (two) times daily.    ? dapagliflozin propanediol (FARXIGA) 10 MG TABS tablet Take 1 tablet (10 mg total) by mouth daily before breakfast. (Patient not taking: Reported on 08/06/2021) 90 tablet 1  ? chlorpheniramine-HYDROcodone (TUSSIONEX PENNKINETIC ER) 10-8 MG/5ML SUER Take 5 mLs by mouth every 12 (twelve) hours as needed for cough. 115 mL 0  ? erythromycin ophthalmic ointment Place 1 application into the left eye every 6 (six) hours. Place 1/2 inch ribbon of ointment in the affected eye 4 times a day for 5 days 3.5 g 0  ? famotidine (PEPCID) 20 MG tablet Take 1 tablet (20 mg total) by mouth 2 (two) times daily. 30 tablet 0  ? rosuvastatin (CRESTOR) 5 MG tablet Take 1 tablet (5 mg total) by mouth daily. 90 tablet 3  ? ?No facility-administered medications prior to visit.  ? ?Allergies  ?Allergen Reactions  ? Atorvastatin Other (See Comments)  ?  myalgia  ?   ?Objective:  ? ?Today's Vitals  ? 08/06/21 1319  ?BP: 138/80  ?Pulse: 95  ?Temp: (!) 97.3 ?F (36.3 ?C)  ?TempSrc: Temporal  ?SpO2: 98%  ?Weight: 173 lb 3.2 oz (78.6 kg)   ?Height: 5' 2.75" (1.594 m)  ? ?Body mass index is 30.93 kg/m?.  ? ?General: Well developed, well nourished. No acute distress. ?Psych: Alert and oriented. Normal mood and affect. ? ?Health Maintenance Due  ?Topic Date Due  ? Zoster Vaccines- Shingrix (1 of 2) Never done  ? OPHTHALMOLOGY EXAM  12/16/2018  ? COLONOSCOPY (Pts 45-16yr Insurance coverage will need to be confirmed)  06/01/2021  ? URINE MICROALBUMIN  09/25/2021  ?   ?Assessment & Plan:  ? ?1. Type 2 diabetes mellitus with hyperglycemia, without long-term current use of insulin ([place] Level ?I will check annual  diabetes labs. Since she has been intolerant of metformin and is having difficulty affording more expensive, newer medicines, I will start her on glipizide. I will see her back in 3 months. I would consider adding pioglitazone later if needed. ? ?- Microalbumin / creatinine urine ratio ?- Basic metabolic panel ?- Hemoglobin A1c ?- Urinalysis, Routine w reflex microscopic ?- glipiZIDE (GLUCOTROL) 5 MG tablet; Take 1 tablet (5 mg total) by mouth 2 (two) times daily before a meal.  Dispense: 60 tablet; Refill: 3 ? ?2. Essential hypertension ?Blood pressure is mildly high. We will continue to monitor this. I would consider adding an ACE-I if this remains above 130/80. ? ?3. Cervical disc disorder with radiculopathy of cervical region ?4. DDD (degenerative disc disease), lumbosacral ?Her chronic opioid therapy sounds reasonable. I will plan to continue this when she needs her next fill. ? ?5. Mixed hyperlipidemia ?We will reassess her lipids. If these remain elevated, I will consider referral to the lipid Clinic. ? ?- Lipid panel ? ?6. Statin intolerance ?Noted. ? ?Return for Scheduled appointment to assess headaches.  ? ?Haydee Salter, MD ?

## 2021-08-20 ENCOUNTER — Ambulatory Visit: Payer: Medicare HMO | Admitting: Family Medicine

## 2021-10-01 ENCOUNTER — Ambulatory Visit (INDEPENDENT_AMBULATORY_CARE_PROVIDER_SITE_OTHER): Payer: Medicare HMO | Admitting: Family Medicine

## 2021-10-01 VITALS — BP 126/70 | HR 70 | Temp 97.6°F | Ht 62.75 in | Wt 175.8 lb

## 2021-10-01 DIAGNOSIS — E1165 Type 2 diabetes mellitus with hyperglycemia: Secondary | ICD-10-CM

## 2021-10-01 DIAGNOSIS — R1032 Left lower quadrant pain: Secondary | ICD-10-CM

## 2021-10-01 DIAGNOSIS — I10 Essential (primary) hypertension: Secondary | ICD-10-CM

## 2021-10-01 DIAGNOSIS — E782 Mixed hyperlipidemia: Secondary | ICD-10-CM

## 2021-10-01 LAB — HEMOGLOBIN A1C: Hgb A1c MFr Bld: 7.2 % — ABNORMAL HIGH (ref 4.6–6.5)

## 2021-10-01 LAB — GLUCOSE, RANDOM: Glucose, Bld: 148 mg/dL — ABNORMAL HIGH (ref 70–99)

## 2021-10-01 MED ORDER — METFORMIN HCL 500 MG PO TABS: 500.0000 mg | ORAL_TABLET | Freq: Two times a day (BID) | ORAL | 3 refills | Status: DC

## 2021-10-01 NOTE — Progress Notes (Signed)
?[facility] ?[facility] ?[facility] ?[place] [address] ?Dept: [phone] ?Dept Fax: [phone] ? ?Chronic Care Office Visit ? ?Subjective:  ? ? Patient ID: Jill Anderson, female    DOB: 1966-02-15, 56 y.o..   MRN: [id number] ? ?Chief Complaint  ?Patient presents with  ? Follow-up  ?  F/u meds, reports having stomach pain after taking Glipizide for 1 month.  Also states the HA's are still off/on and havin a sharp pain in th LT lower abdomen x 3 days.  She has been taking the Tylenol w/Codeine.    ? ? ?History of Present Illness: ? ?Patient is in today for reassessment of chronic medical issues. ? ?Ms. Loney has a history of Type 2 diabetes. At her last visit, we had started her on glipizide. She notes that this was causing a burning sensation in her stomach, so she switched back to metformin 500 mg bid. Although she has had some GI symptoms with metformin in the past, she feels she can tolerate this. She had been on Farxiga in the past, but could not afford to continue this. ?  ?Ms. Arpin  has a history of hypertension. She is not currently on an antihypertensive. ?  ?Ms. Fasig has a history of hyperlipidemia. She has been unable to tolerate statins due to myalgias. She is not currently on any lipid lowering therapy. At her last appointment, I referred her to the [facility], but she cannot get in until Aug. ? ?Ms. Reiger notes that she has been having sharp LLQ abdominal pain over the past 3 days. She notes the pains come and go over a matter of seconds. She has not run fever. She denies any vomiting or constipation. her stools have been a little loose compared to normal. She has seen no blood in her stool. She has a past history of passing a kidney stone. She denies any dysuria, frequency or blood in her urine. She has had a prior total hysterectomy/BSO. ? ?Past Medical History: ?Patient Active Problem List  ? Diagnosis Date Noted  ? Hemorrhoids 08/06/2021   ? Allergic rhinitis 08/06/2021  ? Statin intolerance 12/12/2018  ? Vitamin D deficiency 09/21/2017  ? Insomnia 05/14/2017  ? Menopausal symptoms 01/04/2017  ? Urticaria 12/14/2016  ? Pain in both hands 12/14/2016  ? Essential hypertension 10/27/2016  ? Bilateral anterior knee pain 03/20/2016  ? DDD (degenerative disc disease), lumbosacral 11/15/2015  ? Low back pain radiating to both legs 11/15/2015  ? Left ureteral stone 10/27/2015  ? New onset of headaches after age 7 10/07/2015  ? Hyperlipidemia 07/15/2015  ? Type 2 diabetes mellitus ([place]) 03/04/2015  ? Chronic neck pain 09/11/2014  ? Cervical disc disorder with radiculopathy of cervical region 09/11/2014  ? Chronic radicular low back pain 09/11/2014  ? ?Past Surgical History:  ?Procedure Laterality Date  ? ABDOMINAL HYSTERECTOMY    ? BACK SURGERY    ? CYSTOSCOPY WITH RETROGRADE PYELOGRAM, URETEROSCOPY AND STENT PLACEMENT Left 10/28/2015  ? Procedure: CYSTOSCOPY WITH RETROGRADE PYELOGRAM, URETEROSCOPY AND STENT PLACEMENT, AND LASER;  Surgeon: Irine Seal, MD;  Location: [facility];  Service: Urology;  Laterality: Left;  ? ?Family History  ?Problem Relation Age of Onset  ? Diabetes Mother   ? Hypertension Mother   ? Heart disease Mother   ?     based at age 62 of MI  ? Diabetes Father   ? Hypertension Father   ? Heart disease Father   ? Cancer Maternal Aunt   ?  liver  ? ?Outpatient Medications Prior to Visit  ?Medication Sig Dispense Refill  ? Accu-Chek FastClix Lancets MISC USE AS DIRECTED to check blood sugar once daily. 100 each 11  ? ACCU-CHEK GUIDE test strip USE TO CHECK BLOOD SUGAR ONCE DAILY 50 each 1  ? acetaminophen-codeine (TYLENOL #3) 300-30 MG tablet TAKE 1 TABLET BY MOUTH EVERY 12 HOURS AS NEEDED FOR MODERATE PAIN 60 tablet 1  ? aspirin EC 81 MG tablet Take 1 tablet (81 mg total) by mouth daily. 30 tablet 11  ? azelastine (ASTELIN) 0.1 % nasal spray Place 2 sprays into both nostrils 2 (two) times daily. 30 mL 0  ? Blood Glucose Monitoring Suppl  (ACCU-CHEK GUIDE ME) w/Device KIT 1 each by Does not apply route daily. 1 kit 0  ? cetirizine (ZYRTEC ALLERGY) 10 MG tablet Take 1 tablet (10 mg total) by mouth daily. 30 tablet 0  ? diclofenac Sodium (VOLTAREN) 1 % GEL Apply 4 g topically 4 (four) times daily. 100 g 1  ? Elastic Bandages & Supports (TRUFORM ARM 41 M 15-20MMHG) MISC 1 each by Does not apply route daily. 1 each 0  ? fluticasone (FLONASE) 50 MCG/ACT nasal spray Place 2 sprays into both nostrils daily. 1 g 0  ? hydrocortisone (ANUSOL-HC) 25 MG suppository Place 1 suppository (25 mg total) rectally 2 (two) times daily. 12 suppository 0  ? ibuprofen (ADVIL) 800 MG tablet TAKE 1 TABLET BY MOUTH EVERY 12 HOURS AS NEEDED 60 tablet 0  ? methocarbamol (ROBAXIN) 500 MG tablet Take 1 tablet (500 mg total) by mouth 2 (two) times daily as needed for muscle spasms. 20 tablet 0  ? Misc. Devices (CANE) MISC 1 each by Does not apply route as needed. 1 each 0  ? psyllium (METAMUCIL) 58.6 % powder Take 1 packet by mouth 2 (two) times daily.    ? dapagliflozin propanediol (FARXIGA) 10 MG TABS tablet Take 1 tablet (10 mg total) by mouth daily before breakfast. 90 tablet 1  ? glipiZIDE (GLUCOTROL) 5 MG tablet Take 1 tablet (5 mg total) by mouth 2 (two) times daily before a meal. 60 tablet 3  ? ?No facility-administered medications prior to visit.  ? ?Allergies  ?Allergen Reactions  ? Atorvastatin Other (See Comments)  ?  myalgia  ? Glipizide Other (See Comments)  ?  Burning in stomach  ? ?Objective:  ? ?Today's Vitals  ? 10/01/21 1415  ?BP: 126/70  ?Pulse: 70  ?Temp: 97.6 ?F (36.4 ?C)  ?TempSrc: Temporal  ?SpO2: 98%  ?Weight: 175 lb 12.8 oz (79.7 kg)  ?Height: 5' 2.75" (1.594 m)  ? ?Body mass index is 31.39 kg/m?.  ? ?General: Well developed, well nourished. No acute distress. ?Abdomen: Soft. Tenderness notes along the inguinal ligament. There is no bulging with valsalva. Bowel sounds positive, normal pitch and  ? frequency. No hepatosplenomegaly. No rebound or  guarding. ?Psych: Alert and oriented. Normal mood and affect. ? ?Health Maintenance Due  ?Topic Date Due  ? Zoster Vaccines- Shingrix (1 of 2) Never done  ?   ?Assessment & Plan:  ? ?1. Type 2 diabetes mellitus with hyperglycemia, without long-term current use of insulin ([place]) ?Ms. Melfi is due for her quarterly DM labs. I will cotninue her on metformin for now. If her A1c is above goal, we would consider adding pioglitazone. ? ?- Glucose, random ?- Hemoglobin A1c ?- metFORMIN (GLUCOPHAGE) 500 MG tablet; Take 1 tablet (500 mg total) by mouth 2 (two) times daily with a meal.  Dispense:  180 tablet; Refill: 3 ? ?2. Essential hypertension ?Blood pressure remains at goal off of medication. ? ?3. Mixed hyperlipidemia ?Intolerant of statins. Pending appointment with Dr. Debara Pickett at the [facility]. ? ?4. Left inguinal pain ?Ms. Belmontes's pain appears to be more over the inguinal area, but I see no physical evidence of a hernia. I will check a urine to see if she has any hematuria, as she has had stones in the past. If positive, I will plan to order a CT Renal stone study. If neg, we will monitor this for resolution or new symptoms. ? ?- Urinalysis w microscopic + reflex cultur ? ?Return in about 3 months (around 01/01/2022) for Reassessment.  ? ?Haydee Salter, MD ?

## 2021-10-03 LAB — URINALYSIS W MICROSCOPIC + REFLEX CULTURE
Bacteria, UA: NONE SEEN /HPF
Bilirubin Urine: NEGATIVE
Glucose, UA: NEGATIVE
Hgb urine dipstick: NEGATIVE
Hyaline Cast: NONE SEEN /LPF
Ketones, ur: NEGATIVE
Nitrites, Initial: NEGATIVE
Protein, ur: NEGATIVE
RBC / HPF: NONE SEEN /HPF (ref 0–2)
Specific Gravity, Urine: 1.013 (ref 1.001–1.035)
Squamous Epithelial / HPF: NONE SEEN /HPF (ref <=5)
WBC, UA: NONE SEEN /HPF (ref 0–5)
pH: 6.5 (ref 5.0–8.0)

## 2021-10-03 LAB — URINE CULTURE
MICRO NUMBER:: 13347939
Result:: NO GROWTH
SPECIMEN QUALITY:: ADEQUATE

## 2021-10-03 LAB — CULTURE INDICATED

## 2021-10-09 ENCOUNTER — Emergency Department (HOSPITAL_COMMUNITY): Payer: Medicare HMO

## 2021-10-09 ENCOUNTER — Encounter (HOSPITAL_COMMUNITY): Payer: Self-pay

## 2021-10-09 ENCOUNTER — Emergency Department (HOSPITAL_COMMUNITY)
Admission: EM | Admit: 2021-10-09 | Discharge: 2021-10-09 | Disposition: A | Discharge status: Home / Self Care | Payer: Medicare HMO | Attending: Emergency Medicine | Admitting: Emergency Medicine

## 2021-10-09 ENCOUNTER — Other Ambulatory Visit: Payer: Self-pay

## 2021-10-09 DIAGNOSIS — K760 Fatty (change of) liver, not elsewhere classified: Secondary | ICD-10-CM | POA: Insufficient documentation

## 2021-10-09 DIAGNOSIS — R1110 Vomiting, unspecified: Secondary | ICD-10-CM | POA: Insufficient documentation

## 2021-10-09 DIAGNOSIS — R6883 Chills (without fever): Secondary | ICD-10-CM | POA: Insufficient documentation

## 2021-10-09 DIAGNOSIS — Z7984 Long term (current) use of oral hypoglycemic drugs: Secondary | ICD-10-CM | POA: Diagnosis not present

## 2021-10-09 DIAGNOSIS — Z7982 Long term (current) use of aspirin: Secondary | ICD-10-CM | POA: Insufficient documentation

## 2021-10-09 DIAGNOSIS — R1013 Epigastric pain: Secondary | ICD-10-CM | POA: Diagnosis not present

## 2021-10-09 DIAGNOSIS — R197 Diarrhea, unspecified: Secondary | ICD-10-CM | POA: Diagnosis not present

## 2021-10-09 DIAGNOSIS — R109 Unspecified abdominal pain: Secondary | ICD-10-CM | POA: Diagnosis not present

## 2021-10-09 DIAGNOSIS — I700 Atherosclerosis of aorta: Secondary | ICD-10-CM | POA: Diagnosis not present

## 2021-10-09 DIAGNOSIS — K429 Umbilical hernia without obstruction or gangrene: Secondary | ICD-10-CM | POA: Diagnosis not present

## 2021-10-09 DIAGNOSIS — E119 Type 2 diabetes mellitus without complications: Secondary | ICD-10-CM | POA: Insufficient documentation

## 2021-10-09 DIAGNOSIS — R1011 Right upper quadrant pain: Secondary | ICD-10-CM | POA: Diagnosis not present

## 2021-10-09 DIAGNOSIS — R112 Nausea with vomiting, unspecified: Secondary | ICD-10-CM | POA: Diagnosis not present

## 2021-10-09 LAB — CBC WITH DIFFERENTIAL/PLATELET
Abs Immature Granulocytes: 0.06 10*3/uL (ref 0.00–0.07)
Abs Immature Granulocytes: 0.04 10*3/uL (ref 0.00–0.07)
Basophils Absolute: 0 10*3/uL (ref 0.0–0.1)
Basophils Absolute: 0 10*3/uL (ref 0.0–0.1)
Basophils Relative: 0 %
Basophils Relative: 0 %
Eosinophils Absolute: 0.2 10*3/uL (ref 0.0–0.5)
Eosinophils Absolute: 0.2 10*3/uL (ref 0.0–0.5)
Eosinophils Relative: 2 %
Eosinophils Relative: 2 %
HCT: 44.8 % (ref 36.0–46.0)
HCT: 43.4 % (ref 36.0–46.0)
Hemoglobin: 14.9 g/dL (ref 12.0–15.0)
Hemoglobin: 14.9 g/dL (ref 12.0–15.0)
Immature Granulocytes: 1 %
Immature Granulocytes: 0 %
Lymphocytes Relative: 9 %
Lymphocytes Relative: 7 %
Lymphs Abs: 1.1 10*3/uL (ref 0.7–4.0)
Lymphs Abs: 0.8 10*3/uL (ref 0.7–4.0)
MCH: 30.6 pg (ref 26.0–34.0)
MCH: 31.2 pg (ref 26.0–34.0)
MCHC: 33.3 g/dL (ref 30.0–36.0)
MCHC: 34.3 g/dL (ref 30.0–36.0)
MCV: 92 fL (ref 80.0–100.0)
MCV: 90.8 fL (ref 80.0–100.0)
Monocytes Absolute: 0.5 10*3/uL (ref 0.1–1.0)
Monocytes Absolute: 0.5 10*3/uL (ref 0.1–1.0)
Monocytes Relative: 4 %
Monocytes Relative: 5 %
Neutro Abs: 10 10*3/uL — ABNORMAL HIGH (ref 1.7–7.7)
Neutro Abs: 9.4 10*3/uL — ABNORMAL HIGH (ref 1.7–7.7)
Neutrophils Relative %: 84 %
Neutrophils Relative %: 86 %
Platelets: 266 10*3/uL (ref 150–400)
Platelets: 254 10*3/uL (ref 150–400)
RBC: 4.87 MIL/uL (ref 3.87–5.11)
RBC: 4.78 MIL/uL (ref 3.87–5.11)
RDW: 11.5 % (ref 11.5–15.5)
RDW: 11.6 % (ref 11.5–15.5)
WBC: 11.9 10*3/uL — ABNORMAL HIGH (ref 4.0–10.5)
WBC: 10.9 10*3/uL — ABNORMAL HIGH (ref 4.0–10.5)
nRBC: 0 % (ref 0.0–0.2)
nRBC: 0 % (ref 0.0–0.2)

## 2021-10-09 LAB — COMPREHENSIVE METABOLIC PANEL
ALT: 43 U/L (ref 0–44)
AST: 33 U/L (ref 15–41)
Albumin: 4 g/dL (ref 3.5–5.0)
Alkaline Phosphatase: 52 U/L (ref 38–126)
Anion gap: 9 (ref 5–15)
BUN: 11 mg/dL (ref 6–20)
CO2: 22 mmol/L (ref 22–32)
Calcium: 9 mg/dL (ref 8.9–10.3)
Chloride: 105 mmol/L (ref 98–111)
Creatinine, Ser: 0.57 mg/dL (ref 0.44–1.00)
GFR, Estimated: >60 mL/min (ref >60)
Glucose, Bld: 218 mg/dL — ABNORMAL HIGH (ref 70–99)
Potassium: 4.2 mmol/L (ref 3.5–5.1)
Sodium: 136 mmol/L (ref 135–145)
Total Bilirubin: 1.5 mg/dL — ABNORMAL HIGH (ref 0.3–1.2)
Total Protein: 7.7 g/dL (ref 6.5–8.1)

## 2021-10-09 LAB — URINALYSIS, ROUTINE W REFLEX MICROSCOPIC
Bilirubin Urine: NEGATIVE
Glucose, UA: 150 mg/dL — AB
Hgb urine dipstick: NEGATIVE
Ketones, ur: NEGATIVE mg/dL
Leukocytes,Ua: NEGATIVE
Nitrite: NEGATIVE
Protein, ur: NEGATIVE mg/dL
Specific Gravity, Urine: 1.04 — ABNORMAL HIGH (ref 1.005–1.030)
pH: 6 (ref 5.0–8.0)

## 2021-10-09 LAB — I-STAT BETA HCG BLOOD, ED (MC, WL, AP ONLY): I-stat hCG, quantitative: <5 m[IU]/mL (ref <5)

## 2021-10-09 LAB — LIPASE, BLOOD: Lipase: 37 U/L (ref 11–51)

## 2021-10-09 MED ORDER — SODIUM CHLORIDE 0.9 % IV BOLUS
1000.0000 mL | Freq: Once | INTRAVENOUS | Status: AC
  Administered 2021-10-09: 1000 mL via INTRAVENOUS

## 2021-10-09 MED ORDER — SODIUM CHLORIDE (PF) 0.9 % IJ SOLN
INTRAMUSCULAR | Status: AC
  Filled 2021-10-09: qty 50

## 2021-10-09 MED ORDER — MORPHINE SULFATE (PF) 4 MG/ML IV SOLN
4.0000 mg | Freq: Once | INTRAVENOUS | Status: AC
  Administered 2021-10-09: 4 mg via INTRAVENOUS
  Filled 2021-10-09: qty 1

## 2021-10-09 MED ORDER — PANTOPRAZOLE SODIUM 40 MG PO TBEC: 40.0000 mg | DELAYED_RELEASE_TABLET | Freq: Every day | ORAL | 0 refills | Status: DC

## 2021-10-09 MED ORDER — ONDANSETRON 8 MG PO TBDP
8.0000 mg | ORAL_TABLET | Freq: Once | ORAL | Status: AC
  Administered 2021-10-09: 8 mg via ORAL
  Filled 2021-10-09: qty 1

## 2021-10-09 MED ORDER — ONDANSETRON HCL 4 MG/2ML IJ SOLN
4.0000 mg | Freq: Once | INTRAMUSCULAR | Status: DC
  Filled 2021-10-09: qty 2

## 2021-10-09 MED ORDER — IOHEXOL 300 MG/ML  SOLN
100.0000 mL | Freq: Once | INTRAMUSCULAR | Status: AC | PRN
  Administered 2021-10-09: 100 mL via INTRAVENOUS

## 2021-10-09 NOTE — ED Provider Notes (Signed)
?[facility]-EMERGENCY DEPT ?Provider Note ? ? ?CSN: [id number] ?Arrival date & time: 10/09/21  0508 ? ?  ? ?History ? ?Chief Complaint  ?Patient presents with  ? Abdominal Pain  ? Chills  ? Emesis  ? Diarrhea  ? Chest Pain  ? ? ?Jill Anderson is a 56 y.o. female. ? ?Patient is a 56-year-old female with past medical history of diabetes, hyperlipidemia, and prior hysterectomy.  Patient presenting today with complaints of abdominal pain.  She describes pain that began 2 days ago in her epigastric region.  This has worsened throughout the day yesterday and has been associated with multiple episodes of vomiting and loose stools.  She denies any bloody stool or vomit.  She has felt chilled, but denies having checked her temperature.  She denies ill contacts. ? ?The history is provided by the patient.  ? ?  ? ?Home Medications ?Prior to Admission medications   ?Medication Sig Start Date End Date Taking? Authorizing Provider  ?Accu-Chek FastClix Lancets MISC USE AS DIRECTED to check blood sugar once daily. 12/22/19   Newlin, Enobong, MD  ?ACCU-CHEK GUIDE test strip USE TO CHECK BLOOD SUGAR ONCE DAILY 06/10/21   Newlin, Enobong, MD  ?acetaminophen-codeine (TYLENOL #3) 300-30 MG tablet TAKE 1 TABLET BY MOUTH EVERY 12 HOURS AS NEEDED FOR MODERATE PAIN 04/22/21   Newlin, Enobong, MD  ?aspirin EC 81 MG tablet Take 1 tablet (81 mg total) by mouth daily. 09/29/16   Funches, Josalyn, MD  ?azelastine (ASTELIN) 0.1 % nasal spray Place 2 sprays into both nostrils 2 (two) times daily. 09/29/19   Yu, Amy V, PA-C  ?Blood Glucose Monitoring Suppl (ACCU-CHEK GUIDE ME) w/Device KIT 1 each by Does not apply route daily. 06/13/18   Newlin, Enobong, MD  ?cetirizine (ZYRTEC ALLERGY) 10 MG tablet Take 1 tablet (10 mg total) by mouth daily. 10/26/19   Hall-Potvin, Brittany, PA-C  ?diclofenac Sodium (VOLTAREN) 1 % GEL Apply 4 g topically 4 (four) times daily. 04/16/20   Newlin, Enobong, MD  ?Elastic Bandages & Supports (TRUFORM ARM  SLEEVE M 15-20MMHG) MISC 1 each by Does not apply route daily. 03/20/16   Funches, Josalyn, MD  ?fluticasone (FLONASE) 50 MCG/ACT nasal spray Place 2 sprays into both nostrils daily. 04/16/20   Newlin, Enobong, MD  ?hydrocortisone (ANUSOL-HC) 25 MG suppository Place 1 suppository (25 mg total) rectally 2 (two) times daily. 03/19/20   Hall-Potvin, Brittany, PA-C  ?ibuprofen (ADVIL) 800 MG tablet TAKE 1 TABLET BY MOUTH EVERY 12 HOURS AS NEEDED 08/05/21   Newlin, Enobong, MD  ?metFORMIN (GLUCOPHAGE) 500 MG tablet Take 1 tablet (500 mg total) by mouth 2 (two) times daily with a meal. 10/01/21   Rudd, Stephen M, MD  ?methocarbamol (ROBAXIN) 500 MG tablet Take 1 tablet (500 mg total) by mouth 2 (two) times daily as needed for muscle spasms. 04/18/21   Butler, Michael C, MD  ?Misc. Devices (CANE) MISC 1 each by Does not apply route as needed. 03/04/15   Funches, Josalyn, MD  ?psyllium (METAMUCIL) 58.6 % powder Take 1 packet by mouth 2 (two) times daily.    [provider]  ?   ? ?Allergies    ?Atorvastatin and Glipizide   ? ?Review of Systems   ?Review of Systems  ?All other systems reviewed and are negative. ? ?Physical Exam ?Updated Vital Signs ?BP (!) 160/91   Pulse (!) 106   Temp 98.2 ?F (36.8 ?C) (Oral)   Resp 18   SpO2 93%  ?Physical Exam ?Vitals   and nursing note reviewed.  ?Constitutional:   ?   General: She is not in acute distress. ?   Appearance: She is well-developed. She is not diaphoretic.  ?HENT:  ?   Head: Normocephalic and atraumatic.  ?Cardiovascular:  ?   Rate and Rhythm: Regular rhythm.  ?   Heart sounds: No murmur heard. ?  No friction rub. No gallop.  ?Pulmonary:  ?   Effort: Pulmonary effort is normal. No respiratory distress.  ?   Breath sounds: Normal breath sounds. No wheezing.  ?Abdominal:  ?   General: Bowel sounds are normal. There is no distension.  ?   Palpations: Abdomen is soft.  ?   Tenderness: There is abdominal tenderness in the right upper quadrant and epigastric area. There is  no right CVA tenderness, left CVA tenderness, guarding or rebound.  ?Musculoskeletal:     ?   General: Normal range of motion.  ?   Cervical back: Normal range of motion and neck supple.  ?Skin: ?   General: Skin is warm and dry.  ?Neurological:  ?   General: No focal deficit present.  ?   Mental Status: She is alert and oriented to person, place, and time.  ? ? ?ED Results / Procedures / Treatments   ?Labs ?(all labs ordered are listed, but only abnormal results are displayed) ?Labs Reviewed  ?CBC WITH DIFFERENTIAL/PLATELET - Abnormal; Notable for the following components:  ?    Result Value  ? WBC 11.9 (*)   ? Neutro Abs 10.0 (*)   ? All other components within normal limits  ?COMPREHENSIVE METABOLIC PANEL  ?LIPASE, BLOOD  ?URINALYSIS, ROUTINE W REFLEX MICROSCOPIC  ?CBC WITH DIFFERENTIAL/PLATELET  ?I-STAT BETA HCG BLOOD, ED ([facility], [facility], [facility] ONLY)  ? ? ?EKG ?None ? ?Radiology ?No results found. ? ?Procedures ?Procedures  ? ? ?Medications Ordered in ED ?Medications  ?sodium chloride 0.9 % bolus 1,000 mL (has no administration in time range)  ?ondansetron (ZOFRAN) injection 4 mg (has no administration in time range)  ?morphine (PF) 4 MG/ML injection 4 mg (has no administration in time range)  ? ? ?ED Course/ Medical Decision Making/ A&P ? ?Patient presenting with complaints of epigastric pain and vomiting that started yesterday.  She is tender in the epigastrium, right upper quadrant, and left upper quadrant.  Symptoms could be related to a viral or foodborne illness, but also could be related to a gallbladder issue.  Patient will undergo CT scan along with receiving fluids and medication.  Care will be signed out to oncoming provider at shift change. ? ?Final Clinical Impression(s) / ED Diagnoses ?Final diagnoses:  ?None  ? ? ?Rx / DC Orders ?ED Discharge Orders   ? ? None  ? ?  ? ? ?  ?Delo, Douglas, MD ?10/10/21 0645 ? ?

## 2021-10-09 NOTE — ED Notes (Signed)
Unsuccessful IV attempt in R AC 20g, R FA 22g. ?

## 2021-10-09 NOTE — ED Provider Notes (Signed)
Signout from Dr. Stark Jock.  56 year old female with epigastric abdominal pain.  Lab work unremarkable patient is pending CT abdomen and pelvis. ?Physical Exam  ?BP (!) 102/59   Pulse 84   Temp 98.2 ?F (36.8 ?C) (Oral)   Resp 11   SpO2 98%  ? ?Physical Exam ? ?Procedures  ?Procedures ? ?ED Course / MDM  ?  ?Medical Decision Making ?Amount and/or Complexity of Data Reviewed ?Labs: ordered. ?Radiology: ordered. ? ?Risk ?Prescription drug management. ? ? ?CT shows fatty liver no clear etiology of pain.  Ordered right upper quadrant ultrasound which shows fatty liver no evidence of cholelithiasis cholecystitis.  Reviewed with patient.  She said she is been told she has fatty liver since she contracted COVID.  Pain is improved.  Will place on PPI.  Patient states she does not wish anything else for pain as narcotics made her feel funny before and she does not want that.  She has a PCP to follow-up with.  Return instructions discussed. ? ? ? ? ?  ?Hayden Rasmussen, MD ?10/09/21 1734 ? ?

## 2021-10-09 NOTE — ED Triage Notes (Signed)
Pt reports with chills, vomiting, diarrhea, abdominal pain, and chest pain x 2 days.  ?

## 2021-10-09 NOTE — Progress Notes (Signed)
PIV consult: Arrived to ED-- site already established. Cancel consult. ?

## 2021-10-09 NOTE — Discharge Instructions (Signed)
You were seen in the emergency department for upper abdominal pain.  You had lab work CAT scan and ultrasound that did not show an obvious explanation for your symptoms.  We are starting you on some acid medication.  Please continue Tylenol as needed for pain.  Follow-up with your primary care doctor.  Return to the emergency department if any high fevers or other concerning symptoms. ?

## 2021-10-09 NOTE — ED Notes (Addendum)
Unsuccessful IV attempt in L AC by Victorino Dike RN 20g. 20g attempt in L FA 20g. ?

## 2021-10-14 ENCOUNTER — Encounter: Payer: Self-pay | Admitting: Gastroenterology

## 2021-10-14 ENCOUNTER — Ambulatory Visit (INDEPENDENT_AMBULATORY_CARE_PROVIDER_SITE_OTHER): Payer: Medicare HMO | Admitting: Family Medicine

## 2021-10-14 VITALS — BP 124/70 | HR 77 | Temp 97.0°F | Ht 62.75 in | Wt 171.8 lb

## 2021-10-14 DIAGNOSIS — K760 Fatty (change of) liver, not elsewhere classified: Secondary | ICD-10-CM | POA: Insufficient documentation

## 2021-10-14 DIAGNOSIS — R1013 Epigastric pain: Secondary | ICD-10-CM

## 2021-10-14 DIAGNOSIS — K429 Umbilical hernia without obstruction or gangrene: Secondary | ICD-10-CM | POA: Insufficient documentation

## 2021-10-14 DIAGNOSIS — I700 Atherosclerosis of aorta: Secondary | ICD-10-CM | POA: Insufficient documentation

## 2021-10-14 NOTE — Progress Notes (Signed)
?[facility] ?[facility] ?[place] ?[place] [address] ?Dept: [phone] ?Dept Fax: [phone] ? ?Office Visit ? ?Subjective:  ? ? Patient ID: Jill Anderson, female    DOB: [birth year], 56 y.o..   MRN: [id number] ? ?Chief Complaint  ?Patient presents with  ? Hospitalization Follow-up  ?  Hospital f/u from 10/09/21 for Abdominal pain.  Still having some stomach pain off/on.    ? ? ?History of Present Illness: ? ?Patient is in today for follow-up from a recent ED visit. Jill Anderson was seen at [facility] on 10/11/2021 with acute onset of upper abdominal/epigastric pain with associated nausea, vomiting, and loose stools. She has not had any hematemesis, hematochezia, or melena. After labs, CT and ultrasound, Jill Anderson was discharged home with a prescription for Nexium. She notes her vomiting and loose stools have resolved. She still feels some mild nausea nd epigastric discomfort, though improved from before. She rarely takes an NSAID. She is not a smoker. I had seen Jill Anderson on 5/3 with LLQ pain. This has improved, but not completely resolved. ? ?Past Medical History: ?Patient Active Problem List  ? Diagnosis Date Noted  ? Hemorrhoids 08/06/2021  ? Allergic rhinitis 08/06/2021  ? Statin intolerance 12/12/2018  ? Vitamin D deficiency 09/21/2017  ? Insomnia 05/14/2017  ? Menopausal symptoms 01/04/2017  ? Urticaria 12/14/2016  ? Pain in both hands 12/14/2016  ? Essential hypertension 10/27/2016  ? Bilateral anterior knee pain 03/20/2016  ? DDD (degenerative disc disease), lumbosacral 11/15/2015  ? Low back pain radiating to both legs 11/15/2015  ? Left ureteral stone 10/27/2015  ? New onset of headaches after age 56 10/07/2015  ? Hyperlipidemia 07/15/2015  ? Type 2 diabetes mellitus ([place]) 03/04/2015  ? Chronic neck pain 09/11/2014  ? Cervical disc disorder with radiculopathy of cervical region 09/11/2014  ? Chronic radicular low back pain 09/11/2014  ? ?Past Surgical  History:  ?Procedure Laterality Date  ? ABDOMINAL HYSTERECTOMY    ? BACK SURGERY    ? CYSTOSCOPY WITH RETROGRADE PYELOGRAM, URETEROSCOPY AND STENT PLACEMENT Left 10/28/2015  ? Procedure: CYSTOSCOPY WITH RETROGRADE PYELOGRAM, URETEROSCOPY AND STENT PLACEMENT, AND LASER;  Surgeon: Irine Seal, MD;  Location: [facility];  Service: Urology;  Laterality: Left;  ? ?Family History  ?Problem Relation Age of Onset  ? Diabetes Mother   ? Hypertension Mother   ? Heart disease Mother   ?     based at age 56 of MI  ? Diabetes Father   ? Hypertension Father   ? Heart disease Father   ? Cancer Maternal Aunt   ?     liver  ? ?Outpatient Medications Prior to Visit  ?Medication Sig Dispense Refill  ? Accu-Chek FastClix Lancets MISC USE AS DIRECTED to check blood sugar once daily. 100 each 11  ? ACCU-CHEK GUIDE test strip USE TO CHECK BLOOD SUGAR ONCE DAILY 50 each 1  ? acetaminophen-codeine (TYLENOL #3) 300-30 MG tablet TAKE 1 TABLET BY MOUTH EVERY 12 HOURS AS NEEDED FOR MODERATE PAIN 60 tablet 1  ? aspirin EC 81 MG tablet Take 1 tablet (81 mg total) by mouth daily. 30 tablet 11  ? azelastine (ASTELIN) 0.1 % nasal spray Place 2 sprays into both nostrils 2 (two) times daily. 30 mL 0  ? Blood Glucose Monitoring Suppl (ACCU-CHEK GUIDE ME) w/Device KIT 1 each by Does not apply route daily. 1 kit 0  ? Elastic Bandages & Supports (TRUFORM ARM 13 M 15-20MMHG) MISC 1 each by Does  not apply route daily. 1 each 0  ? fluticasone (FLONASE) 50 MCG/ACT nasal spray Place 2 sprays into both nostrils daily. 1 g 0  ? hydrocortisone (ANUSOL-HC) 25 MG suppository Place 1 suppository (25 mg total) rectally 2 (two) times daily. 12 suppository 0  ? ibuprofen (ADVIL) 800 MG tablet TAKE 1 TABLET BY MOUTH EVERY 12 HOURS AS NEEDED 60 tablet 0  ? metFORMIN (GLUCOPHAGE) 500 MG tablet Take 1 tablet (500 mg total) by mouth 2 (two) times daily with a meal. 180 tablet 3  ? methocarbamol (ROBAXIN) 500 MG tablet Take 1 tablet (500 mg total) by mouth 2 (two) times daily  as needed for muscle spasms. 20 tablet 0  ? Misc. Devices (CANE) MISC 1 each by Does not apply route as needed. 1 each 0  ? pantoprazole (PROTONIX) 40 MG tablet Take 1 tablet (40 mg total) by mouth daily. 30 tablet 0  ? psyllium (METAMUCIL) 58.6 % powder Take 1 packet by mouth 2 (two) times daily.    ? cetirizine (ZYRTEC ALLERGY) 10 MG tablet Take 1 tablet (10 mg total) by mouth daily. (Patient not taking: Reported on 10/14/2021) 30 tablet 0  ? diclofenac Sodium (VOLTAREN) 1 % GEL Apply 4 g topically 4 (four) times daily. 100 g 1  ? ?No facility-administered medications prior to visit.  ? ?Allergies  ?Allergen Reactions  ? Atorvastatin Other (See Comments)  ?  myalgia  ? Glipizide Other (See Comments)  ?  Burning in stomach  ?  ?Objective:  ? ?Today's Vitals  ? 10/14/21 1123  ?BP: 124/70  ?Pulse: 77  ?Temp: (!) 97 ?F (36.1 ?C)  ?TempSrc: Temporal  ?SpO2: 97%  ?Weight: 171 lb 12.8 oz (77.9 kg)  ?Height: 5' 2.75" (1.594 m)  ? ?Body mass index is 30.68 kg/m?.  ? ?General: Well developed, well nourished. No acute distress. ?Abdomen: Soft. Mild diffuse tenderness, in left mid abdomen and epigastrium. Bowel sounds  ? positive, normal pitch and frequency. No hepatosplenomegaly. No rebound or guarding. ?Back: Straight. No CVA tenderness bilaterally. ?Extremities: Full ROM. No joint swelling or tenderness. No edema noted. ?Skin: Warm and dry. No rashes. ?Neuro: CN II-XII intact. Normal sensation and DTR bilaterally. ?Psych: Alert and oriented. Normal mood and affect. ? ?Health Maintenance Due  ?Topic Date Due  ? Zoster Vaccines- Shingrix (1 of 2) Never done  ? ?Lab Results ?Last CBC ?Lab Results  ?Component Value Date  ? WBC 10.9 (H) 10/09/2021  ? HGB 14.9 10/09/2021  ? HCT 43.4 10/09/2021  ? MCV 90.8 10/09/2021  ? MCH 31.2 10/09/2021  ? RDW 11.6 10/09/2021  ? PLT 254 10/09/2021  ? ?Last metabolic panel ?Lab Results  ?Component Value Date  ? GLUCOSE 218 (H) 10/09/2021  ? NA 136 10/09/2021  ? K 4.2 10/09/2021  ? CL 105  10/09/2021  ? CO2 22 10/09/2021  ? BUN 11 10/09/2021  ? CREATININE 0.57 10/09/2021  ? GFRNONAA >60 10/09/2021  ? CALCIUM 9.0 10/09/2021  ? PROT 7.7 10/09/2021  ? ALBUMIN 4.0 10/09/2021  ? LABGLOB 2.8 02/12/2021  ? AGRATIO 1.6 02/12/2021  ? BILITOT 1.5 (H) 10/09/2021  ? ALKPHOS 52 10/09/2021  ? AST 33 10/09/2021  ? ALT 43 10/09/2021  ? ANIONGAP 9 10/09/2021  ? ? Lipase  ?   ?Component Value Date/Time  ? LIPASE 37 10/09/2021 0526  ?   ?Component Ref Range & Units 5 d ago ?(10/09/21)  ?Color, Urine YELLOW STRAW Abnormal    ?APPearance CLEAR CLEAR   ?Specific Gravity,  Urine 1.005 - 1.030 1.040 High    ?pH 5.0 - 8.0 6.0   ?Glucose, UA NEGATIVE mg/dL 150 Abnormal    ?Hgb urine dipstick NEGATIVE NEGATIVE   ?Bilirubin Urine NEGATIVE NEGATIVE   ?Ketones, ur NEGATIVE mg/dL NEGATIVE   ?Protein, ur NEGATIVE mg/dL NEGATIVE   ?Nitrite NEGATIVE NEGATIVE   ?Leukocytes,Ua NEGATIVE NEGATIVE   ? ?Imaging: ?CT of Abdomen and Pelvis w contrast (10/09/2021) ?IMPRESSION: ?1. No acute abnormality in the abdomen or pelvis. ?2. Moderate to marked hepatic steatosis. ?3. Small fat containing umbilical hernia. ?4. Aortic atherosclerosis. ? ?US Abdomen Limited RUQ (10/09/2021) ?IMPRESSION: ?Probable fatty infiltration of liver as above. ?  ?Otherwise negative exam. ? ?Assessment & Plan:  ? ?1. Epigastric pain ?The etiology of the symptoms is not clear, but has been improving on a PPI. I recommend she continue taking this. I will plant o refer her to GI to consider a possible upper endoscopy +/- a colonoscopy. ? ?- Ambulatory referral to Gastroenterology ? ?Return if symptoms worsen or fail to improve.  ? ?Haydee Salter, MD ?

## 2021-10-21 ENCOUNTER — Other Ambulatory Visit: Payer: Self-pay | Admitting: Family Medicine

## 2021-10-21 ENCOUNTER — Encounter: Payer: Self-pay | Admitting: Internal Medicine

## 2021-10-21 ENCOUNTER — Ambulatory Visit: Payer: Medicare HMO | Admitting: Internal Medicine

## 2021-10-21 VITALS — BP 131/84 | HR 72 | Ht 62.0 in | Wt 172.2 lb

## 2021-10-21 DIAGNOSIS — G8929 Other chronic pain: Secondary | ICD-10-CM

## 2021-10-21 DIAGNOSIS — M5010 Cervical disc disorder with radiculopathy, unspecified cervical region: Secondary | ICD-10-CM

## 2021-10-21 DIAGNOSIS — Z8249 Family history of ischemic heart disease and other diseases of the circulatory system: Secondary | ICD-10-CM | POA: Diagnosis not present

## 2021-10-21 DIAGNOSIS — E785 Hyperlipidemia, unspecified: Secondary | ICD-10-CM | POA: Diagnosis not present

## 2021-10-21 DIAGNOSIS — E119 Type 2 diabetes mellitus without complications: Secondary | ICD-10-CM

## 2021-10-21 DIAGNOSIS — I700 Atherosclerosis of aorta: Secondary | ICD-10-CM

## 2021-10-21 MED ORDER — EZETIMIBE 10 MG PO TABS: 10.0000 mg | ORAL_TABLET | Freq: Every day | ORAL | 3 refills | Status: DC

## 2021-10-21 NOTE — Patient Instructions (Signed)
Medication Instructions:  START zetia 10mg  daily  *If you need a refill on your cardiac medications before your next appointment, please call your pharmacy*   Lab Work: FASTING lab work to check cholesterol in 3-4 months -- complete a week before next appointment   If you have labs (blood work) drawn today and your tests are completely normal, you will receive your results only by: MyChart Message (if you have MyChart) OR A paper copy in the mail If you have any lab test that is abnormal or we need to change your treatment, we will call you to review the results.   Follow-Up: At [facility], you and your health needs are our priority.  As part of our continuing mission to provide you with exceptional heart care, we have created designated Provider Care Teams.  These Care Teams include your primary Cardiologist (physician) and Advanced Practice Providers (APPs -  Physician Assistants and Nurse Practitioners) who all work together to provide you with the care you need, when you need it.  We recommend signing up for the patient portal called "MyChart".  Sign up information is provided on this After Visit Summary.  MyChart is used to connect with patients for Virtual Visits (Telemedicine).  Patients are able to view lab/test results, encounter notes, upcoming appointments, etc.  Non-urgent messages can be sent to your provider as well.   To learn more about what you can do with MyChart, go to [facility].    Your next appointment:    3-4 months with [email or web address] -- lipid clinic - OK to use DOD - [facility] or [facility]

## 2021-10-21 NOTE — Progress Notes (Signed)
LIPID CLINIC CONSULT NOTE  Chief Complaint:  Manage dyslipidemia  Primary Care Physician: Haydee Salter, MD  Primary Cardiologist:  Evalina Field, MD  HPI:  Jill Anderson is a 56 y.o. female who is being seen today for the evaluation of dyslipidemia at the request of Rudd, Lillette Boxer, MD. this is a pleasant 56 year old female kindly referred for evaluation management of dyslipidemia.  She was previously seen by Dr. Audie Box in 2021 for high cholesterol.  He noted that she was intolerant to atorvastatin and Zetia however she did not recall intolerance to the Zetia.  He felt that she should try Crestor.  However even low-dose rosuvastatin 5 mg caused significant pain and cramping.  He ordered lower extremity arterial Dopplers at the time, which showed normal bilateral ABIs and no evidence of obstructive disease.  She also has type 2 diabetes with recent hemoglobin A1c 7.2% in target LDL therefore less than 70.  Most recent lipid showed total cholesterol 228, triglycerides 153, HDL 57 and LDL 139.  She has had some recent GI issues and was seen in the hospital for stomach pain and thought to possibly have a gastric ulcer.  She does have GI follow-up and has been started on Protonix.  She reports trying to reduce saturated fats in her diet.  There is a strong family history of heart disease including her mother who apparently died in her 50s of heart attack.  PMHx:  Past Medical History:  Diagnosis Date   Borderline diabetic    Chronic back pain    Diabetes mellitus without complication ([place])    Hypercholesteremia     Past Surgical History:  Procedure Laterality Date   ABDOMINAL HYSTERECTOMY     BACK SURGERY     CYSTOSCOPY WITH RETROGRADE PYELOGRAM, URETEROSCOPY AND STENT PLACEMENT Left 10/28/2015   Procedure: CYSTOSCOPY WITH RETROGRADE PYELOGRAM, URETEROSCOPY AND STENT PLACEMENT, AND LASER;  Surgeon: Irine Seal, MD;  Location: [facility];  Service: Urology;  Laterality: Left;    FAMHx:   Family History  Problem Relation Age of Onset   Diabetes Mother    Hypertension Mother    Heart disease Mother        based at age 78 of MI   Diabetes Father    Hypertension Father    Heart disease Father    Cancer Maternal Aunt        liver    SOCHx:   reports that she has never smoked. She has never used smokeless tobacco. She reports that she does not drink alcohol and does not use drugs.  ALLERGIES:  Allergies  Allergen Reactions   Atorvastatin Other (See Comments)    myalgia   Glipizide Other (See Comments)    Burning in stomach    ROS: Pertinent items noted in HPI and remainder of comprehensive ROS otherwise negative.  HOME MEDS: Current Outpatient Medications on File Prior to Visit  Medication Sig Dispense Refill   Accu-Chek FastClix Lancets MISC USE AS DIRECTED to check blood sugar once daily. 100 each 11   ACCU-CHEK GUIDE test strip USE TO CHECK BLOOD SUGAR ONCE DAILY 50 each 1   acetaminophen-codeine (TYLENOL #3) 300-30 MG tablet TAKE 1 TABLET BY MOUTH EVERY 12 HOURS AS NEEDED FOR MODERATE PAIN 60 tablet 1   aspirin EC 81 MG tablet Take 1 tablet (81 mg total) by mouth daily. 30 tablet 11   azelastine (ASTELIN) 0.1 % nasal spray Place 2 sprays into both nostrils 2 (two) times daily.  30 mL 0   Blood Glucose Monitoring Suppl (ACCU-CHEK GUIDE ME) w/Device KIT 1 each by Does not apply route daily. 1 kit 0   Elastic Bandages & Supports (TRUFORM ARM SLEEVE M 15-20MMHG) MISC 1 each by Does not apply route daily. 1 each 0   fluticasone (FLONASE) 50 MCG/ACT nasal spray Place 2 sprays into both nostrils daily. 1 g 0   hydrocortisone (ANUSOL-HC) 25 MG suppository Place 1 suppository (25 mg total) rectally 2 (two) times daily. 12 suppository 0   ibuprofen (ADVIL) 800 MG tablet TAKE 1 TABLET BY MOUTH EVERY 12 HOURS AS NEEDED 60 tablet 0   metFORMIN (GLUCOPHAGE) 500 MG tablet Take 1 tablet (500 mg total) by mouth 2 (two) times daily with a meal. 180 tablet 3   methocarbamol  (ROBAXIN) 500 MG tablet Take 1 tablet (500 mg total) by mouth 2 (two) times daily as needed for muscle spasms. 20 tablet 0   Misc. Devices (CANE) MISC 1 each by Does not apply route as needed. 1 each 0   pantoprazole (PROTONIX) 40 MG tablet Take 1 tablet (40 mg total) by mouth daily. 30 tablet 0   psyllium (METAMUCIL) 58.6 % powder Take 1 packet by mouth 2 (two) times daily.     No current facility-administered medications on file prior to visit.    LABS/IMAGING: No results found for this or any previous visit (from the past 48 hour(s)). No results found.  LIPID PANEL:    Component Value Date/Time   CHOL 228 (H) 08/06/2021 1352   CHOL 230 (H) 02/12/2021 1041   TRIG 153.0 (H) 08/06/2021 1352   HDL 57.80 08/06/2021 1352   HDL 48 02/12/2021 1041   CHOLHDL 4 08/06/2021 1352   VLDL 30.6 08/06/2021 1352   LDLCALC 139 (H) 08/06/2021 1352   LDLCALC 159 (H) 02/12/2021 1041    WEIGHTS: Wt Readings from Last 3 Encounters:  10/21/21 172 lb 3.2 oz (78.1 kg)  10/14/21 171 lb 12.8 oz (77.9 kg)  10/01/21 175 lb 12.8 oz (79.7 kg)    VITALS: BP 131/84   Pulse 72   Ht 5' 2" (1.575 m)   Wt 172 lb 3.2 oz (78.1 kg)   SpO2 98%   BMI 31.50 kg/m   EXAM: General appearance: alert and no distress Neck: no carotid bruit, no JVD, and thyroid not enlarged, symmetric, no tenderness/mass/nodules Lungs: clear to auscultation bilaterally Heart: regular rate and rhythm, S1, S2 normal, no murmur, click, rub or gallop Abdomen: soft, non-tender; bowel sounds normal; no masses,  no organomegaly Extremities: extremities normal, atraumatic, no cyanosis or edema Pulses: 2+ and symmetric Skin: Skin color, texture, turgor normal. No rashes or lesions Neurologic: Grossly normal Psych: Pleasant  *Examination chaperoned by Sheral Apley, RN.  EKG: N/A  ASSESSMENT: Mixed dyslipidemia, goal LDL less than 70 Statin intolerant-myalgias Type 2 diabetes-A1c 7.2% Aortic atherosclerosis on imaging Family  history of premature coronary artery disease in her mother  PLAN: 1.   Jill Anderson has a mixed dyslipidemia and has been statin intolerant.  Her goal LDL is less than 70 given diabetes, aortic atherosclerosis and family history of early onset heart disease.  She will likely need multiple therapies to reach target.  I am not certain that she was intolerant of ezetimibe rather she was using it in combination with the statin and probably had side effects related to that.  I would like to retry her on ezetimibe which I think will be well-tolerated.  She has had some recent GI  issues and possible gastric ulcer but is scheduled to see GI for endoscopy and further work-up soon.  If the Zetia is tolerated we will plan repeat lipids in about 3 months.  Ultimately if she remains above target, she may need a PCSK9 inhibitor.  There is a strong family history of early onset heart disease in her mother who is also diabetic and had high cholesterol.  Thanks again for the kind referral.  Pixie Casino, MD, FACC, [place] Director of the Advanced Lipid Disorders &  Cardiovascular Risk Reduction Clinic Diplomate of the American Board of Clinical Lipidology Attending Cardiologist  Direct Dial: [phone]  Fax: [phone]  Website:  [email or web address] 10/21/2021, 1:03 PM

## 2021-10-22 MED ORDER — ACETAMINOPHEN-CODEINE 300-30 MG PO TABS: 1.0000 | ORAL_TABLET | Freq: Three times a day (TID) | ORAL | 0 refills | Status: DC | PRN

## 2021-10-22 NOTE — Addendum Note (Signed)
Addended by: Loyola Mast on: 10/22/2021 05:39 PM   Modules accepted: Orders

## 2021-10-22 NOTE — Telephone Encounter (Signed)
Requested medication (s) are due for refill today: yes  Requested medication (s) are on the active medication list: yes    Last refill: 04/22/21  #60 1 refill  Future visit scheduled no  Notes to clinic: Not delegated, please review. Thank you.  Requested Prescriptions  Pending Prescriptions Disp Refills   acetaminophen-codeine (TYLENOL #3) 300-30 MG tablet [Pharmacy Med Name: Acetaminophen-Codeine #3 300-30 MG Oral Tablet] 60 tablet 0    Sig: TAKE 1 TABLET BY MOUTH EVERY 12 HOURS AS NEEDED FOR MODERATE PAIN     Not Delegated - Analgesics:  Opioid Agonist Combinations 2 Failed - 10/21/2021 12:44 PM      Failed - This refill cannot be delegated      Failed - Urine Drug Screen completed in last 360 days      Failed - Valid encounter within last 3 months    Recent Outpatient Visits           8 months ago Encounter for [employer] annual wellness exam   [place], [place], MD   1 year ago Type 2 diabetes mellitus with other specified complication, without long-term current use of insulin ([place])   [place], [place], MD   1 year ago Type 2 diabetes mellitus with other specified complication, without long-term current use of insulin ([place])   [place], [place], MD   1 year ago Type 2 diabetes mellitus with other specified complication, without long-term current use of insulin ([place])   [place], [place], MD   2 years ago Cervical disc disorder with radiculopathy of cervical region   [place], Charlane Ferretti, MD       Future Appointments             In 3 months Hilty, Nadean Corwin, MD [facility], [facility]             Passed - Cr in normal range and within 360 days    Creat  Date Value Ref Range Status  07/12/2015 0.65 0.50 - 1.05 mg/dL Final   Creatinine, Ser  Date Value Ref Range  Status  10/09/2021 0.57 0.44 - 1.00 mg/dL Final   Creatinine,U  Date Value Ref Range Status  08/06/2021 132.7 mg/dL Final   Creatinine, Urine  Date Value Ref Range Status  03/20/2016 53 20 - 320 mg/dL Final         Passed - eGFR is 10 or above and within 360 days    GFR, Est African American  Date Value Ref Range Status  07/12/2015 >89 >=60 mL/min Final   GFR calc Af Amer  Date Value Ref Range Status  12/19/2019 101 >59 mL/min/1.73 Final    Comment:    **[facility] currently reports eGFR in compliance with the current**   recommendations of the [employer]. [facility] will   update reporting as new guidelines are published from the NKF-ASN   Task force.    GFR, Est Non African American  Date Value Ref Range Status  07/12/2015 >89 >=60 mL/min Final    Comment:      The estimated GFR is a calculation valid for adults (>=31 years old) that uses the CKD-EPI algorithm to adjust for age and sex. It is   not to be used for children, pregnant women, hospitalized patients,    patients on dialysis, or with rapidly changing kidney function. According  to the NKDEP, eGFR >89 is normal, 60-89 shows mild impairment, 30-59 shows moderate impairment, 15-29 shows severe impairment and <15 is ESRD.      GFR, Estimated  Date Value Ref Range Status  10/09/2021 >60 >60 mL/min Final    Comment:    (NOTE) Calculated using the CKD-EPI Creatinine Equation (2021)    GFR  Date Value Ref Range Status  08/06/2021 97.99 >60.00 mL/min Final    Comment:    Calculated using the CKD-EPI Creatinine Equation (2021)   eGFR  Date Value Ref Range Status  02/12/2021 93 >59 mL/min/1.73 Final         Passed - Patient is not pregnant          

## 2021-11-07 ENCOUNTER — Ambulatory Visit: Payer: Medicare HMO | Admitting: Gastroenterology

## 2021-11-07 ENCOUNTER — Encounter: Payer: Self-pay | Admitting: Gastroenterology

## 2021-11-07 VITALS — BP 132/80 | HR 78 | Ht 62.0 in | Wt 171.0 lb

## 2021-11-07 DIAGNOSIS — Z1211 Encounter for screening for malignant neoplasm of colon: Secondary | ICD-10-CM

## 2021-11-07 DIAGNOSIS — K760 Fatty (change of) liver, not elsewhere classified: Secondary | ICD-10-CM

## 2021-11-07 DIAGNOSIS — R1013 Epigastric pain: Secondary | ICD-10-CM

## 2021-11-07 NOTE — Progress Notes (Signed)
Referring Provider: Haydee Salter, MD Primary Care Physician:  Haydee Salter, MD  Reason for Consultation:  Epigastric pain   IMPRESSION:  Epigastric pain/discomfort and nausea not explained by recent ultrasound or CT with incomplete relief despite PPI therapy. EGD recommended for further evaluation.   Fatty liver by CT and ultrasound. Suspected fatty liver. Plan labs and staging information in the future after epigastric pain has been evaluated and successfully treated.   Need for colon cancer screening. No known family history of colon cancer or polyps.     PLAN: - Continue pantoprazole 40 mg QAM - EGD to evaluate epigastric pain - Avoid NSAIDs - Colonoscopy for colon cancer screening  HPI: Jill Anderson is a 56 y.o. female referred by Dr. Marian Sorrow for further evaluation of epigastric pain.  The history is obtained through the patient and review of her electronic health record. She is disabled due to back and neck pain.  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   She has had 3 weeks of nausea, vomiting, pain in her chest, and diarrhea. Symptoms severe enough that she was seen in the ED 10/09/2021.  Labs, CT, and ultrasound were nondiagnostic.  She was discharged home with a prescription for PPI.  Vomiting and loose stools have resolved.  However, she continues to feel mild nausea and epigastric discomfort. Appetite is good. Weight is stable.   Occasional use of NSAIDs.  She wondered if her symptoms were due to the gallbladder.   EGD in 2013 performed in [place] for nausea and epigastric pain was normal.  Gastric biopsies showed severe gastritis but no H pylori. Symptoms were similar at that time.  Colonoscopy over 10 years ago.   There is no known family history of colon cancer or polyps. No family history of stomach cancer or other GI malignancy. No family history of inflammatory bowel disease or celiac.   CT of the abdomen and pelvis with contrast 10/09/2021  showed no acute abnormalities.  There is marked hepatic steatosis, a small fat-containing umbilical hernia, and aortic atherosclerosis  Abdominal ultrasound 10/09/2021 showed fatty liver.  Was otherwise negative. Past Medical History:  Diagnosis Date   Borderline diabetic    Chronic back pain    Diabetes mellitus without complication ([place])    Hypercholesteremia     Past Surgical History:  Procedure Laterality Date   ABDOMINAL HYSTERECTOMY     BACK SURGERY     CYSTOSCOPY WITH RETROGRADE PYELOGRAM, URETEROSCOPY AND STENT PLACEMENT Left 10/28/2015   Procedure: CYSTOSCOPY WITH RETROGRADE PYELOGRAM, URETEROSCOPY AND STENT PLACEMENT, AND LASER;  Surgeon: Irine Seal, MD;  Location: [facility];  Service: Urology;  Laterality: Left;      Current Outpatient Medications  Medication Sig Dispense Refill   Accu-Chek FastClix Lancets MISC USE AS DIRECTED to check blood sugar once daily. 100 each 11   ACCU-CHEK GUIDE test strip USE TO CHECK BLOOD SUGAR ONCE DAILY 50 each 1   acetaminophen-codeine (TYLENOL #3) 300-30 MG tablet TAKE 1 TABLET BY MOUTH EVERY 12 HOURS AS NEEDED FOR MODERATE PAIN 60 tablet 1   acetaminophen-codeine (TYLENOL #3) 300-30 MG tablet Take 1 tablet by mouth every 8 (eight) hours as needed for moderate pain. 60 tablet 0   aspirin EC 81 MG tablet Take 1 tablet (81 mg total) by mouth daily. 30 tablet 11   azelastine (ASTELIN) 0.1 % nasal spray Place 2 sprays into both nostrils 2 (two) times daily. 30 mL 0   Blood Glucose Monitoring Suppl (ACCU-CHEK  GUIDE ME) w/Device KIT 1 each by Does not apply route daily. 1 kit 0   Elastic Bandages & Supports (TRUFORM ARM SLEEVE M 15-20MMHG) MISC 1 each by Does not apply route daily. 1 each 0   ezetimibe (ZETIA) 10 MG tablet Take 1 tablet (10 mg total) by mouth daily. 90 tablet 3   fluticasone (FLONASE) 50 MCG/ACT nasal spray Place 2 sprays into both nostrils daily. 1 g 0   hydrocortisone (ANUSOL-HC) 25 MG suppository Place 1 suppository (25 mg total)  rectally 2 (two) times daily. 12 suppository 0   ibuprofen (ADVIL) 800 MG tablet TAKE 1 TABLET BY MOUTH EVERY 12 HOURS AS NEEDED 60 tablet 0   metFORMIN (GLUCOPHAGE) 500 MG tablet Take 1 tablet (500 mg total) by mouth 2 (two) times daily with a meal. 180 tablet 3   methocarbamol (ROBAXIN) 500 MG tablet Take 1 tablet (500 mg total) by mouth 2 (two) times daily as needed for muscle spasms. 20 tablet 0   Misc. Devices (CANE) MISC 1 each by Does not apply route as needed. 1 each 0   pantoprazole (PROTONIX) 40 MG tablet Take 1 tablet (40 mg total) by mouth daily. 30 tablet 0   psyllium (METAMUCIL) 58.6 % powder Take 1 packet by mouth 2 (two) times daily.     No current facility-administered medications for this visit.    Allergies as of 11/07/2021 - Review Complete 11/07/2021  Allergen Reaction Noted   Atorvastatin Other (See Comments) 08/12/2017   Glipizide Other (See Comments) 10/01/2021    Family History  Problem Relation Age of Onset   Diabetes Mother    Hypertension Mother    Heart disease Mother        based at age 55 of MI   Diabetes Father    Hypertension Father    Heart disease Father    Cancer Maternal Aunt        liver   Colon cancer Neg Hx    Esophageal cancer Neg Hx    Stomach cancer Neg Hx     Social History   Socioeconomic History   Marital status: Single    Spouse name: Not on file   Number of children: 3   Years of education: Not on file   Highest education level: Not on file  Occupational History   Occupation: Disabled  Tobacco Use   Smoking status: Never   Smokeless tobacco: Never  Vaping Use   Vaping Use: Never used  Substance and Sexual Activity   Alcohol use: No   Drug use: No   Sexual activity: Not Currently  Other Topics Concern   Not on file  Social History Narrative   Not on file   Social Determinants of Health   Financial Resource Strain: Not on file  Food Insecurity: Not on file  Transportation Needs: Not on file  Physical Activity:  Not on file  Stress: Not on file  Social Connections: Not on file  Intimate Partner Violence: Not on file    Review of Systems: 12 system ROS is negative except as noted above with the addition of back pain, sleeping problems, and urine leakage.   Physical Exam: General:   Alert,  well-nourished, pleasant and cooperative in NAD Head:  Normocephalic and atraumatic. Eyes:  Sclera clear, no icterus.   Conjunctiva pink. Ears:  Normal auditory acuity. Nose:  No deformity, discharge,  or lesions. Mouth:  No deformity or lesions.   Neck:  Supple; no masses or thyromegaly. Lungs:  Clear throughout to auscultation.   No wheezes. Heart:  Regular rate and rhythm; no murmurs. Abdomen:  Soft, nontender, nondistended, normal bowel sounds, no rebound or guarding. No hepatosplenomegaly.   Rectal:  Deferred  Msk:  Symmetrical. No boney deformities LAD: No inguinal or umbilical LAD Extremities:  No clubbing or edema. Neurologic:  Alert and  oriented x4;  grossly nonfocal Skin:  Intact without significant lesions or rashes. Psych:  Alert and cooperative. Normal mood and affect.    Cris Talavera L. Tarri Glenn, MD, MPH 11/07/2021, 2:45 PM

## 2021-11-07 NOTE — Patient Instructions (Addendum)
It was my pleasure to provide care to you today. Based on our discussion, I am providing you with my recommendations below:  RECOMMENDATION(S):   COLONOSCOPY:   You have been scheduled for a colonoscopy. Please follow written instructions given to you at your visit today.   PREP:   Please pick up your prep supplies at the pharmacy within the next 1-3 days.  INHALERS:   If you use inhalers (even only as needed), please bring them with you on the day of your procedure.   COLONOSCOPY TIPS:  To reduce nausea and dehydration, stay well hydrated for 3-4 days prior to the exam.  To prevent skin/hemorrhoid irritation - prior to wiping, put A&Dointment or vaseline on the toilet paper. Keep a towel or pad on the bed.  BEFORE STARTING YOUR PREP, drink  64oz of clear liquids in the morning. This will help to flush the colon and will ensure you are well hydrated!!!!  NOTE - This is in addition to the fluids required for to complete your prep. Use of a flavored hard candy, such as grape Rubin Payor, can counteract some of the flavor of the prep and may prevent some nausea.    FOLLOW UP:  After your procedure, you will receive a call from my office staff regarding my recommendation for follow up.  BMI:  If you are age 57 or older, your body mass index should be between 23-30. Your Body mass index is 31.28 kg/m. If this is out of the aforementioned range listed, please consider follow up with your Primary Care Provider.  If you are age 30 or younger, your body mass index should be between 19-25. Your Body mass index is 31.28 kg/m. If this is out of the aformentioned range listed, please consider follow up with your Primary Care Provider.   MY CHART:  The [facility] providers would like to encourage you to use [facility] to communicate with providers for non-urgent requests or questions.  Due to long hold times on the telephone, sending your provider a message by [facility] may be a faster and  more efficient way to get a response.  Please allow 48 business hours for a response.  Please remember that this is for non-urgent requests.   Thank you for trusting me with your gastrointestinal care!    Tressia Danas, MD, MPH

## 2021-12-18 ENCOUNTER — Telehealth: Payer: Self-pay | Admitting: Family Medicine

## 2021-12-18 NOTE — Telephone Encounter (Signed)
Called patient and she states she didn't call this morning.  Dm/cma

## 2021-12-18 NOTE — Telephone Encounter (Signed)
Pt wants to know if you have a statin recommendation?

## 2021-12-25 ENCOUNTER — Encounter: Payer: Medicare HMO | Admitting: Gastroenterology

## 2021-12-30 ENCOUNTER — Other Ambulatory Visit: Payer: Self-pay | Admitting: Family Medicine

## 2021-12-30 DIAGNOSIS — M5010 Cervical disc disorder with radiculopathy, unspecified cervical region: Secondary | ICD-10-CM

## 2021-12-30 DIAGNOSIS — G8929 Other chronic pain: Secondary | ICD-10-CM

## 2022-01-20 ENCOUNTER — Ambulatory Visit: Payer: Medicare HMO | Admitting: Family Medicine

## 2022-01-21 ENCOUNTER — Encounter: Payer: Self-pay | Admitting: Family Medicine

## 2022-01-21 ENCOUNTER — Ambulatory Visit (INDEPENDENT_AMBULATORY_CARE_PROVIDER_SITE_OTHER): Payer: Medicare HMO | Admitting: Family Medicine

## 2022-01-21 VITALS — BP 146/82 | HR 72 | Temp 97.2°F | Ht 62.0 in | Wt 166.2 lb

## 2022-01-21 DIAGNOSIS — I10 Essential (primary) hypertension: Secondary | ICD-10-CM | POA: Diagnosis not present

## 2022-01-21 DIAGNOSIS — R82998 Other abnormal findings in urine: Secondary | ICD-10-CM

## 2022-01-21 DIAGNOSIS — E782 Mixed hyperlipidemia: Secondary | ICD-10-CM

## 2022-01-21 DIAGNOSIS — K760 Fatty (change of) liver, not elsewhere classified: Secondary | ICD-10-CM | POA: Diagnosis not present

## 2022-01-21 DIAGNOSIS — E1165 Type 2 diabetes mellitus with hyperglycemia: Secondary | ICD-10-CM | POA: Diagnosis not present

## 2022-01-21 LAB — POCT URINALYSIS DIPSTICK
Blood, UA: NEGATIVE
Glucose, UA: NEGATIVE
Ketones, UA: NEGATIVE
Leukocytes, UA: NEGATIVE
Nitrite, UA: NEGATIVE
Protein, UA: NEGATIVE
Spec Grav, UA: 1.025 (ref 1.010–1.025)
Urobilinogen, UA: 0.2 E.U./dL
pH, UA: 6 (ref 5.0–8.0)

## 2022-01-21 MED ORDER — LISINOPRIL 5 MG PO TABS: 5.0000 mg | ORAL_TABLET | Freq: Every day | ORAL | 3 refills | Status: DC

## 2022-01-21 NOTE — Progress Notes (Signed)
[facility] [address] [place] [address] Dept: [phone] Dept Fax: [phone]  Chronic Care Office Visit  Subjective:    Patient ID: Amely Voorheis, female    DOB: 01-Jul-1965, 56 y.o..   MRN: [id number]  Chief Complaint  Patient presents with   Acute Visit    C/o having dark colored urine with smell x 1 week.   Also wants to get blood work done today.     History of Present Illness:  Patient is in today for reassessment of chronic medical issues.  Ms. Avena has a history of Type 2 diabetes. She is managed on metformin 500 mg bid. She notes that her blood sugars have improved with more consistent use of the metformin. She is seeing blood sugars in the 95-114 range.   Ms. Gilkerson  has a history of hypertension. She is not currently on an antihypertensive. She notes she does check her pressures at home and they have been running around 140/80.   Ms. Cansler has a history of hyperlipidemia. She has been unable to tolerate statins due to myalgias. She is not currently on any lipid lowering therapy. She has a pending appointment at the [facility] with Dr. Debara Pickett.  Ms. Olvera notes that her urine has looked dark yellow to her. She denies any dysuria. She feels it has a strong odor as well. She thought this might be sing of an infection.  Ms. Clagett was evaluated this spring related to some abdominal discomfort. It was felt that this was due to GERD. She did see Dr. Tarri Glenn (GI). As part of her evaluation, she had scans that showed some hepatic steatosis. Dr. Tarri Glenn plans further evaluation of this.  Past Medical History: Patient Active Problem List   Diagnosis Date Noted   Hepatic steatosis 10/14/2021   Aortic atherosclerosis ([place]) 85/92/9244   Umbilical hernia 62/86/3817   Hemorrhoids 08/06/2021   Allergic rhinitis 08/06/2021   Statin intolerance 12/12/2018   Vitamin D deficiency 09/21/2017   Insomnia 05/14/2017    Menopausal symptoms 01/04/2017   Urticaria 12/14/2016   Pain in both hands 12/14/2016   Essential hypertension 10/27/2016   Bilateral anterior knee pain 03/20/2016   DDD (degenerative disc disease), lumbosacral 11/15/2015   Low back pain radiating to both legs 11/15/2015   Left ureteral stone 10/27/2015   New onset of headaches after age 28 10/07/2015   Hyperlipidemia 07/15/2015   Type 2 diabetes mellitus ([place]) 03/04/2015   Chronic neck pain 09/11/2014   Cervical disc disorder with radiculopathy of cervical region 09/11/2014   Chronic radicular low back pain 09/11/2014   Past Surgical History:  Procedure Laterality Date   ABDOMINAL HYSTERECTOMY     BACK SURGERY     CYSTOSCOPY WITH RETROGRADE PYELOGRAM, URETEROSCOPY AND STENT PLACEMENT Left 10/28/2015   Procedure: CYSTOSCOPY WITH RETROGRADE PYELOGRAM, URETEROSCOPY AND STENT PLACEMENT, AND LASER;  Surgeon: Irine Seal, MD;  Location: [facility];  Service: Urology;  Laterality: Left;   Family History  Problem Relation Age of Onset   Diabetes Mother    Hypertension Mother    Heart disease Mother        based at age 58 of MI   Diabetes Father    Hypertension Father    Heart disease Father    Cancer Maternal Aunt        liver   Colon cancer Neg Hx    Esophageal cancer Neg Hx    Stomach cancer Neg Hx  Outpatient Medications Prior to Visit  Medication Sig Dispense Refill   Accu-Chek FastClix Lancets MISC USE AS DIRECTED to check blood sugar once daily. 100 each 11   ACCU-CHEK GUIDE test strip USE TO CHECK BLOOD SUGAR ONCE DAILY 50 each 1   acetaminophen-codeine (TYLENOL #3) 300-30 MG tablet TAKE 1 TABLET BY MOUTH EVERY 12 HOURS AS NEEDED FOR MODERATE PAIN 60 tablet 1   aspirin EC 81 MG tablet Take 1 tablet (81 mg total) by mouth daily. 30 tablet 11   azelastine (ASTELIN) 0.1 % nasal spray Place 2 sprays into both nostrils 2 (two) times daily. 30 mL 0   Blood Glucose Monitoring Suppl (ACCU-CHEK GUIDE ME) w/Device KIT 1 each by Does  not apply route daily. 1 kit 0   Elastic Bandages & Supports (TRUFORM ARM SLEEVE M 15-20MMHG) MISC 1 each by Does not apply route daily. 1 each 0   ezetimibe (ZETIA) 10 MG tablet Take 1 tablet (10 mg total) by mouth daily. 90 tablet 3   fluticasone (FLONASE) 50 MCG/ACT nasal spray Place 2 sprays into both nostrils daily. 1 g 0   hydrocortisone (ANUSOL-HC) 25 MG suppository Place 1 suppository (25 mg total) rectally 2 (two) times daily. 12 suppository 0   ibuprofen (ADVIL) 800 MG tablet TAKE 1 TABLET BY MOUTH EVERY 12 HOURS AS NEEDED 60 tablet 0   metFORMIN (GLUCOPHAGE) 500 MG tablet Take 1 tablet (500 mg total) by mouth 2 (two) times daily with a meal. 180 tablet 3   methocarbamol (ROBAXIN) 500 MG tablet Take 1 tablet (500 mg total) by mouth 2 (two) times daily as needed for muscle spasms. 20 tablet 0   Misc. Devices (CANE) MISC 1 each by Does not apply route as needed. 1 each 0   pantoprazole (PROTONIX) 40 MG tablet Take 1 tablet (40 mg total) by mouth daily. 30 tablet 0   psyllium (METAMUCIL) 58.6 % powder Take 1 packet by mouth 2 (two) times daily.     acetaminophen-codeine (TYLENOL #3) 300-30 MG tablet TAKE 1 TABLET BY MOUTH EVERY 8 HOURS AS NEEDED FOR MODERATE PAIN 60 tablet 0   No facility-administered medications prior to visit.   Allergies  Allergen Reactions   Atorvastatin Other (See Comments)    myalgia   Glipizide Other (See Comments)    Burning in stomach     Objective:   Today's Vitals   01/21/22 0921 01/21/22 0926  BP: (!) 148/80 (!) 146/82  Pulse: 72   Temp: (!) 97.2 F (36.2 C)   TempSrc: Temporal   SpO2: 99%   Weight: 166 lb 3.2 oz (75.4 kg)   Height: 5' 2"  (1.575 m)    Body mass index is 30.4 kg/m.   General: Well developed, well nourished. No acute distress. Psych: Alert and oriented. Normal mood and affect.  Health Maintenance Due  Topic Date Due   Zoster Vaccines- Shingrix (1 of 2) Never done   INFLUENZA VACCINE  12/30/2021   Lab Results Component  Ref Range & Units 09:29 (01/21/22)  Color, UA  dark yellow   Clarity, UA  clear   Glucose, UA Negative Negative   Bilirubin, UA  1+   Ketones, UA  neg   Spec Grav, UA 1.010 - 1.025 1.025   Blood, UA  neg'   pH, UA 5.0 - 8.0 6.0   Protein, UA Negative Negative   Urobilinogen, UA 0.2 or 1.0 E.U./dL 0.2   Nitrite, UA  neg   Leukocytes, UA Negative Negative  Assessment & Plan:   1. Type 2 diabetes mellitus with hyperglycemia, without long-term current use of insulin (HCC) We will check Ms. Raatz's A1c today. I anticipate that this will be at goal based on her home blood sugars. I will plan to continue her on metformin 500 mg bid.  - Hemoglobin A1c - Comprehensive metabolic panel  2. Essential hypertension Ms. Yamamoto's blood pressure is high today. I think we should try and start an antihypertensive. I recommend we start ehr on a low dose of an ACE-I.  - lisinopril (ZESTRIL) 5 MG tablet; Take 1 tablet (5 mg total) by mouth daily.  Dispense: 90 tablet; Refill: 3 - Comprehensive metabolic panel  3. Hepatic steatosis Dr. Tarri Glenn has plans for further assessment of this. I wonder if this could be involved with the finding of bilirubin in the urine today.  - Comprehensive metabolic panel  4. Mixed hyperlipidemia Pending appointment with Dr. Debara Pickett.  5. Dark urine Likely due to bilirubin in urine. No sign of a UTI. I will assess to see if she ahs any signs of active liver disease or hemolysis.  - POCT Urinalysis Dipstick - Comprehensive metabolic panel - CBC   Return in about 3 months (around 04/23/2022) for Reassessment.   Haydee Salter, MD

## 2022-01-27 ENCOUNTER — Ambulatory Visit (HOSPITAL_BASED_OUTPATIENT_CLINIC_OR_DEPARTMENT_OTHER): Payer: Medicare HMO | Admitting: Internal Medicine

## 2022-01-28 ENCOUNTER — Ambulatory Visit: Payer: Medicare HMO | Admitting: Internal Medicine

## 2022-02-05 ENCOUNTER — Other Ambulatory Visit (INDEPENDENT_AMBULATORY_CARE_PROVIDER_SITE_OTHER): Payer: Medicare HMO

## 2022-02-05 DIAGNOSIS — E1165 Type 2 diabetes mellitus with hyperglycemia: Secondary | ICD-10-CM

## 2022-02-05 DIAGNOSIS — K760 Fatty (change of) liver, not elsewhere classified: Secondary | ICD-10-CM | POA: Diagnosis not present

## 2022-02-05 DIAGNOSIS — I10 Essential (primary) hypertension: Secondary | ICD-10-CM | POA: Diagnosis not present

## 2022-02-05 DIAGNOSIS — E782 Mixed hyperlipidemia: Secondary | ICD-10-CM | POA: Diagnosis not present

## 2022-02-05 DIAGNOSIS — Z8249 Family history of ischemic heart disease and other diseases of the circulatory system: Secondary | ICD-10-CM | POA: Diagnosis not present

## 2022-02-05 DIAGNOSIS — R82998 Other abnormal findings in urine: Secondary | ICD-10-CM | POA: Diagnosis not present

## 2022-02-05 LAB — COMPREHENSIVE METABOLIC PANEL
ALT: 50 U/L — ABNORMAL HIGH (ref 0–35)
AST: 35 U/L (ref 0–37)
Albumin: 4.1 g/dL (ref 3.5–5.2)
Alkaline Phosphatase: 48 U/L (ref 39–117)
BUN: 13 mg/dL (ref 6–23)
CO2: 27 mEq/L (ref 19–32)
Calcium: 9.5 mg/dL (ref 8.4–10.5)
Chloride: 103 mEq/L (ref 96–112)
Creatinine, Ser: 0.75 mg/dL (ref 0.40–1.20)
GFR: 88.3 mL/min (ref >60.00)
Glucose, Bld: 107 mg/dL — ABNORMAL HIGH (ref 70–99)
Potassium: 4.2 mEq/L (ref 3.5–5.1)
Sodium: 138 mEq/L (ref 135–145)
Total Bilirubin: 1.1 mg/dL (ref 0.2–1.2)
Total Protein: 7.4 g/dL (ref 6.0–8.3)

## 2022-02-05 LAB — CBC
HCT: 40.1 % (ref 36.0–46.0)
Hemoglobin: 13.5 g/dL (ref 12.0–15.0)
MCHC: 33.8 g/dL (ref 30.0–36.0)
MCV: 92 fl (ref 78.0–100.0)
Platelets: 264 10*3/uL (ref 150.0–400.0)
RBC: 4.35 Mil/uL (ref 3.87–5.11)
RDW: 12.3 % (ref 11.5–15.5)
WBC: 4.7 10*3/uL (ref 4.0–10.5)

## 2022-02-05 LAB — HEMOGLOBIN A1C: Hgb A1c MFr Bld: 6.4 % (ref 4.6–6.5)

## 2022-02-07 LAB — NMR, LIPOPROFILE
Cholesterol, Total: 169 mg/dL (ref 100–199)
HDL Particle Number: 31.9 umol/L (ref >=30.5)
HDL-C: 52 mg/dL (ref >39)
LDL Particle Number: 1117 nmol/L — ABNORMAL HIGH (ref <1000)
LDL Size: 21.3 nm (ref >20.5)
LDL-C (NIH Calc): 103 mg/dL — ABNORMAL HIGH (ref 0–99)
LP-IR Score: 32 (ref <=45)
Small LDL Particle Number: 299 nmol/L (ref <=527)
Triglycerides: 75 mg/dL (ref 0–149)

## 2022-02-07 LAB — LIPOPROTEIN A (LPA): Lipoprotein (a): 25 nmol/L (ref <75.0)

## 2022-02-17 ENCOUNTER — Ambulatory Visit: Payer: Medicare HMO

## 2022-02-17 NOTE — Progress Notes (Signed)
Patient request to canncel to a conflict -L.[email or web address],LPN

## 2022-03-18 ENCOUNTER — Ambulatory Visit (INDEPENDENT_AMBULATORY_CARE_PROVIDER_SITE_OTHER): Payer: Medicare HMO

## 2022-03-18 VITALS — Ht 62.0 in | Wt 160.0 lb

## 2022-03-18 DIAGNOSIS — Z0000 Encounter for general adult medical examination without abnormal findings: Secondary | ICD-10-CM

## 2022-03-18 NOTE — Patient Instructions (Signed)
Jill Anderson , Thank you for taking time to come for your Medicare Wellness Visit. I appreciate your ongoing commitment to your health goals. Please review the following plan we discussed and let me know if I can assist you in the future.   These are the goals we discussed:  Goals      DIET - INCREASE WATER INTAKE     HEMOGLOBIN A1C < 7.0        This is a list of the screening recommended for you and due dates:  Health Maintenance  Topic Date Due   Zoster (Shingles) Vaccine (1 of 2) Never done   COVID-19 Vaccine (3 - Pfizer series) 11/27/2019   Flu Shot  Never done   Complete foot exam   02/12/2022   Mammogram  03/20/2022   Eye exam for diabetics  04/01/2022   Hemoglobin A1C  08/06/2022   Yearly kidney health urinalysis for diabetes  08/07/2022   Yearly kidney function blood test for diabetes  02/06/2023   Colon Cancer Screening  06/02/2023   Tetanus Vaccine  09/30/2026   Hepatitis C Screening: USPSTF Recommendation to screen - Ages 18-79 yo.  Completed   HIV Screening  Completed   HPV Vaccine  Aged Out    Advanced directives: Advance directive discussed with you today. I have provided a copy for you to complete at home and have notarized. Once this is complete please bring a copy in to our office so we can scan it into your chart.   Conditions/risks identified: Aim for 30 minutes of exercise or brisk walking, 6-8 glasses of water, and 5 servings of fruits and vegetables each day.   Next appointment: Follow up in one year for your annual wellness visit.   Preventive Care 40-64 Years, Female Preventive care refers to lifestyle choices and visits with your health care provider that can promote health and wellness. What does preventive care include? A yearly physical exam. This is also called an annual well check. Dental exams once or twice a year. Routine eye exams. Ask your health care provider how often you should have your eyes checked. Personal lifestyle choices,  including: Daily care of your teeth and gums. Regular physical activity. Eating a healthy diet. Avoiding tobacco and drug use. Limiting alcohol use. Practicing safe sex. Taking low-dose aspirin daily starting at age 2. Taking vitamin and mineral supplements as recommended by your health care provider. What happens during an annual well check? The services and screenings done by your health care provider during your annual well check will depend on your age, overall health, lifestyle risk factors, and family history of disease. Counseling  Your health care provider may ask you questions about your: Alcohol use. Tobacco use. Drug use. Emotional well-being. Home and relationship well-being. Sexual activity. Eating habits. Work and work Statistician. Method of birth control. Menstrual cycle. Pregnancy history. Screening  You may have the following tests or measurements: Height, weight, and BMI. Blood pressure. Lipid and cholesterol levels. These may be checked every 5 years, or more frequently if you are over 63 years old. Skin check. Lung cancer screening. You may have this screening every year starting at age 66 if you have a 30-pack-year history of smoking and currently smoke or have quit within the past 15 years. Fecal occult blood test (FOBT) of the stool. You may have this test every year starting at age 9. Flexible sigmoidoscopy or colonoscopy. You may have a sigmoidoscopy every 5 years or a colonoscopy every 10 years  starting at age 80. Hepatitis C blood test. Hepatitis B blood test. Sexually transmitted disease (STD) testing. Diabetes screening. This is done by checking your blood sugar (glucose) after you have not eaten for a while (fasting). You may have this done every 1-3 years. Mammogram. This may be done every 1-2 years. Talk to your health care provider about when you should start having regular mammograms. This may depend on whether you have a family history of  breast cancer. BRCA-related cancer screening. This may be done if you have a family history of breast, ovarian, tubal, or peritoneal cancers. Pelvic exam and Pap test. This may be done every 3 years starting at age 48. Starting at age 29, this may be done every 5 years if you have a Pap test in combination with an HPV test. Bone density scan. This is done to screen for osteoporosis. You may have this scan if you are at high risk for osteoporosis. Discuss your test results, treatment options, and if necessary, the need for more tests with your health care provider. Vaccines  Your health care provider may recommend certain vaccines, such as: Influenza vaccine. This is recommended every year. Tetanus, diphtheria, and acellular pertussis (Tdap, Td) vaccine. You may need a Td booster every 10 years. Zoster vaccine. You may need this after age 43. Pneumococcal 13-valent conjugate (PCV13) vaccine. You may need this if you have certain conditions and were not previously vaccinated. Pneumococcal polysaccharide (PPSV23) vaccine. You may need one or two doses if you smoke cigarettes or if you have certain conditions. Talk to your health care provider about which screenings and vaccines you need and how often you need them. This information is not intended to replace advice given to you by your health care provider. Make sure you discuss any questions you have with your health care provider. Document Released: 06/14/2015 Document Revised: 02/05/2016 Document Reviewed: 03/19/2015 Elsevier Interactive Patient Education  2017 [place] Prevention in the Home Falls can cause injuries. They can happen to people of all ages. There are many things you can do to make your home safe and to help prevent falls. What can I do on the outside of my home? Regularly fix the edges of walkways and driveways and fix any cracks. Remove anything that might make you trip as you walk through a door, such as a  raised step or threshold. Trim any bushes or trees on the path to your home. Use bright outdoor lighting. Clear any walking paths of anything that might make someone trip, such as rocks or tools. Regularly check to see if handrails are loose or broken. Make sure that both sides of any steps have handrails. Any raised decks and porches should have guardrails on the edges. Have any leaves, snow, or ice cleared regularly. Use sand or salt on walking paths during winter. Clean up any spills in your garage right away. This includes oil or grease spills. What can I do in the bathroom? Use night lights. Install grab bars by the toilet and in the tub and shower. Do not use towel bars as grab bars. Use non-skid mats or decals in the tub or shower. If you need to sit down in the shower, use a plastic, non-slip stool. Keep the floor dry. Clean up any water that spills on the floor as soon as it happens. Remove soap buildup in the tub or shower regularly. Attach bath mats securely with double-sided non-slip rug tape. Do not have throw  rugs and other things on the floor that can make you trip. What can I do in the bedroom? Use night lights. Make sure that you have a light by your bed that is easy to reach. Do not use any sheets or blankets that are too big for your bed. They should not hang down onto the floor. Have a firm chair that has side arms. You can use this for support while you get dressed. Do not have throw rugs and other things on the floor that can make you trip. What can I do in the kitchen? Clean up any spills right away. Avoid walking on wet floors. Keep items that you use a lot in easy-to-reach places. If you need to reach something above you, use a strong step stool that has a grab bar. Keep electrical cords out of the way. Do not use floor polish or wax that makes floors slippery. If you must use wax, use non-skid floor wax. Do not have throw rugs and other things on the floor  that can make you trip. What can I do with my stairs? Do not leave any items on the stairs. Make sure that there are handrails on both sides of the stairs and use them. Fix handrails that are broken or loose. Make sure that handrails are as long as the stairways. Check any carpeting to make sure that it is firmly attached to the stairs. Fix any carpet that is loose or worn. Avoid having throw rugs at the top or bottom of the stairs. If you do have throw rugs, attach them to the floor with carpet tape. Make sure that you have a light switch at the top of the stairs and the bottom of the stairs. If you do not have them, ask someone to add them for you. What else can I do to help prevent falls? Wear shoes that: Do not have high heels. Have rubber bottoms. Are comfortable and fit you well. Are closed at the toe. Do not wear sandals. If you use a stepladder: Make sure that it is fully opened. Do not climb a closed stepladder. Make sure that both sides of the stepladder are locked into place. Ask someone to hold it for you, if possible. Clearly mark and make sure that you can see: Any grab bars or handrails. First and last steps. Where the edge of each step is. Use tools that help you move around (mobility aids) if they are needed. These include: Canes. Walkers. Scooters. Crutches. Turn on the lights when you go into a dark area. Replace any light bulbs as soon as they burn out. Set up your furniture so you have a clear path. Avoid moving your furniture around. If any of your floors are uneven, fix them. If there are any pets around you, be aware of where they are. Review your medicines with your doctor. Some medicines can make you feel dizzy. This can increase your chance of falling. Ask your doctor what other things that you can do to help prevent falls. This information is not intended to replace advice given to you by your health care provider. Make sure you discuss any questions you  have with your health care provider. Document Released: 03/14/2009 Document Revised: 10/24/2015 Document Reviewed: 06/22/2014 Elsevier Interactive Patient Education  2017 [employer].

## 2022-03-18 NOTE — Progress Notes (Addendum)
Subjective:   Palin Tristan is a 56 y.o. female who presents for Medicare Annual (Subsequent) preventive examination.   I connected with  Neta Ehlers on 03/18/22 by a audio enabled telemedicine application and verified that I am speaking with the correct person using two identifiers.  Patient Location: Home  Provider Location: Home Office  I discussed the limitations of evaluation and management by telemedicine. The patient expressed understanding and agreed to proceed.  Review of Systems     Cardiac Risk Factors include: advanced age (>21mn, >>64women);diabetes mellitus;hypertension     Objective:    Today's Vitals   03/18/22 1249  Weight: 160 lb (72.6 kg)  Height: _0  (1.575 m)   Body mass index is 29.26 kg/m.     03/18/2022   12:53 PM 10/09/2021    5:25 AM 04/18/2021   12:08 AM 02/12/2021   10:02 AM 04/07/2018   11:05 AM 03/15/2018    9:08 AM 11/04/2017    8:11 AM  Advanced Directives  Does Patient Have a Medical Advance Directive? _1  No No  Would patient like information on creating a medical advance directive? No - Patient declined No - Patient declined  No - Patient declined No - Patient declined No - Patient declined Yes (MAU/Ambulatory/Procedural Areas - Information given)    Current Medications (verified) Outpatient Encounter Medications as of 03/18/2022  Medication Sig   Accu-Chek FastClix Lancets MISC USE AS DIRECTED to check blood sugar once daily.   ACCU-CHEK GUIDE test strip USE TO CHECK BLOOD SUGAR ONCE DAILY   acetaminophen-codeine (TYLENOL #3) 300-30 MG tablet TAKE 1 TABLET BY MOUTH EVERY 12 HOURS AS NEEDED FOR MODERATE PAIN   aspirin EC 81 MG tablet Take 1 tablet (81 mg total) by mouth daily.   azelastine (ASTELIN) 0.1 % nasal spray Place 2 sprays into both nostrils 2 (two) times daily.   Blood Glucose Monitoring Suppl (ACCU-CHEK GUIDE ME) w/Device KIT 1 each by Does not apply route daily.   Elastic Bandages & Supports (TRUFORM ARM  S91M 15-20MMHG) MISC 1 each by Does not apply route daily.   ezetimibe (ZETIA) 10 MG tablet Take 1 tablet (10 mg total) by mouth daily.   fluticasone (FLONASE) 50 MCG/ACT nasal spray Place 2 sprays into both nostrils daily.   hydrocortisone (ANUSOL-HC) 25 MG suppository Place 1 suppository (25 mg total) rectally 2 (two) times daily.   ibuprofen (ADVIL) 800 MG tablet TAKE 1 TABLET BY MOUTH EVERY 12 HOURS AS NEEDED   lisinopril (ZESTRIL) 5 MG tablet Take 1 tablet (5 mg total) by mouth daily.   metFORMIN (GLUCOPHAGE) 500 MG tablet Take 1 tablet (500 mg total) by mouth 2 (two) times daily with a meal.   methocarbamol (ROBAXIN) 500 MG tablet Take 1 tablet (500 mg total) by mouth 2 (two) times daily as needed for muscle spasms.   Misc. Devices (CANE) MISC 1 each by Does not apply route as needed.   pantoprazole (PROTONIX) 40 MG tablet Take 1 tablet (40 mg total) by mouth daily.   psyllium (METAMUCIL) 58.6 % powder Take 1 packet by mouth 2 (two) times daily.   No facility-administered encounter medications on file as of 03/18/2022.    Allergies (verified) Atorvastatin and Glipizide   History: Past Medical History:  Diagnosis Date   Borderline diabetic    Chronic back pain    Diabetes mellitus without complication ([place]    Hypercholesteremia    Past Surgical History:  Procedure Laterality Date  ABDOMINAL HYSTERECTOMY     BACK SURGERY     CYSTOSCOPY WITH RETROGRADE PYELOGRAM, URETEROSCOPY AND STENT PLACEMENT Left 10/28/2015   Procedure: CYSTOSCOPY WITH RETROGRADE PYELOGRAM, URETEROSCOPY AND STENT PLACEMENT, AND LASER;  Surgeon: Irine Seal, MD;  Location: [facility];  Service: Urology;  Laterality: Left;   Family History  Problem Relation Age of Onset   Diabetes Mother    Hypertension Mother    Heart disease Mother        based at age 41 of MI   Diabetes Father    Hypertension Father    Heart disease Father    Cancer Maternal Aunt        liver   Colon cancer Neg Hx    Esophageal  cancer Neg Hx    Stomach cancer Neg Hx    Social History   Socioeconomic History   Marital status: Single    Spouse name: Not on file   Number of children: 3   Years of education: Not on file   Highest education level: Not on file  Occupational History   Occupation: Disabled  Tobacco Use   Smoking status: Never   Smokeless tobacco: Never  Vaping Use   Vaping Use: Never used  Substance and Sexual Activity   Alcohol use: No   Drug use: No   Sexual activity: Not Currently  Other Topics Concern   Not on file  Social History Narrative   Not on file   Social Determinants of Health   Financial Resource Strain: Low Risk  (03/18/2022)   Overall Financial Resource Strain (CARDIA)    Difficulty of Paying Living Expenses: Not hard at all  Food Insecurity: No Food Insecurity (03/18/2022)   Hunger Vital Sign    Worried About Running Out of Food in the Last Year: Never true    Ran Out of Food in the Last Year: Never true  Transportation Needs: No Transportation Needs (03/18/2022)   PRAPARE - Hydrologist (Medical): No    Lack of Transportation (Non-Medical): No  Physical Activity: Insufficiently Active (03/18/2022)   Exercise Vital Sign    Days of Exercise per Week: 3 days    Minutes of Exercise per Session: 30 min  Stress: No Stress Concern Present (03/18/2022)   [place]    Feeling of Stress : Not at all  Social Connections: Moderately Isolated (03/18/2022)   Social Connection and Isolation Panel [NHANES]    Frequency of Communication with Friends and Family: More than three times a week    Frequency of Social Gatherings with Friends and Family: More than three times a week    Attends Religious Services: More than 4 times per year    Active Member of [employer] or Organizations: No    Attends Music therapist: Never    Marital Status: Divorced    Tobacco  Counseling Counseling given: Not Answered   Clinical Intake:  Pre-visit preparation completed: Yes  Pain : No/denies pain     Nutritional Risks: None Diabetes: No  How often do you need to have someone help you when you read instructions, pamphlets, or other written materials from your doctor or pharmacy?: 1 - Never  Diabetic?yes  Nutrition Risk Assessment:  Has the patient had any N/V/D within the last 2 months?  No  Does the patient have any non-healing wounds?  No  Has the patient had any unintentional weight loss or weight gain?  No  Diabetes:  Is the patient diabetic?  Yes  If diabetic, was a CBG obtained today?  No  Did the patient bring in their glucometer from home?  No  How often do you monitor your CBG's? 2 x day .   Financial Strains and Diabetes Management:  Are you having any financial strains with the device, your supplies or your medication? No .  Does the patient want to be seen by Chronic Care Management for management of their diabetes?  No  Would the patient like to be referred to a Nutritionist or for Diabetic Management?  No   Diabetic Exams:  Diabetic Eye Exam: Completed 01/2022 Diabetic Foot Exam: Overdue, Pt has been advised about the importance in completing this exam. Pt is scheduled for diabetic foot exam on next office visit .   Interpreter Needed?: No  Information entered by :: Jadene Pierini, LPN   Activities of Daily Living    03/18/2022   12:52 PM  In your present state of health, do you have any difficulty performing the following activities:  Hearing? 0  Vision? 0  Difficulty concentrating or making decisions? 0  Walking or climbing stairs? 0  Dressing or bathing? 0  Doing errands, shopping? 0  Preparing Food and eating ? N  Using the Toilet? N  In the past six months, have you accidently leaked urine? N  Do you have problems with loss of bowel control? N  Managing your Medications? N  Managing your Finances? N   Housekeeping or managing your Housekeeping? N    Patient Care Team: Haydee Salter, MD as PCP - General (Family Medicine) O'Neal, Cassie Freer, MD as Consulting Physician (Cardiology) Debara Pickett Nadean Corwin, MD as Consulting Physician (Cardiology) Thornton Park, MD as Consulting Physician (Gastroenterology)  Indicate any recent Medical Services you may have received from other than [facility] providers in the past year (date may be approximate).     Assessment:   This is a routine wellness examination for Margan.  Hearing/Vision screen Vision Screening - Comments:: Annual eye exam wears glasses   Dietary issues and exercise activities discussed: Current Exercise Habits: Home exercise routine, Type of exercise: walking, Time (Minutes): 30, Frequency (Times/Week): 3, Weekly Exercise (Minutes/Week): 90, Intensity: Mild, Exercise limited by: None identified   Goals Addressed             This Visit's Progress    DIET - INCREASE WATER INTAKE         Depression Screen    03/18/2022   12:51 PM 08/06/2021    1:54 PM 02/12/2021   10:03 AM 09/25/2020    2:59 PM 12/19/2019    9:49 AM 06/21/2019    2:27 PM 06/13/2018    1:48 PM  PHQ 2/9 Scores  PHQ - 2 Score _0 PHQ- 9 Score  _1 Fall Risk    03/18/2022   12:50 PM 08/06/2021    1:16 PM 02/12/2021   10:03 AM 09/25/2020    2:51 PM 04/16/2020    9:15 AM  Fall Risk   Falls in the past year? 0 0 0 1 0  Number falls in past yr: 0 0 0 0 0  Injury with Fall? 0 0 0 0   Risk for fall due to : No Fall Risks No Fall Risks No Fall Risks    Follow up Falls prevention discussed Falls evaluation completed       FALL  RISK PREVENTION PERTAINING TO THE HOME:  Any stairs in or around the home? No  If so, are there any without handrails? No  Home free of loose throw rugs in walkways, pet beds, electrical cords, etc? Yes  Adequate lighting in your home to reduce risk of falls? Yes   ASSISTIVE DEVICES UTILIZED TO PREVENT  FALLS:  Life alert? No  Use of a cane, walker or w/c? No  Grab bars in the bathroom? No  Shower chair or bench in shower? No  Elevated toilet seat or a handicapped toilet? No        03/18/2022   12:53 PM  6CIT Screen  What Year? 0 points  What month? 0 points  What time? 0 points  Count back from 20 0 points  Months in reverse 0 points  Repeat phrase 0 points  Total Score 0 points    Immunizations Immunization History  Administered Date(s) Administered   PFIZER(Purple Top)SARS-COV-2 Vaccination 09/07/2019, 10/02/2019   Pneumococcal Polysaccharide-23 07/12/2015    TDAP status: Due, Education has been provided regarding the importance of this vaccine. Advised may receive this vaccine at local pharmacy or Health Dept. Aware to provide a copy of the vaccination record if obtained from local pharmacy or Health Dept. Verbalized acceptance and understanding.  Flu Vaccine status: Due, Education has been provided regarding the importance of this vaccine. Advised may receive this vaccine at local pharmacy or Health Dept. Aware to provide a copy of the vaccination record if obtained from local pharmacy or Health Dept. Verbalized acceptance and understanding.  Pneumococcal vaccine status: Up to date  Covid-19 vaccine status: Completed vaccines  Qualifies for Shingles Vaccine? Yes   Zostavax completed No   Shingrix Completed?: No.    Education has been provided regarding the importance of this vaccine. Patient has been advised to call insurance company to determine out of pocket expense if they have not yet received this vaccine. Advised may also receive vaccine at local pharmacy or Health Dept. Verbalized acceptance and understanding.  Screening Tests Health Maintenance  Topic Date Due   Zoster Vaccines- Shingrix (1 of 2) Never done   COVID-19 Vaccine (3 - Pfizer series) 11/27/2019   INFLUENZA VACCINE  Never done   FOOT EXAM  02/12/2022   MAMMOGRAM  03/20/2022   OPHTHALMOLOGY  EXAM  04/01/2022   HEMOGLOBIN A1C  08/06/2022   Diabetic kidney evaluation - Urine ACR  08/07/2022   Diabetic kidney evaluation - GFR measurement  02/06/2023   COLONOSCOPY (Pts 45-32yr Insurance coverage will need to be confirmed)  06/02/2023   TETANUS/TDAP  09/30/2026   Hepatitis C Screening  Completed   HIV Screening  Completed   HPV VACCINES  Aged Out    Health Maintenance  Health Maintenance Due  Topic Date Due   Zoster Vaccines- Shingrix (1 of 2) Never done   COVID-19 Vaccine (3 - Pfizer series) 11/27/2019   INFLUENZA VACCINE  Never done   FOOT EXAM  02/12/2022    Colorectal cancer screening: Type of screening: Colonoscopy. Completed 06/01/2013. Repeat every 10 years  Mammogram status: Completed 03/20/2021. Repeat every year  Bone Density status: Ordered Not of age . Pt provided with contact info and advised to call to schedule appt.  Lung Cancer Screening: (Low Dose CT Chest recommended if Age 57-80years, 30 pack-year currently smoking OR have quit w/in 15years.) does not qualify.   Lung Cancer Screening Referral: n/a  Additional Screening:  Hepatitis C Screening: does not qualify; Completed 07/12/2015  Vision  Screening: Recommended annual ophthalmology exams for early detection of glaucoma and other disorders of the eye. Is the patient up to date with their annual eye exam?  Yes  Who is the provider or what is the name of the office in which the patient attends annual eye exams? [place]  If pt is not established with a provider, would they like to be referred to a provider to establish care? No .   Dental Screening: Recommended annual dental exams for proper oral hygiene  Community Resource Referral / Chronic Care Management: CRR required this visit?  No   CCM required this visit?  No      Plan:     I have personally reviewed and noted the following in the patient's chart:   Medical and social history Use of alcohol, tobacco or illicit drugs   Current medications and supplements including opioid prescriptions. Patient is not currently taking opioid prescriptions. Functional ability and status Nutritional status Physical activity Advanced directives List of other physicians Hospitalizations, surgeries, and ER visits in previous 12 months Vitals Screenings to include cognitive, depression, and falls Referrals and appointments  In addition, I have reviewed and discussed with patient certain preventive protocols, quality metrics, and best practice recommendations. A written personalized care plan for preventive services as well as general preventive health recommendations were provided to patient.     Daphane Shepherd, LPN   76/70/1100   Nurse Notes: Patient Mammogram for 2023

## 2022-04-02 ENCOUNTER — Other Ambulatory Visit: Payer: Self-pay | Admitting: Family Medicine

## 2022-04-02 DIAGNOSIS — E1169 Type 2 diabetes mellitus with other specified complication: Secondary | ICD-10-CM

## 2022-04-27 ENCOUNTER — Other Ambulatory Visit: Payer: Self-pay | Admitting: Family Medicine

## 2022-04-27 DIAGNOSIS — G8929 Other chronic pain: Secondary | ICD-10-CM

## 2022-04-27 DIAGNOSIS — M5010 Cervical disc disorder with radiculopathy, unspecified cervical region: Secondary | ICD-10-CM

## 2022-04-29 ENCOUNTER — Encounter: Payer: Self-pay | Admitting: Pharmacist

## 2022-04-29 NOTE — Progress Notes (Signed)
[facility])                                            [facility] Quality Pharmacy Team                                        Statin Quality Measure Assessment    04/29/2022  Jill Anderson 1965-07-22 [id number]  I am a [facility] clinical pharmacist that reviews patients for statin quality initiatives.     Per review of chart and payor information, [name] has a diagnosis of diabetes but is not currently filling a statin prescription.  This places her into the SUPD (Statin Use In Patients with Diabetes) measure for CMS.    Patient has documented trials of statins with reported muscle pain/myalgias, but no corresponding CPT codes that would exclude patient from SUPD measure. If clinically appropriate, please consider evaluating and coding her past statin intolerance at tomorrow's office visit.  Z78.9 will not remove her from the measure.   Code for past statin intolerance or  other exclusions (required annually)  Provider Requirements: Associate code during an office visit or telehealth encounter  Drug Induced Myopathy G72.0   Myopathy, unspecified G72.9   Myositis, unspecified M60.9   Rhabdomyolysis M62.82   Alcoholic fatty liver K70.0   Cirrhosis of liver K74.69   Prediabetes R73.03    Thank you for your time and consideration, Jill Anderson, PharmD [facility]  [facility] Clinical Pharmacist Office: [phone]

## 2022-04-30 ENCOUNTER — Encounter: Payer: Self-pay | Admitting: Family Medicine

## 2022-04-30 ENCOUNTER — Ambulatory Visit: Payer: Medicare HMO | Admitting: Internal Medicine

## 2022-04-30 ENCOUNTER — Ambulatory Visit (INDEPENDENT_AMBULATORY_CARE_PROVIDER_SITE_OTHER): Payer: Medicare HMO | Admitting: Family Medicine

## 2022-04-30 VITALS — BP 122/70 | HR 68 | Temp 97.0°F | Ht 62.0 in | Wt 161.8 lb

## 2022-04-30 DIAGNOSIS — G8929 Other chronic pain: Secondary | ICD-10-CM | POA: Diagnosis not present

## 2022-04-30 DIAGNOSIS — M5416 Radiculopathy, lumbar region: Secondary | ICD-10-CM

## 2022-04-30 DIAGNOSIS — Z1231 Encounter for screening mammogram for malignant neoplasm of breast: Secondary | ICD-10-CM | POA: Diagnosis not present

## 2022-04-30 DIAGNOSIS — E1165 Type 2 diabetes mellitus with hyperglycemia: Secondary | ICD-10-CM

## 2022-04-30 DIAGNOSIS — I10 Essential (primary) hypertension: Secondary | ICD-10-CM

## 2022-04-30 LAB — HEMOGLOBIN A1C: Hgb A1c MFr Bld: 6.4 % (ref 4.6–6.5)

## 2022-04-30 LAB — GLUCOSE, RANDOM: Glucose, Bld: 118 mg/dL — ABNORMAL HIGH (ref 70–99)

## 2022-04-30 MED ORDER — IBUPROFEN 800 MG PO TABS: 800.0000 mg | ORAL_TABLET | Freq: Two times a day (BID) | ORAL | 2 refills | Status: DC | PRN

## 2022-04-30 MED ORDER — METFORMIN HCL 500 MG PO TABS: 500.0000 mg | ORAL_TABLET | Freq: Every day | ORAL | 3 refills | Status: DC

## 2022-04-30 NOTE — Progress Notes (Signed)
[facility] [address] [place] [address] Dept: [phone] Dept Fax: [phone]  Chronic Care Office Visit  Subjective:    Patient ID: Jill Anderson, female    DOB: 27-Dec-1965, 56 y.o..   MRN: [id number]  Chief Complaint  Patient presents with   Follow-up    F/u meds.  Fasting today.   Average BS at home 68, 98, 107, 145    History of Present Illness:  Patient is in today for reassessment of chronic medical issues.  Ms. Rossitto has a history of Type 2 diabetes. She had been managed on metformin 500 mg bid, but notes she has cut this down to once a day on her own, as her blood sugars were doing so well. She continues to work at fitness and weight loss. She feels she is following a good diet, esp. high in greens. She is also walking 5 miles every other day.   Ms. Ege  has a history of hypertension. She has stopped takign her lisinopril and is pleased that her blood pressure is doing better with her weight loss.   Ms. Perrault has a history of hyperlipidemia. She has been unable to tolerate statins due to myalgias. She is currently on ezetimibe under the direction of Dr. Debara Pickett.  Past Medical History: Patient Active Problem List   Diagnosis Date Noted   Hepatic steatosis 10/14/2021   Aortic atherosclerosis ([place]) 81/06/7508   Umbilical hernia 25/85/2778   Hemorrhoids 08/06/2021   Allergic rhinitis 08/06/2021   Statin intolerance 12/12/2018   Vitamin D deficiency 09/21/2017   Insomnia 05/14/2017   Menopausal symptoms 01/04/2017   Urticaria 12/14/2016   Pain in both hands 12/14/2016   Essential hypertension 10/27/2016   Bilateral anterior knee pain 03/20/2016   DDD (degenerative disc disease), lumbosacral 11/15/2015   Low back pain radiating to both legs 11/15/2015   Left ureteral stone 10/27/2015   New onset of headaches after age 1 10/07/2015   Hyperlipidemia 07/15/2015   Type 2 diabetes mellitus ([place])  03/04/2015   Chronic neck pain 09/11/2014   Cervical disc disorder with radiculopathy of cervical region 09/11/2014   Chronic radicular low back pain 09/11/2014   Past Surgical History:  Procedure Laterality Date   ABDOMINAL HYSTERECTOMY     BACK SURGERY     CYSTOSCOPY WITH RETROGRADE PYELOGRAM, URETEROSCOPY AND STENT PLACEMENT Left 10/28/2015   Procedure: CYSTOSCOPY WITH RETROGRADE PYELOGRAM, URETEROSCOPY AND STENT PLACEMENT, AND LASER;  Surgeon: Irine Seal, MD;  Location: [facility];  Service: Urology;  Laterality: Left;   Family History  Problem Relation Age of Onset   Diabetes Mother    Hypertension Mother    Heart disease Mother        based at age 57 of MI   Diabetes Father    Hypertension Father    Heart disease Father    Cancer Maternal Aunt        liver   Colon cancer Neg Hx    Esophageal cancer Neg Hx    Stomach cancer Neg Hx    Outpatient Medications Prior to Visit  Medication Sig Dispense Refill   Accu-Chek FastClix Lancets MISC USE AS DIRECTED to check blood sugar once daily. 100 each 11   acetaminophen-codeine (TYLENOL #3) 300-30 MG tablet TAKE 1 TABLET BY MOUTH EVERY 12 HOURS AS NEEDED FOR MODERATE PAIN 60 tablet 1   aspirin EC 81 MG tablet Take 1 tablet (81 mg total) by mouth daily. 30 tablet 11   azelastine (  ASTELIN) 0.1 % nasal spray Place 2 sprays into both nostrils 2 (two) times daily. 30 mL 0   Blood Glucose Monitoring Suppl (ACCU-CHEK GUIDE ME) w/Device KIT 1 each by Does not apply route daily. 1 kit 0   Elastic Bandages & Supports (TRUFORM ARM SLEEVE M 15-20MMHG) MISC 1 each by Does not apply route daily. 1 each 0   ezetimibe (ZETIA) 10 MG tablet Take 1 tablet (10 mg total) by mouth daily. 90 tablet 3   fluticasone (FLONASE) 50 MCG/ACT nasal spray Place 2 sprays into both nostrils daily. 1 g 0   glucose blood (ACCU-CHEK GUIDE) test strip USE 1  ONCE DAILY FOR BLOOD SUGAR 50 each 0   hydrocortisone (ANUSOL-HC) 25 MG suppository Place 1 suppository (25 mg total)  rectally 2 (two) times daily. 12 suppository 0   methocarbamol (ROBAXIN) 500 MG tablet Take 1 tablet (500 mg total) by mouth 2 (two) times daily as needed for muscle spasms. 20 tablet 0   Misc. Devices (CANE) MISC 1 each by Does not apply route as needed. 1 each 0   pantoprazole (PROTONIX) 40 MG tablet Take 1 tablet (40 mg total) by mouth daily. 30 tablet 0   psyllium (METAMUCIL) 58.6 % powder Take 1 packet by mouth 2 (two) times daily.     ibuprofen (ADVIL) 800 MG tablet TAKE 1 TABLET BY MOUTH EVERY 12 HOURS AS NEEDED 60 tablet 0   lisinopril (ZESTRIL) 5 MG tablet Take 1 tablet (5 mg total) by mouth daily. 90 tablet 3   metFORMIN (GLUCOPHAGE) 500 MG tablet Take 1 tablet (500 mg total) by mouth 2 (two) times daily with a meal. 180 tablet 3   No facility-administered medications prior to visit.   Allergies  Allergen Reactions   Atorvastatin Other (See Comments)    myalgia   Glipizide Other (See Comments)    Burning in stomach    Objective:   Today's Vitals   04/30/22 0845  BP: 122/70  Pulse: 68  Temp: (!) 97 F (36.1 C)  TempSrc: Temporal  SpO2: 98%  Weight: 161 lb 12.8 oz (73.4 kg)  Height: 5' 2" (1.575 m)   Body mass index is 29.59 kg/m.   General: Well developed, well nourished. No acute distress. Feet- Skin intact. No sign of maceration between toes. Nails are normal. Dorsalis pedis and posterior tibial artery pulses are normal.   5.07 monofilament testing normal. Psych: Alert and oriented. Normal mood and affect.  Health Maintenance Due  Topic Date Due   DTaP/Tdap/Td (1 - Tdap) Never done   Zoster Vaccines- Shingrix (1 of 2) Never done   MAMMOGRAM  03/20/2022   OPHTHALMOLOGY EXAM  04/01/2022     Assessment & Plan:   1. Type 2 diabetes mellitus with hyperglycemia, without long-term current use of insulin (HCC) We will recheck the A1c today. Ms. Kopischke's weight is down 11 lbs since he [place]. She certainly may be fine to reduce or stop some of her meds.  I will  continue her metformin at the lower dose.  - Glucose, random - Hemoglobin A1c - metFORMIN (GLUCOPHAGE) 500 MG tablet; Take 1 tablet (500 mg total) by mouth daily with breakfast.  Dispense: 180 tablet; Refill: 3  2. Essential hypertension Blood pressure is at goal off of meds.  3. Chronic radicular low back pain Continue PRN ibuprofen.  - ibuprofen (ADVIL) 800 MG tablet; Take 1 tablet (800 mg total) by mouth every 12 (twelve) hours as needed.  Dispense: 60 tablet; Refill: 2  4. Encounter for screening mammogram for malignant neoplasm of breast   - MM DIGITAL SCREENING BILATERAL; Future   Return in about 3 months (around 07/30/2022) for Reassessment.   Haydee Salter, MD

## 2022-05-20 ENCOUNTER — Other Ambulatory Visit: Payer: Self-pay | Admitting: Family Medicine

## 2022-05-20 DIAGNOSIS — E1169 Type 2 diabetes mellitus with other specified complication: Secondary | ICD-10-CM

## 2022-06-26 HISTORY — PX: ABDOMINOPLASTY/PANNICULECTOMY WITH LIPOSUCTION: SHX5577

## 2022-08-31 ENCOUNTER — Ambulatory Visit: Payer: Medicare HMO

## 2022-08-31 ENCOUNTER — Other Ambulatory Visit: Payer: Self-pay | Admitting: Family Medicine

## 2022-08-31 DIAGNOSIS — G8929 Other chronic pain: Secondary | ICD-10-CM

## 2022-08-31 DIAGNOSIS — M5010 Cervical disc disorder with radiculopathy, unspecified cervical region: Secondary | ICD-10-CM

## 2022-10-06 ENCOUNTER — Ambulatory Visit
Admission: RE | Admit: 2022-10-06 | Discharge: 2022-10-06 | Disposition: A | Discharge status: Home / Self Care | Payer: Medicare HMO | Source: Ambulatory Visit | Attending: Family Medicine | Admitting: Family Medicine

## 2022-10-06 DIAGNOSIS — Z1231 Encounter for screening mammogram for malignant neoplasm of breast: Secondary | ICD-10-CM

## 2022-10-12 DIAGNOSIS — H5203 Hypermetropia, bilateral: Secondary | ICD-10-CM | POA: Diagnosis not present

## 2022-10-12 DIAGNOSIS — H11041 Peripheral pterygium, stationary, right eye: Secondary | ICD-10-CM | POA: Diagnosis not present

## 2022-10-12 LAB — HM DIABETES EYE EXAM

## 2022-10-21 ENCOUNTER — Telehealth: Payer: Self-pay

## 2022-10-21 NOTE — Telephone Encounter (Signed)
Contacted pt to offer scheduling at our diabetic eye exam clinic on 5/30. Pt declined and stated she just got her eye exam, but wanted to schedule her f/u with Dr. Veto Kemps. Scheduled pt on 11/29/2022 at 9:20.

## 2022-11-02 ENCOUNTER — Ambulatory Visit (INDEPENDENT_AMBULATORY_CARE_PROVIDER_SITE_OTHER): Payer: Medicare HMO | Admitting: Family Medicine

## 2022-11-02 ENCOUNTER — Encounter: Payer: Self-pay | Admitting: Family Medicine

## 2022-11-02 VITALS — BP 130/76 | HR 59 | Temp 97.7°F | Ht 62.0 in | Wt 152.2 lb

## 2022-11-02 DIAGNOSIS — M722 Plantar fascial fibromatosis: Secondary | ICD-10-CM | POA: Diagnosis not present

## 2022-11-02 DIAGNOSIS — E782 Mixed hyperlipidemia: Secondary | ICD-10-CM

## 2022-11-02 DIAGNOSIS — I10 Essential (primary) hypertension: Secondary | ICD-10-CM | POA: Diagnosis not present

## 2022-11-02 DIAGNOSIS — E1165 Type 2 diabetes mellitus with hyperglycemia: Secondary | ICD-10-CM | POA: Diagnosis not present

## 2022-11-02 DIAGNOSIS — Z9889 Other specified postprocedural states: Secondary | ICD-10-CM | POA: Diagnosis not present

## 2022-11-02 LAB — LIPID PANEL
Cholesterol: 199 mg/dL (ref 0–200)
HDL: 57.5 mg/dL (ref >39.00)
LDL Cholesterol: 123 mg/dL — ABNORMAL HIGH (ref 0–99)
NonHDL: 141
Total CHOL/HDL Ratio: 3
Triglycerides: 88 mg/dL (ref 0.0–149.0)
VLDL: 17.6 mg/dL (ref 0.0–40.0)

## 2022-11-02 LAB — BASIC METABOLIC PANEL
BUN: 12 mg/dL (ref 6–23)
CO2: 27 mEq/L (ref 19–32)
Calcium: 9.7 mg/dL (ref 8.4–10.5)
Chloride: 101 mEq/L (ref 96–112)
Creatinine, Ser: 0.74 mg/dL (ref 0.40–1.20)
GFR: 89.27 mL/min (ref >60.00)
Glucose, Bld: 119 mg/dL — ABNORMAL HIGH (ref 70–99)
Potassium: 4.7 mEq/L (ref 3.5–5.1)
Sodium: 137 mEq/L (ref 135–145)

## 2022-11-02 LAB — URINALYSIS, ROUTINE W REFLEX MICROSCOPIC
Bilirubin Urine: NEGATIVE
Hgb urine dipstick: NEGATIVE
Ketones, ur: NEGATIVE
Leukocytes,Ua: NEGATIVE
Nitrite: NEGATIVE
RBC / HPF: NONE SEEN (ref 0–?)
Specific Gravity, Urine: 1.025 (ref 1.000–1.030)
Total Protein, Urine: NEGATIVE
Urine Glucose: NEGATIVE
Urobilinogen, UA: 0.2 (ref 0.0–1.0)
pH: 5.5 (ref 5.0–8.0)

## 2022-11-02 LAB — HEMOGLOBIN A1C: Hgb A1c MFr Bld: 6.2 % (ref 4.6–6.5)

## 2022-11-02 LAB — MICROALBUMIN / CREATININE URINE RATIO
Creatinine,U: 126.6 mg/dL
Microalb Creat Ratio: 0.6 mg/g (ref 0.0–30.0)
Microalb, Ur: <0.7 mg/dL (ref 0.0–1.9)

## 2022-11-02 MED ORDER — EZETIMIBE 10 MG PO TABS: 10.0000 mg | ORAL_TABLET | Freq: Every day | ORAL | 3 refills | Status: DC

## 2022-11-02 NOTE — Progress Notes (Signed)
[facility] [address] [place] [address] Dept: [phone] Dept Fax: [phone]  Chronic Care Office Visit  Subjective:    Patient ID: Jill Anderson, female    DOB: 1966/03/29, 57 y.o..   MRN: [id number]  Chief Complaint  Patient presents with   Medical Management of Chronic Issues    3 month f/u.  Fasting today.  C/o having pain in LT heel off/on and issues with sleeping at night.    History of Present Illness:  Patient is in today for reassessment of chronic medical issues.  Jill Anderson has a history of Type 2 diabetes. She had been managed on metformin 500 mg bid, but had been tapering down due to improved blood sugars. She stopped her metformin in Jan. She continues to work at fitness and weight loss. She is walking 5 miles every other day.   Jill Anderson  has a history of hypertension. She has been managing this with diet and exercise.   Jill Anderson has a history of hyperlipidemia. She has been unable to tolerate statins due to myalgias. She is currently on ezetimibe under the direction of Dr. Rennis Golden.  Jill Anderson notes she underwent a "tummy tuck" and liposuction in [place] in Jan. She feels she is healing very well and is pleased with her results.  Jill Anderson notes pain in the bottom of her left foot at the heal. This is worse first thin ing the morning and after sitting for periods. She has been doing stretches, which has helped.  Past Medical History: Patient Active Problem List   Diagnosis Date Noted   Hepatic steatosis 10/14/2021   Aortic atherosclerosis (HCC) 10/14/2021   Umbilical hernia 10/14/2021   Hemorrhoids 08/06/2021   Allergic rhinitis 08/06/2021   Statin intolerance 12/12/2018   Vitamin D deficiency 09/21/2017   Insomnia 05/14/2017   Menopausal symptoms 01/04/2017   Urticaria 12/14/2016   Pain in both hands 12/14/2016   Essential hypertension 10/27/2016   Bilateral anterior knee pain  03/20/2016   DDD (degenerative disc disease), lumbosacral 11/15/2015   Low back pain radiating to both legs 11/15/2015   Left ureteral stone 10/27/2015   New onset of headaches after age 58 10/07/2015   Hyperlipidemia 07/15/2015   Type 2 diabetes mellitus (HCC) 03/04/2015   Chronic neck pain 09/11/2014   Cervical disc disorder with radiculopathy of cervical region 09/11/2014   Chronic radicular low back pain 09/11/2014   Past Surgical History:  Procedure Laterality Date   ABDOMINAL HYSTERECTOMY     ABDOMINOPLASTY/PANNICULECTOMY WITH LIPOSUCTION  06/26/2022   Performed in [facility]   BACK SURGERY     CYSTOSCOPY WITH RETROGRADE PYELOGRAM, URETEROSCOPY AND STENT PLACEMENT Left 10/28/2015   Procedure: CYSTOSCOPY WITH RETROGRADE PYELOGRAM, URETEROSCOPY AND STENT PLACEMENT, AND LASER;  Surgeon: Bjorn Pippin, MD;  Location: [facility];  Service: Urology;  Laterality: Left;   Family History  Problem Relation Age of Onset   Diabetes Mother    Hypertension Mother    Heart disease Mother        based at age 43 of MI   Diabetes Father    Hypertension Father    Heart disease Father    Cancer Maternal Aunt        liver   Colon cancer Neg Hx    Esophageal cancer Neg Hx    Stomach cancer Neg Hx    Outpatient Medications Prior to Visit  Medication Sig Dispense Refill   Accu-Chek FastClix Lancets MISC USE  AS DIRECTED to check blood sugar once daily. 100 each 11   acetaminophen-codeine (TYLENOL #3) 300-30 MG tablet TAKE 1 TABLET BY MOUTH EVERY 8 HOURS AS NEEDED FOR MODERATE PAIN 60 tablet 0   aspirin EC 81 MG tablet Take 1 tablet (81 mg total) by mouth daily. 30 tablet 11   azelastine (ASTELIN) 0.1 % nasal spray Place 2 sprays into both nostrils 2 (two) times daily. 30 mL 0   Blood Glucose Monitoring Suppl (ACCU-CHEK GUIDE ME) w/Device KIT 1 each by Does not apply route daily. 1 kit 0   Elastic Bandages & Supports (TRUFORM ARM SLEEVE M 15-20MMHG) MISC 1 each by Does not apply route daily. 1  each 0   fluticasone (FLONASE) 50 MCG/ACT nasal spray Place 2 sprays into both nostrils daily. 1 g 0   glucose blood (ACCU-CHEK GUIDE) test strip USE 1  ONCE DAILY FOR BLOOD SUGAR 50 each 2   hydrocortisone (ANUSOL-HC) 25 MG suppository Place 1 suppository (25 mg total) rectally 2 (two) times daily. 12 suppository 0   ibuprofen (ADVIL) 800 MG tablet Take 1 tablet (800 mg total) by mouth every 12 (twelve) hours as needed. 60 tablet 2   methocarbamol (ROBAXIN) 500 MG tablet Take 1 tablet (500 mg total) by mouth 2 (two) times daily as needed for muscle spasms. 20 tablet 0   Misc. Devices (CANE) MISC 1 each by Does not apply route as needed. 1 each 0   pantoprazole (PROTONIX) 40 MG tablet Take 1 tablet (40 mg total) by mouth daily. 30 tablet 0   psyllium (METAMUCIL) 58.6 % powder Take 1 packet by mouth 2 (two) times daily.     ezetimibe (ZETIA) 10 MG tablet Take 1 tablet (10 mg total) by mouth daily. 90 tablet 3   metFORMIN (GLUCOPHAGE) 500 MG tablet Take 1 tablet (500 mg total) by mouth daily with breakfast. (Patient not taking: Reported on 11/02/2022) 180 tablet 3   No facility-administered medications prior to visit.   Allergies  Allergen Reactions   Atorvastatin Other (See Comments)    myalgia   Glipizide Other (See Comments)    Burning in stomach   Objective:   Today's Vitals   11/02/22 0914  BP: 130/76  Pulse: (!) 59  Temp: 97.7 F (36.5 C)  TempSrc: Temporal  SpO2: 99%  Weight: 152 lb 3.2 oz (69 kg)  Height: 5\' 2"  (1.575 m)   Body mass index is 27.84 kg/m.   General: Well developed, well nourished. No acute distress. Abdomen: Horizontal low abdominal incision is healing well. No sign of infection or dehiscence. Extremities: Pain indicated over the plantar aspect of the distal heel. Psych: Alert and oriented. Normal mood and affect.  Health Maintenance Due  Topic Date Due   DTaP/Tdap/Td (1 - Tdap) Never done   Zoster Vaccines- Shingrix (1 of 2) Never done   OPHTHALMOLOGY  EXAM  04/01/2022   Diabetic kidney evaluation - Urine ACR  08/07/2022   HEMOGLOBIN A1C  10/29/2022     Assessment & Plan:   Problem List Items Addressed This Visit       Cardiovascular and Mediastinum   Essential hypertension - Primary    Blood pressure is in good control. Continue to manage with diet and weight management.       Relevant Medications   ezetimibe (ZETIA) 10 MG tablet     Endocrine   Type 2 diabetes mellitus (HCC)    Diabetes has been in good control. She has now stopped her metformin.  We will reassess her A1c along with annual DM labs today.      Relevant Orders   Microalbumin / creatinine urine ratio   Basic metabolic panel   Hemoglobin A1c   Urinalysis, Routine w reflex microscopic     Musculoskeletal and Integument   Plantar fasciitis of left foot    Reviewed causes and expected course. I recommend she do icing and stretches with a frozen water bottle on  daily basis.        Other   Hyperlipidemia    I will reassess lipids today. Continue ezetimibe 10 mg daily.      Relevant Medications   ezetimibe (ZETIA) 10 MG tablet   Other Relevant Orders   Lipid panel   Other Visit Diagnoses     S/P abdominoplasty       Doing well. I recommend she massage the surgical scar with lotion daily.       Return in about 3 months (around 02/02/2023) for Reassessment.   Loyola Mast, MD

## 2022-11-02 NOTE — Assessment & Plan Note (Signed)
I will reassess lipids today. Continue ezetimibe 10 mg daily.

## 2022-11-02 NOTE — Assessment & Plan Note (Signed)
Diabetes has been in good control. She has now stopped her metformin. We will reassess her A1c along with annual DM labs today.

## 2022-11-02 NOTE — Assessment & Plan Note (Signed)
Blood pressure is in good control. Continue to manage with diet and weight management.

## 2022-11-02 NOTE — Assessment & Plan Note (Signed)
Reviewed causes and expected course. I recommend she do icing and stretches with a frozen water bottle on  daily basis.

## 2022-11-03 ENCOUNTER — Encounter: Payer: Self-pay | Admitting: Family Medicine

## 2022-12-17 ENCOUNTER — Other Ambulatory Visit: Payer: Self-pay | Admitting: Family Medicine

## 2022-12-17 DIAGNOSIS — M5010 Cervical disc disorder with radiculopathy, unspecified cervical region: Secondary | ICD-10-CM

## 2022-12-17 DIAGNOSIS — G8929 Other chronic pain: Secondary | ICD-10-CM

## 2022-12-18 NOTE — Telephone Encounter (Signed)
Refill request for  Tylenol #3 LR  08/31/22, #60, 0 rf LOV  11/02/22 FOV  none scheduled.    Please review and advise.  Thanks. Dm/cma

## 2023-02-09 ENCOUNTER — Telehealth: Payer: Self-pay | Admitting: Family Medicine

## 2023-02-09 NOTE — Telephone Encounter (Signed)
02/08/23 Jesusita Oka from [employer] called to follow up on the medical request for the pt. They faxed the request in 12/29/22 and they are yet to get the records sent to them. He says it is of high importance. Wants a call back on [phone].

## 2023-02-09 NOTE — Telephone Encounter (Signed)
Form printed and faxed to Medical records to process.  Jill Anderson, and advise of this.  Then  gave him the fax number and phone number to medical records dept so he can reach out to them. Dm/cma

## 2023-02-11 ENCOUNTER — Other Ambulatory Visit: Payer: Self-pay | Admitting: Nurse Practitioner

## 2023-02-11 DIAGNOSIS — M5010 Cervical disc disorder with radiculopathy, unspecified cervical region: Secondary | ICD-10-CM

## 2023-02-11 DIAGNOSIS — G8929 Other chronic pain: Secondary | ICD-10-CM

## 2023-03-01 ENCOUNTER — Ambulatory Visit (INDEPENDENT_AMBULATORY_CARE_PROVIDER_SITE_OTHER): Payer: Medicare HMO

## 2023-03-01 DIAGNOSIS — Z0000 Encounter for general adult medical examination without abnormal findings: Secondary | ICD-10-CM

## 2023-03-01 NOTE — Progress Notes (Signed)
Subjective:   Jill Anderson is a 57 y.o. female who presents for Medicare Annual (Subsequent) preventive examination.  Visit Complete: Virtual  I connected with  Creedmoor Cellar on 03/01/23 by a audio enabled telemedicine application and verified that I am speaking with the correct person using two identifiers.  Patient Location: Home  Provider Location: Office/Clinic  I discussed the limitations of evaluation and management by telemedicine. The patient expressed understanding and agreed to proceed.  Because this visit was a virtual/telehealth visit, some criteria may be missing or patient reported. Any vitals not documented were not able to be obtained and vitals that have been documented are patient reported.   Cardiac Risk Factors include: diabetes mellitus;dyslipidemia;hypertension     Objective:    Today's Vitals   03/01/23 1011  PainSc: 8    There is no height or weight on file to calculate BMI.     03/01/2023   10:19 AM 03/18/2022   12:53 PM 10/09/2021    5:25 AM 04/18/2021   12:08 AM 02/12/2021   10:02 AM 04/07/2018   11:05 AM 03/15/2018    9:08 AM  Advanced Directives  Does Patient Have a Medical Advance Directive? No No No No No No No  Would patient like information on creating a medical advance directive?  No - Patient declined No - Patient declined  No - Patient declined No - Patient declined No - Patient declined    Current Medications (verified) Outpatient Encounter Medications as of 03/01/2023  Medication Sig   Accu-Chek FastClix Lancets MISC USE AS DIRECTED to check blood sugar once daily.   acetaminophen-codeine (TYLENOL #3) 300-30 MG tablet TAKE 1 TABLET BY MOUTH EVERY 8 HOURS AS NEEDED FOR MODERATE PAIN   aspirin EC 81 MG tablet Take 1 tablet (81 mg total) by mouth daily.   azelastine (ASTELIN) 0.1 % nasal spray Place 2 sprays into both nostrils 2 (two) times daily.   Blood Glucose Monitoring Suppl (ACCU-CHEK GUIDE ME) w/Device KIT 1 each by Does not apply  route daily.   ezetimibe (ZETIA) 10 MG tablet Take 1 tablet (10 mg total) by mouth daily.   fluticasone (FLONASE) 50 MCG/ACT nasal spray Place 2 sprays into both nostrils daily.   glucose blood (ACCU-CHEK GUIDE) test strip USE 1  ONCE DAILY FOR BLOOD SUGAR   hydrocortisone (ANUSOL-HC) 25 MG suppository Place 1 suppository (25 mg total) rectally 2 (two) times daily.   ibuprofen (ADVIL) 800 MG tablet Take 1 tablet (800 mg total) by mouth every 12 (twelve) hours as needed.   methocarbamol (ROBAXIN) 500 MG tablet Take 1 tablet (500 mg total) by mouth 2 (two) times daily as needed for muscle spasms.   Misc. Devices (CANE) MISC 1 each by Does not apply route as needed.   pantoprazole (PROTONIX) 40 MG tablet Take 1 tablet (40 mg total) by mouth daily.   psyllium (METAMUCIL) 58.6 % powder Take 1 packet by mouth 2 (two) times daily.   Elastic Bandages & Supports (TRUFORM ARM SLEEVE M 15-20MMHG) MISC 1 each by Does not apply route daily. (Patient not taking: Reported on 03/01/2023)   No facility-administered encounter medications on file as of 03/01/2023.    Allergies (verified) Atorvastatin and Glipizide   History: Past Medical History:  Diagnosis Date   Borderline diabetic    Chronic back pain    Diabetes mellitus without complication (HCC)    Hypercholesteremia    Past Surgical History:  Procedure Laterality Date   ABDOMINAL HYSTERECTOMY  ABDOMINOPLASTY/PANNICULECTOMY WITH LIPOSUCTION  06/26/2022   Performed in [facility]   BACK SURGERY     CYSTOSCOPY WITH RETROGRADE PYELOGRAM, URETEROSCOPY AND STENT PLACEMENT Left 10/28/2015   Procedure: CYSTOSCOPY WITH RETROGRADE PYELOGRAM, URETEROSCOPY AND STENT PLACEMENT, AND LASER;  Surgeon: Bjorn Pippin, MD;  Location: [facility];  Service: Urology;  Laterality: Left;   Family History  Problem Relation Age of Onset   Diabetes Mother    Hypertension Mother    Heart disease Mother        based at age 81 of MI   Diabetes Father    Hypertension  Father    Heart disease Father    Cancer Maternal Aunt        liver   Colon cancer Neg Hx    Esophageal cancer Neg Hx    Stomach cancer Neg Hx    Social History   Socioeconomic History   Marital status: Single    Spouse name: Not on file   Number of children: 3   Years of education: Not on file   Highest education level: Not on file  Occupational History   Occupation: Disabled  Tobacco Use   Smoking status: Never   Smokeless tobacco: Never  Vaping Use   Vaping status: Never Used  Substance and Sexual Activity   Alcohol use: No   Drug use: Yes    Types: Codeine   Sexual activity: Not Currently  Other Topics Concern   Not on file  Social History Narrative   Not on file   Social Determinants of Health   Financial Resource Strain: Low Risk  (03/01/2023)   Overall Financial Resource Strain (CARDIA)    Difficulty of Paying Living Expenses: Not hard at all  Food Insecurity: No Food Insecurity (03/01/2023)   Hunger Vital Sign    Worried About Running Out of Food in the Last Year: Never true    Ran Out of Food in the Last Year: Never true  Transportation Needs: No Transportation Needs (03/01/2023)   PRAPARE - Administrator, Civil Service (Medical): No    Lack of Transportation (Non-Medical): No  Physical Activity: Insufficiently Active (03/01/2023)   Exercise Vital Sign    Days of Exercise per Week: 2 days    Minutes of Exercise per Session: 10 min  Stress: No Stress Concern Present (03/01/2023)   [employer] of Occupational Health - Occupational Stress Questionnaire    Feeling of Stress : Not at all  Social Connections: Moderately Isolated (03/01/2023)   Social Connection and Isolation Panel [NHANES]    Frequency of Communication with Friends and Family: More than three times a week    Frequency of Social Gatherings with Friends and Family: More than three times a week    Attends Religious Services: More than 4 times per year    Active Member of [employer] or  Organizations: No    Attends Engineer, structural: Never    Marital Status: Divorced    Tobacco Counseling Counseling given: Not Answered   Clinical Intake:  Pre-visit preparation completed: Yes  Pain : 0-10 Pain Score: 8  Pain Type: Chronic pain Pain Location: Neck Pain Descriptors / Indicators: Aching Pain Onset: More than a month ago Pain Frequency: Intermittent     Nutritional Risks: None Diabetes: Yes CBG done?: No Did pt. bring in CBG monitor from home?: No  How often do you need to have someone help you when you read instructions, pamphlets, or other written materials from your doctor  or pharmacy?: 1 - Never  Interpreter Needed?: No  Information entered by :: NAllen LPN   Activities of Daily Living    03/01/2023   10:13 AM 03/18/2022   12:52 PM  In your present state of health, do you have any difficulty performing the following activities:  Hearing? 0 0  Vision? 0 0  Difficulty concentrating or making decisions? 0 0  Walking or climbing stairs? 1 0  Comment go slow   Dressing or bathing? 0 0  Doing errands, shopping? 0 0  Preparing Food and eating ? N N  Using the Toilet? N N  In the past six months, have you accidently leaked urine? N N  Do you have problems with loss of bowel control? N N  Managing your Medications? N N  Managing your Finances? N N  Housekeeping or managing your Housekeeping? N N    Patient Care Team: Loyola Mast, MD as PCP - General (Family Medicine) O'Neal, Ronnald Ramp, MD as Consulting Physician (Cardiology) Rennis Golden Lisette Abu, MD as Consulting Physician (Cardiology) Tressia Danas, MD (Inactive) as Consulting Physician (Gastroenterology)  Indicate any recent Medical Services you may have received from other than [facility] providers in the past year (date may be approximate).     Assessment:   This is a routine wellness examination for Jill Anderson.  Hearing/Vision screen Hearing Screening - Comments:: Denies  hearing issues Vision Screening - Comments:: Regular eye exams, [facility]   Goals Addressed             This Visit's Progress    Patient Stated       03/01/2023, wants to beat diabetes and keep cholesterol under control       Depression Screen    03/01/2023   10:20 AM 11/02/2022    9:19 AM 03/18/2022   12:51 PM 08/06/2021    1:54 PM 02/12/2021   10:03 AM 09/25/2020    2:59 PM 12/19/2019    9:49 AM  PHQ 2/9 Scores  PHQ - 2 Score 0 0 1 2 1 1 1   PHQ- 9 Score 0   5  3 3     Fall Risk    03/01/2023   10:19 AM 11/02/2022    9:18 AM 03/18/2022   12:50 PM 08/06/2021    1:16 PM 02/12/2021   10:03 AM  Fall Risk   Falls in the past year? 0 0 0 0 0  Number falls in past yr: 0 0 0 0 0  Injury with Fall? 0 0 0 0 0  Risk for fall due to : Medication side effect;Impaired balance/gait;Impaired mobility No Fall Risks No Fall Risks No Fall Risks No Fall Risks  Follow up Falls prevention discussed;Falls evaluation completed Falls evaluation completed Falls prevention discussed Falls evaluation completed     MEDICARE RISK AT HOME: Medicare Risk at Home Any stairs in or around the home?: No If so, are there any without handrails?: No Home free of loose throw rugs in walkways, pet beds, electrical cords, etc?: Yes Adequate lighting in your home to reduce risk of falls?: Yes Life alert?: No Use of a cane, walker or w/c?: Yes Grab bars in the bathroom?: No Shower chair or bench in shower?: No Elevated toilet seat or a handicapped toilet?: Yes  TIMED UP AND GO:  Was the test performed?  No    Cognitive Function:        03/01/2023   10:21 AM 03/18/2022   12:53 PM  6CIT Screen  What Year?  0 points 0 points  What month? 0 points 0 points  What time? 0 points 0 points  Count back from 20 0 points 0 points  Months in reverse 0 points 0 points  Repeat phrase 0 points 0 points  Total Score 0 points 0 points    Immunizations Immunization History  Administered Date(s) Administered    PFIZER(Purple Top)SARS-COV-2 Vaccination 09/07/2019, 10/02/2019   Pneumococcal Polysaccharide-23 07/12/2015   Tdap 11/02/2022   Zoster Recombinant(Shingrix) 11/02/2022, 01/26/2023    TDAP status: Up to date  Flu Vaccine status: Declined, Education has been provided regarding the importance of this vaccine but patient still declined. Advised may receive this vaccine at local pharmacy or Health Dept. Aware to provide a copy of the vaccination record if obtained from local pharmacy or Health Dept. Verbalized acceptance and understanding.  Pneumococcal vaccine status: Up to date  Covid-19 vaccine status: Information provided on how to obtain vaccines.   Qualifies for Shingles Vaccine? Yes   Zostavax completed Yes   Shingrix Completed?: Yes  Screening Tests Health Maintenance  Topic Date Due   COVID-19 Vaccine (3 - 2023-24 season) 01/31/2023   Colonoscopy  06/02/2023   INFLUENZA VACCINE  08/30/2023 (Originally 12/31/2022)   FOOT EXAM  05/01/2023   HEMOGLOBIN A1C  05/04/2023   MAMMOGRAM  10/06/2023   OPHTHALMOLOGY EXAM  10/12/2023   Diabetic kidney evaluation - eGFR measurement  11/02/2023   Diabetic kidney evaluation - Urine ACR  11/02/2023   Medicare Annual Wellness (AWV)  02/29/2024   DTaP/Tdap/Td (2 - Td or Tdap) 11/01/2032   Hepatitis C Screening  Completed   HIV Screening  Completed   Zoster Vaccines- Shingrix  Completed   HPV VACCINES  Aged Out    Health Maintenance  Health Maintenance Due  Topic Date Due   COVID-19 Vaccine (3 - 2023-24 season) 01/31/2023   Colonoscopy  06/02/2023    Colorectal cancer screening: Type of screening: Colonoscopy. Completed 06/01/2013. Repeat every 10 years  Mammogram status: Completed 10/06/2022. Repeat every year  Bone Density status: n/a  Lung Cancer Screening: (Low Dose CT Chest recommended if Age 79-80 years, 20 pack-year currently smoking OR have quit w/in 15years.) does not qualify.   Lung Cancer Screening Referral:  no  Additional Screening:  Hepatitis C Screening: does qualify; Completed 07/12/2015  Vision Screening: Recommended annual ophthalmology exams for early detection of glaucoma and other disorders of the eye. Is the patient up to date with their annual eye exam?  Yes  Who is the provider or what is the name of the office in which the patient attends annual eye exams? [facility] If pt is not established with a provider, would they like to be referred to a provider to establish care? No .   Dental Screening: Recommended annual dental exams for proper oral hygiene  Diabetic Foot Exam: Diabetic Foot Exam: Completed 04/30/2022  Community Resource Referral / Chronic Care Management: CRR required this visit?  No   CCM required this visit?  No     Plan:     I have personally reviewed and noted the following in the patient's chart:   Medical and social history Use of alcohol, tobacco or illicit drugs  Current medications and supplements including opioid prescriptions. Patient is currently taking opioid prescriptions. Information provided to patient regarding non-opioid alternatives. Patient advised to discuss non-opioid treatment plan with their provider. Functional ability and status Nutritional status Physical activity Advanced directives List of other physicians Hospitalizations, surgeries, and ER visits in previous 12 months  Vitals Screenings to include cognitive, depression, and falls Referrals and appointments  In addition, I have reviewed and discussed with patient certain preventive protocols, quality metrics, and best practice recommendations. A written personalized care plan for preventive services as well as general preventive health recommendations were provided to patient.     Barb Merino, LPN   11/05/3014   After Visit Summary: (MyChart) Due to this being a telephonic visit, the after visit summary with patients personalized plan was offered to patient via MyChart    Nurse Notes: none

## 2023-03-01 NOTE — Patient Instructions (Addendum)
Jill Anderson , Thank you for taking time to come for your Medicare Wellness Visit. I appreciate your ongoing commitment to your health goals. Please review the following plan we discussed and let me know if I can assist you in the future.   Referrals/Orders/Follow-Ups/Clinician Recommendations: none  This is a list of the screening recommended for you and due dates:  Health Maintenance  Topic Date Due   COVID-19 Vaccine (3 - 2023-24 season) 01/31/2023   Colon Cancer Screening  06/02/2023   Flu Shot  08/30/2023*   Complete foot exam   05/01/2023   Hemoglobin A1C  05/04/2023   Mammogram  10/06/2023   Eye exam for diabetics  10/12/2023   Yearly kidney function blood test for diabetes  11/02/2023   Yearly kidney health urinalysis for diabetes  11/02/2023   Medicare Annual Wellness Visit  02/29/2024   DTaP/Tdap/Td vaccine (2 - Td or Tdap) 11/01/2032   Hepatitis C Screening  Completed   HIV Screening  Completed   Zoster (Shingles) Vaccine  Completed   HPV Vaccine  Aged Out  *Topic was postponed. The date shown is not the original due date.    Advanced directives: (ACP Link)Information on Advanced Care Planning can be found at [facility] Directives Advance Health Care Directives ([email or web address]   Next Medicare Annual Wellness Visit scheduled for next year: Yes Managing Pain Without Opioids Opioids are strong medicines used to treat moderate to severe pain. For some people, especially those who have long-term (chronic) pain, opioids may not be the best choice for pain management due to: Side effects like nausea, constipation, and sleepiness. The risk of addiction (opioid use disorder). The longer you take opioids, the greater your risk of addiction. Pain that lasts for more than 3 months is called chronic pain. Managing chronic pain usually requires more than one approach and is often provided by a team of health care providers working together  (multidisciplinary approach). Pain management may be done at a pain management center or pain clinic. How to manage pain without the use of opioids Use non-opioid medicines Non-opioid medicines for pain may include: Over-the-counter or prescription non-steroidal anti-inflammatory drugs (NSAIDs). These may be the first medicines used for pain. They work well for muscle and bone pain, and they reduce swelling. Acetaminophen. This over-the-counter medicine may work well for milder pain but not swelling. Antidepressants. These may be used to treat chronic pain. A certain type of antidepressant (tricyclics) is often used. These medicines are given in lower doses for pain than when used for depression. Anticonvulsants. These are usually used to treat seizures but may also reduce nerve (neuropathic) pain. Muscle relaxants. These relieve pain caused by sudden muscle tightening (spasms). You may also use a pain medicine that is applied to the skin as a patch, cream, or gel (topical analgesic), such as a numbing medicine. These may cause fewer side effects than medicines taken by mouth. Do certain therapies as directed Some therapies can help with pain management. They include: Physical therapy. You will do exercises to gain strength and flexibility. A physical therapist may teach you exercises to move and stretch parts of your body that are weak, stiff, or painful. You can learn these exercises at physical therapy visits and practice them at home. Physical therapy may also involve: Massage. Heat wraps or applying heat or cold to affected areas. Electrical signals that interrupt pain signals (transcutaneous electrical nerve stimulation, TENS). Weak lasers that reduce pain and swelling (low-level laser  therapy). Signals from your body that help you learn to regulate pain (biofeedback). Occupational therapy. This helps you to learn ways to function at home and work with less pain. Recreational therapy. This  involves trying new activities or hobbies, such as a physical activity or drawing. Mental health therapy, including: Cognitive behavioral therapy (CBT). This helps you learn coping skills for dealing with pain. Acceptance and commitment therapy (ACT) to change the way you think and react to pain. Relaxation therapies, including muscle relaxation exercises and mindfulness-based stress reduction. Pain management counseling. This may be individual, family, or group counseling.  Receive medical treatments Medical treatments for pain management include: Nerve block injections. These may include a pain blocker and anti-inflammatory medicines. You may have injections: Near the spine to relieve chronic back or neck pain. Into joints to relieve back or joint pain. Into nerve areas that supply a painful area to relieve body pain. Into muscles (trigger point injections) to relieve some painful muscle conditions. A medical device placed near your spine to help block pain signals and relieve nerve pain or chronic back pain (spinal cord stimulation device). Acupuncture. Follow these instructions at home Medicines Take over-the-counter and prescription medicines only as told by your health care provider. If you are taking pain medicine, ask your health care providers about possible side effects to watch out for. Do not drive or use heavy machinery while taking prescription opioid pain medicine. Lifestyle  Do not use drugs or alcohol to reduce pain. If you drink alcohol, limit how much you have to: 0-1 drink a day for women who are not pregnant. 0-2 drinks a day for men. Know how much alcohol is in a drink. In the [place] one drink equals one 12 oz bottle of beer (355 mL), one 5 oz glass of wine (148 mL), or one 1 oz glass of hard liquor (44 mL). Do not use any products that contain nicotine or tobacco. These products include cigarettes, chewing tobacco, and vaping devices, such as e-cigarettes. If you  need help quitting, ask your health care provider. Eat a healthy diet and maintain a healthy weight. Poor diet and excess weight may make pain worse. Eat foods that are high in fiber. These include fresh fruits and vegetables, whole grains, and beans. Limit foods that are high in fat and processed sugars, such as fried and sweet foods. Exercise regularly. Exercise lowers stress and may help relieve pain. Ask your health care provider what activities and exercises are safe for you. If your health care provider approves, join an exercise class that combines movement and stress reduction. Examples include yoga and tai chi. Get enough sleep. Lack of sleep may make pain worse. Lower stress as much as possible. Practice stress reduction techniques as told by your therapist. General instructions Work with all your pain management providers to find the treatments that work best for you. You are an important member of your pain management team. There are many things you can do to reduce pain on your own. Consider joining an online or in-person support group for people who have chronic pain. Keep all follow-up visits. This is important. Where to find more information You can find more information about managing pain without opioids from: American Academy of Pain Medicine: painmed.org Institute for Chronic Pain: instituteforchronicpain.org American Chronic Pain Association: theacpa.org Contact a health care provider if: You have side effects from pain medicine. Your pain gets worse or does not get better with treatments or home therapy. You are struggling with  anxiety or depression. Summary Many types of pain can be managed without opioids. Chronic pain may respond better to pain management without opioids. Pain is best managed when you and a team of health care providers work together. Pain management without opioids may include non-opioid medicines, medical treatments, physical therapy, mental health  therapy, and lifestyle changes. Tell your health care providers if your pain gets worse or is not being managed well enough. This information is not intended to replace advice given to you by your health care provider. Make sure you discuss any questions you have with your health care provider. Document Revised: 08/28/2020 Document Reviewed: 08/28/2020 Elsevier Patient Education  2024 Elsevier Inc.   insert Preventive Care Attachment Reference

## 2023-03-19 ENCOUNTER — Ambulatory Visit: Payer: Medicare HMO | Admitting: Family Medicine

## 2023-03-19 ENCOUNTER — Encounter: Payer: Self-pay | Admitting: Family Medicine

## 2023-03-19 VITALS — BP 136/74 | HR 67 | Temp 98.1°F | Ht 62.0 in | Wt 157.4 lb

## 2023-03-19 DIAGNOSIS — E782 Mixed hyperlipidemia: Secondary | ICD-10-CM | POA: Diagnosis not present

## 2023-03-19 DIAGNOSIS — I10 Essential (primary) hypertension: Secondary | ICD-10-CM

## 2023-03-19 DIAGNOSIS — E1165 Type 2 diabetes mellitus with hyperglycemia: Secondary | ICD-10-CM | POA: Diagnosis not present

## 2023-03-19 DIAGNOSIS — J309 Allergic rhinitis, unspecified: Secondary | ICD-10-CM

## 2023-03-19 DIAGNOSIS — Z789 Other specified health status: Secondary | ICD-10-CM

## 2023-03-19 DIAGNOSIS — M7712 Lateral epicondylitis, left elbow: Secondary | ICD-10-CM

## 2023-03-19 LAB — GLUCOSE, RANDOM: Glucose, Bld: 120 mg/dL — ABNORMAL HIGH (ref 70–99)

## 2023-03-19 LAB — HEMOGLOBIN A1C: Hgb A1c MFr Bld: 6.7 % — ABNORMAL HIGH (ref 4.6–6.5)

## 2023-03-19 MED ORDER — NAPROXEN 500 MG PO TABS: 500.0000 mg | ORAL_TABLET | Freq: Two times a day (BID) | ORAL | 0 refills | Status: DC

## 2023-03-19 MED ORDER — FLUTICASONE PROPIONATE 50 MCG/ACT NA SUSP
1.0000 | Freq: Two times a day (BID) | NASAL | 5 refills | Status: DC
Start: 2023-03-19 — End: 2023-05-05

## 2023-03-19 NOTE — Progress Notes (Signed)
[facility] [address] [place] [address] Dept: [phone] Dept Fax: [phone]  Chronic Care Office Visit  Subjective:    Patient ID: Jill Anderson, female    DOB: 1965/09/28, 57 y.o..   MRN: [id number]  Chief Complaint  Patient presents with   Follow-up    3 month f/u.  C/o having body aches.     History of Present Illness:  Patient is in today for reassessment of chronic medical issues.  Ms. Hamre has a history of Type 2 diabetes. She is currently managed with diet and exercise.    Ms. Creekmore  has a history of hypertension. She has been managing this with diet and exercise.   Ms. Groulx has a history of hyperlipidemia. She has been unable to tolerate statins due to myalgias. She is currently on ezetimibe 10 mg daily under the direction of Dr. Rennis Golden.   Ms. Paszkowski notes she has been having increased rhinorrhea and sinus pressure over the past 2 weeks. She does use fluticasone nasal spray and azelastine spray.   Ms. Thelusma notes pain in her left elbow area. She is unsure of the cause. she is not aware of any specific trigger for this.  Past Medical History: Patient Active Problem List   Diagnosis Date Noted   Plantar fasciitis of left foot 11/02/2022   Hepatic steatosis 10/14/2021   Aortic atherosclerosis (HCC) 10/14/2021   Umbilical hernia 10/14/2021   Hemorrhoids 08/06/2021   Allergic rhinitis 08/06/2021   Statin intolerance 12/12/2018   Vitamin D deficiency 09/21/2017   Insomnia 05/14/2017   Menopausal symptoms 01/04/2017   Urticaria 12/14/2016   Pain in both hands 12/14/2016   Essential hypertension 10/27/2016   Bilateral anterior knee pain 03/20/2016   DDD (degenerative disc disease), lumbosacral 11/15/2015   Low back pain radiating to both legs 11/15/2015   Left ureteral stone 10/27/2015   New onset of headaches after age 3 10/07/2015   Hyperlipidemia 07/15/2015   Type 2 diabetes mellitus (HCC)  03/04/2015   Chronic neck pain 09/11/2014   Cervical disc disorder with radiculopathy of cervical region 09/11/2014   Chronic radicular low back pain 09/11/2014   Past Surgical History:  Procedure Laterality Date   ABDOMINAL HYSTERECTOMY     ABDOMINOPLASTY/PANNICULECTOMY WITH LIPOSUCTION  06/26/2022   Performed in [facility]   BACK SURGERY     CYSTOSCOPY WITH RETROGRADE PYELOGRAM, URETEROSCOPY AND STENT PLACEMENT Left 10/28/2015   Procedure: CYSTOSCOPY WITH RETROGRADE PYELOGRAM, URETEROSCOPY AND STENT PLACEMENT, AND LASER;  Surgeon: Bjorn Pippin, MD;  Location: [facility];  Service: Urology;  Laterality: Left;   Family History  Problem Relation Age of Onset   Diabetes Mother    Hypertension Mother    Heart disease Mother        based at age 35 of MI   Diabetes Father    Hypertension Father    Heart disease Father    Cancer Maternal Aunt        liver   Colon cancer Neg Hx    Esophageal cancer Neg Hx    Stomach cancer Neg Hx    Outpatient Medications Prior to Visit  Medication Sig Dispense Refill   Accu-Chek FastClix Lancets MISC USE AS DIRECTED to check blood sugar once daily. 100 each 11   acetaminophen-codeine (TYLENOL #3) 300-30 MG tablet TAKE 1 TABLET BY MOUTH EVERY 8 HOURS AS NEEDED FOR MODERATE PAIN 60 tablet 0   aspirin EC 81 MG tablet Take 1 tablet (81 mg  total) by mouth daily. 30 tablet 11   azelastine (ASTELIN) 0.1 % nasal spray Place 2 sprays into both nostrils 2 (two) times daily. 30 mL 0   Blood Glucose Monitoring Suppl (ACCU-CHEK GUIDE ME) w/Device KIT 1 each by Does not apply route daily. 1 kit 0   Elastic Bandages & Supports (TRUFORM ARM SLEEVE M 15-20MMHG) MISC 1 each by Does not apply route daily. 1 each 0   ezetimibe (ZETIA) 10 MG tablet Take 1 tablet (10 mg total) by mouth daily. 90 tablet 3   glucose blood (ACCU-CHEK GUIDE) test strip USE 1  ONCE DAILY FOR BLOOD SUGAR 50 each 2   hydrocortisone (ANUSOL-HC) 25 MG suppository Place 1 suppository (25 mg total)  rectally 2 (two) times daily. 12 suppository 0   ibuprofen (ADVIL) 800 MG tablet Take 1 tablet (800 mg total) by mouth every 12 (twelve) hours as needed. 60 tablet 2   methocarbamol (ROBAXIN) 500 MG tablet Take 1 tablet (500 mg total) by mouth 2 (two) times daily as needed for muscle spasms. 20 tablet 0   Misc. Devices (CANE) MISC 1 each by Does not apply route as needed. 1 each 0   pantoprazole (PROTONIX) 40 MG tablet Take 1 tablet (40 mg total) by mouth daily. 30 tablet 0   psyllium (METAMUCIL) 58.6 % powder Take 1 packet by mouth 2 (two) times daily.     fluticasone (FLONASE) 50 MCG/ACT nasal spray Place 2 sprays into both nostrils daily. 1 g 0   No facility-administered medications prior to visit.   Allergies  Allergen Reactions   Atorvastatin Other (See Comments)    myalgia   Glipizide Other (See Comments)    Burning in stomach   Objective:   Today's Vitals   03/19/23 1027  BP: 136/74  Pulse: 67  Temp: 98.1 F (36.7 C)  TempSrc: Temporal  SpO2: 97%  Weight: 157 lb 6.4 oz (71.4 kg)  Height: 5\' 2"  (1.575 m)   Body mass index is 28.79 kg/m.   General: Well developed, well nourished. No acute distress. HEENT: Normocephalic, non-traumatic. PERRL, EOMI. Conjunctiva clear. Nose with moderate to severe congestion,   but scant rhinorrhea. Mild tenderness on percussion over sinuses. Mucous membranes moist. Minimal mucous   streaking of the posterior oropharynx. Good dentition. Extremities: Full ROM. No joint swelling. Mild to moderate tenderness over the lateral epicondyle.  Psych: Alert and oriented. Normal mood and affect.  Health Maintenance Due  Topic Date Due   Colonoscopy  06/02/2023     Assessment & Plan:   Problem List Items Addressed This Visit       Cardiovascular and Mediastinum   Essential hypertension - Primary    Blood pressure is in acceptable control. Continue to manage with diet and weight management. I cautioned her regarding mild weight regain.          Respiratory   Allergic rhinitis    Exam shows significant congestion. I recommend she increase her Flonase to 2 sprays a day. Discussed signs of sinus infection to watch for.      Relevant Medications   fluticasone (FLONASE) 50 MCG/ACT nasal spray     Endocrine   Type 2 diabetes mellitus (HCC)    Diabetes has been in good control. We will reassess her A1c today.      Relevant Orders   Glucose, random   Hemoglobin A1c     Other   Hyperlipidemia    Stable. Continue ezetimibe 10 mg daily.  Statin intolerance   Other Visit Diagnoses     Lateral epicondylitis of left elbow       Recommend use of a tennis elbow splint. I will prescribe a 7-day course of naproxen 500 mg bid.   Relevant Medications   naproxen (NAPROSYN) 500 MG tablet       Return in about 3 months (around 06/19/2023) for Reassessment.   Loyola Mast, MD

## 2023-03-19 NOTE — Assessment & Plan Note (Signed)
Stable. Continue ezetimibe 10 mg daily.

## 2023-03-19 NOTE — Assessment & Plan Note (Signed)
Blood pressure is in acceptable control. Continue to manage with diet and weight management. I cautioned her regarding mild weight regain.

## 2023-03-19 NOTE — Assessment & Plan Note (Signed)
Diabetes has been in good control. We will reassess her A1c today.

## 2023-03-19 NOTE — Patient Instructions (Signed)
Tennis Elbow  Tennis elbow (lateral epicondylitis) is inflammation of tendons in your outer forearm, near your elbow. Tendons are tissues that connect muscle to bone. When you have tennis elbow, inflammation affects the tendons that you use to bend your wrist and move your hand up. Inflammation occurs in the lower part of the upper arm bone (humerus), where the tendons connect to the bone (lateral epicondyle). Tennis elbow often affects people who play tennis, but anyone may get the condition from repeatedly extending the wrist or turning the forearm. What are the causes? This condition is usually caused by repeatedly extending the wrist, turning the forearm, and using the hands. It can result from sports or work that requires repetitive forearm movements. In some cases, it may be caused by a sudden injury. What increases the risk? You are more likely to develop tennis elbow if you play tennis or another racket sport. You also have a higher risk if you frequently use your hands for work. Besides people who play tennis, others at greater risk include: People who use computers. Construction workers. People who work in factories. Musicians. Cooks. Cashiers. What are the signs or symptoms? Symptoms of this condition include: Pain and tenderness in the forearm and the outer part of the elbow. Pain may be felt only when using the arm, or it may be there all the time. A burning feeling that starts in the elbow and spreads down the forearm. A weak grip in the hand. How is this diagnosed? This condition is diagnosed based on your symptoms, your medical history, and a physical exam. You may also have X-rays or an MRI to: Confirm the diagnosis. Look for other issues. Check for tears in the ligaments, muscles, or tendons. How is this treated? Resting and icing your arm is often the first treatment. Your health care provider may also recommend: Medicines to reduce pain and inflammation. These may be in  the form of a pill, topical gels, or shots of a steroid medicine (cortisone). An elbow strap to reduce stress on the area. Physical therapy. This may include massage or exercises or both. An elbow brace to restrict the movements that cause symptoms. If these treatments do not help relieve your symptoms, your health care provider may recommend surgery to remove damaged muscle and reattach healthy muscle to bone. Follow these instructions at home: If you have a brace or strap: Wear the brace or strap as told by your health care provider. Remove it only as told by your health care provider. Check the skin around the brace or strap every day. Tell your health care provider about any concerns. Loosen the brace if your fingers tingle, become numb, or turn cold and blue. Keep the brace clean. If the brace or strap is not waterproof: Do not let it get wet. Cover it with a watertight covering when you take a bath or a shower. Managing pain, stiffness, and swelling  If directed, put ice on the injured area. To do this: If you have a removable brace or strap, remove it as told by your health care provider. Put ice in a plastic bag. Place a towel between your skin and the bag. Leave the ice on for 20 minutes, 2-3 times a day. Remove the ice if your skin turns bright red. This is very important. If you cannot feel pain, heat, or cold, you have a greater risk of damage to the area. Move your fingers often to reduce stiffness and swelling. Activity Rest your elbow   and wrist and avoid activities that cause symptoms as told by your health care provider. Do physical therapy exercises as told by your health care provider. If you lift an object, lift it with your palm facing up. This reduces stress on your elbow. Lifestyle If your tennis elbow is caused by sports, check your equipment and make sure that: You use it correctly. It is good match for you. If your tennis elbow is caused by work or computer  use, take frequent breaks to stretch your arm. Talk with your employer about ways to manage your condition at work. General instructions Take over-the-counter and prescription medicines only as told by your health care provider. Do not use any products that contain nicotine or tobacco. These products include cigarettes, chewing tobacco, and vaping devices, such as e-cigarettes. If you need help quitting, ask your health care provider. Keep all follow-up visits. This is important. How is this prevented? Before and after activity: Warm up and stretch before being active. Cool down and stretch after being active. Give your body time to rest between periods of activity. During activity: Make sure to use equipment that fits you. If you play tennis, put power in your stroke with your lower body. Avoid using your arm only. Maintain physical fitness, including: Strength. Flexibility. Endurance. Do exercises to strengthen the forearm muscles. Contact a health care provider if: You have pain that gets worse or does not get better with treatment. You have numbness or weakness in your forearm, hand, or fingers. Get help right away if: Your pain is severe. You cannot move your wrist. Summary Tennis elbow (lateral epicondylitis) is inflammation of tendons in your outer forearm, near your elbow. Common symptoms include pain and tenderness in your forearm and the outer part of your elbow. This condition is usually caused by repeatedly extending your wrist, turning your forearm, and using your hands. The first treatment is often resting and icing your arm to relieve symptoms. Further treatment may include taking medicine, getting physical therapy, wearing a brace or strap, or having surgery. This information is not intended to replace advice given to you by your health care provider. Make sure you discuss any questions you have with your health care provider. Document Revised: 11/28/2019 Document  Reviewed: 11/28/2019 Elsevier Patient Education  2024 Elsevier Inc.  

## 2023-03-19 NOTE — Assessment & Plan Note (Signed)
Exam shows significant congestion. I recommend she increase her Flonase to 2 sprays a day. Discussed signs of sinus infection to watch for.

## 2023-04-24 ENCOUNTER — Observation Stay (HOSPITAL_BASED_OUTPATIENT_CLINIC_OR_DEPARTMENT_OTHER): Payer: Medicare HMO

## 2023-04-24 ENCOUNTER — Inpatient Hospital Stay (HOSPITAL_COMMUNITY)
Admission: EM | Admit: 2023-04-24 | Discharge: 2023-04-26 | DRG: 287 | Disposition: A | Discharge status: Home / Self Care | Payer: Medicare HMO | Attending: Internal Medicine | Admitting: Internal Medicine

## 2023-04-24 ENCOUNTER — Emergency Department (HOSPITAL_COMMUNITY): Payer: Medicare HMO

## 2023-04-24 ENCOUNTER — Other Ambulatory Visit: Payer: Self-pay

## 2023-04-24 ENCOUNTER — Encounter (HOSPITAL_COMMUNITY): Payer: Self-pay

## 2023-04-24 DIAGNOSIS — R002 Palpitations: Secondary | ICD-10-CM | POA: Diagnosis present

## 2023-04-24 DIAGNOSIS — E782 Mixed hyperlipidemia: Secondary | ICD-10-CM

## 2023-04-24 DIAGNOSIS — I6523 Occlusion and stenosis of bilateral carotid arteries: Secondary | ICD-10-CM

## 2023-04-24 DIAGNOSIS — M791 Myalgia, unspecified site: Secondary | ICD-10-CM | POA: Diagnosis present

## 2023-04-24 DIAGNOSIS — Z8249 Family history of ischemic heart disease and other diseases of the circulatory system: Secondary | ICD-10-CM | POA: Diagnosis not present

## 2023-04-24 DIAGNOSIS — R252 Cramp and spasm: Secondary | ICD-10-CM | POA: Diagnosis present

## 2023-04-24 DIAGNOSIS — E785 Hyperlipidemia, unspecified: Secondary | ICD-10-CM | POA: Diagnosis present

## 2023-04-24 DIAGNOSIS — G4700 Insomnia, unspecified: Secondary | ICD-10-CM | POA: Diagnosis not present

## 2023-04-24 DIAGNOSIS — Z6828 Body mass index (BMI) 28.0-28.9, adult: Secondary | ICD-10-CM

## 2023-04-24 DIAGNOSIS — Z7984 Long term (current) use of oral hypoglycemic drugs: Secondary | ICD-10-CM | POA: Diagnosis not present

## 2023-04-24 DIAGNOSIS — I10 Essential (primary) hypertension: Secondary | ICD-10-CM | POA: Diagnosis not present

## 2023-04-24 DIAGNOSIS — M549 Dorsalgia, unspecified: Secondary | ICD-10-CM | POA: Diagnosis present

## 2023-04-24 DIAGNOSIS — E7800 Pure hypercholesterolemia, unspecified: Secondary | ICD-10-CM | POA: Diagnosis not present

## 2023-04-24 DIAGNOSIS — M1990 Unspecified osteoarthritis, unspecified site: Secondary | ICD-10-CM | POA: Diagnosis present

## 2023-04-24 DIAGNOSIS — R0789 Other chest pain: Secondary | ICD-10-CM | POA: Diagnosis not present

## 2023-04-24 DIAGNOSIS — E663 Overweight: Secondary | ICD-10-CM | POA: Diagnosis not present

## 2023-04-24 DIAGNOSIS — E1165 Type 2 diabetes mellitus with hyperglycemia: Secondary | ICD-10-CM

## 2023-04-24 DIAGNOSIS — Z888 Allergy status to other drugs, medicaments and biological substances status: Secondary | ICD-10-CM

## 2023-04-24 DIAGNOSIS — R079 Chest pain, unspecified: Secondary | ICD-10-CM | POA: Diagnosis not present

## 2023-04-24 DIAGNOSIS — G8929 Other chronic pain: Secondary | ICD-10-CM | POA: Diagnosis present

## 2023-04-24 DIAGNOSIS — Z79899 Other long term (current) drug therapy: Secondary | ICD-10-CM

## 2023-04-24 DIAGNOSIS — R072 Precordial pain: Secondary | ICD-10-CM | POA: Diagnosis not present

## 2023-04-24 DIAGNOSIS — Z833 Family history of diabetes mellitus: Secondary | ICD-10-CM | POA: Diagnosis not present

## 2023-04-24 DIAGNOSIS — E119 Type 2 diabetes mellitus without complications: Secondary | ICD-10-CM | POA: Diagnosis not present

## 2023-04-24 DIAGNOSIS — E1100 Type 2 diabetes mellitus with hyperosmolarity without nonketotic hyperglycemic-hyperosmolar coma (NKHHC): Secondary | ICD-10-CM | POA: Diagnosis not present

## 2023-04-24 DIAGNOSIS — I25118 Atherosclerotic heart disease of native coronary artery with other forms of angina pectoris: Secondary | ICD-10-CM | POA: Diagnosis not present

## 2023-04-24 DIAGNOSIS — I200 Unstable angina: Secondary | ICD-10-CM | POA: Diagnosis not present

## 2023-04-24 DIAGNOSIS — Z7982 Long term (current) use of aspirin: Secondary | ICD-10-CM

## 2023-04-24 LAB — BASIC METABOLIC PANEL
Anion gap: 10 (ref 5–15)
BUN: 10 mg/dL (ref 6–20)
CO2: 22 mmol/L (ref 22–32)
Calcium: 9.5 mg/dL (ref 8.9–10.3)
Chloride: 105 mmol/L (ref 98–111)
Creatinine, Ser: 0.83 mg/dL (ref 0.44–1.00)
GFR, Estimated: >60 mL/min (ref >60)
Glucose, Bld: 156 mg/dL — ABNORMAL HIGH (ref 70–99)
Potassium: 3.6 mmol/L (ref 3.5–5.1)
Sodium: 137 mmol/L (ref 135–145)

## 2023-04-24 LAB — TROPONIN I (HIGH SENSITIVITY)
Troponin I (High Sensitivity): 4 ng/L (ref <18)
Troponin I (High Sensitivity): 3 ng/L (ref <18)

## 2023-04-24 LAB — TSH: TSH: 2.754 u[IU]/mL (ref 0.350–4.500)

## 2023-04-24 LAB — CBC
HCT: 45.2 % (ref 36.0–46.0)
Hemoglobin: 15 g/dL (ref 12.0–15.0)
MCH: 30.1 pg (ref 26.0–34.0)
MCHC: 33.2 g/dL (ref 30.0–36.0)
MCV: 90.8 fL (ref 80.0–100.0)
Platelets: 305 10*3/uL (ref 150–400)
RBC: 4.98 MIL/uL (ref 3.87–5.11)
RDW: 11.9 % (ref 11.5–15.5)
WBC: 7 10*3/uL (ref 4.0–10.5)
nRBC: 0 % (ref 0.0–0.2)

## 2023-04-24 LAB — SEDIMENTATION RATE: Sed Rate: 6 mm/h (ref 0–22)

## 2023-04-24 LAB — HIV ANTIBODY (ROUTINE TESTING W REFLEX): HIV Screen 4th Generation wRfx: NONREACTIVE

## 2023-04-24 LAB — C-REACTIVE PROTEIN: CRP: 0.5 mg/dL (ref <1.0)

## 2023-04-24 LAB — D-DIMER, QUANTITATIVE: D-Dimer, Quant: <0.27 ug{FEU}/mL (ref 0.00–0.50)

## 2023-04-24 LAB — HEPARIN LEVEL (UNFRACTIONATED): Heparin Unfractionated: 0.89 [IU]/mL — ABNORMAL HIGH (ref 0.30–0.70)

## 2023-04-24 LAB — MRSA NEXT GEN BY PCR, NASAL: MRSA by PCR Next Gen: NOT DETECTED

## 2023-04-24 MED ORDER — POTASSIUM CHLORIDE CRYS ER 20 MEQ PO TBCR
40.0000 meq | EXTENDED_RELEASE_TABLET | Freq: Once | ORAL | Status: AC
  Administered 2023-04-24: 40 meq via ORAL
  Filled 2023-04-24: qty 2

## 2023-04-24 MED ORDER — NITROGLYCERIN 2 % TD OINT
1.0000 [in_us] | TOPICAL_OINTMENT | Freq: Four times a day (QID) | TRANSDERMAL | Status: DC
  Administered 2023-04-24: 1 [in_us] via TOPICAL
  Filled 2023-04-24: qty 1

## 2023-04-24 MED ORDER — EZETIMIBE 10 MG PO TABS
10.0000 mg | ORAL_TABLET | Freq: Every evening | ORAL | Status: DC
  Administered 2023-04-24 – 2023-04-26 (×3): 10 mg via ORAL
  Filled 2023-04-24 (×3): qty 1

## 2023-04-24 MED ORDER — ASPIRIN 81 MG PO CHEW
324.0000 mg | CHEWABLE_TABLET | Freq: Once | ORAL | Status: AC
  Administered 2023-04-24: 324 mg via ORAL
  Filled 2023-04-24: qty 4

## 2023-04-24 MED ORDER — ACETAMINOPHEN 650 MG RE SUPP: 650.0000 mg | Freq: Four times a day (QID) | RECTAL | Status: DC | PRN

## 2023-04-24 MED ORDER — ONDANSETRON HCL 4 MG/2ML IJ SOLN: 4.0000 mg | Freq: Four times a day (QID) | INTRAMUSCULAR | Status: DC | PRN

## 2023-04-24 MED ORDER — ENOXAPARIN SODIUM 40 MG/0.4ML IJ SOSY
40.0000 mg | PREFILLED_SYRINGE | INTRAMUSCULAR | Status: DC
  Administered 2023-04-24: 40 mg via SUBCUTANEOUS
  Filled 2023-04-24: qty 0.4

## 2023-04-24 MED ORDER — SODIUM CHLORIDE 0.9% FLUSH
3.0000 mL | Freq: Two times a day (BID) | INTRAVENOUS | Status: DC
  Administered 2023-04-24 – 2023-04-26 (×5): 3 mL via INTRAVENOUS

## 2023-04-24 MED ORDER — ACETAMINOPHEN 325 MG PO TABS: 650.0000 mg | ORAL_TABLET | Freq: Four times a day (QID) | ORAL | Status: DC | PRN

## 2023-04-24 MED ORDER — MORPHINE SULFATE (PF) 4 MG/ML IV SOLN
4.0000 mg | Freq: Once | INTRAVENOUS | Status: AC | PRN
  Administered 2023-04-24: 4 mg via INTRAVENOUS
  Filled 2023-04-24: qty 1

## 2023-04-24 MED ORDER — HEPARIN (PORCINE) 25000 UT/250ML-% IV SOLN
900.0000 [IU]/h | INTRAVENOUS | Status: DC
  Administered 2023-04-24: 800 [IU]/h via INTRAVENOUS
  Administered 2023-04-25: 900 [IU]/h via INTRAVENOUS
  Filled 2023-04-24 (×2): qty 250

## 2023-04-24 MED ORDER — HEPARIN BOLUS VIA INFUSION
3900.0000 [IU] | Freq: Once | INTRAVENOUS | Status: AC
  Administered 2023-04-24: 3900 [IU] via INTRAVENOUS
  Filled 2023-04-24: qty 3900

## 2023-04-24 MED ORDER — NITROGLYCERIN 0.4 MG SL SUBL
0.4000 mg | SUBLINGUAL_TABLET | SUBLINGUAL | Status: DC | PRN
  Administered 2023-04-24 (×2): 0.4 mg via SUBLINGUAL
  Filled 2023-04-24 (×2): qty 1

## 2023-04-24 MED ORDER — HYDRALAZINE HCL 20 MG/ML IJ SOLN: 10.0000 mg | INTRAMUSCULAR | Status: DC | PRN

## 2023-04-24 MED ORDER — ASPIRIN 81 MG PO CHEW
81.0000 mg | CHEWABLE_TABLET | Freq: Every day | ORAL | Status: DC
  Administered 2023-04-25: 81 mg via ORAL
  Filled 2023-04-24: qty 1

## 2023-04-24 MED ORDER — ONDANSETRON HCL 4 MG PO TABS: 4.0000 mg | ORAL_TABLET | Freq: Four times a day (QID) | ORAL | Status: DC | PRN

## 2023-04-24 MED ORDER — MORPHINE SULFATE (PF) 2 MG/ML IV SOLN
2.0000 mg | INTRAVENOUS | Status: DC | PRN
  Administered 2023-04-24: 2 mg via INTRAVENOUS
  Filled 2023-04-24: qty 1

## 2023-04-24 MED ORDER — ALBUTEROL SULFATE (2.5 MG/3ML) 0.083% IN NEBU: 2.5000 mg | INHALATION_SOLUTION | Freq: Four times a day (QID) | RESPIRATORY_TRACT | Status: DC | PRN

## 2023-04-24 NOTE — Progress Notes (Signed)
VASCULAR LAB    Carotid duplex has been performed.  See CV proc for preliminary results.   Princ[place] Blizzard, RVT 04/24/2023, 5:20 PM

## 2023-04-24 NOTE — ED Notes (Signed)
ED TO INPATIENT HANDOFF REPORT  ED Nurse Name and Phone #: Percival Spanish [phone]  S Name/Age/Gender Jill Anderson 57 y.o. female Room/Bed: 004C/004C  Code Status   Code Status: Full Code  Home/SNF/Other Home Patient oriented to: self, place, time, and situation Is this baseline? Yes   Triage Complete: Triage complete  Chief Complaint Chest pain [R07.9]  Triage Note Pt c/o chest tightness and pressure that started approximately 3 hours ago. Pt states the pain is worsening and she feels short of breath. Pt has also had tingling in both hands and a headache.  Pt denies nausea/vomiting. Pt's mother died of heart attack at age 24.   Allergies Allergies  Allergen Reactions   Lipitor [Atorvastatin] Other (See Comments)    Myalgia    Glucotrol [Glipizide] Other (See Comments)    Burning in stomach    Level of Care/Admitting Diagnosis ED Disposition     ED Disposition  Admit   Condition  --   Comment  Hospital Area: [facility] [[id number]]  Level of Care: Telemetry Cardiac [[id number]]  May place patient in observation at [facility] or [facility] if equivalent level of care is available:: No  Covid Evaluation: Asymptomatic - no recent exposure (last 10 days) testing not required  Diagnosis: Chest pain [732202]  Admitting Physician: Clydie Braun [[id number]]  Attending Physician: Clydie Braun [[id number]]          B Medical/Surgery History Past Medical History:  Diagnosis Date   Borderline diabetic    Chronic back pain    Diabetes mellitus without complication (HCC)    Hypercholesteremia    Past Surgical History:  Procedure Laterality Date   ABDOMINAL HYSTERECTOMY     ABDOMINOPLASTY/PANNICULECTOMY WITH LIPOSUCTION  06/26/2022   Performed in [facility]   BACK SURGERY     CYSTOSCOPY WITH RETROGRADE PYELOGRAM, URETEROSCOPY AND STENT PLACEMENT Left 10/28/2015   Procedure: CYSTOSCOPY WITH RETROGRADE PYELOGRAM, URETEROSCOPY AND STENT PLACEMENT, AND  LASER;  Surgeon: Bjorn Pippin, MD;  Location: [facility];  Service: Urology;  Laterality: Left;     A IV Location/Drains/Wounds Patient Lines/Drains/Airways Status     Active Line/Drains/Airways     Name Placement date Placement time Site Days   Peripheral IV 04/24/23 20 G Right Forearm 04/24/23  0602  Forearm  less than 1   Ureteral Drain/Stent Left ureter 6 Fr. 10/28/15  1310  Left ureter  2735            Intake/Output Last 24 hours No intake or output data in the 24 hours ending 04/24/23 1046  Labs/Imaging Results for orders placed or performed during the hospital encounter of 04/24/23 (from the past 48 hour(s))  Basic metabolic panel     Status: Abnormal   Collection Time: 04/24/23  5:13 AM  Result Value Ref Range   Sodium 137 135 - 145 mmol/L   Potassium 3.6 3.5 - 5.1 mmol/L   Chloride 105 98 - 111 mmol/L   CO2 22 22 - 32 mmol/L   Glucose, Bld 156 (H) 70 - 99 mg/dL    Comment: Glucose reference range applies only to samples taken after fasting for at least 8 hours.   BUN 10 6 - 20 mg/dL   Creatinine, Ser 5.17 0.44 - 1.00 mg/dL   Calcium 9.5 8.9 - 61.6 mg/dL   GFR, Estimated >07 >37 mL/min    Comment: (NOTE) Calculated using the CKD-EPI Creatinine Equation (2021)    Anion gap 10 5 - 15    Comment:  Performed at [facility], [address], [place], [place] [address]  CBC     Status: None   Collection Time: 04/24/23  5:13 AM  Result Value Ref Range   WBC 7.0 4.0 - 10.5 K/uL   RBC 4.98 3.87 - 5.11 MIL/uL   Hemoglobin 15.0 12.0 - 15.0 g/dL   HCT 75.6 43.3 - 29.5 %   MCV 90.8 80.0 - 100.0 fL   MCH 30.1 26.0 - 34.0 pg   MCHC 33.2 30.0 - 36.0 g/dL   RDW 18.8 41.6 - 60.6 %   Platelets 305 150 - 400 K/uL   nRBC 0.0 0.0 - 0.2 %    Comment: Performed at [facility], [address] [place], [place] [address]  Troponin I (High Sensitivity)     Status: None   Collection Time: 04/24/23  5:13 AM  Result Value Ref Range   Troponin I (High Sensitivity) 4 <18 ng/L     Comment: (NOTE) Elevated high sensitivity troponin I (hsTnI) values and significant  changes across serial measurements may suggest ACS but many other  chronic and acute conditions are known to elevate hsTnI results.  Refer to the "Links" section for chest pain algorithms and additional  guidance. Performed at [facility], [address], [place], [place] [address]   Troponin I (High Sensitivity)     Status: None   Collection Time: 04/24/23  6:52 AM  Result Value Ref Range   Troponin I (High Sensitivity) 3 <18 ng/L    Comment: (NOTE) Elevated high sensitivity troponin I (hsTnI) values and significant  changes across serial measurements may suggest ACS but many other  chronic and acute conditions are known to elevate hsTnI results.  Refer to the "Links" section for chest pain algorithms and additional  guidance. Performed at [facility], [address] [place], [place] [address]   D-dimer, quantitative     Status: None   Collection Time: 04/24/23  9:02 AM  Result Value Ref Range   D-Dimer, Quant <0.27 0.00 - 0.50 ug/mL-FEU    Comment: (NOTE) At the manufacturer cut-off value of 0.5 g/mL FEU, this assay has a negative predictive value of 95-100%.This assay is intended for use in conjunction with a clinical pretest probability (PTP) assessment model to exclude pulmonary embolism (PE) and deep venous thrombosis (DVT) in outpatients suspected of PE or DVT. Results should be correlated with clinical presentation. Performed at [facility], [address], [place], [place] [address]   Sedimentation rate     Status: None   Collection Time: 04/24/23  9:02 AM  Result Value Ref Range   Sed Rate 6 0 - 22 mm/hr    Comment: Performed at [facility], [address], [place], [place] [address]  C-reactive protein     Status: None   Collection Time: 04/24/23  9:02 AM  Result Value Ref Range   CRP 0.5 <1.0 mg/dL    Comment: Performed at [facility], [address] [place], [place] [address]   DG Chest 2 View  Result Date: 04/24/2023 CLINICAL DATA:  57 year old female with chest pain. EXAM: CHEST - 2 VIEW COMPARISON:  CT chest 06/13/2021 and earlier. FINDINGS: PA and lateral views 0513 hours. Lung volumes are stable and within normal limits. Visualized tracheal air column is within normal limits. Lung markings appear stable since 2016, within normal limits. Both lungs appear clear. No pneumothorax or pleural effusion. Negative visible  bowel gas and osseous structures. IMPRESSION: Negative.  No acute cardiopulmonary abnormality. Electronically Signed   By: Odessa Fleming M.D.   On: 04/24/2023 05:37    Pending Labs Unresulted Labs (From admission, onward)     Start     Ordered   04/25/23 0500  CBC  Tomorrow morning,   R        04/24/23 0917   04/25/23 0500  Basic metabolic panel  Tomorrow morning,   R        04/24/23 0917   04/24/23 0914  HIV Antibody (routine testing w rflx)  (HIV Antibody (Routine testing w reflex) panel)  Once,   R        04/24/23 0917            Vitals/Pain Today's Vitals   04/24/23 0857 04/24/23 0900 04/24/23 1000 04/24/23 1006  BP:  (!) 141/79 135/78   Pulse:  (!) 58 60   Resp:  15 12   Temp: 97.8 F (36.6 C)     TempSrc: Oral     SpO2:  100% 100%   Weight:      Height:      PainSc:    7     Isolation Precautions No active isolations  Medications Medications  nitroGLYCERIN (NITROSTAT) SL tablet 0.4 mg (0.4 mg Sublingual Given 04/24/23 1006)  nitroGLYCERIN (NITROGLYN) 2 % ointment 1 inch (has no administration in time range)  enoxaparin (LOVENOX) injection 40 mg (40 mg Subcutaneous Given 04/24/23 0943)  sodium chloride flush (NS) 0.9 % injection 3 mL (3 mLs Intravenous Given 04/24/23 0940)  acetaminophen (TYLENOL) tablet 650 mg (has no administration in time range)    Or  acetaminophen (TYLENOL) suppository 650 mg (has no administration in time range)  ondansetron (ZOFRAN) tablet 4 mg (has no  administration in time range)    Or  ondansetron (ZOFRAN) injection 4 mg (has no administration in time range)  albuterol (PROVENTIL) (2.5 MG/3ML) 0.083% nebulizer solution 2.5 mg (has no administration in time range)  hydrALAZINE (APRESOLINE) injection 10 mg (has no administration in time range)  aspirin chewable tablet 324 mg (324 mg Oral Given 04/24/23 0606)  morphine (PF) 4 MG/ML injection 4 mg (4 mg Intravenous Given 04/24/23 0606)    Mobility walks     Focused Assessments Cardiac Assessment Handoff:  Cardiac Rhythm: Normal sinus rhythm Lab Results  Component Value Date   TROPONINI <0.03 04/06/2015   Lab Results  Component Value Date   DDIMER <0.27 04/24/2023   Does the Patient currently have chest pain? Yes    R Recommendations: See Admitting Provider Note  Report given to:   Additional Notes:

## 2023-04-24 NOTE — Consult Note (Signed)
Cardiology Consultation   Patient ID: Jill Anderson MRN: [id number]; DOB: 1966/01/24  Admit date: 04/24/2023 Date of Consult: 04/24/2023  PCP:  Loyola Mast, MD   [facility] Providers Cardiologist:  Reatha Harps, MD        Patient Profile:   Jill Anderson is a 57 y.o. female with a hx of HLD, Type 2 DM, carotid artery stenosis and family history of CAD who is being seen 04/24/2023 for the evaluation of chest pain at the request of Dr. Madilyn Hook.  History of Present Illness:   Jill Anderson presented to [facility] ED this morning for evaluation of chest pain. In talking with the patient today, she reports being in her normal state of health until yesterday when she developed episodes of chest pain. She reports this was initially a "stabbing sensation" and episodes would last for 5 to 10 minutes and spontaneously resolve. Overnight and this morning, she has developed a more constant chest pressure. Has been present for several hours and is exacerbated with taking a deep breath. No association with positional changes or with exertion.  She has noticed worsening shortness of breath, lightheadedness and a headache. She is concerned about taking nitroglycerin as this has caused a worsening headache in the past. She is unaware of any personal history of CAD but reports her mother died of a massive heart attack in her 81's. Patient reports an autopsy was performed to confirm this. The patient reports she has known carotid artery stenosis which was diagnosed 13+ years ago but no recent assessment. No recent illnesses or sick contacts. No recent travel. She denies any prior tobacco use. Does consume occasional alcohol on a social basis but no binge drinking.  Initial labs while in the ED showed WBC 7.0, Hgb 15.0, platelets 305, Na+ 137, K+ 3.6 and creatinine 0.83. Initial and repeat Hs troponin values have been negative at 4 and 3. CXR with no acute cardiopulmonary abnormalities. EKG shows  normal sinus rhythm, heart rate 75 with slight ST depression along the inferior leads.   She did receive 4 mg IV Morphine and reports this did not change her pain. Also received ASA 324 mg daily.  Past Medical History:  Diagnosis Date   Borderline diabetic    Chronic back pain    Diabetes mellitus without complication (HCC)    Hypercholesteremia     Past Surgical History:  Procedure Laterality Date   ABDOMINAL HYSTERECTOMY     ABDOMINOPLASTY/PANNICULECTOMY WITH LIPOSUCTION  06/26/2022   Performed in [facility]   BACK SURGERY     CYSTOSCOPY WITH RETROGRADE PYELOGRAM, URETEROSCOPY AND STENT PLACEMENT Left 10/28/2015   Procedure: CYSTOSCOPY WITH RETROGRADE PYELOGRAM, URETEROSCOPY AND STENT PLACEMENT, AND LASER;  Surgeon: Bjorn Pippin, MD;  Location: [facility];  Service: Urology;  Laterality: Left;     Home Medications:  Prior to Admission medications   Medication Sig Start Date End Date Taking? Authorizing Provider  Accu-Chek FastClix Lancets MISC USE AS DIRECTED to check blood sugar once daily. 12/22/19   Hoy Register, MD  acetaminophen-codeine (TYLENOL #3) 300-30 MG tablet TAKE 1 TABLET BY MOUTH EVERY 8 HOURS AS NEEDED FOR MODERATE PAIN 02/12/23   Loyola Mast, MD  aspirin EC 81 MG tablet Take 1 tablet (81 mg total) by mouth daily. 09/29/16   Funches, Gerilyn Nestle, MD  azelastine (ASTELIN) 0.1 % nasal spray Place 2 sprays into both nostrils 2 (two) times daily. 09/29/19   Cathie Hoops, Amy V, PA-C  Blood Glucose Monitoring Suppl (ACCU-CHEK  GUIDE ME) w/Device KIT 1 each by Does not apply route daily. 06/13/18   Hoy Register, MD  Elastic Bandages & Supports (TRUFORM ARM SLEEVE M 15-20MMHG) MISC 1 each by Does not apply route daily. 03/20/16   Funches, Gerilyn Nestle, MD  ezetimibe (ZETIA) 10 MG tablet Take 1 tablet (10 mg total) by mouth daily. 11/02/22 10/28/23  Loyola Mast, MD  fluticasone (FLONASE) 50 MCG/ACT nasal spray Place 1 spray into both nostrils in the morning and at bedtime. 03/19/23   Loyola Mast, MD  glucose blood (ACCU-CHEK GUIDE) test strip USE 1  ONCE DAILY FOR BLOOD SUGAR 05/20/22   Loyola Mast, MD  hydrocortisone (ANUSOL-HC) 25 MG suppository Place 1 suppository (25 mg total) rectally 2 (two) times daily. 03/19/20   Hall-Potvin, [place], PA-C  ibuprofen (ADVIL) 800 MG tablet Take 1 tablet (800 mg total) by mouth every 12 (twelve) hours as needed. 04/30/22   Loyola Mast, MD  methocarbamol (ROBAXIN) 500 MG tablet Take 1 tablet (500 mg total) by mouth 2 (two) times daily as needed for muscle spasms. 04/18/21   Terrilee Files, MD  Misc. Devices (CANE) MISC 1 each by Does not apply route as needed. 03/04/15   Funches, Gerilyn Nestle, MD  naproxen (NAPROSYN) 500 MG tablet Take 1 tablet (500 mg total) by mouth 2 (two) times daily with a meal. 03/19/23   Loyola Mast, MD  pantoprazole (PROTONIX) 40 MG tablet Take 1 tablet (40 mg total) by mouth daily. 10/09/21   Terrilee Files, MD  psyllium (METAMUCIL) 58.6 % powder Take 1 packet by mouth 2 (two) times daily.    [provider]    Inpatient Medications: Scheduled Meds:  Continuous Infusions:  PRN Meds: nitroGLYCERIN  Allergies:    Allergies  Allergen Reactions   Atorvastatin Other (See Comments)    myalgia   Glipizide Other (See Comments)    Burning in stomach    Social History:   Social History   Socioeconomic History   Marital status: Single    Spouse name: Not on file   Number of children: 3   Years of education: Not on file   Highest education level: Not on file  Occupational History   Occupation: Disabled  Tobacco Use   Smoking status: Never   Smokeless tobacco: Never  Vaping Use   Vaping status: Never Used  Substance and Sexual Activity   Alcohol use: No   Drug use: Never   Sexual activity: Not Currently  Other Topics Concern   Not on file  Social History Narrative   Not on file    Family History:    Family History  Problem Relation Age of Onset   Diabetes Mother     Hypertension Mother    Heart disease Mother        based at age 64 of MI   Diabetes Father    Hypertension Father    Heart disease Father    Cancer Maternal Aunt        liver   Colon cancer Neg Hx    Esophageal cancer Neg Hx    Stomach cancer Neg Hx      ROS:  Please see the history of present illness.   All other ROS reviewed and negative.     Physical Exam/Data:   Vitals:   04/24/23 0656 04/24/23 0658 04/24/23 0700 04/24/23 0701  BP:   138/69   Pulse: (!) 55 (!) 59 (!) 59 61  Resp: 13 13  20 12  Temp:      SpO2: 100% 100% 100% 100%  Weight:      Height:       No intake or output data in the 24 hours ending 04/24/23 0820    04/24/2023    5:05 AM 03/19/2023   10:27 AM 03/01/2023   10:11 AM  Last 3 Weights  Weight (lbs) 156 lb 157 lb 6.4 oz --  Weight (kg) 70.761 kg 71.396 kg --     Body mass index is 28.53 kg/m.  General:  Well nourished, well developed female appearing in no acute distress. HEENT: normal Neck: no JVD Vascular: No carotid bruits; Distal pulses 2+ bilaterally Cardiac:  normal S1, S2; RRR; no murmur  Lungs:  clear to auscultation bilaterally, no wheezing, rhonchi or rales  Abd: soft, nontender, no hepatomegaly  Ext: no pitting edema Musculoskeletal:  No deformities, BUE and BLE strength normal and equal Skin: warm and dry  Neuro:  CNs 2-12 intact, no focal abnormalities noted Psych:  Normal affect   EKG:  The EKG was personally reviewed and demonstrates: Normal sinus rhythm, heart rate 75 with slight ST depression along the inferior leads.  Telemetry:  Telemetry was personally reviewed and demonstrates: NSR, HR in 50's to 60's with occasional PVC's.  Relevant CV Studies:  Echocardiogram: Pending  Laboratory Data:  High Sensitivity Troponin:   Recent Labs  Lab 04/24/23 0513 04/24/23 0652  TROPONINIHS 4 3     Chemistry Recent Labs  Lab 04/24/23 0513  NA 137  K 3.6  CL 105  CO2 22  GLUCOSE 156*  BUN 10  CREATININE 0.83   CALCIUM 9.5  GFRNONAA >60  ANIONGAP 10    No results for input(s): "PROT", "ALBUMIN", "AST", "ALT", "ALKPHOS", "BILITOT" in the last 168 hours. Lipids No results for input(s): "CHOL", "TRIG", "HDL", "LABVLDL", "LDLCALC", "CHOLHDL" in the last 168 hours.  Hematology Recent Labs  Lab 04/24/23 0513  WBC 7.0  RBC 4.98  HGB 15.0  HCT 45.2  MCV 90.8  MCH 30.1  MCHC 33.2  RDW 11.9  PLT 305   Thyroid No results for input(s): "TSH", "FREET4" in the last 168 hours.  BNPNo results for input(s): "BNP", "PROBNP" in the last 168 hours.  DDimer No results for input(s): "DDIMER" in the last 168 hours.   Radiology/Studies:  DG Chest 2 View  Result Date: 04/24/2023 CLINICAL DATA:  57 year old female with chest pain. EXAM: CHEST - 2 VIEW COMPARISON:  CT chest 06/13/2021 and earlier. FINDINGS: PA and lateral views 0513 hours. Lung volumes are stable and within normal limits. Visualized tracheal air column is within normal limits. Lung markings appear stable since 2016, within normal limits. Both lungs appear clear. No pneumothorax or pleural effusion. Negative visible bowel gas and osseous structures. IMPRESSION: Negative.  No acute cardiopulmonary abnormality. Electronically Signed   By: Odessa Fleming M.D.   On: 04/24/2023 05:37     Assessment and Plan:   1. Chest Pain with Mixed Features - Her episodes of chest pain have mixed qualities as this does radiate into her neck and down her left arm at times but occurs at rest and is worse with inspiration. She also has several cardiac risk factors including family history of CAD, HLD and Type II DM. - Cardiac enzymes have been negative and her EKG shows slight ST depression along the inferior leads but no diagnostic changes. Would recommend checking a D-dimer, ESR and CRP given the pleuritic component of her symptoms. Also obtain  an echocardiogram today. - If follow-up labs are reassuring and she continues to have episodes of chest pain, would anticipate  proceeding with a Coronary CTA or cardiac catheterization this admission (prior Chest CT in 06/2021 showed no definitive coronary artery calcification). Continue ASA and Zetia. No beta-blocker given baseline HR in the 50's. Will try adding NTG ointment to see if this helps with her pain.   2. HLD - LDL was at 123 in 10/2022. She has been followed by Dr. Rennis Golden given her statin intolerances and has remained on Zetia 10 mg daily. If found to have coronary artery disease this admission, would need referral back for consideration of PCSK9 inhibitor therapy.  3. Carotid Artery Stenosis/Dizziness - She reports progressive dizziness and episodes of lightheadedness. Reports having a history of carotid artery stenosis diagnosed 13+ years ago but no recent assessment. Will order repeat carotid dopplers.  4. Type II DM - Hemoglobin A1c was elevated at 6.7 in 03/2023 with lifestyle modification recommended at that time. She is not currently on medications for this.   For questions or updates, please contact [facility] Please consult [email or web address] for contact info under    Signed, Ellsworth Lennox, PA-C  04/24/2023 8:20 AM

## 2023-04-24 NOTE — H&P (Signed)
History and Physical    Patient: Jill Anderson JJO:[id number] DOB: Jun 26, 1965 DOA: 04/24/2023 DOS: the patient was seen and examined on 04/24/2023 PCP: Loyola Mast, MD  Patient coming from: Home via EMS Chief Complaint:  Chief Complaint  Patient presents with   Chest Pain   HPI: Jill Anderson is a 57 y.o. female with medical history significant of hypertension, dyslipidemia, diabetes mellitus type 2 who presents with complaints of  sharp chest pressure that started last night around 2 AM.  Symptoms woke the patient from sleep. The patient reports experiencing minor chest pains for two days prior. Associated symptoms include lightheadedness, pressure in the head, pain radiating down the arm, and tingling in the fingers. The patient denies sweating, nausea, or vomiting, but reports having loose stools twice yesterday. No abdominal pain is noted. The patient also mentions frequent heart fluttering sensations and a history of leg cramps.  Denies any recent strenuous exercise or heavy lifting.  The patient has a significant family history of heart disease, with the mother dying at age 66 from a massive heart attack and the father having heart problems. The patient reports first experiencing heart issues in their sixties. There is no recent history of prolonged sitting or travel. The patient denies any history of smoking or alcohol use.  In the emergency department patient was noted to be afebrile with pulse 55-78, blood pressures elevated up to 159/90, and all other vital signs maintained.  Labs are relatively reassuring with high-sensitivity troponins negative x 2.  EKG without significant ischemic changes. Chest pain chest x-ray noted no acute abnormality.  Patient had been given full dose aspirin and 4 mg of morphine IV without improvement in symptoms.  She had declined nitroglycerin due to already having headache symptoms.   Cardiology has started patient on heparin drip.   Review of Systems: As  mentioned in the history of present illness. All other systems reviewed and are negative. Past Medical History:  Diagnosis Date   Borderline diabetic    Chronic back pain    Diabetes mellitus without complication (HCC)    Hypercholesteremia    Past Surgical History:  Procedure Laterality Date   ABDOMINAL HYSTERECTOMY     ABDOMINOPLASTY/PANNICULECTOMY WITH LIPOSUCTION  06/26/2022   Performed in [facility] [place]   BACK SURGERY     CYSTOSCOPY WITH RETROGRADE PYELOGRAM, URETEROSCOPY AND STENT PLACEMENT Left 10/28/2015   Procedure: CYSTOSCOPY WITH RETROGRADE PYELOGRAM, URETEROSCOPY AND STENT PLACEMENT, AND LASER;  Surgeon: Bjorn Pippin, MD;  Location: [facility];  Service: Urology;  Laterality: Left;   Social History:  reports that she has never smoked. She has never used smokeless tobacco. She reports that she does not drink alcohol and does not use drugs.  Allergies  Allergen Reactions   Atorvastatin Other (See Comments)    myalgia   Glipizide Other (See Comments)    Burning in stomach    Family History  Problem Relation Age of Onset   Diabetes Mother    Hypertension Mother    Heart disease Mother        based at age 79 of MI   Diabetes Father    Hypertension Father    Heart disease Father    Cancer Maternal Aunt        liver   Colon cancer Neg Hx    Esophageal cancer Neg Hx    Stomach cancer Neg Hx     Prior to Admission medications   Medication Sig Start Date End Date Taking? Authorizing Provider  Accu-Chek FastClix Lancets MISC USE AS DIRECTED to check blood sugar once daily. 12/22/19   Hoy Register, MD  acetaminophen-codeine (TYLENOL #3) 300-30 MG tablet TAKE 1 TABLET BY MOUTH EVERY 8 HOURS AS NEEDED FOR MODERATE PAIN 02/12/23   Loyola Mast, MD  aspirin EC 81 MG tablet Take 1 tablet (81 mg total) by mouth daily. 09/29/16   Funches, Gerilyn Nestle, MD  azelastine (ASTELIN) 0.1 % nasal spray Place 2 sprays into both nostrils 2 (two) times daily. 09/29/19   Cathie Hoops, Amy V, PA-C  Blood  Glucose Monitoring Suppl (ACCU-CHEK GUIDE ME) w/Device KIT 1 each by Does not apply route daily. 06/13/18   Hoy Register, MD  Elastic Bandages & Supports (TRUFORM ARM SLEEVE M 15-20MMHG) MISC 1 each by Does not apply route daily. 03/20/16   Funches, Gerilyn Nestle, MD  ezetimibe (ZETIA) 10 MG tablet Take 1 tablet (10 mg total) by mouth daily. 11/02/22 10/28/23  Loyola Mast, MD  fluticasone (FLONASE) 50 MCG/ACT nasal spray Place 1 spray into both nostrils in the morning and at bedtime. 03/19/23   Loyola Mast, MD  glucose blood (ACCU-CHEK GUIDE) test strip USE 1  ONCE DAILY FOR BLOOD SUGAR 05/20/22   Loyola Mast, MD  hydrocortisone (ANUSOL-HC) 25 MG suppository Place 1 suppository (25 mg total) rectally 2 (two) times daily. 03/19/20   Hall-Potvin, [place], PA-C  ibuprofen (ADVIL) 800 MG tablet Take 1 tablet (800 mg total) by mouth every 12 (twelve) hours as needed. 04/30/22   Loyola Mast, MD  methocarbamol (ROBAXIN) 500 MG tablet Take 1 tablet (500 mg total) by mouth 2 (two) times daily as needed for muscle spasms. 04/18/21   Terrilee Files, MD  Misc. Devices (CANE) MISC 1 each by Does not apply route as needed. 03/04/15   Funches, Gerilyn Nestle, MD  naproxen (NAPROSYN) 500 MG tablet Take 1 tablet (500 mg total) by mouth 2 (two) times daily with a meal. 03/19/23   Loyola Mast, MD  pantoprazole (PROTONIX) 40 MG tablet Take 1 tablet (40 mg total) by mouth daily. 10/09/21   Terrilee Files, MD  psyllium (METAMUCIL) 58.6 % powder Take 1 packet by mouth 2 (two) times daily.    [provider]    Physical Exam: Vitals:   04/24/23 0656 04/24/23 0658 04/24/23 0700 04/24/23 0701  BP:   138/69   Pulse: (!) 55 (!) 59 (!) 59 61  Resp: 13 13 20 12   Temp:      SpO2: 100% 100% 100% 100%  Weight:      Height:        Constitutional: Middle-aged female currently no acute distress Eyes: PERRL, lids and conjunctivae normal ENMT: Mucous membranes are moist. Normal dentition.  Neck: normal,  supple, no masses, no JVD Respiratory: clear to auscultation bilaterally, no wheezing, no crackles. Normal respiratory effort. No accessory muscle use.  Cardiovascular: Regular rate and rhythm, no murmurs / rubs / gallops. .  Abdomen: no tenderness, no masses palpated. Bowel sounds positive.  Musculoskeletal: no clubbing / cyanosis. No joint deformity upper and lower extremities. Good ROM, no contractures. Normal muscle tone.  Skin: no rashes, lesions, ulcers. No induration Neurologic: CN 2-12 grossly intact. Strength 5/5 in all 4.  Psychiatric: Normal judgment and insight. Alert and oriented x 3. Normal mood.   Data Reviewed:  EKG revealed normal sinus rhythm at 75 bpm with ST wave changes noted 2 3, and aVF.  Reviewed labs, imaging, and pertinent records as documented..  Assessment and Plan:  Chest pain Family history of coronary artery disease Patient presents with sharp chest pressure that started two nights ago at 2 AM, accompanied by lightheadedness. Pain radiates down the arm with tingling in fingers. Patient also reports head pressure. No associated sweating, nausea, or vomiting. Family history significant for mother's death from massive heart attack at 101 and father with heart problems. EKG did not show significant concerns for ischemia. Troponin levels were negative on two checks. -Admit to a cardiac telemetry bed -Continue heparin drip per pharmacy -Follow-up D-dimer, CRP, and sed rate  -Check lipid panel -Check echocardiogram -Continue aspirin -Morphine IV as needed for pain -Follow-up telemetry -Appreciate cardiology consultative services, we will follow-up for any further recommendations  Palpitations Patient reports frequent heart fluttering. No previous evaluation with Holter monitor. -Follow-up telemetry overnight -Message sent to Salley Hews to arrange outpatient Holter monitor evaluation  Controlled diabetes mellitus type 2, without long-term use of insulin On  admission glucose 156.  Last hemoglobin A1c noted to be 6.7 when checked 03/20/2023.  Patient not on any medication for treatment.  Hyperlipidemia Report prior inability to tolerate atorvastatin due to myalgias.  Last LDL noted to be 123 when checked on 11/02/2022. -Follow-up lipid panel  Overweight BMI 28.53 kg/m   DVT prophylaxis: Heparin Advance Care Planning:   Code Status: Full Code   Consults: Cardiology  Family Communication: Family updated at bedside  Severity of Illness: The appropriate patient status for this patient is OBSERVATION. Observation status is judged to be reasonable and necessary in order to provide the required intensity of service to ensure the patient's safety. The patient's presenting symptoms, physical exam findings, and initial radiographic and laboratory data in the context of their medical condition is felt to place them at decreased risk for further clinical deterioration. Furthermore, it is anticipated that the patient will be medically stable for discharge from the hospital within 2 midnights of admission.   Author: Clydie Braun, MD 04/24/2023 8:17 AM  For on call review [email or web address].

## 2023-04-24 NOTE — Progress Notes (Signed)
PHARMACY - ANTICOAGULATION CONSULT NOTE  Pharmacy Consult for heparin  Indication: chest pain/ACS  Allergies  Allergen Reactions   Lipitor [Atorvastatin] Other (See Comments)    Myalgia    Glucotrol [Glipizide] Other (See Comments)    Burning in stomach    Patient Measurements: Height: 5\' 2"  (157.5 cm) Weight: 70.8 kg (156 lb) IBW/kg (Calculated) : 50.1 Heparin Dosing Weight: 65 kg   Vital Signs: Temp: 97.8 F (36.6 C) (11/23 1318) Temp Source: Oral (11/23 1318) BP: 114/74 (11/23 1400) Pulse Rate: 75 (11/23 1318)  Labs: Recent Labs    04/24/23 0513 04/24/23 0652  HGB 15.0  --   HCT 45.2  --   PLT 305  --   CREATININE 0.83  --   TROPONINIHS 4 3    Estimated Creatinine Clearance: 68.1 mL/min (by C-G formula based on SCr of 0.83 mg/dL).   Medical History: Past Medical History:  Diagnosis Date   Borderline diabetic    Chronic back pain    Diabetes mellitus without complication (HCC)    Hypercholesteremia     Assessment: Jill Anderson presenting [facility] ED with chest pain. Pharmacy consulted to dose heparin d/t concern for ACS/STEMI. No anticoagulants PTA. Patient received a prophylactic dose of lovenox 40 mg at ~100 11/23.   Goal of Therapy:  Heparin level 0.3-0.7 units/ml Monitor platelets by anticoagulation protocol: Yes   Plan:  Give 3,900 units bolus x 1 Start heparin infusion at 800 units/hr Check anti-Xa level in 6 hours and daily while on heparin Continue to monitor H&H and platelets  Jani Gravel, PharmD Clinical Pharmacist  04/24/2023 3:00 PM

## 2023-04-24 NOTE — ED Provider Notes (Signed)
  Physical Exam  BP 138/69   Pulse 61   Temp 97.6 F (36.4 C)   Resp 12   Ht 5\' 2"  (1.575 m)   Wt 70.8 kg   SpO2 100%   BMI 28.53 kg/m   Physical Exam  Procedures  Procedures  ED Course / MDM   Clinical Course as of 04/24/23 0826  Sat Apr 24, 2023  0617 DG Chest 2 View No obvious abnormality [RB]  0865 Needs repeat trop. CP awakening her from sleep. Strong family history. If not 100% pain controlled after repeat trop then consult cards, admit for observation unless cards feels differently.  [CG]  [id number] HTN, DM, HLD, early family history [CG]  0736 Currently rates pain 7/10 after morphine, no real relief. Also complaining of headache, states she does not want to have nitroglycerin. History of HLD, DM.  [CG]  0809 Mallipeddi [CG]    Clinical Course User Index [CG] Al Decant, PA-C [RB] Roxy Horseman, PA-C   Medical Decision Making Amount and/or Complexity of Data Reviewed Labs: ordered. Radiology: ordered. Decision-making details documented in ED Course.  Risk OTC drugs. Prescription drug management.   57 year old female signed out to me at shift change pending repeat troponin and reassessment.  Please see previous provider note for further details.  In short, 57 year old female with medical history of hypertension, hyperlipidemia, diabetes, currently seeing Dr. Rennis Golden of cardiology for hyperlipidemia.  Patient presents to ED for evaluation of sudden onset of chest pain described 10 out of 10 and pressure on the left side of her chest at 2 AM waking her from her sleep.  Patient reports history of the same but never this bad.  Patient mother died from MI at 47.  The patient was signed out to me pending repeat troponin and reassessment.  Repeat troponin flat at 3.  Her EKG is nonischemic.  She still complains of left-sided persistent chest pressure after 4 mg of morphine.  She is refusing nitroglycerin secondary to already having a headache and stating that it  would just make her headache worse.  Consulted with Dr. Jenene Slicker of cardiology who states that they will consult on the patient.  Will plan to admit for observation due to persistent chest pressure and high risk patient with early family history, hypertension, diabetes, hyperlipidemia.Marland Kitchen  Heart score 5.  Discussed with Dr. Katrinka Blazing of [facility] hospitalist who has agreed to admit the patient for observation and cardiology consult.  Patient amenable to plan.       Al Decant, PA-C 04/24/23 7846    Linwood Dibbles, MD 04/24/23 [phone]

## 2023-04-24 NOTE — Progress Notes (Signed)
PHARMACY - ANTICOAGULATION CONSULT NOTE  Pharmacy Consult for heparin  Indication: chest pain/ACS  Allergies  Allergen Reactions   Lipitor [Atorvastatin] Other (See Comments)    Myalgia    Glucotrol [Glipizide] Other (See Comments)    Burning in stomach    Patient Measurements: Height: 5\' 2"  (157.5 cm) Weight: 70.8 kg (156 lb) IBW/kg (Calculated) : 50.1 Heparin Dosing Weight: 65 kg   Vital Signs: Temp: 96.2 F (35.7 C) (11/23 1900) Temp Source: Oral (11/23 1900) BP: 122/62 (11/23 1900) Pulse Rate: 68 (11/23 1900)  Labs: Recent Labs    04/24/23 0513 04/24/23 0652 04/24/23 2130  HGB 15.0  --   --   HCT 45.2  --   --   PLT 305  --   --   HEPARINUNFRC  --   --  0.89*  CREATININE 0.83  --   --   TROPONINIHS 4 3  --     Estimated Creatinine Clearance: 68.1 mL/min (by C-G formula based on SCr of 0.83 mg/dL).   Medical History: Past Medical History:  Diagnosis Date   Borderline diabetic    Chronic back pain    Diabetes mellitus without complication (HCC)    Hypercholesteremia     Assessment: 78 YOF presenting to [facility] ED with chest pain. Pharmacy consulted to dose heparin d/t concern for ACS/STEMI. No anticoagulants PTA.   -heparin level= 0.89 on 800 units/hr  Goal of Therapy:  Heparin level 0.3-0.7 units/ml Monitor platelets by anticoagulation protocol: Yes   Plan:  -decrease heparin to 650 units/hr -heparin level in 6 hrs  Harland German, PharmD Clinical Pharmacist **Pharmacist phone directory can now be found on [email or web address] ([email or web address]).  Listed under [facility].

## 2023-04-24 NOTE — ED Triage Notes (Addendum)
Pt c/o chest tightness and pressure that started approximately 3 hours ago. Pt states the pain is worsening and she feels short of breath. Pt has also had tingling in both hands and a headache.  Pt denies nausea/vomiting. Pt's mother died of heart attack at age 58.

## 2023-04-24 NOTE — ED Provider Notes (Signed)
[facility] Emergency Department Provider Note MRN:  [id number]  Arrival date & time: 04/24/23     Chief Complaint   Chest Pain   History of Present Illness   Jill Anderson is a 57 y.o. year-old female presents to the ED with chief complaint of left sided chest pain that awakened her from sleep.  She reports associated SOB.  She states that the pain radiates into her jaw.  She states that she has had some tingling sensation in both hands.  She reports associated headache.  She states that she has felt like this before, but never this bad.  The symptoms awakened her from sleep. She states that her mother died of an MI at 33.  Denies any treatments PTA.    Cardiac risk factors: HL, DM, HTN, atherosclerosis, family hx of early heart disease.  History provided by patient.   Review of Systems  Pertinent positive and negative review of systems noted in HPI.    Physical Exam   Vitals:   04/24/23 0505  BP: (!) 159/90  Pulse: 78  Resp: 16  Temp: 97.6 F (36.4 C)  SpO2: 100%    CONSTITUTIONAL:  anxious-appearing, NAD NEURO:  Alert and oriented x 3, CN 3-12 grossly intact EYES:  eyes equal and reactive ENT/NECK:  Supple, no stridor  CARDIO:  normal rate, regular rhythm, appears well-perfused  PULM:  No respiratory distress, CTAB GI/GU:  non-distended,  MSK/SPINE:  No gross deformities, no edema, moves all extremities  SKIN:  no rash, atraumatic   *Additional and/or pertinent findings included in MDM below  Diagnostic and Interventional Summary    EKG Interpretation Date/Time:    Ventricular Rate:    PR Interval:    QRS Duration:    QT Interval:    QTC Calculation:   R Axis:      Text Interpretation:         Labs Reviewed  CBC  BASIC METABOLIC PANEL  TROPONIN I (HIGH SENSITIVITY)    DG Chest 2 View  Final Result      Medications  nitroGLYCERIN (NITROSTAT) SL tablet 0.4 mg (has no administration in time range)  aspirin chewable  tablet 324 mg (324 mg Oral Given 04/24/23 0606)  morphine (PF) 4 MG/ML injection 4 mg (4 mg Intravenous Given 04/24/23 0606)     Procedures  /  Critical Care Procedures  ED Course and Medical Decision Making  I have reviewed the triage vital signs, the nursing notes, and pertinent available records from the EMR.  Social Determinants Affecting Complexity of Care: Patient has no clinically significant social determinants affecting this chief complaint..   ED Course: Clinical Course as of 04/24/23 0617  Sat Apr 24, 2023  0617 DG Chest 2 View No obvious abnormality [RB]    Clinical Course User Index [RB] Roxy Horseman, PA-C    Medical Decision Making Patient here with CP, SOB, and lightheadedness.  Symptoms awakened her from sleep.  No treatments PTA.  Will give ASA and SL nitroglycerin and morphine.  Will check labs and imaging.      Amount and/or Complexity of Data Reviewed Labs: ordered. Radiology: ordered and independent interpretation performed. Decision-making details documented in ED Course.  Risk OTC drugs. Prescription drug management.         Consultants:    Treatment and Plan: Patient signed out to oncoming team.  Plan: Follow-up on labs and repeat trop.  Reassess.  If trops are elevated or still having pain, plan for admission.  Final Clinical Impressions(s) / ED Diagnoses     ICD-10-CM   1. Chest pain, unspecified type  R07.9       ED Discharge Orders     None         Discharge Instructions Discussed with and Provided to Patient:   Discharge Instructions   None      Roxy Horseman, PA-C 04/24/23 0617    Tilden Fossa, MD 04/24/23 [phone]

## 2023-04-25 ENCOUNTER — Other Ambulatory Visit (HOSPITAL_COMMUNITY): Payer: Medicare HMO

## 2023-04-25 DIAGNOSIS — E119 Type 2 diabetes mellitus without complications: Secondary | ICD-10-CM | POA: Diagnosis present

## 2023-04-25 DIAGNOSIS — Z833 Family history of diabetes mellitus: Secondary | ICD-10-CM | POA: Diagnosis not present

## 2023-04-25 DIAGNOSIS — Z79899 Other long term (current) drug therapy: Secondary | ICD-10-CM | POA: Diagnosis not present

## 2023-04-25 DIAGNOSIS — Z7984 Long term (current) use of oral hypoglycemic drugs: Secondary | ICD-10-CM | POA: Diagnosis not present

## 2023-04-25 DIAGNOSIS — Z8249 Family history of ischemic heart disease and other diseases of the circulatory system: Secondary | ICD-10-CM | POA: Diagnosis not present

## 2023-04-25 DIAGNOSIS — G4700 Insomnia, unspecified: Secondary | ICD-10-CM | POA: Diagnosis present

## 2023-04-25 DIAGNOSIS — R002 Palpitations: Secondary | ICD-10-CM

## 2023-04-25 DIAGNOSIS — Z888 Allergy status to other drugs, medicaments and biological substances status: Secondary | ICD-10-CM | POA: Diagnosis not present

## 2023-04-25 DIAGNOSIS — M549 Dorsalgia, unspecified: Secondary | ICD-10-CM | POA: Diagnosis present

## 2023-04-25 DIAGNOSIS — R079 Chest pain, unspecified: Secondary | ICD-10-CM

## 2023-04-25 DIAGNOSIS — E1100 Type 2 diabetes mellitus with hyperosmolarity without nonketotic hyperglycemic-hyperosmolar coma (NKHHC): Secondary | ICD-10-CM

## 2023-04-25 DIAGNOSIS — R252 Cramp and spasm: Secondary | ICD-10-CM | POA: Diagnosis present

## 2023-04-25 DIAGNOSIS — Z6828 Body mass index (BMI) 28.0-28.9, adult: Secondary | ICD-10-CM | POA: Diagnosis not present

## 2023-04-25 DIAGNOSIS — Z7982 Long term (current) use of aspirin: Secondary | ICD-10-CM | POA: Diagnosis not present

## 2023-04-25 DIAGNOSIS — I25118 Atherosclerotic heart disease of native coronary artery with other forms of angina pectoris: Secondary | ICD-10-CM | POA: Diagnosis present

## 2023-04-25 DIAGNOSIS — M791 Myalgia, unspecified site: Secondary | ICD-10-CM | POA: Diagnosis present

## 2023-04-25 DIAGNOSIS — R072 Precordial pain: Secondary | ICD-10-CM | POA: Diagnosis not present

## 2023-04-25 DIAGNOSIS — M1990 Unspecified osteoarthritis, unspecified site: Secondary | ICD-10-CM | POA: Diagnosis present

## 2023-04-25 DIAGNOSIS — I200 Unstable angina: Secondary | ICD-10-CM | POA: Diagnosis not present

## 2023-04-25 DIAGNOSIS — E663 Overweight: Secondary | ICD-10-CM | POA: Diagnosis present

## 2023-04-25 DIAGNOSIS — E7800 Pure hypercholesterolemia, unspecified: Secondary | ICD-10-CM | POA: Diagnosis present

## 2023-04-25 DIAGNOSIS — I10 Essential (primary) hypertension: Secondary | ICD-10-CM | POA: Diagnosis present

## 2023-04-25 DIAGNOSIS — G8929 Other chronic pain: Secondary | ICD-10-CM | POA: Diagnosis present

## 2023-04-25 LAB — CBC
HCT: 43.6 % (ref 36.0–46.0)
Hemoglobin: 14.2 g/dL (ref 12.0–15.0)
MCH: 30 pg (ref 26.0–34.0)
MCHC: 32.6 g/dL (ref 30.0–36.0)
MCV: 92 fL (ref 80.0–100.0)
Platelets: 262 10*3/uL (ref 150–400)
RBC: 4.74 MIL/uL (ref 3.87–5.11)
RDW: 11.8 % (ref 11.5–15.5)
WBC: 6.5 10*3/uL (ref 4.0–10.5)
nRBC: 0 % (ref 0.0–0.2)

## 2023-04-25 LAB — HEPARIN LEVEL (UNFRACTIONATED)
Heparin Unfractionated: 0.37 [IU]/mL (ref 0.30–0.70)
Heparin Unfractionated: 0.26 [IU]/mL — ABNORMAL LOW (ref 0.30–0.70)
Heparin Unfractionated: 0.26 [IU]/mL — ABNORMAL LOW (ref 0.30–0.70)

## 2023-04-25 LAB — GLUCOSE, CAPILLARY
Glucose-Capillary: 142 mg/dL — ABNORMAL HIGH (ref 70–99)
Glucose-Capillary: 138 mg/dL — ABNORMAL HIGH (ref 70–99)
Glucose-Capillary: 186 mg/dL — ABNORMAL HIGH (ref 70–99)

## 2023-04-25 LAB — BASIC METABOLIC PANEL
Anion gap: 9 (ref 5–15)
BUN: 10 mg/dL (ref 6–20)
CO2: 25 mmol/L (ref 22–32)
Calcium: 9.4 mg/dL (ref 8.9–10.3)
Chloride: 102 mmol/L (ref 98–111)
Creatinine, Ser: 0.82 mg/dL (ref 0.44–1.00)
GFR, Estimated: >60 mL/min (ref >60)
Glucose, Bld: 125 mg/dL — ABNORMAL HIGH (ref 70–99)
Potassium: 4.5 mmol/L (ref 3.5–5.1)
Sodium: 136 mmol/L (ref 135–145)

## 2023-04-25 MED ORDER — INSULIN ASPART 100 UNIT/ML IJ SOLN: 0.0000 [IU] | Freq: Three times a day (TID) | INTRAMUSCULAR | Status: DC

## 2023-04-25 NOTE — H&P (View-Only) (Signed)
Progress Note  Patient Name: Jill Anderson Orders Date of Encounter: 04/25/2023  Primary Cardiologist: Reatha Harps, MD  Subjective   No acute events overnight, continues to have chest pressure 2/10 in intensity but was able to sleep through it.  Appears to be comfortable.  Inpatient Medications    Scheduled Meds:  aspirin  81 mg Oral Daily   ezetimibe  10 mg Oral QPM   nitroGLYCERIN  1 inch Topical Q6H   sodium chloride flush  3 mL Intravenous Q12H   Continuous Infusions:  heparin 650 Units/hr (04/25/23 0701)   PRN Meds: acetaminophen **OR** acetaminophen, albuterol, hydrALAZINE, morphine injection, nitroGLYCERIN, ondansetron **OR** ondansetron (ZOFRAN) IV   Vital Signs    Vitals:   04/24/23 2300 04/25/23 0447 04/25/23 0719 04/25/23 0802  BP: 122/69 130/73 (!) 145/90 (!) 144/73  Pulse: 60 72  65  Resp:   18 17  Temp: (!) 97.4 F (36.3 C) (!) 97.4 F (36.3 C) (!) 96.4 F (35.8 C) 97.7 F (36.5 C)  TempSrc: Oral Oral Oral Oral  SpO2: 97% 94% 98% 98%  Weight:      Height:        Intake/Output Summary (Last 24 hours) at 04/25/2023 0945 Last data filed at 04/25/2023 0701 Gross per 24 hour  Intake 150.12 ml  Output --  Net 150.12 ml   Filed Weights   04/24/23 0505  Weight: 70.8 kg    Telemetry     Personally reviewed, NSR.  ECG    Not performed today  Physical Exam   GEN: No acute distress.   Neck: No JVD. Cardiac: RRR, no murmur, rub, or gallop.  Respiratory: Nonlabored. Clear to auscultation bilaterally. GI: Soft, nontender, bowel sounds present. MS: No edema; No deformity. Neuro:  Nonfocal. Psych: Alert and oriented x 3. Normal affect.  Labs    Chemistry Recent Labs  Lab 04/24/23 0513 04/25/23 0705  NA 137 136  K 3.6 4.5  CL 105 102  CO2 22 25  GLUCOSE 156* 125*  BUN 10 10  CREATININE 0.83 0.82  CALCIUM 9.5 9.4  GFRNONAA >60 >60  ANIONGAP 10 9     Hematology Recent Labs  Lab 04/24/23 0513 04/25/23 0705  WBC 7.0 6.5  RBC  4.98 4.74  HGB 15.0 14.2  HCT 45.2 43.6  MCV 90.8 92.0  MCH 30.1 30.0  MCHC 33.2 32.6  RDW 11.9 11.8  PLT 305 262    Cardiac Enzymes Recent Labs  Lab 04/24/23 0513 04/24/23 0652  TROPONINIHS 4 3    BNPNo results for input(s): "BNP", "PROBNP" in the last 168 hours.   DDimer Recent Labs  Lab 04/24/23 0902  DDIMER <0.27     Radiology    VAS US CAROTID  Result Date: 04/24/2023 Carotid Arterial Duplex Study Patient Name:  CORRISA REPPOND  Date of Exam:   04/24/2023 Medical Rec #: [id number]     Accession #:    [id number] Date of Birth: [birth year]     Patient Gender: F Patient Age:   57 years Exam Location:  [facility] Procedure:      VAS US CAROTID Referring Phys: Randall An --------------------------------------------------------------------------------  Indications:       Dizziness. Risk Factors:      Hyperlipidemia, Diabetes, coronary artery disease. Other Factors:     Patient states she has history of Left ICA stenosis over 13                    years ago but  has not had a follow-up. Comparison Study:  No prior study on file Performing Technologist: Sherren Kerns RVS  Examination Guidelines: A complete evaluation includes B-mode imaging, spectral Doppler, color Doppler, and power Doppler as needed of all accessible portions of each vessel. Bilateral testing is considered an integral part of a complete examination. Limited examinations for reoccurring indications may be performed as noted.  Right Carotid Findings: +----------+--------+--------+--------+------------------+------------------+           PSV cm/sEDV cm/sStenosisPlaque DescriptionComments           +----------+--------+--------+--------+------------------+------------------+ CCA Prox  74      17                                intimal thickening +----------+--------+--------+--------+------------------+------------------+ CCA Distal79      26                                intimal thickening  +----------+--------+--------+--------+------------------+------------------+ ICA Prox  75      31              heterogenous                         +----------+--------+--------+--------+------------------+------------------+ ICA Mid   88      35                                                   +----------+--------+--------+--------+------------------+------------------+ ICA Distal106     43                                                   +----------+--------+--------+--------+------------------+------------------+ ECA       88      13                                                   +----------+--------+--------+--------+------------------+------------------+ +----------+--------+-------+--------+-------------------+           PSV cm/sEDV cmsDescribeArm Pressure (mmHG) +----------+--------+-------+--------+-------------------+ [id number]                                        +----------+--------+-------+--------+-------------------+ +---------+--------+--+--------+--+ VertebralPSV cm/s52EDV cm/s15 +---------+--------+--+--------+--+  Left Carotid Findings: +----------+--------+--------+--------+------------------+------------------+           PSV cm/sEDV cm/sStenosisPlaque DescriptionComments           +----------+--------+--------+--------+------------------+------------------+ CCA Prox  98      28                                intimal thickening +----------+--------+--------+--------+------------------+------------------+ CCA Distal85      28                                intimal thickening +----------+--------+--------+--------+------------------+------------------+ ICA Prox  106  29              heterogenous                         +----------+--------+--------+--------+------------------+------------------+ ICA Mid   78      24                                                    +----------+--------+--------+--------+------------------+------------------+ ICA Distal106     42                                                   +----------+--------+--------+--------+------------------+------------------+ ECA       85      17                                                   +----------+--------+--------+--------+------------------+------------------+ +----------+--------+--------+--------+-------------------+           PSV cm/sEDV cm/sDescribeArm Pressure (mmHG) +----------+--------+--------+--------+-------------------+ Subclavian162                                         +----------+--------+--------+--------+-------------------+ +---------+--------+--+--------+--+ VertebralPSV cm/s76EDV cm/s14 +---------+--------+--+--------+--+   Summary: Right Carotid: The extracranial vessels were near-normal with only minimal wall                thickening or plaque. Left Carotid: The extracranial vessels were near-normal with only minimal wall               thickening or plaque. Vertebrals:  Bilateral vertebral arteries demonstrate antegrade flow. Subclavians: Normal flow hemodynamics were seen in bilateral subclavian              arteries. *See table(s) above for measurements and observations.     Preliminary    DG Chest 2 View  Result Date: 04/24/2023 CLINICAL DATA:  57 year old female with chest pain. EXAM: CHEST - 2 VIEW COMPARISON:  CT chest 06/13/2021 and earlier. FINDINGS: PA and lateral views 0513 hours. Lung volumes are stable and within normal limits. Visualized tracheal air column is within normal limits. Lung markings appear stable since 2016, within normal limits. Both lungs appear clear. No pneumothorax or pleural effusion. Negative visible bowel gas and osseous structures. IMPRESSION: Negative.  No acute cardiopulmonary abnormality. Electronically Signed   By: Odessa Fleming M.D.   On: 04/24/2023 05:37      Assessment & Plan   Unstable angina DM  2 Family history of premature CAD   -Presented with chest pressure waking her up from the sleep and since then has had waxing and waning of chest pressure radiating to her left neck, left shoulder, left arm associated with tingling in both hands. Chest pressure partially improved from 7/10 to 5/10 with SL NTG.  Has active chest pressure, 2/10 in intensity.  She has pleuritic chest pain as well, mixed features.  EKG showed nonspecific T wave changes in the inferior leads but otherwise no significant ST changes, NSR.  Troponins  within normal limits.  ESR, CRP, D-dimer normal.  Due to cardiac risk factors of DM 2 and family history of possible premature CAD, will continue ACS protocol and scheduled for [facility] [place] tomorrow.  Keep n.p.o. from midnight.   Informed consent for [facility] Risks and benefits of cardiac catheterization have been discussed with the patient.  These include bleeding, infection, kidney damage, stroke, heart attack, death.  The patient understands these risks and is willing to proceed.    Signed, Marjo Bicker, MD  04/25/2023, 9:45 AM

## 2023-04-25 NOTE — Progress Notes (Signed)
Patient blood sugar 142 requiring 1 unit - pt refused as well as she refused her nitro paste. RN notified MD

## 2023-04-25 NOTE — Progress Notes (Signed)
Mobility Specialist Progress Note:   04/25/23 1400  Mobility  Activity Ambulated with assistance in hallway  Level of Assistance Modified independent, requires aide device or extra time  Assistive Device Other (Comment) (IV Pole)  Distance Ambulated (ft) 600 ft  Activity Response Tolerated well  Mobility Referral Yes  $Mobility charge 1 Mobility  Mobility Specialist Start Time (ACUTE ONLY) 1417  Mobility Specialist Stop Time (ACUTE ONLY) 1441  Mobility Specialist Time Calculation (min) (ACUTE ONLY) 24 min    Pre Mobility: 69 HR During Mobility: 88 HR Post Mobility:  84 HR  Pt received in bed, agreeable to mobility. Asymptomatic throughout w/ no complaints. Returned to room w/o fault. Pt left in bed with call bell and all needs met.   [place]ante Earlene Plater Mobility Specialist Please contact via Special educational needs teacher or Rehab office at [phone]

## 2023-04-25 NOTE — Plan of Care (Signed)
  Problem: Education: Goal: Knowledge of General Education information will improve Description: Including pain rating scale, medication(s)/side effects and non-pharmacologic comfort measures Outcome: Progressing   Problem: Health Behavior/Discharge Planning: Goal: Ability to manage health-related needs will improve Outcome: Progressing   Problem: Clinical Measurements: Goal: Will remain free from infection Outcome: Progressing Goal: Respiratory complications will improve Outcome: Progressing Goal: Cardiovascular complication will be avoided Outcome: Progressing   Problem: Activity: Goal: Risk for activity intolerance will decrease Outcome: Progressing   Problem: Nutrition: Goal: Adequate nutrition will be maintained Outcome: Progressing   Problem: Coping: Goal: Level of anxiety will decrease Outcome: Progressing   Problem: Elimination: Goal: Will not experience complications related to bowel motility Outcome: Progressing Goal: Will not experience complications related to urinary retention Outcome: Progressing   Problem: Pain Management: Goal: General experience of comfort will improve Outcome: Progressing   Problem: Safety: Goal: Ability to remain free from injury will improve Outcome: Progressing

## 2023-04-25 NOTE — Progress Notes (Signed)
PHARMACY - ANTICOAGULATION CONSULT NOTE  Pharmacy Consult for heparin  Indication: chest pain/ACS  Allergies  Allergen Reactions   Lipitor [Atorvastatin] Other (See Comments)    Myalgia    Glucotrol [Glipizide] Other (See Comments)    Burning in stomach    Patient Measurements: Height: 5\' 2"  (157.5 cm) Weight: 70.8 kg (156 lb) IBW/kg (Calculated) : 50.1 Heparin Dosing Weight: 65 kg   Vital Signs: Temp: 98.2 F (36.8 C) (11/24 1943) Temp Source: Oral (11/24 1943) BP: 120/70 (11/24 1943) Pulse Rate: 81 (11/24 1943)  Labs: Recent Labs    04/24/23 0513 04/24/23 0652 04/24/23 2130 04/25/23 0705 04/25/23 1237 04/25/23 2014  HGB 15.0  --   --  14.2  --   --   HCT 45.2  --   --  43.6  --   --   PLT 305  --   --  262  --   --   HEPARINUNFRC  --   --    < > 0.37 0.26* 0.26*  CREATININE 0.83  --   --  0.82  --   --   TROPONINIHS 4 3  --   --   --   --    < > = values in this interval not displayed.    Estimated Creatinine Clearance: 68.9 mL/min (by C-G formula based on SCr of 0.82 mg/dL).   Medical History: Past Medical History:  Diagnosis Date   Borderline diabetic    Chronic back pain    Diabetes mellitus without complication (HCC)    Hypercholesteremia     Assessment: 89 YOF presenting to [facility] ED with chest pain. Pharmacy consulted to dose heparin d/t concern for ACS/STEMI. No anticoagulants PTA.   - Heparin level remains subtherapeutic at 0.26 on 750 units/hr.    Goal of Therapy:  Heparin level 0.3-0.7 units/ml Monitor platelets by anticoagulation protocol: Yes   Plan:  Increase heparin heparin to 900 units/hr Daily heparin level, CBC   Harland German, PharmD Clinical Pharmacist **Pharmacist phone directory can now be found on [email or web address] ([email or web address]).  Listed under [facility].

## 2023-04-25 NOTE — Progress Notes (Signed)
[facility]                                                                              Jill Anderson, is a 57 y.o. female, DOB - 24-Sep-1965, ZOX:[id number] Admit date - 04/24/2023    Outpatient Primary MD for the patient is Rudd, Bertram Millard, MD  LOS - 0  days  Chief Complaint  Patient presents with   Chest Pain       Brief summary   Patient is a 57 year old female with HTN, dyslipidemia, diabetes mellitus type 2, family history of coronary artery disease presented with sharp chest pressure that started a night before around 2 AM.  The symptoms woke the patient from sleep.  Patient reported experiencing minor chest pains for the 2 days with lightheadedness, pressure of the head, pain radiating down to the arm and tingling in the fingers.  No nausea vomiting or diaphoresis.  Also reported palpitations and history of leg cramps. In ED, vital signs stable, BP 159/90.  Troponins x 2 negative.  EKG without significant ischemic changes. Cardiology was consulted, started on IV heparin drip Admitted for further workup.  Assessment & Plan    Principal Problem:   Chest pain/unstable angina -Risk factors include hypertension, hyperlipidemia, diabetes mellitus, strong family history of CAD -Chest x-ray negative -Troponins negative, no acute ischemic changes on the EKG.  D-dimer <0.27.  ESR, CRP normal. -Cardiology consulted, started on IV heparin drip with plan for left heart cath on Monday 11/25 -Chest pain now improving, continue aspirin, Zetia, IV heparin drip -2D echo pending -carotid duplex showed near normal with only minimal wall plaque bilateral renal arteries  Active Problems:    Palpitations -Cardiology following, will follow recommendations,?  Holter monitor     Type 2 diabetes mellitus (HCC), NIDDM -Hemoglobin A1c 6.7 on 03/19/2023, not on any oral hypoglycemics -Continue sliding scale insulin while inpatient -Placed on carb modified diet      Hyperlipidemia -Prior inability to tolerate atorvastatin due to myalgias -Follow lipid panel, continue Zetia   Overweight  Overweight Estimated body mass index is 28.53 kg/m as calculated from the following:   Height as of this encounter: 5\' 2"  (1.575 m).   Weight as of this encounter: 70.8 kg.  Code Status: Full code DVT Prophylaxis:  Heparin drip   Level of Care: Level of care: Telemetry Cardiac Family Communication: Updated patient Disposition Plan:      Remains inpatient appropriate: Plan for cardiac cath tomorrow 11/25   Procedures:  Carotid Dopplers  Consultants:   Cardiology  Antimicrobials:   Anti-infectives (From admission, onward)    None          Medications  aspirin  81 mg Oral Daily   ezetimibe  10 mg Oral QPM   nitroGLYCERIN  1 inch Topical Q6H   sodium chloride flush  3 mL Intravenous Q12H      Subjective:   Jill Anderson was seen and examined today.  Chest pain improving, 2/10.  No acute nausea vomiting, shortness of breath.  Tolerating breakfast without difficulty.  No acute events overnight.    Objective:   Vitals:   04/24/23 2300  04/25/23 0447 04/25/23 0719 04/25/23 0802  BP: 122/69 130/73 (!) 145/90 (!) 144/73  Pulse: 60 72  65  Resp:   18 17  Temp: (!) 97.4 F (36.3 C) (!) 97.4 F (36.3 C) (!) 96.4 F (35.8 C) 97.7 F (36.5 C)  TempSrc: Oral Oral Oral Oral  SpO2: 97% 94% 98% 98%  Weight:      Height:        Intake/Output Summary (Last 24 hours) at 04/25/2023 0943 Last data filed at 04/25/2023 0701 Gross per 24 hour  Intake 150.12 ml  Output --  Net 150.12 ml     Wt Readings from Last 3 Encounters:  04/24/23 70.8 kg  03/19/23 71.4 kg  11/02/22 69 kg     Exam General: Alert and oriented x 3, NAD Cardiovascular: S1 S2 auscultated,  RRR Respiratory: Clear to auscultation bilaterally, no wheezing Gastrointestinal: Soft, nontender, nondistended, + bowel sounds Ext: no pedal edema bilaterally Neuro: no new  deficits Psych: Normal affect     Data Reviewed:  I have personally reviewed following labs    CBC Lab Results  Component Value Date   WBC 6.5 04/25/2023   RBC 4.74 04/25/2023   HGB 14.2 04/25/2023   HCT 43.6 04/25/2023   MCV 92.0 04/25/2023   MCH 30.0 04/25/2023   PLT 262 04/25/2023   MCHC 32.6 04/25/2023   RDW 11.8 04/25/2023   LYMPHSABS 0.8 10/09/2021   MONOABS 0.5 10/09/2021   EOSABS 0.2 10/09/2021   BASOSABS 0.0 10/09/2021     Last metabolic panel Lab Results  Component Value Date   NA 136 04/25/2023   K 4.5 04/25/2023   CL 102 04/25/2023   CO2 25 04/25/2023   BUN 10 04/25/2023   CREATININE 0.82 04/25/2023   GLUCOSE 125 (H) 04/25/2023   GFRNONAA >60 04/25/2023   GFRAA 101 12/19/2019   CALCIUM 9.4 04/25/2023   PROT 7.4 02/05/2022   ALBUMIN 4.1 02/05/2022   LABGLOB 2.8 02/12/2021   AGRATIO 1.6 02/12/2021   BILITOT 1.1 02/05/2022   ALKPHOS 48 02/05/2022   AST 35 02/05/2022   ALT 50 (H) 02/05/2022   ANIONGAP 9 04/25/2023    CBG (last 3)  No results for input(s): "GLUCAP" in the last 72 hours.    Coagulation Profile: No results for input(s): "INR", "PROTIME" in the last 168 hours.   Radiology Studies: I have personally reviewed the imaging studies  VAS US CAROTID  Result Date: 04/24/2023 Carotid Arterial Duplex Study Patient Name:  Jill Anderson  Date of Exam:   04/24/2023 Medical Rec #: [id number]     Accession #:    [id number] Date of Birth: [birth year]     Patient Gender: F Patient Age:   11 years Exam Location:  [facility] Procedure:      VAS US CAROTID Referring Phys: Randall An --------------------------------------------------------------------------------  Indications:       Dizziness. Risk Factors:      Hyperlipidemia, Diabetes, coronary artery disease. Other Factors:     Patient states she has history of Left ICA stenosis over 13                    years ago but has not had a follow-up. Comparison Study:  No prior study on file  Performing Technologist: Sherren Kerns RVS  Examination Guidelines: A complete evaluation includes B-mode imaging, spectral Doppler, color Doppler, and power Doppler as needed of all accessible portions of each vessel. Bilateral testing is considered an integral part of a  complete examination. Limited examinations for reoccurring indications may be performed as noted.  Right Carotid Findings: +----------+--------+--------+--------+------------------+------------------+           PSV cm/sEDV cm/sStenosisPlaque DescriptionComments           +----------+--------+--------+--------+------------------+------------------+ CCA Prox  74      17                                intimal thickening +----------+--------+--------+--------+------------------+------------------+ CCA Distal79      26                                intimal thickening +----------+--------+--------+--------+------------------+------------------+ ICA Prox  75      31              heterogenous                         +----------+--------+--------+--------+------------------+------------------+ ICA Mid   88      35                                                   +----------+--------+--------+--------+------------------+------------------+ ICA Distal106     43                                                   +----------+--------+--------+--------+------------------+------------------+ ECA       88      13                                                   +----------+--------+--------+--------+------------------+------------------+ +----------+--------+-------+--------+-------------------+           PSV cm/sEDV cmsDescribeArm Pressure (mmHG) +----------+--------+-------+--------+-------------------+ [id number]                                        +----------+--------+-------+--------+-------------------+ +---------+--------+--+--------+--+ VertebralPSV cm/s52EDV cm/s15  +---------+--------+--+--------+--+  Left Carotid Findings: +----------+--------+--------+--------+------------------+------------------+           PSV cm/sEDV cm/sStenosisPlaque DescriptionComments           +----------+--------+--------+--------+------------------+------------------+ CCA Prox  98      28                                intimal thickening +----------+--------+--------+--------+------------------+------------------+ CCA Distal85      28                                intimal thickening +----------+--------+--------+--------+------------------+------------------+ ICA Prox  106     29              heterogenous                         +----------+--------+--------+--------+------------------+------------------+ ICA Mid   78  24                                                   +----------+--------+--------+--------+------------------+------------------+ ICA Distal106     42                                                   +----------+--------+--------+--------+------------------+------------------+ ECA       85      17                                                   +----------+--------+--------+--------+------------------+------------------+ +----------+--------+--------+--------+-------------------+           PSV cm/sEDV cm/sDescribeArm Pressure (mmHG) +----------+--------+--------+--------+-------------------+ Subclavian162                                         +----------+--------+--------+--------+-------------------+ +---------+--------+--+--------+--+ VertebralPSV cm/s76EDV cm/s14 +---------+--------+--+--------+--+   Summary: Right Carotid: The extracranial vessels were near-normal with only minimal wall                thickening or plaque. Left Carotid: The extracranial vessels were near-normal with only minimal wall               thickening or plaque. Vertebrals:  Bilateral vertebral arteries demonstrate antegrade  flow. Subclavians: Normal flow hemodynamics were seen in bilateral subclavian              arteries. *See table(s) above for measurements and observations.     Preliminary    DG Chest 2 View  Result Date: 04/24/2023 CLINICAL DATA:  57 year old female with chest pain. EXAM: CHEST - 2 VIEW COMPARISON:  CT chest 06/13/2021 and earlier. FINDINGS: PA and lateral views 0513 hours. Lung volumes are stable and within normal limits. Visualized tracheal air column is within normal limits. Lung markings appear stable since 2016, within normal limits. Both lungs appear clear. No pneumothorax or pleural effusion. Negative visible bowel gas and osseous structures. IMPRESSION: Negative.  No acute cardiopulmonary abnormality. Electronically Signed   By: Odessa Fleming M.D.   On: 04/24/2023 05:37       Berdene Askari M.D. [facility] 04/25/2023, 9:43 AM  Available via Epic secure chat 7am-7pm After 7 pm, please refer to night coverage provider listed on amion.

## 2023-04-25 NOTE — Progress Notes (Signed)
PHARMACY - ANTICOAGULATION CONSULT NOTE  Pharmacy Consult for heparin  Indication: chest pain/ACS  Allergies  Allergen Reactions   Lipitor [Atorvastatin] Other (See Comments)    Myalgia    Glucotrol [Glipizide] Other (See Comments)    Burning in stomach    Patient Measurements: Height: 5\' 2"  (157.5 cm) Weight: 70.8 kg (156 lb) IBW/kg (Calculated) : 50.1 Heparin Dosing Weight: 65 kg   Vital Signs: Temp: 97.9 F (36.6 C) (11/24 1118) Temp Source: Oral (11/24 1118) BP: 110/64 (11/24 1118) Pulse Rate: 65 (11/24 0802)  Labs: Recent Labs    04/24/23 0513 04/24/23 0652 04/24/23 2130 04/25/23 0705 04/25/23 1237  HGB 15.0  --   --  14.2  --   HCT 45.2  --   --  43.6  --   PLT 305  --   --  262  --   HEPARINUNFRC  --   --  0.89* 0.37 0.26*  CREATININE 0.83  --   --  0.82  --   TROPONINIHS 4 3  --   --   --     Estimated Creatinine Clearance: 68.9 mL/min (by C-G formula based on SCr of 0.82 mg/dL).   Medical History: Past Medical History:  Diagnosis Date   Borderline diabetic    Chronic back pain    Diabetes mellitus without complication (HCC)    Hypercholesteremia     Assessment: 51 YOF presenting to [facility] ED with chest pain. Pharmacy consulted to dose heparin d/t concern for ACS/STEMI. No anticoagulants PTA.   11/24 confirmatory Heparin level subtherapeutic at 0.26 on 650 units/hr.  CBC stable (Hgb 14.2, pltc 262).  No issues noted.  Goal of Therapy:  Heparin level 0.3-0.7 units/ml Monitor platelets by anticoagulation protocol: Yes   Plan:  Increase heparin heparin to 750 units/hr Daily heparin level, CBC  F/U 6h heparin level F/u intervention plans - tentative [facility] Monday  Jani Gravel, PharmD Clinical Pharmacist  04/25/2023 1:34 PM

## 2023-04-25 NOTE — Progress Notes (Signed)
Progress Note  Patient Name: Jill Anderson Date of Encounter: 04/25/2023  Primary Cardiologist: Reatha Harps, MD  Subjective   No acute events overnight, continues to have chest pressure 2/10 in intensity but was able to sleep through it.  Appears to be comfortable.  Inpatient Medications    Scheduled Meds:  aspirin  81 mg Oral Daily   ezetimibe  10 mg Oral QPM   nitroGLYCERIN  1 inch Topical Q6H   sodium chloride flush  3 mL Intravenous Q12H   Continuous Infusions:  heparin 650 Units/hr (04/25/23 0701)   PRN Meds: acetaminophen **OR** acetaminophen, albuterol, hydrALAZINE, morphine injection, nitroGLYCERIN, ondansetron **OR** ondansetron (ZOFRAN) IV   Vital Signs    Vitals:   04/24/23 2300 04/25/23 0447 04/25/23 0719 04/25/23 0802  BP: 122/69 130/73 (!) 145/90 (!) 144/73  Pulse: 60 72  65  Resp:   18 17  Temp: (!) 97.4 F (36.3 C) (!) 97.4 F (36.3 C) (!) 96.4 F (35.8 C) 97.7 F (36.5 C)  TempSrc: Oral Oral Oral Oral  SpO2: 97% 94% 98% 98%  Weight:      Height:        Intake/Output Summary (Last 24 hours) at 04/25/2023 0945 Last data filed at 04/25/2023 0701 Gross per 24 hour  Intake 150.12 ml  Output --  Net 150.12 ml   Filed Weights   04/24/23 0505  Weight: 70.8 kg    Telemetry     Personally reviewed, NSR.  ECG    Not performed today  Physical Exam   GEN: No acute distress.   Neck: No JVD. Cardiac: RRR, no murmur, rub, or gallop.  Respiratory: Nonlabored. Clear to auscultation bilaterally. GI: Soft, nontender, bowel sounds present. MS: No edema; No deformity. Neuro:  Nonfocal. Psych: Alert and oriented x 3. Normal affect.  Labs    Chemistry Recent Labs  Lab 04/24/23 0513 04/25/23 0705  NA 137 136  K 3.6 4.5  CL 105 102  CO2 22 25  GLUCOSE 156* 125*  BUN 10 10  CREATININE 0.83 0.82  CALCIUM 9.5 9.4  GFRNONAA >60 >60  ANIONGAP 10 9     Hematology Recent Labs  Lab 04/24/23 0513 04/25/23 0705  WBC 7.0 6.5  RBC  4.98 4.74  HGB 15.0 14.2  HCT 45.2 43.6  MCV 90.8 92.0  MCH 30.1 30.0  MCHC 33.2 32.6  RDW 11.9 11.8  PLT 305 262    Cardiac Enzymes Recent Labs  Lab 04/24/23 0513 04/24/23 0652  TROPONINIHS 4 3    BNPNo results for input(s): "BNP", "PROBNP" in the last 168 hours.   DDimer Recent Labs  Lab 04/24/23 0902  DDIMER <0.27     Radiology    VAS US CAROTID  Result Date: 04/24/2023 Carotid Arterial Duplex Study Patient Name:  Jill Anderson  Date of Exam:   04/24/2023 Medical Rec #: [id number]     Accession #:    [id number] Date of Birth: July 12, 1965     Patient Gender: F Patient Age:   57 years Exam Location:  [facility] Procedure:      VAS US CAROTID Referring Phys: Randall An --------------------------------------------------------------------------------  Indications:       Dizziness. Risk Factors:      Hyperlipidemia, Diabetes, coronary artery disease. Other Factors:     Patient states she has history of Left ICA stenosis over 13                    years ago but  has not had a follow-up. Comparison Study:  No prior study on file Performing Technologist: Sherren Kerns RVS  Examination Guidelines: A complete evaluation includes B-mode imaging, spectral Doppler, color Doppler, and power Doppler as needed of all accessible portions of each vessel. Bilateral testing is considered an integral part of a complete examination. Limited examinations for reoccurring indications may be performed as noted.  Right Carotid Findings: +----------+--------+--------+--------+------------------+------------------+           PSV cm/sEDV cm/sStenosisPlaque DescriptionComments           +----------+--------+--------+--------+------------------+------------------+ CCA Prox  74      17                                intimal thickening +----------+--------+--------+--------+------------------+------------------+ CCA Distal79      26                                intimal thickening  +----------+--------+--------+--------+------------------+------------------+ ICA Prox  75      31              heterogenous                         +----------+--------+--------+--------+------------------+------------------+ ICA Mid   88      35                                                   +----------+--------+--------+--------+------------------+------------------+ ICA Distal106     43                                                   +----------+--------+--------+--------+------------------+------------------+ ECA       88      13                                                   +----------+--------+--------+--------+------------------+------------------+ +----------+--------+-------+--------+-------------------+           PSV cm/sEDV cmsDescribeArm Pressure (mmHG) +----------+--------+-------+--------+-------------------+ [id number]                                        +----------+--------+-------+--------+-------------------+ +---------+--------+--+--------+--+ VertebralPSV cm/s52EDV cm/s15 +---------+--------+--+--------+--+  Left Carotid Findings: +----------+--------+--------+--------+------------------+------------------+           PSV cm/sEDV cm/sStenosisPlaque DescriptionComments           +----------+--------+--------+--------+------------------+------------------+ CCA Prox  98      28                                intimal thickening +----------+--------+--------+--------+------------------+------------------+ CCA Distal85      28                                intimal thickening +----------+--------+--------+--------+------------------+------------------+ ICA Prox  106  29              heterogenous                         +----------+--------+--------+--------+------------------+------------------+ ICA Mid   78      24                                                    +----------+--------+--------+--------+------------------+------------------+ ICA Distal106     42                                                   +----------+--------+--------+--------+------------------+------------------+ ECA       85      17                                                   +----------+--------+--------+--------+------------------+------------------+ +----------+--------+--------+--------+-------------------+           PSV cm/sEDV cm/sDescribeArm Pressure (mmHG) +----------+--------+--------+--------+-------------------+ Subclavian162                                         +----------+--------+--------+--------+-------------------+ +---------+--------+--+--------+--+ VertebralPSV cm/s76EDV cm/s14 +---------+--------+--+--------+--+   Summary: Right Carotid: The extracranial vessels were near-normal with only minimal wall                thickening or plaque. Left Carotid: The extracranial vessels were near-normal with only minimal wall               thickening or plaque. Vertebrals:  Bilateral vertebral arteries demonstrate antegrade flow. Subclavians: Normal flow hemodynamics were seen in bilateral subclavian              arteries. *See table(s) above for measurements and observations.     Preliminary    DG Chest 2 View  Result Date: 04/24/2023 CLINICAL DATA:  57 year old female with chest pain. EXAM: CHEST - 2 VIEW COMPARISON:  CT chest 06/13/2021 and earlier. FINDINGS: PA and lateral views 0513 hours. Lung volumes are stable and within normal limits. Visualized tracheal air column is within normal limits. Lung markings appear stable since 2016, within normal limits. Both lungs appear clear. No pneumothorax or pleural effusion. Negative visible bowel gas and osseous structures. IMPRESSION: Negative.  No acute cardiopulmonary abnormality. Electronically Signed   By: Odessa Fleming M.D.   On: 04/24/2023 05:37      Assessment & Plan   Unstable angina DM  2 Family history of premature CAD   -Presented with chest pressure waking her up from the sleep and since then has had waxing and waning of chest pressure radiating to her left neck, left shoulder, left arm associated with tingling in both hands. Chest pressure partially improved from 7/10 to 5/10 with SL NTG.  Has active chest pressure, 2/10 in intensity.  She has pleuritic chest pain as well, mixed features.  EKG showed nonspecific T wave changes in the inferior leads but otherwise no significant ST changes, NSR.  Troponins  within normal limits.  ESR, CRP, D-dimer normal.  Due to cardiac risk factors of DM 2 and family history of possible premature CAD, will continue ACS protocol and scheduled for [facility] tomorrow.  Keep n.p.o. from midnight.   Informed consent for [facility] Risks and benefits of cardiac catheterization have been discussed with the patient.  These include bleeding, infection, kidney damage, stroke, heart attack, death.  The patient understands these risks and is willing to proceed.    Signed, Marjo Bicker, MD  04/25/2023, 9:45 AM

## 2023-04-25 NOTE — Progress Notes (Signed)
PHARMACY - ANTICOAGULATION CONSULT NOTE  Pharmacy Consult for heparin  Indication: chest pain/ACS  Allergies  Allergen Reactions   Lipitor [Atorvastatin] Other (See Comments)    Myalgia    Glucotrol [Glipizide] Other (See Comments)    Burning in stomach    Patient Measurements: Height: 5\' 2"  (157.5 cm) Weight: 70.8 kg (156 lb) IBW/kg (Calculated) : 50.1 Heparin Dosing Weight: 65 kg   Vital Signs: Temp: 97.7 F (36.5 C) (11/24 0802) Temp Source: Oral (11/24 0802) BP: 144/73 (11/24 0802) Pulse Rate: 65 (11/24 0802)  Labs: Recent Labs    04/24/23 0513 04/24/23 0652 04/24/23 2130 04/25/23 0705  HGB 15.0  --   --  14.2  HCT 45.2  --   --  43.6  PLT 305  --   --  262  HEPARINUNFRC  --   --  0.89* 0.37  CREATININE 0.83  --   --  0.82  TROPONINIHS 4 3  --   --     Estimated Creatinine Clearance: 68.9 mL/min (by C-G formula based on SCr of 0.82 mg/dL).   Medical History: Past Medical History:  Diagnosis Date   Borderline diabetic    Chronic back pain    Diabetes mellitus without complication (HCC)    Hypercholesteremia     Assessment: 78 YOF presenting to [facility] ED with chest pain. Pharmacy consulted to dose heparin d/t concern for ACS/STEMI. No anticoagulants PTA.   Heparin level 0.37 on 650 units/hr.  CBC stable (Hgb 14.2, pltc 262).  No issues noted.  Goal of Therapy:  Heparin level 0.3-0.7 units/ml Monitor platelets by anticoagulation protocol: Yes   Plan:  Continue heparin at 650 units/hr Daily heparin level, CBC  F/u intervention plans - tentative [facility] Monday  Trixie Rude, PharmD Clinical Pharmacist 04/25/2023  8:08 AM

## 2023-04-25 NOTE — Care Management Obs Status (Signed)
MEDICARE OBSERVATION STATUS NOTIFICATION   Patient Details  Name: Jill Anderson MRN: [id number] Date of Birth: 1966/04/22   Medicare Observation Status Notification Given:  Yes    Ronny Bacon, RN 04/25/2023, 6:53 AM

## 2023-04-26 ENCOUNTER — Encounter (HOSPITAL_COMMUNITY): Payer: Self-pay | Admitting: Cardiovascular Disease

## 2023-04-26 ENCOUNTER — Inpatient Hospital Stay (HOSPITAL_COMMUNITY): Payer: Medicare HMO

## 2023-04-26 ENCOUNTER — Telehealth: Payer: Self-pay | Admitting: Family Medicine

## 2023-04-26 ENCOUNTER — Encounter (HOSPITAL_COMMUNITY)
Admission: EM | Disposition: A | Discharge status: Home / Self Care | Payer: Self-pay | Source: Home / Self Care | Attending: Internal Medicine

## 2023-04-26 DIAGNOSIS — Z8249 Family history of ischemic heart disease and other diseases of the circulatory system: Secondary | ICD-10-CM | POA: Diagnosis not present

## 2023-04-26 DIAGNOSIS — R079 Chest pain, unspecified: Secondary | ICD-10-CM | POA: Diagnosis not present

## 2023-04-26 DIAGNOSIS — R072 Precordial pain: Secondary | ICD-10-CM

## 2023-04-26 DIAGNOSIS — E1100 Type 2 diabetes mellitus with hyperosmolarity without nonketotic hyperglycemic-hyperosmolar coma (NKHHC): Secondary | ICD-10-CM | POA: Diagnosis not present

## 2023-04-26 HISTORY — PX: LEFT HEART CATH AND CORONARY ANGIOGRAPHY: CATH118249

## 2023-04-26 LAB — GLUCOSE, CAPILLARY
Glucose-Capillary: 133 mg/dL — ABNORMAL HIGH (ref 70–99)
Glucose-Capillary: 184 mg/dL — ABNORMAL HIGH (ref 70–99)

## 2023-04-26 LAB — ECHOCARDIOGRAM COMPLETE
AR max vel: 2.3 cm2
AV Area VTI: 2.42 cm2
AV Area mean vel: 2.3 cm2
AV Mean grad: 2 mm[Hg]
AV Peak grad: 4.6 mm[Hg]
Ao pk vel: 1.07 m/s
Area-P 1/2: 3.42 cm2
Height: 62 in
S' Lateral: 2.5 cm
Weight: 2490.32 [oz_av]

## 2023-04-26 LAB — CBC
HCT: 42.7 % (ref 36.0–46.0)
Hemoglobin: 14.1 g/dL (ref 12.0–15.0)
MCH: 30.3 pg (ref 26.0–34.0)
MCHC: 33 g/dL (ref 30.0–36.0)
MCV: 91.6 fL (ref 80.0–100.0)
Platelets: 273 10*3/uL (ref 150–400)
RBC: 4.66 MIL/uL (ref 3.87–5.11)
RDW: 11.8 % (ref 11.5–15.5)
WBC: 7.2 10*3/uL (ref 4.0–10.5)
nRBC: 0 % (ref 0.0–0.2)

## 2023-04-26 LAB — LIPID PANEL
Cholesterol: 192 mg/dL (ref 0–200)
HDL: 51 mg/dL (ref >40)
LDL Cholesterol: 106 mg/dL — ABNORMAL HIGH (ref 0–99)
Total CHOL/HDL Ratio: 3.8 {ratio}
Triglycerides: 175 mg/dL — ABNORMAL HIGH (ref <150)
VLDL: 35 mg/dL (ref 0–40)

## 2023-04-26 LAB — BASIC METABOLIC PANEL
Anion gap: 6 (ref 5–15)
BUN: 12 mg/dL (ref 6–20)
CO2: 24 mmol/L (ref 22–32)
Calcium: 9.2 mg/dL (ref 8.9–10.3)
Chloride: 104 mmol/L (ref 98–111)
Creatinine, Ser: 0.76 mg/dL (ref 0.44–1.00)
GFR, Estimated: >60 mL/min (ref >60)
Glucose, Bld: 141 mg/dL — ABNORMAL HIGH (ref 70–99)
Potassium: 4 mmol/L (ref 3.5–5.1)
Sodium: 134 mmol/L — ABNORMAL LOW (ref 135–145)

## 2023-04-26 LAB — HEPARIN LEVEL (UNFRACTIONATED): Heparin Unfractionated: 0.42 [IU]/mL (ref 0.30–0.70)

## 2023-04-26 SURGERY — LEFT HEART CATH AND CORONARY ANGIOGRAPHY
Anesthesia: LOCAL

## 2023-04-26 MED ORDER — VERAPAMIL HCL 2.5 MG/ML IV SOLN
INTRAVENOUS | Status: AC
  Filled 2023-04-26: qty 2

## 2023-04-26 MED ORDER — SODIUM CHLORIDE 0.9% FLUSH: 3.0000 mL | INTRAVENOUS | Status: DC | PRN

## 2023-04-26 MED ORDER — SODIUM CHLORIDE 0.9 % IV SOLN: 250.0000 mL | INTRAVENOUS | Status: DC | PRN

## 2023-04-26 MED ORDER — IOHEXOL 350 MG/ML SOLN
INTRAVENOUS | Status: DC | PRN
  Administered 2023-04-26: 30 mL via INTRA_ARTERIAL

## 2023-04-26 MED ORDER — PANTOPRAZOLE SODIUM 40 MG PO TBEC: DELAYED_RELEASE_TABLET | ORAL | 3 refills | Status: AC

## 2023-04-26 MED ORDER — BLOOD GLUCOSE TEST VI STRP: 1.0000 | ORAL_STRIP | Freq: Three times a day (TID) | 2 refills | Status: AC

## 2023-04-26 MED ORDER — HEPARIN (PORCINE) IN NACL 1000-0.9 UT/500ML-% IV SOLN
INTRAVENOUS | Status: DC | PRN
  Administered 2023-04-26: 1000 mL via INTRA_ARTERIAL

## 2023-04-26 MED ORDER — HYDRALAZINE HCL 20 MG/ML IJ SOLN: 10.0000 mg | INTRAMUSCULAR | Status: AC | PRN

## 2023-04-26 MED ORDER — SODIUM CHLORIDE 0.9% FLUSH
3.0000 mL | Freq: Two times a day (BID) | INTRAVENOUS | Status: DC
  Administered 2023-04-26: 3 mL via INTRAVENOUS

## 2023-04-26 MED ORDER — SODIUM CHLORIDE 0.9 % IV SOLN: INTRAVENOUS | Status: AC

## 2023-04-26 MED ORDER — LIDOCAINE HCL (PF) 1 % IJ SOLN
INTRAMUSCULAR | Status: DC | PRN
  Administered 2023-04-26: 5 mL

## 2023-04-26 MED ORDER — VERAPAMIL HCL 2.5 MG/ML IV SOLN
INTRAVENOUS | Status: DC | PRN
  Administered 2023-04-26: 10 mL via INTRA_ARTERIAL

## 2023-04-26 MED ORDER — FENTANYL CITRATE (PF) 100 MCG/2ML IJ SOLN
INTRAMUSCULAR | Status: DC | PRN
  Administered 2023-04-26: 50 ug via INTRAVENOUS

## 2023-04-26 MED ORDER — ONDANSETRON HCL 4 MG PO TABS: 4.0000 mg | ORAL_TABLET | Freq: Three times a day (TID) | ORAL | 0 refills | Status: AC | PRN

## 2023-04-26 MED ORDER — BLOOD GLUCOSE MONITORING SUPPL DEVI: 1.0000 | Freq: Three times a day (TID) | 0 refills | Status: AC

## 2023-04-26 MED ORDER — ASPIRIN 81 MG PO CHEW
81.0000 mg | CHEWABLE_TABLET | ORAL | Status: AC
  Administered 2023-04-26: 81 mg via ORAL
  Filled 2023-04-26: qty 1

## 2023-04-26 MED ORDER — MIDAZOLAM HCL 2 MG/2ML IJ SOLN
INTRAMUSCULAR | Status: DC | PRN
  Administered 2023-04-26: 2 mg via INTRAVENOUS

## 2023-04-26 MED ORDER — LIDOCAINE HCL (PF) 1 % IJ SOLN
INTRAMUSCULAR | Status: AC
  Filled 2023-04-26: qty 30

## 2023-04-26 MED ORDER — TRAZODONE HCL 50 MG PO TABS: 50.0000 mg | ORAL_TABLET | Freq: Every evening | ORAL | 0 refills | Status: AC | PRN

## 2023-04-26 MED ORDER — LANCETS MISC. MISC: 1.0000 | Freq: Three times a day (TID) | 0 refills | Status: AC

## 2023-04-26 MED ORDER — SODIUM CHLORIDE 0.9 % WEIGHT BASED INFUSION
1.0000 mL/kg/h | INTRAVENOUS | Status: DC
  Administered 2023-04-26: 1 mL/kg/h via INTRAVENOUS

## 2023-04-26 MED ORDER — FENTANYL CITRATE (PF) 100 MCG/2ML IJ SOLN
INTRAMUSCULAR | Status: AC
  Filled 2023-04-26: qty 2

## 2023-04-26 MED ORDER — LABETALOL HCL 5 MG/ML IV SOLN: 10.0000 mg | INTRAVENOUS | Status: AC | PRN

## 2023-04-26 MED ORDER — HEPARIN SODIUM (PORCINE) 1000 UNIT/ML IJ SOLN
INTRAMUSCULAR | Status: AC
  Filled 2023-04-26: qty 10

## 2023-04-26 MED ORDER — MIDAZOLAM HCL 2 MG/2ML IJ SOLN
INTRAMUSCULAR | Status: AC
Start: 2023-04-26 — End: ?
  Filled 2023-04-26: qty 2

## 2023-04-26 MED ORDER — HEPARIN SODIUM (PORCINE) 1000 UNIT/ML IJ SOLN
INTRAMUSCULAR | Status: DC | PRN
  Administered 2023-04-26: 3500 [IU] via INTRAVENOUS

## 2023-04-26 MED ORDER — NITROGLYCERIN 0.4 MG SL SUBL: 0.4000 mg | SUBLINGUAL_TABLET | SUBLINGUAL | 12 refills | Status: AC | PRN

## 2023-04-26 MED ORDER — LANCET DEVICE MISC: 1.0000 | Freq: Three times a day (TID) | 0 refills | Status: AC

## 2023-04-26 MED ORDER — CYCLOBENZAPRINE HCL 5 MG PO TABS: 5.0000 mg | ORAL_TABLET | Freq: Two times a day (BID) | ORAL | 0 refills | Status: AC

## 2023-04-26 MED ORDER — SODIUM CHLORIDE 0.9 % WEIGHT BASED INFUSION
3.0000 mL/kg/h | INTRAVENOUS | Status: DC
  Administered 2023-04-26: 3 mL/kg/h via INTRAVENOUS

## 2023-04-26 MED ORDER — SITAGLIPTIN PHOSPHATE 25 MG PO TABS: 25.0000 mg | ORAL_TABLET | Freq: Every day | ORAL | 3 refills | Status: DC

## 2023-04-26 SURGICAL SUPPLY — 7 items
CATH 5FR JL3.5 JR4 ANG PIG MP (CATHETERS) IMPLANT
DEVICE RAD COMP TR BAND LRG (VASCULAR PRODUCTS) IMPLANT
GLIDESHEATH SLEND SS 6F .021 (SHEATH) IMPLANT
GUIDEWIRE INQWIRE 1.5J.035X260 (WIRE) IMPLANT
INQWIRE 1.5J .035X260CM (WIRE) ×1
PACK CARDIAC CATHETERIZATION (CUSTOM PROCEDURE TRAY) ×1 IMPLANT
SET ATX-X65L (MISCELLANEOUS) IMPLANT

## 2023-04-26 NOTE — Telephone Encounter (Signed)
Pt has a hosp f/up scheduled with Dr Veto Kemps for 05/05/23. She was admitted, the hosp called and scheduled this.

## 2023-04-26 NOTE — Discharge Summary (Signed)
Physician Discharge Summary   Patient: Jill Anderson MRN: [id number] DOB: [birth year]  Admit date:     04/24/2023  Discharge date: 04/26/23  Discharge Physician: Thad Ranger, MD    PCP: Loyola Mast, MD   Recommendations at discharge:   Placed on Protonix 40 mg twice daily for 2 weeks then 40 mg daily.  Counseled patient to avoid NSAIDs Placed on Januvia 25 mg daily.  Also recommended lifestyle changes.   Please follow hemoglobin A1c in 3 months.  Discharge Diagnoses:    Chest pain   Type 2 diabetes mellitus (HCC)   Hyperlipidemia   Overweight Chronic back pain Insomnia  Hospital Course:   Patient is a 57 year old female with HTN, dyslipidemia, diabetes mellitus type 2, family history of coronary artery disease presented with sharp chest pressure that started a night before around 2 AM.  The symptoms woke the patient from sleep.  Patient reported experiencing minor chest pains for the 2 days with lightheadedness, pressure of the head, pain radiating down to the arm and tingling in the fingers.  No nausea vomiting or diaphoresis.  Also reported palpitations and history of leg cramps. In ED, vital signs stable, BP 159/90.  Troponins x 2 negative.  EKG without significant ischemic changes. Cardiology was consulted, started on IV heparin drip Admitted for further workup.  Assessment and Plan:  Chest pain -Risk factors include hypertension, hyperlipidemia, diabetes mellitus, strong family history of CAD -Chest x-ray negative -Troponins negative, no acute ischemic changes on the EKG.  D-dimer <0.27.  ESR, CRP normal. -Cardiology was consulted, patient was placed on IV heparin drip, continued on aspirin, Zetia -2D echo showed EF of 55 to 60%, grade 1 diastolic dysfunction, no regional WMA -carotid duplex showed near normal with only minimal wall plaque bilateral renal arteries -Underwent cardiac cath, mild nonobstructive disease in the proximal RCA and the proximal LAD,  recommended medical management of mild CAD -Patient has been taking NSAIDs for her chronic back pain and DJD, placed on Protonix 40 mg twice daily for 2 weeks then continue daily.  Counseled on avoiding NSAIDs.      Type 2 diabetes mellitus (HCC), NIDDM -Hemoglobin A1c 6.7 on 03/19/2023, not on any oral hypoglycemics (does not want metformin, has caused GI upset in the past, has allergy to oral sulfonylurea/glipizide) -Recommended Januvia 25 mg daily, lifestyle changes -Outpatient follow-up with PCP       Hyperlipidemia -Prior inability to tolerate atorvastatin due to myalgias -Continue Zetia Lipid panel showed LDL 106, cholesterol 192, triglycerides 175  Chronic back pain, DJD -Patient noted to significant back pain, follows neurosurgery outpatient, does not want fusion surgery -Has been taking NSAIDs.  Placed on Flexeril 5 mg twice daily, continue Tylenol 3, outpatient follow-up with neurosurgery -Recommended to avoid NSAIDs  Insomnia -Placed on low-dose trazodone as needed     Overweight Estimated body mass index is 28.53 kg/m as calculated from the following:   Height as of this encounter: 5\' 2"  (1.575 m).   Weight as of this encounter: 70.8 kg.     Pain control - [employer] Controlled Substance Reporting System database was reviewed. and patient was instructed, not to drive, operate heavy machinery, perform activities at heights, swimming or participation in water activities or provide baby-sitting services while on Pain, Sleep and Anxiety Medications; until their outpatient Physician has advised to do so again. Also recommended to not to take more than prescribed Pain, Sleep and Anxiety Medications.  Consultants: Cardiology Procedures performed: Echo, cardiac cath Disposition: Home  Diet recommendation: Carb modified diet  DISCHARGE MEDICATION: Allergies as of 04/26/2023       Reactions   Lipitor [atorvastatin] Other (See Comments)   Myalgia    Glucotrol  [glipizide] Other (See Comments)   Burning in stomach        Medication List     STOP taking these medications    ibuprofen 800 MG tablet Commonly known as: ADVIL   naproxen 500 MG tablet Commonly known as: Naprosyn       TAKE these medications    acetaminophen-codeine 300-30 MG tablet Commonly known as: TYLENOL #3 TAKE 1 TABLET BY MOUTH EVERY 8 HOURS AS NEEDED FOR MODERATE PAIN   aspirin EC 81 MG tablet Take 1 tablet (81 mg total) by mouth daily. What changed: when to take this   Blood Glucose Monitoring Suppl Devi 1 each by Does not apply route in the morning, at noon, and at bedtime. May substitute to any manufacturer covered by patient's insurance.   BLOOD GLUCOSE TEST STRIPS Strp 1 each by In Vitro route in the morning, at noon, and at bedtime. May substitute to any manufacturer covered by patient's insurance.   cyclobenzaprine 5 MG tablet Commonly known as: FLEXERIL Take 1 tablet (5 mg total) by mouth 2 (two) times daily.   ezetimibe 10 MG tablet Commonly known as: ZETIA Take 1 tablet (10 mg total) by mouth daily. What changed: when to take this   fluticasone 50 MCG/ACT nasal spray Commonly known as: FLONASE Place 1 spray into both nostrils in the morning and at bedtime. What changed:  when to take this reasons to take this   Lancet Device Misc 1 each by Does not apply route in the morning, at noon, and at bedtime. May substitute to any manufacturer covered by patient's insurance.   Lancets Misc. Misc 1 each by Does not apply route in the morning, at noon, and at bedtime. May substitute to any manufacturer covered by patient's insurance.   nitroGLYCERIN 0.4 MG SL tablet Commonly known as: NITROSTAT Place 1 tablet (0.4 mg total) under the tongue every 5 (five) minutes x 3 doses as needed for chest pain.   ondansetron 4 MG tablet Commonly known as: ZOFRAN Take 1 tablet (4 mg total) by mouth every 8 (eight) hours as needed for nausea or vomiting.    pantoprazole 40 MG tablet Commonly known as: Protonix Take Protonix 40 mg twice a day for 2 weeks, then once daily daily thereafter What changed:  how much to take how to take this when to take this additional instructions   sitaGLIPtin 25 MG tablet Commonly known as: Januvia Take 1 tablet (25 mg total) by mouth daily.   traZODone 50 MG tablet Commonly known as: DESYREL Take 1 tablet (50 mg total) by mouth at bedtime as needed for sleep.        Follow-up Information     Loyola Mast, MD. Schedule an appointment as soon as possible for a visit in 2 week(s).   Specialty: Family Medicine Why: for hospital follow-up Contact information: [address] [place] [address] [phone]                Discharge Exam: Ceasar Mons Weights   04/24/23 0505 04/26/23 0500  Weight: 70.8 kg 70.6 kg   S: No acute complaints, overall improving, negative cardiac cath.  BP 125/72 (BP Location: Left Arm)   Pulse 63   Temp 97.7 F (36.5 C) (Oral)   Resp 18   Ht  5\' 2"  (1.575 m)   Wt 70.6 kg   SpO2 96%   BMI 28.47 kg/m   Physical Exam General: Alert and oriented x 3, NAD Cardiovascular: S1 S2 clear, RRR.  Respiratory: CTAB, no wheezing Gastrointestinal: Soft, nontender, nondistended, NBS Ext: no pedal edema bilaterally Neuro: no new deficits Psych: Normal affect   Condition at discharge: fair  The results of significant diagnostics from this hospitalization (including imaging, microbiology, ancillary and laboratory) are listed below for reference.   Imaging Studies: CARDIAC CATHETERIZATION  Result Date: 04/26/2023 Mild non-obstructive disease in the proximal RCA and in the proximal LAD. LVEDP=17 mmHg Recommendations: medical management of mild CAD. Explore other causes of chest pain.   ECHOCARDIOGRAM COMPLETE  Result Date: 04/26/2023    ECHOCARDIOGRAM REPORT   Patient Name:   Zo Watt Date of Exam: 04/26/2023 Medical Rec #:  [id number]    Height:        62.0 in Accession #:    [id number]   Weight:       155.6 lb Date of Birth:  11/02/1965    BSA:          1.718 m Patient Age:    57 years     BP:           122/69 mmHg Patient Gender: F            HR:           60 bpm. Exam Location:  Inpatient Procedure: 2D Echo, Cardiac Doppler and Color Doppler Indications:    Chest Pain R07.9  History:        Patient has no prior history of Echocardiogram examinations.                 Signs/Symptoms:Chest Pain; Risk Factors:Hypertension, Diabetes                 and Non-Smoker.  Sonographer:    Dondra Prader RVT RCS Referring Phys: [id number] Lennart Pall STRADER IMPRESSIONS  1. Left ventricular ejection fraction, by estimation, is 55 to 60%. The left ventricle has normal function. The left ventricle has no regional wall motion abnormalities. Left ventricular diastolic parameters are consistent with Grade I diastolic dysfunction (impaired relaxation).  2. Right ventricular systolic function is normal. The right ventricular size is mildly enlarged. Tricuspid regurgitation signal is inadequate for assessing PA pressure.  3. The mitral valve is normal in structure. No evidence of mitral valve regurgitation. No evidence of mitral stenosis.  4. The aortic valve is tricuspid. Aortic valve regurgitation is not visualized. No aortic stenosis is present.  5. The inferior vena cava is normal in size with greater than 50% respiratory variability, suggesting right atrial pressure of 3 mmHg. FINDINGS  Left Ventricle: Left ventricular ejection fraction, by estimation, is 55 to 60%. The left ventricle has normal function. The left ventricle has no regional wall motion abnormalities. The left ventricular internal cavity size was normal in size. There is  no left ventricular hypertrophy. Left ventricular diastolic parameters are consistent with Grade I diastolic dysfunction (impaired relaxation). Right Ventricle: The right ventricular size is mildly enlarged. No increase in right ventricular wall  thickness. Right ventricular systolic function is normal. Tricuspid regurgitation signal is inadequate for assessing PA pressure. Left Atrium: Left atrial size was normal in size. Right Atrium: Right atrial size was normal in size. Pericardium: There is no evidence of pericardial effusion. Mitral Valve: The mitral valve is normal in structure. No evidence of mitral valve regurgitation. No evidence of  mitral valve stenosis. Tricuspid Valve: The tricuspid valve is normal in structure. Tricuspid valve regurgitation is not demonstrated. Aortic Valve: The aortic valve is tricuspid. Aortic valve regurgitation is not visualized. No aortic stenosis is present. Aortic valve mean gradient measures 2.0 mmHg. Aortic valve peak gradient measures 4.6 mmHg. Aortic valve area, by VTI measures 2.42 cm. Pulmonic Valve: The pulmonic valve was normal in structure. Pulmonic valve regurgitation is trivial. Aorta: The aortic root is normal in size and structure. Venous: The inferior vena cava is normal in size with greater than 50% respiratory variability, suggesting right atrial pressure of 3 mmHg. IAS/Shunts: No atrial level shunt detected by color flow Doppler.  LEFT VENTRICLE PLAX 2D LVIDd:         4.40 cm   Diastology LVIDs:         2.50 cm   LV e' medial:    5.84 cm/s LV PW:         1.00 cm   LV E/e' medial:  10.3 LV IVS:        0.90 cm   LV e' lateral:   8.27 cm/s LVOT diam:     1.90 cm   LV E/e' lateral: 7.3 LV SV:         56 LV SV Index:   33 LVOT Area:     2.84 cm  RIGHT VENTRICLE             IVC RV Basal diam:  3.50 cm     IVC diam: 1.10 cm RV S prime:     10.90 cm/s TAPSE (M-mode): 1.9 cm LEFT ATRIUM             Index        RIGHT ATRIUM           Index LA diam:        3.10 cm 1.80 cm/m   RA Area:     10.70 cm LA Vol (A2C):   37.7 ml 21.94 ml/m  RA Volume:   21.70 ml  12.63 ml/m LA Vol (A4C):   31.0 ml 18.04 ml/m LA Biplane Vol: 34.5 ml 20.08 ml/m  AORTIC VALVE                    PULMONIC VALVE AV Area (Vmax):    2.30  cm     PV Vmax:       0.70 m/s AV Area (Vmean):   2.30 cm     PV Peak grad:  2.0 mmHg AV Area (VTI):     2.42 cm AV Vmax:           107.00 cm/s AV Vmean:          69.400 cm/s AV VTI:            0.231 m AV Peak Grad:      4.6 mmHg AV Mean Grad:      2.0 mmHg LVOT Vmax:         86.90 cm/s LVOT Vmean:        56.300 cm/s LVOT VTI:          0.197 m LVOT/AV VTI ratio: 0.85  AORTA Ao Root diam: 2.80 cm Ao Asc diam:  2.50 cm MITRAL VALVE MV Area (PHT): 3.42 cm    SHUNTS MV Decel Time: 222 msec    Systemic VTI:  0.20 m MV E velocity: 60.10 cm/s  Systemic Diam: 1.90 cm MV A velocity: 69.40 cm/s MV E/A ratio:  0.87  [employer] Electronically signed by Wilfred Lacy Signature Date/Time: 04/26/2023/9:10:53 AM    Final    VAS US CAROTID  Result Date: 04/25/2023 Carotid Arterial Duplex Study Patient Name:  LATORYA SWOFFORD  Date of Exam:   04/24/2023 Medical Rec #: [id number]     Accession #:    [id number] Date of Birth: 1965-10-01     Patient Gender: F Patient Age:   66 years Exam Location:  [facility] Procedure:      VAS US CAROTID Referring Phys: Randall An --------------------------------------------------------------------------------  Indications:       Dizziness. Risk Factors:      Hyperlipidemia, Diabetes, coronary artery disease. Other Factors:     Patient states she has history of Left ICA stenosis over 13                    years ago but has not had a follow-up. Comparison Study:  No prior study on file Performing Technologist: Sherren Kerns RVS  Examination Guidelines: A complete evaluation includes B-mode imaging, spectral Doppler, color Doppler, and power Doppler as needed of all accessible portions of each vessel. Bilateral testing is considered an integral part of a complete examination. Limited examinations for reoccurring indications may be performed as noted.  Right Carotid Findings: +----------+--------+--------+--------+------------------+------------------+           PSV cm/sEDV  cm/sStenosisPlaque DescriptionComments           +----------+--------+--------+--------+------------------+------------------+ CCA Prox  74      17                                intimal thickening +----------+--------+--------+--------+------------------+------------------+ CCA Distal79      26                                intimal thickening +----------+--------+--------+--------+------------------+------------------+ ICA Prox  75      31              heterogenous                         +----------+--------+--------+--------+------------------+------------------+ ICA Mid   88      35                                                   +----------+--------+--------+--------+------------------+------------------+ ICA Distal106     43                                                   +----------+--------+--------+--------+------------------+------------------+ ECA       88      13                                                   +----------+--------+--------+--------+------------------+------------------+ +----------+--------+-------+--------+-------------------+           PSV cm/sEDV cmsDescribeArm Pressure (mmHG) +----------+--------+-------+--------+-------------------+ [id number]                                        +----------+--------+-------+--------+-------------------+ +---------+--------+--+--------+--+  VertebralPSV cm/s52EDV cm/s15 +---------+--------+--+--------+--+  Left Carotid Findings: +----------+--------+--------+--------+------------------+------------------+           PSV cm/sEDV cm/sStenosisPlaque DescriptionComments           +----------+--------+--------+--------+------------------+------------------+ CCA Prox  98      28                                intimal thickening +----------+--------+--------+--------+------------------+------------------+ CCA Distal85      28                                intimal  thickening +----------+--------+--------+--------+------------------+------------------+ ICA Prox  106     29              heterogenous                         +----------+--------+--------+--------+------------------+------------------+ ICA Mid   78      24                                                   +----------+--------+--------+--------+------------------+------------------+ ICA Distal106     42                                                   +----------+--------+--------+--------+------------------+------------------+ ECA       85      17                                                   +----------+--------+--------+--------+------------------+------------------+ +----------+--------+--------+--------+-------------------+           PSV cm/sEDV cm/sDescribeArm Pressure (mmHG) +----------+--------+--------+--------+-------------------+ Subclavian162                                         +----------+--------+--------+--------+-------------------+ +---------+--------+--+--------+--+ VertebralPSV cm/s76EDV cm/s14 +---------+--------+--+--------+--+   Summary: Right Carotid: The extracranial vessels were near-normal with only minimal wall                thickening or plaque. Left Carotid: The extracranial vessels were near-normal with only minimal wall               thickening or plaque. Vertebrals:  Bilateral vertebral arteries demonstrate antegrade flow. Subclavians: Normal flow hemodynamics were seen in bilateral subclavian              arteries. *See table(s) above for measurements and observations.  Electronically signed by Gerarda Fraction on 04/25/2023 at 12:34:30 PM.    Final    DG Chest 2 View  Result Date: 04/24/2023 CLINICAL DATA:  57 year old female with chest pain. EXAM: CHEST - 2 VIEW COMPARISON:  CT chest 06/13/2021 and earlier. FINDINGS: PA and lateral views 0513 hours. Lung volumes are stable and within normal limits. Visualized tracheal air  column is within normal limits. Lung markings appear stable since 2016, within normal limits. Both lungs appear clear. No  pneumothorax or pleural effusion. Negative visible bowel gas and osseous structures. IMPRESSION: Negative.  No acute cardiopulmonary abnormality. Electronically Signed   By: Odessa Fleming M.D.   On: 04/24/2023 05:37    Microbiology: Results for orders placed or performed during the hospital encounter of 04/24/23  MRSA Next Gen by PCR, Nasal     Status: None   Collection Time: 04/24/23  1:26 PM   Specimen: Nasal Mucosa; Nasal Swab  Result Value Ref Range Status   MRSA by PCR Next Gen NOT DETECTED NOT DETECTED Final    Comment: (NOTE) The GeneXpert MRSA Assay (FDA approved for NASAL specimens only), is one component of a comprehensive MRSA colonization surveillance program. It is not intended to diagnose MRSA infection nor to guide or monitor treatment for MRSA infections. Test performance is not FDA approved in patients less than 22 years old. Performed at [facility], [address], [place], [place] [address]     Labs: CBC: Recent Labs  Lab 04/24/23 0513 04/25/23 0705 04/26/23 0212  WBC 7.0 6.5 7.2  HGB 15.0 14.2 14.1  HCT 45.2 43.6 42.7  MCV 90.8 92.0 91.6  PLT 305 262 273   Basic Metabolic Panel: Recent Labs  Lab 04/24/23 0513 04/25/23 0705 04/26/23 0212  NA 137 136 134*  K 3.6 4.5 4.0  CL 105 102 104  CO2 22 25 24   GLUCOSE 156* 125* 141*  BUN 10 10 12   CREATININE 0.83 0.82 0.76  CALCIUM 9.5 9.4 9.2   Liver Function Tests: No results for input(s): "AST", "ALT", "ALKPHOS", "BILITOT", "PROT", "ALBUMIN" in the last 168 hours. CBG: Recent Labs  Lab 04/25/23 1149 04/25/23 1536 04/25/23 2139 04/26/23 0614  GLUCAP 142* 138* 186* 133*    Discharge time spent: greater than 30 minutes.  Signed: Thad Ranger, MD [facility] 04/26/2023

## 2023-04-26 NOTE — Progress Notes (Addendum)
Patient provided with verbal discharge instructions. Paper copy of discharge provided to patient. RN answered all questions. VSS at discharge. IV removed. Patient belongings sent with patient. Paper prescriptions provided to patient. Patient dc'd via wheelchair to private vehicle.

## 2023-04-26 NOTE — Progress Notes (Signed)
Mobility Specialist Progress Note:   04/26/23 1010  Mobility  Activity Ambulated independently in hallway  Level of Assistance Modified independent, requires aide device or extra time  Assistive Device Other (Comment) (IV Pole)  Distance Ambulated (ft) 275 ft  Activity Response Tolerated well  Mobility Referral Yes  $Mobility charge 1 Mobility  Mobility Specialist Start Time (ACUTE ONLY) 1010  Mobility Specialist Stop Time (ACUTE ONLY) 1020  Mobility Specialist Time Calculation (min) (ACUTE ONLY) 10 min   Pt agreeable to mobility session. Required no physical assistance throughout ambulation. No c/o throughout. Pt back in bed with all needs met.   Jill Anderson Mobility Specialist Please contact via SecureChat or  Rehab office at [phone]

## 2023-04-26 NOTE — Progress Notes (Signed)
*  PRELIMINARY RESULTS* Echocardiogram 2D Echocardiogram has been performed.  [name] 04/26/2023, 8:47 AM

## 2023-04-26 NOTE — TOC Transition Note (Signed)
Transition of Care [facility]) - CM/SW Discharge Note   Patient Details  Name: Jill Anderson MRN: [id number] Date of Birth: 28-Sep-1965  Transition of [facility]) CM/SW Contact:  Harriet Masson, RN Phone Number: 04/26/2023, 4:17 PM   Clinical Narrative:    Patient stable for discharge.  No TOC needs at this time.   Final next level of care: Home/Self Care Barriers to Discharge: Barriers Resolved   Patient Goals and CMS Choice    Return home  Discharge Placement            home             Discharge Plan and Services Additional resources added to the After Visit Summary for                                       Social Determinants of Health (SDOH) Interventions SDOH Screenings   Food Insecurity: No Food Insecurity (04/24/2023)  Housing: Low Risk  (04/24/2023)  Transportation Needs: No Transportation Needs (04/24/2023)  Utilities: Not At Risk (04/24/2023)  Alcohol Screen: Low Risk  (03/01/2023)  Depression (PHQ2-9): Low Risk  (03/01/2023)  Financial Resource Strain: Low Risk  (03/01/2023)  Physical Activity: Insufficiently Active (03/01/2023)  Social Connections: Moderately Isolated (03/01/2023)  Stress: No Stress Concern Present (03/01/2023)  Tobacco Use: Low Risk  (04/24/2023)  Health Literacy: Adequate Health Literacy (03/01/2023)     Readmission Risk Interventions    04/26/2023    4:17 PM  Readmission Risk Prevention Plan  Post Dischage Appt Complete  Medication Screening Complete  Transportation Screening Complete

## 2023-04-26 NOTE — Progress Notes (Signed)
Progress Note  Patient Name: Jill Anderson Date of Encounter: 04/26/2023  Primary Cardiologist: Reatha Harps, MD   Subjective   Patient seen and examined at her bedside. She is status post LHC.   Inpatient Medications    Scheduled Meds:  aspirin  81 mg Oral Daily   ezetimibe  10 mg Oral QPM   insulin aspart  0-9 Units Subcutaneous TID WC   nitroGLYCERIN  1 inch Topical Q6H   sodium chloride flush  3 mL Intravenous Q12H   Continuous Infusions:  sodium chloride 1 mL/kg/hr (04/26/23 0505)   heparin 900 Units/hr (04/25/23 2116)   PRN Meds: acetaminophen **OR** acetaminophen, albuterol, hydrALAZINE, morphine injection, nitroGLYCERIN, ondansetron **OR** ondansetron (ZOFRAN) IV   Vital Signs    Vitals:   04/25/23 1534 04/25/23 1943 04/25/23 2300 04/26/23 0500  BP: 121/71 120/70 130/79 125/70  Pulse:  81 68 64  Resp:  16 18 19   Temp: 98.1 F (36.7 C) 98.2 F (36.8 C) 98.1 F (36.7 C) 98.1 F (36.7 C)  TempSrc: Oral Oral Oral Oral  SpO2:  97% 97% 96%  Weight:    70.6 kg  Height:        Intake/Output Summary (Last 24 hours) at 04/26/2023 0757 Last data filed at 04/25/2023 1850 Gross per 24 hour  Intake 321.32 ml  Output --  Net 321.32 ml   Filed Weights   04/24/23 0505 04/26/23 0500  Weight: 70.8 kg 70.6 kg    Telemetry    Sinus rhythm  - Personally Reviewed  ECG     - Personally Reviewed  Physical Exam   General: Comfortable Head: Atraumatic, normal size  Eyes: PEERLA, EOMI  Neck: Supple, normal JVD Cardiac: Normal S1, S2; RRR; no murmurs, rubs, or gallops Lungs: Clear to auscultation bilaterally Abd: Soft, nontender, no hepatomegaly  Ext: warm, no edema Musculoskeletal: No deformities, BUE and BLE strength normal and equal Skin: Warm and dry, no rashes   Neuro: Alert and oriented to person, place, time, and situation, CNII-XII grossly intact, no focal deficits  Psych: Normal mood and affect   Labs    Chemistry Recent Labs  Lab  04/24/23 0513 04/25/23 0705 04/26/23 0212  NA 137 136 134*  K 3.6 4.5 4.0  CL 105 102 104  CO2 22 25 24   GLUCOSE 156* 125* 141*  BUN 10 10 12   CREATININE 0.83 0.82 0.76  CALCIUM 9.5 9.4 9.2  GFRNONAA >60 >60 >60  ANIONGAP 10 9 6      Hematology Recent Labs  Lab 04/24/23 0513 04/25/23 0705 04/26/23 0212  WBC 7.0 6.5 7.2  RBC 4.98 4.74 4.66  HGB 15.0 14.2 14.1  HCT 45.2 43.6 42.7  MCV 90.8 92.0 91.6  MCH 30.1 30.0 30.3  MCHC 33.2 32.6 33.0  RDW 11.9 11.8 11.8  PLT 305 262 273    Cardiac EnzymesNo results for input(s): "TROPONINI" in the last 168 hours. No results for input(s): "TROPIPOC" in the last 168 hours.   BNPNo results for input(s): "BNP", "PROBNP" in the last 168 hours.   DDimer  Recent Labs  Lab 04/24/23 0902  DDIMER <0.27     Radiology    VAS US CAROTID  Result Date: 04/25/2023 Carotid Arterial Duplex Study Patient Name:  Jill Anderson  Date of Exam:   04/24/2023 Medical Rec #: [id number]     Accession #:    [id number] Date of Birth: Mar 26, 1966     Patient Gender: F Patient Age:   57 years Exam Location:  [facility] Procedure:      VAS US CAROTID Referring Phys: Randall An --------------------------------------------------------------------------------  Indications:       Dizziness. Risk Factors:      Hyperlipidemia, Diabetes, coronary artery disease. Other Factors:     Patient states she has history of Left ICA stenosis over 13                    years ago but has not had a follow-up. Comparison Study:  No prior study on file Performing Technologist: Sherren Kerns RVS  Examination Guidelines: A complete evaluation includes B-mode imaging, spectral Doppler, color Doppler, and power Doppler as needed of all accessible portions of each vessel. Bilateral testing is considered an integral part of a complete examination. Limited examinations for reoccurring indications may be performed as noted.  Right Carotid Findings:  +----------+--------+--------+--------+------------------+------------------+           PSV cm/sEDV cm/sStenosisPlaque DescriptionComments           +----------+--------+--------+--------+------------------+------------------+ CCA Prox  74      17                                intimal thickening +----------+--------+--------+--------+------------------+------------------+ CCA Distal79      26                                intimal thickening +----------+--------+--------+--------+------------------+------------------+ ICA Prox  75      31              heterogenous                         +----------+--------+--------+--------+------------------+------------------+ ICA Mid   88      35                                                   +----------+--------+--------+--------+------------------+------------------+ ICA Distal106     43                                                   +----------+--------+--------+--------+------------------+------------------+ ECA       88      13                                                   +----------+--------+--------+--------+------------------+------------------+ +----------+--------+-------+--------+-------------------+           PSV cm/sEDV cmsDescribeArm Pressure (mmHG) +----------+--------+-------+--------+-------------------+ [id number]                                        +----------+--------+-------+--------+-------------------+ +---------+--------+--+--------+--+ VertebralPSV cm/s52EDV cm/s15 +---------+--------+--+--------+--+  Left Carotid Findings: +----------+--------+--------+--------+------------------+------------------+           PSV cm/sEDV cm/sStenosisPlaque DescriptionComments           +----------+--------+--------+--------+------------------+------------------+ CCA Prox  98      28  intimal thickening  +----------+--------+--------+--------+------------------+------------------+ CCA Distal85      28                                intimal thickening +----------+--------+--------+--------+------------------+------------------+ ICA Prox  106     29              heterogenous                         +----------+--------+--------+--------+------------------+------------------+ ICA Mid   78      24                                                   +----------+--------+--------+--------+------------------+------------------+ ICA Distal106     42                                                   +----------+--------+--------+--------+------------------+------------------+ ECA       85      17                                                   +----------+--------+--------+--------+------------------+------------------+ +----------+--------+--------+--------+-------------------+           PSV cm/sEDV cm/sDescribeArm Pressure (mmHG) +----------+--------+--------+--------+-------------------+ Subclavian162                                         +----------+--------+--------+--------+-------------------+ +---------+--------+--+--------+--+ VertebralPSV cm/s76EDV cm/s14 +---------+--------+--+--------+--+   Summary: Right Carotid: The extracranial vessels were near-normal with only minimal wall                thickening or plaque. Left Carotid: The extracranial vessels were near-normal with only minimal wall               thickening or plaque. Vertebrals:  Bilateral vertebral arteries demonstrate antegrade flow. Subclavians: Normal flow hemodynamics were seen in bilateral subclavian              arteries. *See table(s) above for measurements and observations.  Electronically signed by Gerarda Fraction on 04/25/2023 at 12:34:30 PM.    Final     Cardiac Studies   LHC   Patient Profile     57 y.o. female with chest pain   Assessment & Plan    Unstable angina  DM2     She is status post LHC earlier today, mild CAD seen no need for any PCI. Continue current lipid lowering regimen - LDL goal is <55.  DM2 per primary team.      For questions or updates, please contact [facility] Please consult [email or web address] for contact info under Cardiology/STEMI.      Signed, Thomasene Ripple, DO  04/26/2023, 7:58 AM

## 2023-04-26 NOTE — Interval H&P Note (Signed)
History and Physical Interval Note:  04/26/2023 11:34 AM  Jill Anderson  has presented today for surgery, with the diagnosis of unstable angina.  The various methods of treatment have been discussed with the patient and family. After consideration of risks, benefits and other options for treatment, the patient has consented to  Procedure(s): LEFT HEART CATH AND CORONARY ANGIOGRAPHY (N/A) as a surgical intervention.  The patient's history has been reviewed, patient examined, no change in status, stable for surgery.  I have reviewed the patient's chart and labs.  Questions were answered to the patient's satisfaction.    Cath Lab Visit (complete for each Cath Lab visit)  Clinical Evaluation Leading to the Procedure:   ACS: No.  Non-ACS:    Anginal Classification: CCS III  Anti-ischemic medical therapy: No Therapy  Non-Invasive Test Results: No non-invasive testing performed  Prior CABG: No previous CABG        Verne Carrow

## 2023-04-26 NOTE — TOC CM/SW Note (Signed)
Transition of Care [facility]) - Inpatient Brief Assessment   Patient Details  Name: Shaunae Ramdass MRN: [id number] Date of Birth: 1965-11-10  Transition of Care [facility]) CM/SW Contact:    Harriet Masson, RN Phone Number: 04/26/2023, 1:25 PM   Clinical Narrative:  Patient admitted for chest pain. Heart cath scheduled for today.  No TOC needs at this time.   Transition of Care Asessment: Insurance and Status: Insurance coverage has been reviewed Patient has primary care physician: Yes Home environment has been reviewed: safe to discharge home Prior level of function:: independent Prior/Current Home Services: No current home services Social Determinants of Health Reivew: SDOH reviewed no interventions necessary Readmission risk has been reviewed: Yes Transition of care needs: no transition of care needs at this time

## 2023-04-26 NOTE — Progress Notes (Signed)
PHARMACY - ANTICOAGULATION CONSULT NOTE  Pharmacy Consult for heparin  Indication: chest pain/ACS  Allergies  Allergen Reactions   Lipitor [Atorvastatin] Other (See Comments)    Myalgia    Glucotrol [Glipizide] Other (See Comments)    Burning in stomach    Patient Measurements: Height: 5\' 2"  (157.5 cm) Weight: 70.8 kg (156 lb) IBW/kg (Calculated) : 50.1 Heparin Dosing Weight: 65 kg   Vital Signs: Temp: 98.1 F (36.7 C) (11/24 2300) Temp Source: Oral (11/24 2300) BP: 130/79 (11/24 2300) Pulse Rate: 68 (11/24 2300)  Labs: Recent Labs    04/24/23 0513 04/24/23 0652 04/24/23 2130 04/25/23 0705 04/25/23 1237 04/25/23 2014 04/26/23 0212  HGB 15.0  --   --  14.2  --   --  14.1  HCT 45.2  --   --  43.6  --   --  42.7  PLT 305  --   --  262  --   --  273  HEPARINUNFRC  --   --    < > 0.37 0.26* 0.26* 0.42  CREATININE 0.83  --   --  0.82  --   --  0.76  TROPONINIHS 4 3  --   --   --   --   --    < > = values in this interval not displayed.    Estimated Creatinine Clearance: 70.7 mL/min (by C-G formula based on SCr of 0.76 mg/dL).   Medical History: Past Medical History:  Diagnosis Date   Borderline diabetic    Chronic back pain    Diabetes mellitus without complication (HCC)    Hypercholesteremia     Assessment: Jill Anderson presenting to [facility] ED with chest pain. Pharmacy consulted to dose heparin d/t concern for ACS/STEMI. No anticoagulants PTA.   - Heparin level therapeutic at 0.42 at 900 units/hr. CBC stable and no signs symptoms of bleeding reported. Plans for cath lab on 11/25  Goal of Therapy:  Heparin level 0.3-0.7 units/ml Monitor platelets by anticoagulation protocol: Yes   Plan:  Continue heparin heparin to 900 units/hr Daily heparin level, CBC  Follow up plans for cath lab  Arabella Merles, PharmD. Clinical Pharmacist 04/26/2023 3:45 AM

## 2023-04-26 NOTE — Plan of Care (Signed)
  Per RN, radial cath site oozing, cardiology evaluating.  Will hold discharge until stable from cardiology standpoint.   Thad Ranger M.D.  [facility] 04/26/2023, 2:37 PM

## 2023-04-27 ENCOUNTER — Ambulatory Visit: Payer: Medicare HMO | Attending: Home Health

## 2023-04-27 ENCOUNTER — Other Ambulatory Visit: Payer: Self-pay | Admitting: Home Health

## 2023-04-27 ENCOUNTER — Telehealth: Payer: Self-pay

## 2023-04-27 DIAGNOSIS — R002 Palpitations: Secondary | ICD-10-CM

## 2023-04-27 NOTE — Progress Notes (Unsigned)
Enrolled patient for a 14 day Zio XT monitor to be mailed to patients home  Hilty to read

## 2023-04-27 NOTE — Transitions of Care (Post Inpatient/ED Visit) (Signed)
04/27/2023  Name: Jill Anderson MRN: [id number] DOB: Jun 18, 1965  Today's TOC FU Call Status: Today's TOC FU Call Status:: Successful TOC FU Call Completed TOC FU Call Complete Date: 04/27/23 Patient's Name and Date of Birth confirmed.  Transition Care Management Follow-up Telephone Call Date of Discharge: 04/26/23 Discharge Facility: [facility]) Type of Discharge: Inpatient Admission Primary Inpatient Discharge Diagnosis:: chest pain unspecified How have you been since you were released from the hospital?: Better Any questions or concerns?: No  Items Reviewed: Did you receive and understand the discharge instructions provided?: Yes Medications obtained,verified, and reconciled?: Yes (Medications Reviewed) Any new allergies since your discharge?: No Dietary orders reviewed?: Yes Type of Diet Ordered:: heart healthy low sodium Do you have support at home?: Yes People in Home: grandchild(ren)  Medications Reviewed Today: Medications Reviewed Today     Reviewed by Raelyn Number, CMA (Certified Medical Assistant) on 04/27/23 at [phone]  Med List Status: <None>   Medication Order Taking? Sig Documenting Provider Last Dose Status Informant  acetaminophen-codeine (TYLENOL #3) 300-30 MG tablet [id number] Yes TAKE 1 TABLET BY MOUTH EVERY 8 HOURS AS NEEDED FOR MODERATE PAIN Loyola Mast, MD Taking Active Self, Pharmacy Records  aspirin EC 81 MG tablet [id number] Yes Take 1 tablet (81 mg total) by mouth daily.  Patient taking differently: Take 81 mg by mouth every evening.   Dessa Phi, MD Taking Active Self, Pharmacy Records  Blood Glucose Monitoring Suppl DEVI [id number] Yes 1 each by Does not apply route in the morning, at noon, and at bedtime. May substitute to any manufacturer covered by patient's insurance. Rai, Delene Ruffini, MD Taking Active   cyclobenzaprine (FLEXERIL) 5 MG tablet [id number] Yes Take 1 tablet (5 mg total) by mouth 2 (two) times daily. Rai, Delene Ruffini, MD  Taking Active   ezetimibe (ZETIA) 10 MG tablet [id number] Yes Take 1 tablet (10 mg total) by mouth daily.  Patient taking differently: Take 10 mg by mouth every evening.   Loyola Mast, MD Taking Active Self, Pharmacy Records  fluticasone [facility]) 50 MCG/ACT nasal spray [id number] Yes Place 1 spray into both nostrils in the morning and at bedtime.  Patient taking differently: Place 1 spray into both nostrils 2 (two) times daily as needed for allergies.   Loyola Mast, MD Taking Active Self, Pharmacy Records  Glucose Blood (BLOOD GLUCOSE TEST STRIPS) STRP [id number] Yes 1 each by In Vitro route in the morning, at noon, and at bedtime. May substitute to any manufacturer covered by patient's insurance. Cathren Harsh, MD Taking Active   Lancet Device MISC [id number] Yes 1 each by Does not apply route in the morning, at noon, and at bedtime. May substitute to any manufacturer covered by patient's insurance. Cathren Harsh, MD Taking Active   Lancets Misc. MISC [id number] Yes 1 each by Does not apply route in the morning, at noon, and at bedtime. May substitute to any manufacturer covered by patient's insurance. Rai, Delene Ruffini, MD Taking Active   nitroGLYCERIN (NITROSTAT) 0.4 MG SL tablet [id number] Yes Place 1 tablet (0.4 mg total) under the tongue every 5 (five) minutes x 3 doses as needed for chest pain. Rai, Delene Ruffini, MD Taking Active   ondansetron (ZOFRAN) 4 MG tablet [id number] Yes Take 1 tablet (4 mg total) by mouth every 8 (eight) hours as needed for nausea or vomiting. Rai, Delene Ruffini, MD Taking Active   pantoprazole (PROTONIX) 40 MG tablet [id number] Yes Take Protonix 40 mg twice a day for  2 weeks, then once daily daily thereafter Rai, Delene Ruffini, MD Taking Active   sitaGLIPtin (JANUVIA) 25 MG tablet [id number] Yes Take 1 tablet (25 mg total) by mouth daily. Rai, Delene Ruffini, MD Taking Active   traZODone (DESYREL) 50 MG tablet [id number] Yes Take 1 tablet (50 mg total) by mouth at bedtime as  needed for sleep. Cathren Harsh, MD Taking Active             Home Care and Equipment/Supplies: Were Home Health Services Ordered?: NA Any new equipment or medical supplies ordered?: NA  Functional Questionnaire: Do you need assistance with bathing/showering or dressing?: No Do you need assistance with meal preparation?: No Do you need assistance with eating?: No Do you have difficulty maintaining continence: No Do you need assistance with getting out of bed/getting out of a chair/moving?: No Do you have difficulty managing or taking your medications?: No  Follow up appointments reviewed: PCP Follow-up appointment confirmed?: Yes Date of PCP follow-up appointment?: 05/05/23 Follow-up Provider: Dr. Herbie Drape Specialist [facility] Follow-up appointment confirmed?: No Reason Specialist Follow-Up Not Confirmed: Patient has Specialist Provider Number and will Call for Appointment Do you need transportation to your follow-up appointment?: No Do you understand care options if your condition(s) worsen?: Yes-patient verbalized understanding    Abby Margarete Horace, CMA  [facility] AWV Team Direct Dial: [phone]

## 2023-04-27 NOTE — Progress Notes (Signed)
2 week Zio ordered for palpitation, per Dr Madelyn Flavors request, Dr Rennis Golden to follow.

## 2023-05-03 NOTE — Telephone Encounter (Signed)
TOC call completed 04/27/23.

## 2023-05-05 ENCOUNTER — Encounter: Payer: Self-pay | Admitting: Family Medicine

## 2023-05-05 ENCOUNTER — Ambulatory Visit: Payer: Medicare HMO | Admitting: Family Medicine

## 2023-05-05 VITALS — BP 142/70 | HR 66 | Temp 98.2°F | Ht 62.0 in | Wt 148.8 lb

## 2023-05-05 DIAGNOSIS — I10 Essential (primary) hypertension: Secondary | ICD-10-CM | POA: Diagnosis not present

## 2023-05-05 DIAGNOSIS — E782 Mixed hyperlipidemia: Secondary | ICD-10-CM

## 2023-05-05 DIAGNOSIS — T466X5A Adverse effect of antihyperlipidemic and antiarteriosclerotic drugs, initial encounter: Secondary | ICD-10-CM | POA: Diagnosis not present

## 2023-05-05 DIAGNOSIS — R0789 Other chest pain: Secondary | ICD-10-CM

## 2023-05-05 DIAGNOSIS — I2510 Atherosclerotic heart disease of native coronary artery without angina pectoris: Secondary | ICD-10-CM | POA: Diagnosis not present

## 2023-05-05 DIAGNOSIS — E119 Type 2 diabetes mellitus without complications: Secondary | ICD-10-CM | POA: Diagnosis not present

## 2023-05-05 DIAGNOSIS — Z7984 Long term (current) use of oral hypoglycemic drugs: Secondary | ICD-10-CM | POA: Diagnosis not present

## 2023-05-05 DIAGNOSIS — M791 Myalgia, unspecified site: Secondary | ICD-10-CM

## 2023-05-05 DIAGNOSIS — J309 Allergic rhinitis, unspecified: Secondary | ICD-10-CM | POA: Diagnosis not present

## 2023-05-05 MED ORDER — FLUTICASONE PROPIONATE 50 MCG/ACT NA SUSP: 1.0000 | Freq: Two times a day (BID) | NASAL | 5 refills | Status: DC

## 2023-05-05 NOTE — Progress Notes (Signed)
[facility] Trecia Rogers [facility] [address] [place] [address] Dept: [phone] Dept Fax: [phone]  Hospital Follow-Up Visit  Subjective:    Patient ID: Jill Anderson, female    DOB: 09-16-1965, 57 y.o..   MRN: [id number]  Chief Complaint  Patient presents with   Hospitalization Follow-up    Hospital f/u for chest pain from 04/24/23.  Feeling better now. Having HA's with taking Trazodone.    History of Present Illness:  Patient is in today for follow-up from a recent hospitalization. She was admitted at [facility] from 11/23-11/25/2024 due to chest pain. Her troponins and EKG were normal. She underwent a cardiac cath that showed mild nonobstructive disease in the proximal RCA and the proximal LAD. It was determined that her chest pain was likely due to gastritis due to NSAID use. She was placed on Protonix for 2 weeks. She notes that at this point she is feeling well and having no more chest issues.  Ms. Wigfall has a history of Type 2 diabetes. She had been managed with diet and exercise. Her last A1c was 6.7%. In the hospital, they did initiate sitagliptin (Januvia) 25 mg daily.   Ms. Sanmiguel  has a history of hypertension. She has been managing this with diet and exercise.   Ms. Bellucci has a history of hyperlipidemia. She has been unable to tolerate statins due to myalgias. She is currently on ezetimibe 10 mg daily under the direction of Dr. Rennis Golden.  Past Medical History: Patient Active Problem List   Diagnosis Date Noted   Chest pain 04/24/2023   Family history of heart disease 04/24/2023   Palpitations 04/24/2023   Overweight 04/24/2023   Plantar fasciitis of left foot 11/02/2022   Hepatic steatosis 10/14/2021   Aortic atherosclerosis (HCC) 10/14/2021   Umbilical hernia 10/14/2021   Hemorrhoids 08/06/2021   Allergic rhinitis 08/06/2021   Myalgia due to statin 12/12/2018   Vitamin D deficiency 09/21/2017   Insomnia 05/14/2017    Menopausal symptoms 01/04/2017   Urticaria 12/14/2016   Pain in both hands 12/14/2016   Essential hypertension 10/27/2016   Bilateral anterior knee pain 03/20/2016   DDD (degenerative disc disease), lumbosacral 11/15/2015   Low back pain radiating to both legs 11/15/2015   Left ureteral stone 10/27/2015   New onset of headaches after age 53 10/07/2015   Hyperlipidemia 07/15/2015   Type 2 diabetes mellitus (HCC) 03/04/2015   Chronic neck pain 09/11/2014   Cervical disc disorder with radiculopathy of cervical region 09/11/2014   Chronic radicular low back pain 09/11/2014   Past Surgical History:  Procedure Laterality Date   ABDOMINAL HYSTERECTOMY     ABDOMINOPLASTY/PANNICULECTOMY WITH LIPOSUCTION  06/26/2022   Performed in [employer] [facility]   BACK SURGERY     CYSTOSCOPY WITH RETROGRADE PYELOGRAM, URETEROSCOPY AND STENT PLACEMENT Left 10/28/2015   Procedure: CYSTOSCOPY WITH RETROGRADE PYELOGRAM, URETEROSCOPY AND STENT PLACEMENT, AND LASER;  Surgeon: Bjorn Pippin, MD;  Location: [facility];  Service: Urology;  Laterality: Left;   LEFT HEART CATH AND CORONARY ANGIOGRAPHY N/A 04/26/2023   Procedure: LEFT HEART CATH AND CORONARY ANGIOGRAPHY;  Surgeon: Kathleene Hazel, MD;  Location: [facility] CV LAB;  Service: Cardiovascular;  Laterality: N/A;   Family History  Problem Relation Age of Onset   Diabetes Mother    Hypertension Mother    Heart disease Mother        based at age 81 of MI   Diabetes Father    Hypertension Father    Heart disease  Father    Cancer Maternal Aunt        liver   Colon cancer Neg Hx    Esophageal cancer Neg Hx    Stomach cancer Neg Hx    Outpatient Medications Prior to Visit  Medication Sig Dispense Refill   acetaminophen-codeine (TYLENOL #3) 300-30 MG tablet TAKE 1 TABLET BY MOUTH EVERY 8 HOURS AS NEEDED FOR MODERATE PAIN 60 tablet 0   aspirin EC 81 MG tablet Take 1 tablet (81 mg total) by mouth daily. (Patient taking differently: Take 81 mg by mouth  every evening.) 30 tablet 11   Blood Glucose Monitoring Suppl DEVI 1 each by Does not apply route in the morning, at noon, and at bedtime. May substitute to any manufacturer covered by patient's insurance. 1 each 0   cyclobenzaprine (FLEXERIL) 5 MG tablet Take 1 tablet (5 mg total) by mouth 2 (two) times daily. 30 tablet 0   ezetimibe (ZETIA) 10 MG tablet Take 1 tablet (10 mg total) by mouth daily. (Patient taking differently: Take 10 mg by mouth every evening.) 90 tablet 3   Glucose Blood (BLOOD GLUCOSE TEST STRIPS) STRP 1 each by In Vitro route in the morning, at noon, and at bedtime. May substitute to any manufacturer covered by patient's insurance. 90 each 2   Lancet Device MISC 1 each by Does not apply route in the morning, at noon, and at bedtime. May substitute to any manufacturer covered by patient's insurance. 1 each 0   Lancets Misc. MISC 1 each by Does not apply route in the morning, at noon, and at bedtime. May substitute to any manufacturer covered by patient's insurance. 100 each 0   nitroGLYCERIN (NITROSTAT) 0.4 MG SL tablet Place 1 tablet (0.4 mg total) under the tongue every 5 (five) minutes x 3 doses as needed for chest pain. 30 tablet 12   ondansetron (ZOFRAN) 4 MG tablet Take 1 tablet (4 mg total) by mouth every 8 (eight) hours as needed for nausea or vomiting. 30 tablet 0   pantoprazole (PROTONIX) 40 MG tablet Take Protonix 40 mg twice a day for 2 weeks, then once daily daily thereafter 60 tablet 3   sitaGLIPtin (JANUVIA) 25 MG tablet Take 1 tablet (25 mg total) by mouth daily. 30 tablet 3   traZODone (DESYREL) 50 MG tablet Take 1 tablet (50 mg total) by mouth at bedtime as needed for sleep. 30 tablet 0   fluticasone (FLONASE) 50 MCG/ACT nasal spray Place 1 spray into both nostrils in the morning and at bedtime. (Patient taking differently: Place 1 spray into both nostrils 2 (two) times daily as needed for allergies.) 11.1 mL 5   No facility-administered medications prior to visit.    Allergies  Allergen Reactions   Lipitor [Atorvastatin] Other (See Comments)    Myalgia    Glucotrol [Glipizide] Other (See Comments)    Burning in stomach     Objective:   Today's Vitals   05/05/23 1103  BP: (!) 142/70  Pulse: 66  Temp: 98.2 F (36.8 C)  TempSrc: Temporal  SpO2: 100%  Weight: 148 lb 12.8 oz (67.5 kg)  Height: 5\' 2"  (1.575 m)   Body mass index is 27.22 kg/m.   General: Well developed, well nourished. No acute distress. CV: RRR without murmurs or rubs. Pulses 2+ bilaterally. Feet- Skin intact. No sign of maceration between toes. Nails are normal. Dorsalis pedis and posterior tibial artery   pulses are normal. 5.07 monofilament testing normal. Psych: Alert and oriented. Normal mood  and affect.  Health Maintenance Due  Topic Date Due   Colonoscopy  06/02/2023     Assessment & Plan:   Problem List Items Addressed This Visit       Cardiovascular and Mediastinum   Coronary artery disease- Mild RCA and LAD disease    Recent cardiac cath showing mild disease. We will continue to focus on medical management.      Essential hypertension    Systolic blood pressure is mildly high today, but has been normal more recently. Continue to manage with diet and weight management. I recommend she obtain a home BP monitor and follow this.        Respiratory   Allergic rhinitis    Stable. Continue Flonase spray.      Relevant Medications   fluticasone (FLONASE) 50 MCG/ACT nasal spray     Endocrine   Type 2 diabetes mellitus (HCC)    Diabetes has been in good control. Continue sitagliptin 25 mg daily.        Other   Hyperlipidemia    Stable. Continue ezetimibe 10 mg daily.      Myalgia due to statin   Other Visit Diagnoses     Non-cardiac chest pain    -  Primary       Return in about 3 months (around 08/03/2023) for Reassessment.   Loyola Mast, MD

## 2023-05-05 NOTE — Assessment & Plan Note (Signed)
Stable. Continue ezetimibe 10 mg daily.

## 2023-05-05 NOTE — Assessment & Plan Note (Signed)
Recent cardiac cath showing mild disease. We will continue to focus on medical management.

## 2023-05-05 NOTE — Assessment & Plan Note (Signed)
Diabetes has been in good control. Continue sitagliptin 25 mg daily.

## 2023-05-05 NOTE — Assessment & Plan Note (Signed)
Stable. Continue Flonase spray.

## 2023-05-05 NOTE — Assessment & Plan Note (Signed)
Systolic blood pressure is mildly high today, but has been normal more recently. Continue to manage with diet and weight management. I recommend she obtain a home BP monitor and follow this.

## 2023-05-17 NOTE — Progress Notes (Deleted)
Cardiology Clinic Note   Patient Name: Jill Anderson Date of Encounter: 05/17/2023  Primary Care Provider:  Loyola Mast, MD Primary Cardiologist:  Reatha Harps, MD  Patient Profile    Jill Anderson 57 year old female presents the clinic today for follow-up evaluation of her palpitations.  Past Medical History    Past Medical History:  Diagnosis Date   Borderline diabetic    Chronic back pain    Diabetes mellitus without complication (HCC)    Hypercholesteremia    Past Surgical History:  Procedure Laterality Date   ABDOMINAL HYSTERECTOMY     ABDOMINOPLASTY/PANNICULECTOMY WITH LIPOSUCTION  06/26/2022   Performed in [facility] [place]   BACK SURGERY     CYSTOSCOPY WITH RETROGRADE PYELOGRAM, URETEROSCOPY AND STENT PLACEMENT Left 10/28/2015   Procedure: CYSTOSCOPY WITH RETROGRADE PYELOGRAM, URETEROSCOPY AND STENT PLACEMENT, AND LASER;  Surgeon: Bjorn Pippin, MD;  Location: [facility];  Service: Urology;  Laterality: Left;   LEFT HEART CATH AND CORONARY ANGIOGRAPHY N/A 04/26/2023   Procedure: LEFT HEART CATH AND CORONARY ANGIOGRAPHY;  Surgeon: Kathleene Hazel, MD;  Location: [facility];  Service: Cardiovascular;  Laterality: N/A;    Allergies  Allergies  Allergen Reactions   Lipitor [Atorvastatin] Other (See Comments)    Myalgia    Glucotrol [Glipizide] Other (See Comments)    Burning in stomach    History of Present Illness    Jill Anderson has a PMH of HLD, HTN, type II diabetes, and family history of coronary artery disease.  She presented to the emergency department on 04/24/2023 and was discharged on 04/26/2023.  She reported sharp chest pressure that had started around 2 AM on the prior night.  The symptoms woke her up from sleeping.  She reported experiencing minor chest pains for 2 days.  She noted lightheadedness, head pressure, pain radiating to her arm and tingling to her fingers.  She denied diaphoresis nausea and vomiting.  She also noted  palpitations and a history of leg cramps.  She was noted to have a blood pressure of 159/90.  Her troponins were negative x 2.  Her EKG did not show ischemic changes.  Cardiology was consulted.  She was placed on heparin GTT.  Her chest x-ray was negative.  Her D-dimer was less than 0.27.  Her ESR and CRP were normal.  Cardiology evaluated and she was continued on aspirin and ezetimibe.  Her echocardiogram showed an EF of 55-60%, G1 DD and no wall motion abnormalities.  Carotid duplex showed minimal wall plaque in bilateral arteries.  She underwent cardiac catheterization on 04/26/2023.  She was noted to have mild nonobstructive coronary disease.  Disease was noted in the proximal RCA, proximal LAD and medical management was recommended.  She had been taking NSAIDs for chronic back pain and degenerative joint disease.  She was placed on Protonix 40 mg twice daily for 2 weeks with plan to continue daily.  She was encouraged to avoid NSAIDs.  Her lipid panel showed an LDL of 106, total cholesterol of 192 and triglycerides of 175.  She was previously not able to tolerate atorvastatin due to myalgias.  Her ezetimibe was continued.  For her back pain she was placed on Flexeril 5 mg twice daily, encouraged to continue Tylenol 3, and encouraged to continue to follow-up with neurosurgery.  She noted palpitations.  Cardiac event monitor was ordered but has not yet resulted.  She presents to the clinic today for follow-up evaluation and states***.  *** denies chest pain,  shortness of breath, lower extremity edema, fatigue, palpitations, melena, hematuria, hemoptysis, diaphoresis, weakness, presyncope, syncope, orthopnea, and PND.  Palpitations-Notes intermittent episodes of brief palpitations.  Awaiting results of cardiac event monitor. Maintain p.o. hydration Avoid triggers caffeine, chocolate, EtOH, dehydration etc. Increase physical activity as tolerated  Coronary artery disease, chest pain-no chest pain  today.  Improvement in symptoms with Protonix dosing and avoiding NSAID therapy.  Cardiac catheterization showed nonobstructive coronary disease. Heart healthy low-sodium diet Increase physical activity as tolerated Continue Protonix Continue ezetimibe, aspirin  Essential hypertension-BP today*** Heart healthy low-sodium diet Maintain blood pressure log Increase physical activity as tolerated  Hyperlipidemia-LDL***. Continue ezetimibe, aspirin High-fiber diet  Degenerative joint disease, chronic back pain-tolerating Flexeril well. Following with neurosurgery. Avoid NSAIDs Continue Flexeril, Tylenol 3  Disposition: Follow-up with Dr. Rennis Golden or me in 3-4 months.  Home Medications    Prior to Admission medications   Medication Sig Start Date End Date Taking? Authorizing Provider  acetaminophen-codeine (TYLENOL #3) 300-30 MG tablet TAKE 1 TABLET BY MOUTH EVERY 8 HOURS AS NEEDED FOR MODERATE PAIN 02/12/23   Loyola Mast, MD  aspirin EC 81 MG tablet Take 1 tablet (81 mg total) by mouth daily. Patient taking differently: Take 81 mg by mouth every evening. 09/29/16   Funches, Gerilyn Nestle, MD  Blood Glucose Monitoring Suppl DEVI 1 each by Does not apply route in the morning, at noon, and at bedtime. May substitute to any manufacturer covered by patient's insurance. 04/26/23   Rai, Delene Ruffini, MD  cyclobenzaprine (FLEXERIL) 5 MG tablet Take 1 tablet (5 mg total) by mouth 2 (two) times daily. 04/26/23   Rai, Delene Ruffini, MD  ezetimibe (ZETIA) 10 MG tablet Take 1 tablet (10 mg total) by mouth daily. Patient taking differently: Take 10 mg by mouth every evening. 11/02/22 10/28/23  Loyola Mast, MD  fluticasone (FLONASE) 50 MCG/ACT nasal spray Place 1 spray into both nostrils in the morning and at bedtime. 05/05/23   Loyola Mast, MD  Glucose Blood (BLOOD GLUCOSE TEST STRIPS) STRP 1 each by In Vitro route in the morning, at noon, and at bedtime. May substitute to any manufacturer covered by patient's  insurance. 04/26/23 07/25/23  Cathren Harsh, MD  Lancet Device MISC 1 each by Does not apply route in the morning, at noon, and at bedtime. May substitute to any manufacturer covered by patient's insurance. 04/26/23 05/26/23  Cathren Harsh, MD  Lancets Misc. MISC 1 each by Does not apply route in the morning, at noon, and at bedtime. May substitute to any manufacturer covered by patient's insurance. 04/26/23 05/26/23  Rai, Delene Ruffini, MD  nitroGLYCERIN (NITROSTAT) 0.4 MG SL tablet Place 1 tablet (0.4 mg total) under the tongue every 5 (five) minutes x 3 doses as needed for chest pain. 04/26/23   Rai, Ripudeep K, MD  ondansetron (ZOFRAN) 4 MG tablet Take 1 tablet (4 mg total) by mouth every 8 (eight) hours as needed for nausea or vomiting. 04/26/23   Rai, Delene Ruffini, MD  pantoprazole (PROTONIX) 40 MG tablet Take Protonix 40 mg twice a day for 2 weeks, then once daily daily thereafter 04/26/23   Rai, Ripudeep K, MD  sitaGLIPtin (JANUVIA) 25 MG tablet Take 1 tablet (25 mg total) by mouth daily. 04/26/23   Rai, Delene Ruffini, MD  traZODone (DESYREL) 50 MG tablet Take 1 tablet (50 mg total) by mouth at bedtime as needed for sleep. 04/26/23   Cathren Harsh, MD    Family History  Family History  Problem Relation Age of Onset   Diabetes Mother    Hypertension Mother    Heart disease Mother        based at age 2 of MI   Diabetes Father    Hypertension Father    Heart disease Father    Cancer Maternal Aunt        liver   Colon cancer Neg Hx    Esophageal cancer Neg Hx    Stomach cancer Neg Hx    She indicated that her mother is deceased. She indicated that her father is alive. She indicated that the status of her maternal aunt is unknown. She indicated that the status of her neg hx is unknown.  Social History    Social History   Socioeconomic History   Marital status: Single    Spouse name: Not on file   Number of children: 3   Years of education: Not on file   Highest education  level: Not on file  Occupational History   Occupation: Disabled  Tobacco Use   Smoking status: Never   Smokeless tobacco: Never  Vaping Use   Vaping status: Never Used  Substance and Sexual Activity   Alcohol use: No   Drug use: Never   Sexual activity: Not Currently  Other Topics Concern   Not on file  Social History Narrative   Not on file   Social Drivers of Health   Financial Resource Strain: Low Risk  (03/01/2023)   Overall Financial Resource Strain (CARDIA)    Difficulty of Paying Living Expenses: Not hard at all  Food Insecurity: No Food Insecurity (04/24/2023)   Hunger Vital Sign    Worried About Running Out of Food in the Last Year: Never true    Ran Out of Food in the Last Year: Never true  Transportation Needs: No Transportation Needs (04/24/2023)   PRAPARE - Administrator, Civil Service (Medical): No    Lack of Transportation (Non-Medical): No  Physical Activity: Insufficiently Active (03/01/2023)   Exercise Vital Sign    Days of Exercise per Week: 2 days    Minutes of Exercise per Session: 10 min  Stress: No Stress Concern Present (03/01/2023)   [employer] of Occupational Health - Occupational Stress Questionnaire    Feeling of Stress : Not at all  Social Connections: Moderately Isolated (03/01/2023)   Social Connection and Isolation Panel [NHANES]    Frequency of Communication with Friends and Family: More than three times a week    Frequency of Social Gatherings with Friends and Family: More than three times a week    Attends Religious Services: More than 4 times per year    Active Member of [employer] or Organizations: No    Attends Banker Meetings: Never    Marital Status: Divorced  Catering manager Violence: Not At Risk (04/24/2023)   Humiliation, Afraid, Rape, and Kick questionnaire    Fear of Current or Ex-Partner: No    Emotionally Abused: No    Physically Abused: No    Sexually Abused: No     Review of Systems     General:  No chills, fever, night sweats or weight changes.  Cardiovascular:  No chest pain, dyspnea on exertion, edema, orthopnea, palpitations, paroxysmal nocturnal dyspnea. Dermatological: No rash, lesions/masses Respiratory: No cough, dyspnea Urologic: No hematuria, dysuria Abdominal:   No nausea, vomiting, diarrhea, bright red blood per rectum, melena, or hematemesis Neurologic:  No visual changes, wkns, changes in mental  status. All other systems reviewed and are otherwise negative except as noted above.  Physical Exam    VS:  There were no vitals taken for this visit. , BMI There is no height or weight on file to calculate BMI. GEN: Well nourished, well developed, in no acute distress. HEENT: normal. Neck: Supple, no JVD, carotid bruits, or masses. Cardiac: RRR, no murmurs, rubs, or gallops. No clubbing, cyanosis, edema.  Radials/DP/PT 2+ and equal bilaterally.  Respiratory:  Respirations regular and unlabored, clear to auscultation bilaterally. GI: Soft, nontender, nondistended, BS + x 4. MS: no deformity or atrophy. Skin: warm and dry, no rash. Neuro:  Strength and sensation are intact. Psych: Normal affect.  Accessory Clinical Findings    Recent Labs: 04/24/2023: TSH 2.754 04/26/2023: BUN 12; Creatinine, Ser 0.76; Hemoglobin 14.1; Platelets 273; Potassium 4.0; Sodium 134   Recent Lipid Panel    Component Value Date/Time   CHOL 192 04/26/2023 0212   CHOL 230 (H) 02/12/2021 1041   TRIG 175 (H) 04/26/2023 0212   HDL 51 04/26/2023 0212   HDL 48 02/12/2021 1041   CHOLHDL 3.8 04/26/2023 0212   VLDL 35 04/26/2023 0212   LDLCALC 106 (H) 04/26/2023 0212   LDLCALC 159 (H) 02/12/2021 1041    No BP recorded.  {Refresh Note OR Click here to enter BP  :1}***    ECG personally reviewed by me today- ***     Echocardiogram 04/26/2023  IMPRESSIONS     1. Left ventricular ejection fraction, by estimation, is 55 to 60%. The  left ventricle has normal function. The left  ventricle has no regional  wall motion abnormalities. Left ventricular diastolic parameters are  consistent with Grade I diastolic  dysfunction (impaired relaxation).   2. Right ventricular systolic function is normal. The right ventricular  size is mildly enlarged. Tricuspid regurgitation signal is inadequate for  assessing PA pressure.   3. The mitral valve is normal in structure. No evidence of mitral valve  regurgitation. No evidence of mitral stenosis.   4. The aortic valve is tricuspid. Aortic valve regurgitation is not  visualized. No aortic stenosis is present.   5. The inferior vena cava is normal in size with greater than 50%  respiratory variability, suggesting right atrial pressure of 3 mmHg.   FINDINGS   Left Ventricle: Left ventricular ejection fraction, by estimation, is 55  to 60%. The left ventricle has normal function. The left ventricle has no  regional wall motion abnormalities. The left ventricular internal cavity  size was normal in size. There is   no left ventricular hypertrophy. Left ventricular diastolic parameters  are consistent with Grade I diastolic dysfunction (impaired relaxation).   Right Ventricle: The right ventricular size is mildly enlarged. No  increase in right ventricular wall thickness. Right ventricular systolic  function is normal. Tricuspid regurgitation signal is inadequate for  assessing PA pressure.   Left Atrium: Left atrial size was normal in size.   Right Atrium: Right atrial size was normal in size.   Pericardium: There is no evidence of pericardial effusion.   Mitral Valve: The mitral valve is normal in structure. No evidence of  mitral valve regurgitation. No evidence of mitral valve stenosis.   Tricuspid Valve: The tricuspid valve is normal in structure. Tricuspid  valve regurgitation is not demonstrated.   Aortic Valve: The aortic valve is tricuspid. Aortic valve regurgitation is  not visualized. No aortic stenosis is  present. Aortic valve mean gradient  measures 2.0 mmHg. Aortic valve peak  gradient measures 4.6 mmHg. Aortic  valve area, by VTI measures 2.42  cm.   Pulmonic Valve: The pulmonic valve was normal in structure. Pulmonic valve  regurgitation is trivial.   Aorta: The aortic root is normal in size and structure.   Venous: The inferior vena cava is normal in size with greater than 50%  respiratory variability, suggesting right atrial pressure of 3 mmHg.   IAS/Shunts: No atrial level shunt detected by color flow Doppler.   Cardiac catheterization 04/26/2023  Mild non-obstructive disease in the proximal RCA and in the proximal LAD.  LVEDP=17 mmHg   Recommendations: medical management of mild CAD. Explore other causes of chest pain.    Diagnostic Dominance: Right  Intervention       Assessment & Plan   1.  ***   Thomasene Ripple. Cebastian Neis NP-C     05/17/2023, 4:01 PM [facility] [address] Office [phone] Fax [phone]    I spent***minutes examining this patient, reviewing medications, and using patient centered shared decision making involving her cardiac care.   I spent greater than 20 minutes reviewing her past medical history,  medications, and prior cardiac tests.

## 2023-05-20 ENCOUNTER — Ambulatory Visit: Payer: Medicare HMO | Admitting: General Practice

## 2023-06-27 ENCOUNTER — Encounter: Payer: Self-pay | Admitting: Emergency Medicine

## 2023-06-27 ENCOUNTER — Ambulatory Visit
Admission: EM | Admit: 2023-06-27 | Discharge: 2023-06-27 | Disposition: A | Discharge status: Home / Self Care | Payer: Medicare HMO | Attending: Internal Medicine | Admitting: Internal Medicine

## 2023-06-27 DIAGNOSIS — J019 Acute sinusitis, unspecified: Secondary | ICD-10-CM

## 2023-06-27 LAB — POCT RAPID STREP A (OFFICE): Rapid Strep A Screen: NEGATIVE

## 2023-06-27 MED ORDER — FLUTICASONE PROPIONATE 50 MCG/ACT NA SUSP: 1.0000 | Freq: Every day | NASAL | 0 refills | Status: AC

## 2023-06-27 MED ORDER — AMOXICILLIN-POT CLAVULANATE 875-125 MG PO TABS: 1.0000 | ORAL_TABLET | Freq: Two times a day (BID) | ORAL | 0 refills | Status: DC

## 2023-06-27 NOTE — ED Provider Notes (Signed)
[facility]    CSN: [id number] Arrival date & time: 06/27/23  0802      History   Chief Complaint Chief Complaint  Patient presents with   Sore Throat    HPI Jill Anderson is a 58 y.o. female.   Patient here today for evaluation of left sided ear pain, sore throat and dental pain that started about 3 days ago. She has had other URI symptoms for 3-4 days prior to this but states most of these have resolved. She reports swallowing worsens pain. She has taken OTC meds without resolution of pain.   The history is provided by the patient.  Sore Throat Pertinent negatives include no abdominal pain and no shortness of breath.    Past Medical History:  Diagnosis Date   Borderline diabetic    Chronic back pain    Diabetes mellitus without complication [facility])    Hypercholesteremia     Patient Active Problem List   Diagnosis Date Noted   Coronary artery disease- Mild RCA and LAD disease 05/05/2023   Family history of heart disease 04/24/2023   Palpitations 04/24/2023   Overweight 04/24/2023   Plantar fasciitis of left foot 11/02/2022   Hepatic steatosis 10/14/2021   Aortic atherosclerosis (HCC) 10/14/2021   Umbilical hernia 10/14/2021   Hemorrhoids 08/06/2021   Allergic rhinitis 08/06/2021   Myalgia due to statin 12/12/2018   Vitamin D deficiency 09/21/2017   Insomnia 05/14/2017   Menopausal symptoms 01/04/2017   Urticaria 12/14/2016   Pain in both hands 12/14/2016   Essential hypertension 10/27/2016   Bilateral anterior knee pain 03/20/2016   DDD (degenerative disc disease), lumbosacral 11/15/2015   Low back pain radiating to both legs 11/15/2015   Left ureteral stone 10/27/2015   New onset of headaches after age 72 10/07/2015   Hyperlipidemia 07/15/2015   Type 2 diabetes mellitus (HCC) 03/04/2015   Chronic neck pain 09/11/2014   Cervical disc disorder with radiculopathy of cervical region 09/11/2014   Chronic radicular low back pain 09/11/2014     Past Surgical History:  Procedure Laterality Date   ABDOMINAL HYSTERECTOMY     ABDOMINOPLASTY/PANNICULECTOMY WITH LIPOSUCTION  06/26/2022   Performed in [employer] [facility]   BACK SURGERY     CYSTOSCOPY WITH RETROGRADE PYELOGRAM, URETEROSCOPY AND STENT PLACEMENT Left 10/28/2015   Procedure: CYSTOSCOPY WITH RETROGRADE PYELOGRAM, URETEROSCOPY AND STENT PLACEMENT, AND LASER;  Surgeon: Bjorn Pippin, MD;  Location: [facility];  Service: Urology;  Laterality: Left;   LEFT HEART CATH AND CORONARY ANGIOGRAPHY N/A 04/26/2023   Procedure: LEFT HEART CATH AND CORONARY ANGIOGRAPHY;  Surgeon: Kathleene Hazel, MD;  Location: [facility];  Service: Cardiovascular;  Laterality: N/A;    OB History   No obstetric history on file.      Home Medications    Prior to Admission medications   Medication Sig Start Date End Date Taking? Authorizing Provider  acetaminophen-codeine (TYLENOL #3) 300-30 MG tablet TAKE 1 TABLET BY MOUTH EVERY 8 HOURS AS NEEDED FOR MODERATE PAIN 02/12/23  Yes Loyola Mast, MD  amoxicillin-clavulanate (AUGMENTIN) 875-125 MG tablet Take 1 tablet by mouth every 12 (twelve) hours. 06/27/23  Yes Tomi Bamberger, PA-C  Blood Glucose Monitoring Suppl DEVI 1 each by Does not apply route in the morning, at noon, and at bedtime. May substitute to any manufacturer covered by patient's insurance. 04/26/23  Yes Rai, Ripudeep K, MD  cyclobenzaprine (FLEXERIL) 5 MG tablet Take 1 tablet (5 mg total) by mouth 2 (two) times daily. 04/26/23  Yes Rai, Ripudeep K, MD  fluticasone (FLONASE) 50 MCG/ACT nasal spray Place 1 spray into both nostrils daily. 06/27/23  Yes Tomi Bamberger, PA-C  Glucose Blood (BLOOD GLUCOSE TEST STRIPS) STRP 1 each by In Vitro route in the morning, at noon, and at bedtime. May substitute to any manufacturer covered by patient's insurance. 04/26/23 07/25/23 Yes Rai, Ripudeep K, MD  sitaGLIPtin (JANUVIA) 25 MG tablet Take 1 tablet (25 mg total) by mouth daily. 04/26/23   Yes Rai, Ripudeep K, MD  aspirin EC 81 MG tablet Take 1 tablet (81 mg total) by mouth daily. Patient taking differently: Take 81 mg by mouth every evening. 09/29/16   Funches, Gerilyn Nestle, MD  ezetimibe (ZETIA) 10 MG tablet Take 1 tablet (10 mg total) by mouth daily. Patient taking differently: Take 10 mg by mouth every evening. 11/02/22 10/28/23  Loyola Mast, MD  nitroGLYCERIN (NITROSTAT) 0.4 MG SL tablet Place 1 tablet (0.4 mg total) under the tongue every 5 (five) minutes x 3 doses as needed for chest pain. 04/26/23   Rai, Ripudeep K, MD  ondansetron (ZOFRAN) 4 MG tablet Take 1 tablet (4 mg total) by mouth every 8 (eight) hours as needed for nausea or vomiting. 04/26/23   Rai, Delene Ruffini, MD  pantoprazole (PROTONIX) 40 MG tablet Take Protonix 40 mg twice a day for 2 weeks, then once daily daily thereafter 04/26/23   Rai, Delene Ruffini, MD  traZODone (DESYREL) 50 MG tablet Take 1 tablet (50 mg total) by mouth at bedtime as needed for sleep. 04/26/23   Cathren Harsh, MD    Family History Family History  Problem Relation Age of Onset   Diabetes Mother    Hypertension Mother    Heart disease Mother        based at age 57 of MI   Diabetes Father    Hypertension Father    Heart disease Father    Cancer Maternal Aunt        liver   Colon cancer Neg Hx    Esophageal cancer Neg Hx    Stomach cancer Neg Hx     Social History Social History   Tobacco Use   Smoking status: Never    Passive exposure: Never   Smokeless tobacco: Never  Vaping Use   Vaping status: Never Used  Substance Use Topics   Alcohol use: No   Drug use: Never     Allergies   Lipitor [atorvastatin] and Glucotrol [glipizide]   Review of Systems Review of Systems  Constitutional:  Negative for chills and fever.  HENT:  Positive for congestion, ear pain, sinus pressure and sore throat.   Eyes:  Negative for discharge and redness.  Respiratory:  Negative for cough, shortness of breath and wheezing.    Gastrointestinal:  Negative for abdominal pain, diarrhea, nausea and vomiting.     Physical Exam Triage Vital Signs ED Triage Vitals  Encounter Vitals Group     BP 06/27/23 0827 (!) 145/79     Systolic BP Percentile --      Diastolic BP Percentile --      Pulse Rate 06/27/23 0827 67     Resp 06/27/23 0827 18     Temp 06/27/23 0827 98 F (36.7 C)     Temp Source 06/27/23 0827 Oral     SpO2 --      Weight 06/27/23 0824 148 lb 13 oz (67.5 kg)     Height 06/27/23 0824 5\' 2"  (1.575 m)  Head Circumference --      Peak Flow --      Pain Score 06/27/23 0823 7     Pain Loc --      Pain Education --      Exclude from Growth Chart --    No data found.  Updated Vital Signs BP (!) 145/79 (BP Location: Left Arm)   Pulse 67   Temp 98 F (36.7 C) (Oral)   Resp 18   Ht 5\' 2"  (1.575 m)   Wt 148 lb 13 oz (67.5 kg)   BMI 27.22 kg/m       Physical Exam Vitals and nursing note reviewed.  Constitutional:      General: She is not in acute distress.    Appearance: Normal appearance. She is not ill-appearing.  HENT:     Head: Normocephalic and atraumatic.     Right Ear: Tympanic membrane normal.     Ears:     Comments: Left TM retracted    Nose: Congestion present.     Mouth/Throat:     Mouth: Mucous membranes are moist.     Pharynx: No oropharyngeal exudate or posterior oropharyngeal erythema.  Eyes:     Conjunctiva/sclera: Conjunctivae normal.  Cardiovascular:     Rate and Rhythm: Normal rate and regular rhythm.     Heart sounds: Normal heart sounds. No murmur heard. Pulmonary:     Effort: Pulmonary effort is normal. No respiratory distress.     Breath sounds: Normal breath sounds. No wheezing, rhonchi or rales.  Skin:    General: Skin is warm and dry.  Neurological:     Mental Status: She is alert.  Psychiatric:        Mood and Affect: Mood normal.        Thought Content: Thought content normal.      UC Treatments / Results  Labs (all labs ordered are listed,  but only abnormal results are displayed) Labs Reviewed  POCT RAPID STREP A (OFFICE) - Normal    EKG   Radiology No results found.  Procedures Procedures (including critical care time)  Medications Ordered in UC Medications - No data to display  Initial Impression / Assessment and Plan / UC Course  I have reviewed the triage vital signs and the nursing notes.  Pertinent labs & imaging results that were available during my care of the patient were reviewed by me and considered in my medical decision making (see chart for details).   Will treat to cover sinusitis given dental pain and worsening symptoms after recent UTI. Flonase also prescribed for coverage of  ETD. Advised follow up if no gradual improvement or with any further concerns.   Final Clinical Impressions(s) / UC Diagnoses   Final diagnoses:  Acute non-recurrent sinusitis, unspecified location   Discharge Instructions   None    ED Prescriptions     Medication Sig Dispense Auth. Provider   amoxicillin-clavulanate (AUGMENTIN) 875-125 MG tablet Take 1 tablet by mouth every 12 (twelve) hours. 14 tablet Erma Pinto F, PA-C   fluticasone [facility]) 50 MCG/ACT nasal spray Place 1 spray into both nostrils daily. 15.8 mL Tomi Bamberger, PA-C      PDMP not reviewed this encounter.   Tomi Bamberger, PA-C 06/27/23 [phone]

## 2023-06-27 NOTE — ED Triage Notes (Signed)
Pt says she originally had body aches and chills about 4 days ago but then that went away. Now she has pain in her throat when swallowing on the left side. It hurts in her ear when swallowing. Symptoms onset 3 days ago.

## 2023-07-01 ENCOUNTER — Other Ambulatory Visit: Payer: Self-pay | Admitting: Family Medicine

## 2023-07-01 DIAGNOSIS — G8929 Other chronic pain: Secondary | ICD-10-CM

## 2023-07-01 DIAGNOSIS — M5010 Cervical disc disorder with radiculopathy, unspecified cervical region: Secondary | ICD-10-CM

## 2023-07-29 ENCOUNTER — Ambulatory Visit: Payer: Self-pay | Admitting: Family Medicine

## 2023-07-29 ENCOUNTER — Encounter: Payer: Self-pay | Admitting: Family Medicine

## 2023-07-29 ENCOUNTER — Ambulatory Visit (INDEPENDENT_AMBULATORY_CARE_PROVIDER_SITE_OTHER): Payer: Medicare HMO | Admitting: Family Medicine

## 2023-07-29 VITALS — BP 122/70 | HR 67 | Temp 97.7°F | Resp 18 | Wt 159.8 lb

## 2023-07-29 DIAGNOSIS — N390 Urinary tract infection, site not specified: Secondary | ICD-10-CM

## 2023-07-29 DIAGNOSIS — R319 Hematuria, unspecified: Secondary | ICD-10-CM | POA: Diagnosis not present

## 2023-07-29 DIAGNOSIS — R339 Retention of urine, unspecified: Secondary | ICD-10-CM | POA: Diagnosis not present

## 2023-07-29 LAB — URINALYSIS, ROUTINE W REFLEX MICROSCOPIC
Bilirubin Urine: NEGATIVE
Ketones, ur: NEGATIVE
Nitrite: POSITIVE — AB
Specific Gravity, Urine: 1.025 (ref 1.000–1.030)
Total Protein, Urine: NEGATIVE
Urine Glucose: NEGATIVE
Urobilinogen, UA: 1 (ref 0.0–1.0)
pH: 6.5 (ref 5.0–8.0)

## 2023-07-29 LAB — POC URINALSYSI DIPSTICK (AUTOMATED)
Bilirubin, UA: NEGATIVE
Glucose, UA: NEGATIVE
Ketones, UA: NEGATIVE
Nitrite, UA: NEGATIVE
Protein, UA: NEGATIVE
Spec Grav, UA: 1.025 (ref 1.010–1.025)
Urobilinogen, UA: 0.2 U/dL
pH, UA: 6 (ref 5.0–8.0)

## 2023-07-29 MED ORDER — SULFAMETHOXAZOLE-TRIMETHOPRIM 800-160 MG PO TABS: 1.0000 | ORAL_TABLET | Freq: Two times a day (BID) | ORAL | 0 refills | Status: AC

## 2023-07-29 NOTE — Telephone Encounter (Signed)
 Red Word that prompted transfer to Nurse Triage: Patient thinks she has a UTI, symptoms started two days ago, pain in abdomen and when urinating and odor smells, blood in urine, no fever, chills, vomiting, or nausea. Please advise [phone], patient wants to be seen.      Chief Complaint: Urinary pain, straining, low abdominal pain, blood in urine. Symptoms: Above Frequency: 2 days Pertinent Negatives: Patient denies fever Disposition: [] ED /[] Urgent Care (no appt availability in office) / [x] Appointment(In office/virtual)/ []  [place] [facility]/ [] Home Care/ [] Refused Recommended Disposition /[] [place] [facility]/ []  Follow-up with PCP Additional Notes: Agrees with appointment.  Reason for Disposition  Urinating more frequently than usual (i.e., frequency)  Answer Assessment - Initial Assessment Questions 1. SYMPTOM: "What's the main symptom you're concerned about?" (e.g., frequency, incontinence)     Abdominal pain, odor to urine, straining 2. ONSET: "When did the    start?"     2 days ago 3. PAIN: "Is there any pain?" If Yes, ask: "How bad is it?" (Scale: 1-10; mild, moderate, severe)     5 4. CAUSE: "What do you think is causing the symptoms?"     UTI 5. OTHER SYMPTOMS: "Do you have any other symptoms?" (e.g., blood in urine, fever, flank pain, pain with urination)     Blood 6. PREGNANCY: "Is there any chance you are pregnant?" "When was your last menstrual period?"     No  Protocols used: Urinary Symptoms-A-AH

## 2023-07-29 NOTE — Progress Notes (Signed)
 Established Patient Office Visit   Subjective:  Patient ID: Jill Anderson, female    DOB: [birth year]  Age: 58 y.o. MRN: [id number]  Chief Complaint  Patient presents with   Acute Visit    Pt C/O of UTI symptoms for 2 days with pelvis pressure and mild blood present with decrease in bladder emptiness; Colonoscopy and Prevnar 20 vaccine are due     HPI Encounter Diagnoses  Name Primary?   Urinary tract infection with hematuria, site unspecified Yes   Incomplete emptying of bladder    Hematuria, unspecified type    2-day history of suprapubic pressure with hematuria and incomplete emptying.  Denies dysuria or frequency.  No nausea or fever or chills.  No back pain.  Status post complete hysterectomy some years ago.   Review of Systems  Constitutional: Negative.   HENT: Negative.    Eyes:  Negative for blurred vision, discharge and redness.  Respiratory: Negative.    Cardiovascular: Negative.   Gastrointestinal:  Negative for abdominal pain, nausea and vomiting.  Genitourinary:  Positive for hematuria. Negative for dysuria, frequency and urgency.  Musculoskeletal: Negative.  Negative for myalgias.  Skin:  Negative for rash.  Neurological:  Negative for tingling, loss of consciousness and weakness.  Endo/Heme/Allergies:  Negative for polydipsia.     Current Outpatient Medications:    acetaminophen-codeine (TYLENOL #3) 300-30 MG tablet, TAKE 1 TABLET BY MOUTH EVERY 8 HOURS AS NEEDED FOR MODERATE PAIN, Disp: 60 tablet, Rfl: 0   amoxicillin-clavulanate (AUGMENTIN) 875-125 MG tablet, Take 1 tablet by mouth every 12 (twelve) hours., Disp: 14 tablet, Rfl: 0   aspirin EC 81 MG tablet, Take 1 tablet (81 mg total) by mouth daily. (Patient taking differently: Take 81 mg by mouth every evening.), Disp: 30 tablet, Rfl: 11   Blood Glucose Monitoring Suppl DEVI, 1 each by Does not apply route in the morning, at noon, and at bedtime. May substitute to any manufacturer covered by patient's  insurance., Disp: 1 each, Rfl: 0   cyclobenzaprine (FLEXERIL) 5 MG tablet, Take 1 tablet (5 mg total) by mouth 2 (two) times daily., Disp: 30 tablet, Rfl: 0   ezetimibe (ZETIA) 10 MG tablet, Take 1 tablet (10 mg total) by mouth daily. (Patient taking differently: Take 10 mg by mouth every evening.), Disp: 90 tablet, Rfl: 3   fluticasone (FLONASE) 50 MCG/ACT nasal spray, Place 1 spray into both nostrils daily., Disp: 15.8 mL, Rfl: 0   nitroGLYCERIN (NITROSTAT) 0.4 MG SL tablet, Place 1 tablet (0.4 mg total) under the tongue every 5 (five) minutes x 3 doses as needed for chest pain., Disp: 30 tablet, Rfl: 12   ondansetron (ZOFRAN) 4 MG tablet, Take 1 tablet (4 mg total) by mouth every 8 (eight) hours as needed for nausea or vomiting., Disp: 30 tablet, Rfl: 0   pantoprazole (PROTONIX) 40 MG tablet, Take Protonix 40 mg twice a day for 2 weeks, then once daily daily thereafter, Disp: 60 tablet, Rfl: 3   sitaGLIPtin (JANUVIA) 25 MG tablet, Take 1 tablet (25 mg total) by mouth daily., Disp: 30 tablet, Rfl: 3   sulfamethoxazole-trimethoprim (BACTRIM DS) 800-160 MG tablet, Take 1 tablet by mouth 2 (two) times daily for 7 days., Disp: 14 tablet, Rfl: 0   traZODone (DESYREL) 50 MG tablet, Take 1 tablet (50 mg total) by mouth at bedtime as needed for sleep., Disp: 30 tablet, Rfl: 0   Objective:     BP 122/70 (BP Location: Left Arm, Patient Position: Sitting, Cuff Size: Large)  Pulse 67   Temp 97.7 F (36.5 C) (Temporal)   Resp 18   Wt 159 lb 12.8 oz (72.5 kg)   SpO2 97%   BMI 29.23 kg/m    Physical Exam Constitutional:      General: She is not in acute distress.    Appearance: Normal appearance. She is not ill-appearing, toxic-appearing or diaphoretic.  HENT:     Head: Normocephalic and atraumatic.     Right Ear: External ear normal.     Left Ear: External ear normal.  Eyes:     General: No scleral icterus.       Right eye: No discharge.        Left eye: No discharge.     Extraocular  Movements: Extraocular movements intact.     Conjunctiva/sclera: Conjunctivae normal.  Cardiovascular:     Rate and Rhythm: Normal rate and regular rhythm.  Pulmonary:     Effort: Pulmonary effort is normal. No respiratory distress.     Breath sounds: No wheezing, rhonchi or rales.  Abdominal:     Tenderness: There is no right CVA tenderness or left CVA tenderness.  Skin:    General: Skin is warm and dry.  Neurological:     Mental Status: She is alert and oriented to person, place, and time.  Psychiatric:        Mood and Affect: Mood normal.        Behavior: Behavior normal.      Results for orders placed or performed in visit on 07/29/23  POCT Urinalysis Dipstick (Automated)  Result Value Ref Range   Color, UA YELLOW    Clarity, UA CLOUDY    Glucose, UA Negative Negative   Bilirubin, UA NEGATIVE    Ketones, UA NEGATIVE    Spec Grav, UA 1.025 1.010 - 1.025   Blood, UA 2+    pH, UA 6.0 5.0 - 8.0   Protein, UA Negative Negative   Urobilinogen, UA 0.2 0.2 or 1.0 E.U./dL   Nitrite, UA NEGATIVE    Leukocytes, UA Moderate (2+) (A) Negative      The 10-year ASCVD risk score (Arnett DK, et al., 2019) is: 7.1%    Assessment & Plan:   Urinary tract infection with hematuria, site unspecified  Incomplete emptying of bladder -     POCT Urinalysis Dipstick (Automated) -     Urinalysis, Routine w reflex microscopic -     Urine Culture -     Sulfamethoxazole-Trimethoprim; Take 1 tablet by mouth 2 (two) times daily for 7 days.  Dispense: 14 tablet; Refill: 0  Hematuria, unspecified type -     POCT Urinalysis Dipstick (Automated) -     Urinalysis, Routine w reflex microscopic -     Urine Culture -     Sulfamethoxazole-Trimethoprim; Take 1 tablet by mouth 2 (two) times daily for 7 days.  Dispense: 14 tablet; Refill: 0    Return if symptoms worsen or fail to improve.    Mliss Sax, MD

## 2023-08-01 LAB — URINE CULTURE
MICRO NUMBER:: 16138100
SPECIMEN QUALITY:: ADEQUATE

## 2023-08-02 ENCOUNTER — Ambulatory Visit: Payer: Self-pay | Admitting: Family Medicine

## 2023-08-02 MED ORDER — NITROFURANTOIN MONOHYD MACRO 100 MG PO CAPS: 100.0000 mg | ORAL_CAPSULE | Freq: Two times a day (BID) | ORAL | 0 refills | Status: AC

## 2023-08-02 NOTE — Telephone Encounter (Signed)
 Copied From CRM [id number]. Reason for Triage: Patient experiencing UTI symptoms, states she's been on antibiotics since Friday 2/28.On the second day of taking antibiotic, pushing and pressure urinating pain has gone away. States she's been drinking a lot of water and cranberry juice but the pain has came back.

## 2023-08-02 NOTE — Telephone Encounter (Signed)
 Noted. Patient advised to seek care at an urgent care.

## 2023-08-02 NOTE — Addendum Note (Signed)
 Addended by: Andrez Grime on: 08/02/2023 08:20 AM   Modules accepted: Orders

## 2023-08-02 NOTE — Telephone Encounter (Signed)
  Chief Complaint: Back Pain and Chills while on antibiotic for UTI Symptoms: back pain, chills, dx with UTI on 2/27 Frequency: started yesterday Pertinent Negatives: Patient denies CP, SOB, fever Disposition: [] ED /[x] Urgent Care (no appt availability in office) / [] Appointment(In office/virtual)/ []  [place] [facility]/ [] Home Care/ [] Refused Recommended Disposition /[] [place] [facility]/ []  Follow-up with PCP Additional Notes: patient called with concerns for back pain and chills after being diagnosed with UTI. Patient is currently on Bactrim. Patient was notified by provider that her urine culture tested positive for two different strains of bacteria. Per note in chart and per patient report, patient was told she didn't need another antibiotic but she was notified of a new prescription for another antibiotic. Patient states initially symptoms went away after she started Bactrim but now she has back pain and chills. Per protocol, the recommendation is to be seen within 4 hours. No appointments available at PCP office. Patient is recommended for Urgent Care. Patient verbalized understanding and plans to get checked out. Patient did request to make a follow up appointment with her PCP which was made. All questions answered.   Reason for Disposition  Side (flank) or lower back pain present  Answer Assessment - Initial Assessment Questions 1. SEVERITY: "How bad is the pain?"  (e.g., Scale 1-10; mild, moderate, or severe)   - MILD (1-3): complains slightly about urination hurting   - MODERATE (4-7): interferes with normal activities     - SEVERE (8-10): excruciating, unwilling or unable to urinate because of the pain      6 2. FREQUENCY: "How many times have you had painful urination today?"      3 3. PATTERN: "Is pain present every time you urinate or just sometimes?"      Just sometimes 4. ONSET: "When did the painful urination start?"      Started a couple of days ago 5. FEVER: "Do  you have a fever?" If Yes, ask: "What is your temperature, how was it measured, and when did it start?"     No 6. PAST UTI: "Have you had a urine infection before?" If Yes, ask: "When was the last time?" and "What happened that time?"      No 7. CAUSE: "What do you think is causing the painful urination?"  (e.g., UTI, scratch, Herpes sore)     UTI 8. OTHER SYMPTOMS: "Do you have any other symptoms?" (e.g., blood in urine, flank pain, genital sores, urgency, vaginal discharge)     Chills, back pain  Protocols used: Urination Pain - Female-A-AH

## 2023-08-11 ENCOUNTER — Ambulatory Visit: Admitting: Family Medicine

## 2023-08-11 VITALS — BP 132/76 | HR 66 | Temp 98.0°F | Ht 62.0 in | Wt 161.2 lb

## 2023-08-11 DIAGNOSIS — M791 Myalgia, unspecified site: Secondary | ICD-10-CM

## 2023-08-11 DIAGNOSIS — I10 Essential (primary) hypertension: Secondary | ICD-10-CM | POA: Diagnosis not present

## 2023-08-11 DIAGNOSIS — I2510 Atherosclerotic heart disease of native coronary artery without angina pectoris: Secondary | ICD-10-CM | POA: Diagnosis not present

## 2023-08-11 DIAGNOSIS — E782 Mixed hyperlipidemia: Secondary | ICD-10-CM

## 2023-08-11 DIAGNOSIS — Z1211 Encounter for screening for malignant neoplasm of colon: Secondary | ICD-10-CM

## 2023-08-11 DIAGNOSIS — E119 Type 2 diabetes mellitus without complications: Secondary | ICD-10-CM | POA: Diagnosis not present

## 2023-08-11 DIAGNOSIS — Z7984 Long term (current) use of oral hypoglycemic drugs: Secondary | ICD-10-CM

## 2023-08-11 LAB — GLUCOSE, RANDOM: Glucose, Bld: 107 mg/dL — ABNORMAL HIGH (ref 70–99)

## 2023-08-11 LAB — HEMOGLOBIN A1C: Hgb A1c MFr Bld: 6.6 % — ABNORMAL HIGH (ref 4.6–6.5)

## 2023-08-11 NOTE — Assessment & Plan Note (Signed)
 Diabetes has been in good control. I will check an A1c today. Continue sitagliptin 25 mg daily.

## 2023-08-11 NOTE — Assessment & Plan Note (Signed)
 Mild disease. We will continue to focus on medical management.

## 2023-08-11 NOTE — Progress Notes (Signed)
 [facility] [address] [place] [address] Dept: [phone] Dept Fax: [phone]  Chronic Care Office Visit  Subjective:    Patient ID: Jill Anderson, female    DOB: [birth year], 58 y.o..   MRN: [id number]  Chief Complaint  Patient presents with   Follow-up    2 week f/u UTI.  Feeling better.   History of Present Illness:  Patient is in today for reassessment of chronic medical issues.  [name] was seen recently with a UTI. She notes that she had never had one previously. She is doing much better at this point, with resolution of her symptoms.  [name] has a history of Type 2 diabetes. She is managed on sitagliptin (Januvia) 25 mg daily. She notes her fasting blood sugars are consistently under 120.   [name]  has a history of hypertension. She has been managing this with diet and exercise. She admits she has been off of her exercise routine this winter.   [name] has a history of hyperlipidemia. She has been unable to tolerate statins due to myalgias. She is currently on ezetimibe 10 mg daily under the direction of Dr. Rennis Golden.  Past Medical History: Patient Active Problem List   Diagnosis Date Noted   Coronary artery disease- Mild RCA and LAD disease 05/05/2023   Family history of heart disease 04/24/2023   Palpitations 04/24/2023   Overweight 04/24/2023   Plantar fasciitis of left foot 11/02/2022   Hepatic steatosis 10/14/2021   Aortic atherosclerosis (HCC) 10/14/2021   Umbilical hernia 10/14/2021   Hemorrhoids 08/06/2021   Allergic rhinitis 08/06/2021   Myalgia due to statin 12/12/2018   Vitamin D deficiency 09/21/2017   Insomnia 05/14/2017   Menopausal symptoms 01/04/2017   Urticaria 12/14/2016   Pain in both hands 12/14/2016   Essential hypertension 10/27/2016   Bilateral anterior knee pain 03/20/2016   DDD (degenerative disc disease), lumbosacral 11/15/2015   Low back pain radiating to both  legs 11/15/2015   Left ureteral stone 10/27/2015   New onset of headaches after age 58 10/07/2015   Hyperlipidemia 07/15/2015   Type 2 diabetes mellitus (HCC) 03/04/2015   Chronic neck pain 09/11/2014   Cervical disc disorder with radiculopathy of cervical region 09/11/2014   Chronic radicular low back pain 09/11/2014   Past Surgical History:  Procedure Laterality Date   ABDOMINAL HYSTERECTOMY     ABDOMINOPLASTY/PANNICULECTOMY WITH LIPOSUCTION  06/26/2022   Performed in [employer] [facility]   BACK SURGERY     CYSTOSCOPY WITH RETROGRADE PYELOGRAM, URETEROSCOPY AND STENT PLACEMENT Left 10/28/2015   Procedure: CYSTOSCOPY WITH RETROGRADE PYELOGRAM, URETEROSCOPY AND STENT PLACEMENT, AND LASER;  Surgeon: Bjorn Pippin, MD;  Location: [facility];  Service: Urology;  Laterality: Left;   LEFT HEART CATH AND CORONARY ANGIOGRAPHY N/A 04/26/2023   Procedure: LEFT HEART CATH AND CORONARY ANGIOGRAPHY;  Surgeon: Kathleene Hazel, MD;  Location: [facility];  Service: Cardiovascular;  Laterality: N/A;   Family History  Problem Relation Age of Onset   Diabetes Mother    Hypertension Mother    Heart disease Mother        based at age 98 of MI   Diabetes Father    Hypertension Father    Heart disease Father    Cancer Maternal Aunt        liver   Colon cancer Neg Hx    Esophageal cancer Neg Hx    Stomach cancer Neg Hx    Outpatient Medications Prior  to Visit  Medication Sig Dispense Refill   acetaminophen-codeine (TYLENOL #3) 300-30 MG tablet TAKE 1 TABLET BY MOUTH EVERY 8 HOURS AS NEEDED FOR MODERATE PAIN 60 tablet 0   aspirin EC 81 MG tablet Take 1 tablet (81 mg total) by mouth daily. (Patient taking differently: Take 81 mg by mouth every evening.) 30 tablet 11   Blood Glucose Monitoring Suppl DEVI 1 each by Does not apply route in the morning, at noon, and at bedtime. May substitute to any manufacturer covered by patient's insurance. 1 each 0   cyclobenzaprine (FLEXERIL) 5 MG tablet Take 1  tablet (5 mg total) by mouth 2 (two) times daily. 30 tablet 0   ezetimibe (ZETIA) 10 MG tablet Take 1 tablet (10 mg total) by mouth daily. (Patient taking differently: Take 10 mg by mouth every evening.) 90 tablet 3   fluticasone (FLONASE) 50 MCG/ACT nasal spray Place 1 spray into both nostrils daily. 15.8 mL 0   nitroGLYCERIN (NITROSTAT) 0.4 MG SL tablet Place 1 tablet (0.4 mg total) under the tongue every 5 (five) minutes x 3 doses as needed for chest pain. 30 tablet 12   ondansetron (ZOFRAN) 4 MG tablet Take 1 tablet (4 mg total) by mouth every 8 (eight) hours as needed for nausea or vomiting. 30 tablet 0   pantoprazole (PROTONIX) 40 MG tablet Take Protonix 40 mg twice a day for 2 weeks, then once daily daily thereafter 60 tablet 3   sitaGLIPtin (JANUVIA) 25 MG tablet Take 1 tablet (25 mg total) by mouth daily. 30 tablet 3   traZODone (DESYREL) 50 MG tablet Take 1 tablet (50 mg total) by mouth at bedtime as needed for sleep. 30 tablet 0   No facility-administered medications prior to visit.   Allergies  Allergen Reactions   Lipitor [Atorvastatin] Other (See Comments)    Myalgia    Glucotrol [Glipizide] Other (See Comments)    Burning in stomach   Objective:   Today's Vitals   08/11/23 1255  BP: 132/76  Pulse: 66  Temp: 98 F (36.7 C)  TempSrc: Temporal  SpO2: 99%  Weight: 161 lb 3.2 oz (73.1 kg)  Height: 5\' 2"  (1.575 m)   Body mass index is 29.48 kg/m.   General: Well developed, well nourished. No acute distress. Psych: Alert and oriented. Normal mood and affect.  Health Maintenance Due  Topic Date Due   Pneumococcal Vaccine 4-2 Years old (2 of 2 - PCV) 07/11/2016   Colonoscopy  06/02/2023     Assessment & Plan:   Problem List Items Addressed This Visit       Cardiovascular and Mediastinum   Coronary artery disease- Mild RCA and LAD disease   Mild disease. We will continue to focus on medical management.      Essential hypertension - Primary   Blood pressure  is acceptable. Continue to manage with diet and weight management.         Endocrine   Type 2 diabetes mellitus (HCC)   Diabetes has been in good control. I will check an A1c today. Continue sitagliptin 25 mg daily.      Relevant Orders   Glucose, random   Hemoglobin A1c     Other   Hyperlipidemia   Stable. Continue ezetimibe 10 mg daily.      Myalgia due to statin   Other Visit Diagnoses       Screening for colon cancer       Relevant Orders   Ambulatory referral to Gastroenterology  Return in about 3 months (around 11/11/2023) for Reassessment.   Loyola Mast, MD

## 2023-08-11 NOTE — Assessment & Plan Note (Signed)
Stable. Continue ezetimibe 10 mg daily.

## 2023-08-11 NOTE — Assessment & Plan Note (Signed)
 Blood pressure is acceptable. Continue to manage with diet and weight management.

## 2023-08-12 ENCOUNTER — Encounter: Payer: Self-pay | Admitting: Family Medicine

## 2023-09-08 ENCOUNTER — Ambulatory Visit: Payer: Self-pay

## 2023-09-08 ENCOUNTER — Ambulatory Visit (INDEPENDENT_AMBULATORY_CARE_PROVIDER_SITE_OTHER): Admitting: Internal Medicine

## 2023-09-08 ENCOUNTER — Encounter: Payer: Self-pay | Admitting: Internal Medicine

## 2023-09-08 VITALS — BP 124/78 | HR 70 | Temp 97.7°F | Ht 62.0 in | Wt 158.0 lb

## 2023-09-08 DIAGNOSIS — R1013 Epigastric pain: Secondary | ICD-10-CM | POA: Diagnosis not present

## 2023-09-08 DIAGNOSIS — J309 Allergic rhinitis, unspecified: Secondary | ICD-10-CM

## 2023-09-08 DIAGNOSIS — R42 Dizziness and giddiness: Secondary | ICD-10-CM | POA: Diagnosis not present

## 2023-09-08 DIAGNOSIS — A09 Infectious gastroenteritis and colitis, unspecified: Secondary | ICD-10-CM

## 2023-09-08 LAB — CBC WITH DIFFERENTIAL/PLATELET
Basophils Absolute: 0 10*3/uL (ref 0.0–0.1)
Basophils Relative: 0.5 % (ref 0.0–3.0)
Eosinophils Absolute: 0.2 10*3/uL (ref 0.0–0.7)
Eosinophils Relative: 3.5 % (ref 0.0–5.0)
HCT: 44.4 % (ref 36.0–46.0)
Hemoglobin: 15 g/dL (ref 12.0–15.0)
Lymphocytes Relative: 33.2 % (ref 12.0–46.0)
Lymphs Abs: 1.4 10*3/uL (ref 0.7–4.0)
MCHC: 33.8 g/dL (ref 30.0–36.0)
MCV: 92.1 fl (ref 78.0–100.0)
Monocytes Absolute: 0.7 10*3/uL (ref 0.1–1.0)
Monocytes Relative: 15.4 % — ABNORMAL HIGH (ref 3.0–12.0)
Neutro Abs: 2 10*3/uL (ref 1.4–7.7)
Neutrophils Relative %: 47.4 % (ref 43.0–77.0)
Platelets: 271 10*3/uL (ref 150.0–400.0)
RBC: 4.82 Mil/uL (ref 3.87–5.11)
RDW: 12.5 % (ref 11.5–15.5)
WBC: 4.3 10*3/uL (ref 4.0–10.5)

## 2023-09-08 LAB — BASIC METABOLIC PANEL WITH GFR
BUN: 11 mg/dL (ref 6–23)
CO2: 28 meq/L (ref 19–32)
Calcium: 9.7 mg/dL (ref 8.4–10.5)
Chloride: 101 meq/L (ref 96–112)
Creatinine, Ser: 0.79 mg/dL (ref 0.40–1.20)
GFR: 82.04 mL/min (ref >60.00)
Glucose, Bld: 103 mg/dL — ABNORMAL HIGH (ref 70–99)
Potassium: 4.2 meq/L (ref 3.5–5.1)
Sodium: 136 meq/L (ref 135–145)

## 2023-09-08 LAB — HEPATIC FUNCTION PANEL
ALT: 31 U/L (ref 0–35)
AST: 29 U/L (ref 0–37)
Albumin: 4.8 g/dL (ref 3.5–5.2)
Alkaline Phosphatase: 44 U/L (ref 39–117)
Bilirubin, Direct: 0.2 mg/dL (ref 0.0–0.3)
Total Bilirubin: 0.8 mg/dL (ref 0.2–1.2)
Total Protein: 8 g/dL (ref 6.0–8.3)

## 2023-09-08 LAB — LIPASE: Lipase: 40 U/L (ref 11.0–59.0)

## 2023-09-08 MED ORDER — MECLIZINE HCL 12.5 MG PO TABS: 12.5000 mg | ORAL_TABLET | Freq: Three times a day (TID) | ORAL | 1 refills | Status: AC | PRN

## 2023-09-08 MED ORDER — PREDNISONE 10 MG PO TABS: ORAL_TABLET | ORAL | 0 refills | Status: DC

## 2023-09-08 MED ORDER — DIPHENOXYLATE-ATROPINE 2.5-0.025 MG PO TABS: 1.0000 | ORAL_TABLET | Freq: Four times a day (QID) | ORAL | 0 refills | Status: AC | PRN

## 2023-09-08 MED ORDER — ONDANSETRON 4 MG PO TBDP: 4.0000 mg | ORAL_TABLET | Freq: Three times a day (TID) | ORAL | 1 refills | Status: AC | PRN

## 2023-09-08 NOTE — Patient Instructions (Addendum)
 Please take all new medication as prescribed- the nausea medication (zofran), diarrhea med (lomotil), meclizine as needed for vertigo, and prednisone for allergies  Please continue all other medications as before, and refills have been done if requested.  Please have the pharmacy call with any other refills you may need.  Please keep your appointments with your specialists as you may have planned  Please go to the LAB at the blood drawing area for the tests to be done - for the stool tests and labs  You will be contacted by phone if any changes need to be made immediately.  Otherwise, you will receive a letter about your results with an explanation, but please check with MyChart first.

## 2023-09-08 NOTE — Progress Notes (Signed)
 Patient ID: Jill Anderson, female   DOB: 07-24-1965, 58 y.o.   MRN: [id number]        Chief Complaint: follow up abd pain with n/v/d, vertigo, allergies       HPI:  Jill Anderson is a 58 y.o. female here with c/o 3 days onset midl to mod general abd pain with n/v/d - watery, no blood but without significant fever , rash or cough as well.  Did attend a social function but no one overly ill at that time prior to onset of symptoms.  Has been able to keep down some fluids, lost several lbs it seems, No hx of c diff but did have recent antibiotic courses x 2 for URI and UTI.  Also has mild intermittent vertigo with head moveement such as turning over in bed.  Does have several wks ongoing nasal allergy symptoms with clearish congestion, itch and sneezing, without fever, pain, ST, cough, swelling or wheezing.       Wt Readings from Last 3 Encounters:  09/08/23 158 lb (71.7 kg)  08/11/23 161 lb 3.2 oz (73.1 kg)  07/29/23 159 lb 12.8 oz (72.5 kg)   BP Readings from Last 3 Encounters:  09/08/23 124/78  08/11/23 132/76  07/29/23 122/70         Past Medical History:  Diagnosis Date   Borderline diabetic    Chronic back pain    Diabetes mellitus without complication (HCC)    Hypercholesteremia    Past Surgical History:  Procedure Laterality Date   ABDOMINAL HYSTERECTOMY     ABDOMINOPLASTY/PANNICULECTOMY WITH LIPOSUCTION  06/26/2022   Performed in [facility] [employer] [facility]   BACK SURGERY     CYSTOSCOPY WITH RETROGRADE PYELOGRAM, URETEROSCOPY AND STENT PLACEMENT Left 10/28/2015   Procedure: CYSTOSCOPY WITH RETROGRADE PYELOGRAM, URETEROSCOPY AND STENT PLACEMENT, AND LASER;  Surgeon: Homero Luster, MD;  Location: [facility];  Service: Urology;  Laterality: Left;   LEFT HEART CATH AND CORONARY ANGIOGRAPHY N/A 04/26/2023   Procedure: LEFT HEART CATH AND CORONARY ANGIOGRAPHY;  Surgeon: Odie Benne, MD;  Location: [facility];  Service: Cardiovascular;  Laterality: N/A;    reports that she has never  smoked. She has never been exposed to tobacco smoke. She has never used smokeless tobacco. She reports that she does not drink alcohol and does not use drugs. family history includes Cancer in her maternal aunt; Diabetes in her father and mother; Heart disease in her father and mother; Hypertension in her father and mother. Allergies  Allergen Reactions   Lipitor [Atorvastatin] Other (See Comments)    Myalgia    Glucotrol [Glipizide] Other (See Comments)    Burning in stomach   Current Outpatient Medications on File Prior to Visit  Medication Sig Dispense Refill   acetaminophen-codeine (TYLENOL #3) 300-30 MG tablet TAKE 1 TABLET BY MOUTH EVERY 8 HOURS AS NEEDED FOR MODERATE PAIN 60 tablet 0   aspirin EC 81 MG tablet Take 1 tablet (81 mg total) by mouth daily. (Patient taking differently: Take 81 mg by mouth every evening.) 30 tablet 11   Blood Glucose Monitoring Suppl DEVI 1 each by Does not apply route in the morning, at noon, and at bedtime. May substitute to any manufacturer covered by patient's insurance. 1 each 0   cyclobenzaprine (FLEXERIL) 5 MG tablet Take 1 tablet (5 mg total) by mouth 2 (two) times daily. 30 tablet 0   ezetimibe (ZETIA) 10 MG tablet Take 1 tablet (10 mg total) by mouth daily. (Patient taking differently: Take 10  mg by mouth every evening.) 90 tablet 3   fluticasone (FLONASE) 50 MCG/ACT nasal spray Place 1 spray into both nostrils daily. 15.8 mL 0   nitroGLYCERIN (NITROSTAT) 0.4 MG SL tablet Place 1 tablet (0.4 mg total) under the tongue every 5 (five) minutes x 3 doses as needed for chest pain. 30 tablet 12   ondansetron (ZOFRAN) 4 MG tablet Take 1 tablet (4 mg total) by mouth every 8 (eight) hours as needed for nausea or vomiting. 30 tablet 0   pantoprazole (PROTONIX) 40 MG tablet Take Protonix 40 mg twice a day for 2 weeks, then once daily daily thereafter 60 tablet 3   sitaGLIPtin (JANUVIA) 25 MG tablet Take 1 tablet (25 mg total) by mouth daily. 30 tablet 3    traZODone (DESYREL) 50 MG tablet Take 1 tablet (50 mg total) by mouth at bedtime as needed for sleep. 30 tablet 0   No current facility-administered medications on file prior to visit.        ROS:  All others reviewed and negative.  Objective        PE:  BP 124/78 (BP Location: Right Arm, Patient Position: Sitting, Cuff Size: Normal)   Pulse 70   Temp 97.7 F (36.5 C) (Oral)   Ht 5\' 2"  (1.575 m)   Wt 158 lb (71.7 kg)   SpO2 99%   BMI 28.90 kg/m                 Constitutional: Pt appears in NAD               HENT: Head: NCAT.                Right Ear: External ear normal.                 Left Ear: External ear normal. Bilat tm's with mild erythema.  Max sinus areas non tender.  Pharynx with mild erythema, no exudate               Eyes: . Pupils are equal, round, and reactive to light. Conjunctivae and EOM are normal               Nose: without d/c or deformity               Neck: Neck supple. Gross normal ROM               Cardiovascular: Normal rate and regular rhythm.                 Pulmonary/Chest: Effort normal and breath sounds without rales or wheezing.                Abd:  Soft, mild epigastric tender, ND, + BS, no organomegaly               Neurological: Pt is alert. At baseline orientation, motor grossly intact               Skin: Skin is warm. No rashes, no other new lesions, LE edema - none               Psychiatric: Pt behavior is normal without agitation   Micro: none  Cardiac tracings I have personally interpreted today:  none  Pertinent Radiological findings (summarize): none   Lab Results  Component Value Date   WBC 4.3 09/08/2023   HGB 15.0 09/08/2023   HCT 44.4 09/08/2023   PLT 271.0 09/08/2023   GLUCOSE 103 (  H) 09/08/2023   CHOL 192 04/26/2023   TRIG 175 (H) 04/26/2023   HDL 51 04/26/2023   LDLCALC 106 (H) 04/26/2023   ALT 31 09/08/2023   AST 29 09/08/2023   NA 136 09/08/2023   K 4.2 09/08/2023   CL 101 09/08/2023   CREATININE 0.79 09/08/2023    BUN 11 09/08/2023   CO2 28 09/08/2023   TSH 2.754 04/24/2023   HGBA1C 6.6 (H) 08/11/2023   MICROALBUR <0.7 11/02/2022   Assessment/Plan:  Jill Anderson is a 58 y.o. Other or two or more races [6] female with  has a past medical history of Borderline diabetic, Chronic back pain, Diabetes mellitus without complication (HCC), and Hypercholesteremia.  Allergic rhinitis Mild to mod, for prednisone taper,  to f/u any worsening symptoms or concerns  Diarrhea, infectious, adult For GI panel, lomotil prn  Epigastric pain Also for labs includinb cbc bmp  Vertigo Mild to mod, for meclizine prn,,  to f/u any worsening symptoms or concerns  Followup: Return if symptoms worsen or fail to improve.  Rosalia Colonel, MD 09/11/2023 4:43 PM [facility]

## 2023-09-08 NOTE — Telephone Encounter (Signed)
 Copied from CRM [id number]. Topic: Clinical - Red Word Triage >> Sep 08, 2023 10:16 AM Presley Raddle C wrote: Red Word that prompted transfer to Nurse Triage: dizziness, stomach & burning pain 8/10, vomitting  Chief Complaint: abd pain Symptoms: abd pain, n/v/d Frequency: since Monday Pertinent Negatives: Patient denies fever, blood in stool, cp, sob Disposition: [] ED /[] Urgent Care (no appt availability in office) / [x] Appointment(In office/virtual)/ []  [place] [facility]/ [] Home Care/ [] Refused Recommended Disposition /[] [facility]/ []  Follow-up with PCP Additional Notes: apt made for today; care advice given, denies questions; instructed to go to ER if becomes worse.   Reason for Disposition  [1] MILD-MODERATE pain AND [2] constant AND [3] present > 2 hours  Answer Assessment - Initial Assessment Questions 1. LOCATION: "Where does it hurt?"      upper 2. RADIATION: "Does the pain shoot anywhere else?" (e.g., chest, back)     chest 3. ONSET: "When did the pain begin?" (e.g., minutes, hours or days ago)      Monday 4. SUDDEN: "Gradual or sudden onset?"     suddenly 5. PATTERN "Does the pain come and go, or is it constant?"    - If it comes and goes: "How long does it last?" "Do you have pain now?"     (Note: Comes and goes means the pain is intermittent. It goes away completely between bouts.)    - If constant: "Is it getting better, staying the same, or getting worse?"      (Note: Constant means the pain never goes away completely; most serious pain is constant and gets worse.)      Comes and goes 6. SEVERITY: "How bad is the pain?"  (e.g., Scale 1-10; mild, moderate, or severe)    - MILD (1-3): Doesn't interfere with normal activities, abdomen soft and not tender to touch.     - MODERATE (4-7): Interferes with normal activities or awakens from sleep, abdomen tender to touch.     - SEVERE (8-10): Excruciating pain, doubled over, unable to do any normal activities.        5/10 7. RECURRENT SYMPTOM: "Have you ever had this type of stomach pain before?" If Yes, ask: "When was the last time?" and "What happened that time?"      denies 8. CAUSE: "What do you think is causing the stomach pain?"     unknown 9. RELIEVING/AGGRAVATING FACTORS: "What makes it better or worse?" (e.g., antacids, bending or twisting motion, bowel movement)     Ginger helped 10. OTHER SYMPTOMS: "Do you have any other symptoms?" (e.g., back pain, diarrhea, fever, urination pain, vomiting)       N/v/d 11. PREGNANCY: "Is there any chance you are pregnant?" "When was your last menstrual period?"       na  Protocols used: Abdominal Pain - [facility]

## 2023-09-09 ENCOUNTER — Encounter: Payer: Self-pay | Admitting: Internal Medicine

## 2023-09-09 DIAGNOSIS — A09 Infectious gastroenteritis and colitis, unspecified: Secondary | ICD-10-CM | POA: Diagnosis not present

## 2023-09-09 LAB — URINALYSIS, ROUTINE W REFLEX MICROSCOPIC
Bilirubin Urine: NEGATIVE
Hgb urine dipstick: NEGATIVE
Leukocytes,Ua: NEGATIVE
Nitrite: NEGATIVE
RBC / HPF: NONE SEEN (ref 0–?)
Specific Gravity, Urine: >=1.03 — AB (ref 1.000–1.030)
Total Protein, Urine: NEGATIVE
Urine Glucose: NEGATIVE
Urobilinogen, UA: 0.2 (ref 0.0–1.0)
pH: 6 (ref 5.0–8.0)

## 2023-09-09 NOTE — Progress Notes (Signed)
 The test results show that your current treatment is OK, as the tests are stable.  Please continue the same plan.  There is no other need for change of treatment or further evaluation based on these results, at this time.  thanks

## 2023-09-11 ENCOUNTER — Encounter: Payer: Self-pay | Admitting: Internal Medicine

## 2023-09-11 DIAGNOSIS — A09 Infectious gastroenteritis and colitis, unspecified: Secondary | ICD-10-CM | POA: Insufficient documentation

## 2023-09-11 DIAGNOSIS — R1013 Epigastric pain: Secondary | ICD-10-CM | POA: Insufficient documentation

## 2023-09-11 DIAGNOSIS — R42 Dizziness and giddiness: Secondary | ICD-10-CM | POA: Insufficient documentation

## 2023-09-11 NOTE — Assessment & Plan Note (Signed)
Mild to mod, for meclizine prn,,  to f/u any worsening symptoms or concerns

## 2023-09-11 NOTE — Assessment & Plan Note (Signed)
 For GI panel, lomotil prn

## 2023-09-11 NOTE — Assessment & Plan Note (Signed)
 Also for labs includinb cbc bmp

## 2023-09-11 NOTE — Assessment & Plan Note (Signed)
 Mild to mod, for prednisone taper, to f/u any worsening symptoms or concerns

## 2023-09-15 LAB — C. DIFFICILE GDH AND TOXIN A/B
GDH ANTIGEN: NOT DETECTED
TOXIN A AND B: NOT DETECTED

## 2023-09-15 LAB — GASTROINTESTINAL PATHOGEN PNL

## 2023-10-11 ENCOUNTER — Other Ambulatory Visit: Payer: Self-pay | Admitting: Family Medicine

## 2023-10-11 DIAGNOSIS — M5010 Cervical disc disorder with radiculopathy, unspecified cervical region: Secondary | ICD-10-CM

## 2023-10-11 DIAGNOSIS — G8929 Other chronic pain: Secondary | ICD-10-CM

## 2023-10-29 ENCOUNTER — Encounter: Payer: Self-pay | Admitting: Family Medicine

## 2023-11-12 ENCOUNTER — Encounter (HOSPITAL_COMMUNITY): Payer: Self-pay

## 2023-11-12 ENCOUNTER — Emergency Department (HOSPITAL_COMMUNITY): Admission: EM | Admit: 2023-11-12 | Discharge: 2023-11-12 | Disposition: A | Discharge status: Home / Self Care

## 2023-11-12 ENCOUNTER — Emergency Department (HOSPITAL_COMMUNITY)

## 2023-11-12 ENCOUNTER — Other Ambulatory Visit: Payer: Self-pay

## 2023-11-12 DIAGNOSIS — Z7982 Long term (current) use of aspirin: Secondary | ICD-10-CM | POA: Insufficient documentation

## 2023-11-12 DIAGNOSIS — R0781 Pleurodynia: Secondary | ICD-10-CM | POA: Insufficient documentation

## 2023-11-12 DIAGNOSIS — W01198A Fall on same level from slipping, tripping and stumbling with subsequent striking against other object, initial encounter: Secondary | ICD-10-CM | POA: Insufficient documentation

## 2023-11-12 DIAGNOSIS — Z7984 Long term (current) use of oral hypoglycemic drugs: Secondary | ICD-10-CM | POA: Diagnosis not present

## 2023-11-12 DIAGNOSIS — E119 Type 2 diabetes mellitus without complications: Secondary | ICD-10-CM | POA: Insufficient documentation

## 2023-11-12 DIAGNOSIS — W19XXXA Unspecified fall, initial encounter: Secondary | ICD-10-CM

## 2023-11-12 DIAGNOSIS — Z043 Encounter for examination and observation following other accident: Secondary | ICD-10-CM | POA: Diagnosis not present

## 2023-11-12 MED ORDER — KETOROLAC TROMETHAMINE 15 MG/ML IJ SOLN
15.0000 mg | Freq: Once | INTRAMUSCULAR | Status: AC
Start: 2023-11-12 — End: 2023-11-12
  Administered 2023-11-12: 15 mg via INTRAMUSCULAR
  Filled 2023-11-12: qty 1

## 2023-11-12 NOTE — ED Provider Notes (Signed)
 [place] [facility] Provider Note   CSN: [id number] Arrival date & time: 11/12/23  4098     Patient presents with: Alberteen Huge YASIRA ENGELSON is a 58 y.o. female with noncontributory past medical history presents with complaints of right rib pain.  Patient states she slipped on a rug and hit her side on the counter.  Denies any preceding dizziness or chest pain.  She did not hit her head.  She has no vomiting.  Pain is worse with respirations.    Fall   Past Medical History:  Diagnosis Date   Borderline diabetic    Chronic back pain    Diabetes mellitus without complication (HCC)    Hypercholesteremia    Past Surgical History:  Procedure Laterality Date   ABDOMINAL HYSTERECTOMY     ABDOMINOPLASTY/PANNICULECTOMY WITH LIPOSUCTION  06/26/2022   Performed in [facility]   BACK SURGERY     CYSTOSCOPY WITH RETROGRADE PYELOGRAM, URETEROSCOPY AND STENT PLACEMENT Left 10/28/2015   Procedure: CYSTOSCOPY WITH RETROGRADE PYELOGRAM, URETEROSCOPY AND STENT PLACEMENT, AND LASER;  Surgeon: Homero Luster, MD;  Location: [facility];  Service: Urology;  Laterality: Left;   LEFT HEART CATH AND CORONARY ANGIOGRAPHY N/A 04/26/2023   Procedure: LEFT HEART CATH AND CORONARY ANGIOGRAPHY;  Surgeon: Odie Benne, MD;  Location: [facility];  Service: Cardiovascular;  Laterality: N/A;       Prior to Admission medications   Medication Sig Start Date End Date Taking? Authorizing Provider  acetaminophen -codeine  (TYLENOL  #3) 300-30 MG tablet TAKE 1 TABLET BY MOUTH EVERY 8 HOURS AS NEEDED FOR MODERATE PAIN 10/11/23   Graig Lawyer, MD  aspirin  EC 81 MG tablet Take 1 tablet (81 mg total) by mouth daily. Patient taking differently: Take 81 mg by mouth every evening. 09/29/16   Funches, Josalyn, MD  Blood Glucose Monitoring Suppl DEVI 1 each by Does not apply route in the morning, at noon, and at bedtime. May substitute to any manufacturer covered by patient's  insurance. 04/26/23   Rai, Hurman Maiden, MD  cyclobenzaprine  (FLEXERIL ) 5 MG tablet Take 1 tablet (5 mg total) by mouth 2 (two) times daily. 04/26/23   Rai, Hurman Maiden, MD  diphenoxylate -atropine  (LOMOTIL ) 2.5-0.025 MG tablet Take 1 tablet by mouth 4 (four) times daily as needed for diarrhea or loose stools. 09/08/23   Roslyn Coombe, MD  ezetimibe  (ZETIA ) 10 MG tablet Take 1 tablet (10 mg total) by mouth daily. Patient taking differently: Take 10 mg by mouth every evening. 11/02/22 10/28/23  Graig Lawyer, MD  fluticasone  (FLONASE ) 50 MCG/ACT nasal spray Place 1 spray into both nostrils daily. 06/27/23   Vernestine Gondola, PA-C  ibuprofen  (ADVIL ) 800 MG tablet TAKE 1 TABLET BY MOUTH EVERY 12 HOURS AS NEEDED 10/11/23   Graig Lawyer, MD  meclizine  (ANTIVERT ) 12.5 MG tablet Take 1 tablet (12.5 mg total) by mouth 3 (three) times daily as needed. 09/08/23 09/07/24  Roslyn Coombe, MD  nitroGLYCERIN  (NITROSTAT ) 0.4 MG SL tablet Place 1 tablet (0.4 mg total) under the tongue every 5 (five) minutes x 3 doses as needed for chest pain. 04/26/23   Rai, Hurman Maiden, MD  ondansetron  (ZOFRAN ) 4 MG tablet Take 1 tablet (4 mg total) by mouth every 8 (eight) hours as needed for nausea or vomiting. 04/26/23   Rai, Ripudeep Linnell Richardson, MD  ondansetron  (ZOFRAN -ODT) 4 MG disintegrating tablet Take 1 tablet (4 mg total) by mouth every 8 (eight) hours as needed. 09/08/23  Roslyn Coombe, MD  pantoprazole  (PROTONIX ) 40 MG tablet Take Protonix  40 mg twice a day for 2 weeks, then once daily daily thereafter 04/26/23   Rai, Hurman Maiden, MD  predniSONE  (DELTASONE ) 10 MG tablet 3 tabs by mouth per day for 3 days,2tabs per day for 3 days,1tab per day for 3 days 09/08/23   Roslyn Coombe, MD  sitaGLIPtin  (JANUVIA ) 25 MG tablet Take 1 tablet (25 mg total) by mouth daily. 04/26/23   Rai, Hurman Maiden, MD  traZODone  (DESYREL ) 50 MG tablet Take 1 tablet (50 mg total) by mouth at bedtime as needed for sleep. 04/26/23   Rai, Hurman Maiden, MD    Allergies: Lipitor  [atorvastatin ] and Glucotrol  Clay.Close ]    Review of Systems  Musculoskeletal:  Positive for myalgias.    Updated Vital Signs BP (!) 141/83   Pulse 65   Temp 98.2 F (36.8 C) (Oral)   Resp 18   Ht 5' 2 (1.575 m)   Wt 71.2 kg   SpO2 100%   BMI 28.72 kg/m   Physical Exam Vitals and nursing note reviewed.  Constitutional:      General: She is not in acute distress.    Appearance: She is well-developed.  HENT:     Head: Normocephalic and atraumatic.   Eyes:     Conjunctiva/sclera: Conjunctivae normal.    Cardiovascular:     Rate and Rhythm: Normal rate and regular rhythm.     Heart sounds: No murmur heard. Pulmonary:     Effort: Pulmonary effort is normal. No respiratory distress.     Breath sounds: Normal breath sounds.  Abdominal:     Palpations: Abdomen is soft.     Tenderness: There is no abdominal tenderness.   Musculoskeletal:        General: No swelling.     Cervical back: Neck supple.     Comments: Tenderness over right lower anterior rib, no overlying ecchymosis or gross deformities   Skin:    General: Skin is warm and dry.     Capillary Refill: Capillary refill takes less than 2 seconds.   Neurological:     Mental Status: She is alert.   Psychiatric:        Mood and Affect: Mood normal.     (all labs ordered are listed, but only abnormal results are displayed) Labs Reviewed - No data to display  EKG: None  Radiology: DG Ribs Unilateral W/Chest Right Result Date: 11/12/2023 CLINICAL DATA:  Fall EXAM: RIGHT RIBS AND CHEST - 3+ VIEW COMPARISON:  None Available. FINDINGS: Normal cardiac silhouette. No pulmonary contusion or pleural fluid. No pneumothorax. No thoracic fracture identified. No RIGHT rib fracture. IMPRESSION: 1. No RIGHT rib fracture. 2. No acute cardiopulmonary process. Electronically Signed   By: Deboraha Fallow M.D.   On: 11/12/2023 10:56     Ultrasound ED FAST  Date/Time: 11/12/2023 10:13 AM  Performed by: Felicie Horning, PA-C Authorized by: Felicie Horning, PA-C  Procedure details:    Indications: blunt abdominal trauma and blunt chest trauma       Assess for:  Hemothorax and intra-abdominal fluid    Technique:  Abdominal          Abdominal findings:     Hepatorenal space visualized: identified      Medications Ordered in the ED  ketorolac  (TORADOL ) 15 MG/ML injection 15 mg (15 mg Intramuscular Given 11/12/23 1100)  Medical Decision Making Amount and/or Complexity of Data Reviewed Radiology: ordered.  Risk Prescription drug management.   This patient presents to the ED with chief complaint(s) of mechanical fall.  The complaint involves an extensive differential diagnosis and also carries with it a high risk of complications and morbidity.   Pertinent past medical history as listed in HPI  The differential diagnosis includes  Eksir, dislocation, sprain, intrathoracic or abdominal injury Additional history obtained: Records reviewed Care Everywhere/External Records  Assessment and management:   Hemodynamically stable, nontoxic-appearing patient presenting following mechanical fall onto her right side.  Pain is worse with respirations.  On exam she has tenderness to right lower anterior rib there is no gross deformities or ecchymosis noted.  Her abdomen is nontender.  Breath sounds are symmetric.  No evidence of intra-abdominal fluid or hemothorax on POCUS.   X-ray without any obvious fracture or acute abnormality.  Patient reports that when she fell she felt the rib move.  I suspect that she subluxed the rib.  Will provide incentive spirometry here today.  Shot of Toradol .  Encouraged alternate Tylenol  and ibuprofen  and follow-up with PCP should symptoms persist.  Discussed strict return precautions.  Patient is understanding agreement with plan.  Independent ECG interpretation:  none  Independent labs interpretation:  The following labs were  independently interpreted:  none  Independent visualization and interpretation of imaging: I independently visualized the following imaging with scope of interpretation limited to determining acute life threatening conditions related to emergency care: Chest x-ray no acute abnormality noted   Consultations obtained:   none  Disposition:   Patient will be discharged home. The patient has been appropriately medically screened and/or stabilized in the ED. I have low suspicion for any other emergent medical condition which would require further screening, evaluation or treatment in the ED or require inpatient management. At time of discharge the patient is hemodynamically stable and in no acute distress. I have discussed work-up results and diagnosis with patient and answered all questions. Patient is agreeable with discharge plan. We discussed strict return precautions for returning to the emergency department and they verbalized understanding.     Social Determinants of Health:   none  This note was dictated with voice recognition software.  Despite best efforts at proofreading, errors may have occurred which can change the documentation meaning.       Final diagnoses:  Fall, initial encounter    ED Discharge Orders     None          Stanton Earthly 11/12/23 1122    Carin Charleston, MD 11/12/23 [phone]

## 2023-11-12 NOTE — ED Notes (Signed)
 Patient transported to X-ray

## 2023-11-12 NOTE — Discharge Instructions (Signed)
 You were evaluated in the emergency room for rib pain.  Your x-ray and ultrasound did not show any obvious injuries.  I suspect you may have subluxed the rib.  You may alternate Tylenol  and ibuprofen  throughout the day.  Please continue to use the incentive spirometer as long as your symptoms persist. If your symptoms persist please follow-up with your primary care doctor if you experience any new or worsening symptoms please return the emergency room.

## 2023-11-12 NOTE — ED Notes (Addendum)
 Provided patient with discharge paperwork and incentive spirometer. Pt demonstrated understanding. Denies any questions, comments, or concerns. PT ambulatory upon discharge

## 2023-11-12 NOTE — ED Triage Notes (Signed)
 Pt had a fall yesterday and hit her right chest/ribcage on the counter. Pt c.o pain when she breathes

## 2023-11-23 ENCOUNTER — Ambulatory Visit: Payer: Self-pay | Admitting: Family Medicine

## 2023-11-23 ENCOUNTER — Ambulatory Visit (INDEPENDENT_AMBULATORY_CARE_PROVIDER_SITE_OTHER): Admitting: Family Medicine

## 2023-11-23 ENCOUNTER — Encounter: Payer: Self-pay | Admitting: Family Medicine

## 2023-11-23 VITALS — BP 118/76 | HR 74 | Temp 97.4°F | Ht 62.0 in | Wt 157.6 lb

## 2023-11-23 DIAGNOSIS — Z23 Encounter for immunization: Secondary | ICD-10-CM

## 2023-11-23 DIAGNOSIS — I10 Essential (primary) hypertension: Secondary | ICD-10-CM

## 2023-11-23 DIAGNOSIS — J301 Allergic rhinitis due to pollen: Secondary | ICD-10-CM | POA: Diagnosis not present

## 2023-11-23 DIAGNOSIS — Z1231 Encounter for screening mammogram for malignant neoplasm of breast: Secondary | ICD-10-CM

## 2023-11-23 DIAGNOSIS — R0781 Pleurodynia: Secondary | ICD-10-CM | POA: Diagnosis not present

## 2023-11-23 DIAGNOSIS — E782 Mixed hyperlipidemia: Secondary | ICD-10-CM

## 2023-11-23 DIAGNOSIS — E119 Type 2 diabetes mellitus without complications: Secondary | ICD-10-CM

## 2023-11-23 DIAGNOSIS — N3000 Acute cystitis without hematuria: Secondary | ICD-10-CM

## 2023-11-23 LAB — BASIC METABOLIC PANEL WITH GFR
BUN: 14 mg/dL (ref 6–23)
CO2: 31 meq/L (ref 19–32)
Calcium: 9.5 mg/dL (ref 8.4–10.5)
Chloride: 101 meq/L (ref 96–112)
Creatinine, Ser: 0.71 mg/dL (ref 0.40–1.20)
GFR: 93.12 mL/min (ref >60.00)
Glucose, Bld: 115 mg/dL — ABNORMAL HIGH (ref 70–99)
Potassium: 3.7 meq/L (ref 3.5–5.1)
Sodium: 137 meq/L (ref 135–145)

## 2023-11-23 LAB — URINALYSIS, ROUTINE W REFLEX MICROSCOPIC
Bilirubin Urine: NEGATIVE
Nitrite: POSITIVE — AB
Specific Gravity, Urine: 1.025 (ref 1.000–1.030)
Total Protein, Urine: NEGATIVE
Urine Glucose: NEGATIVE
Urobilinogen, UA: 0.2 (ref 0.0–1.0)
pH: 6 (ref 5.0–8.0)

## 2023-11-23 LAB — HEMOGLOBIN A1C: Hgb A1c MFr Bld: 6.4 % (ref 4.6–6.5)

## 2023-11-23 LAB — LIPID PANEL
Cholesterol: 205 mg/dL — ABNORMAL HIGH (ref 0–200)
HDL: 55.5 mg/dL (ref >39.00)
LDL Cholesterol: 126 mg/dL — ABNORMAL HIGH (ref 0–99)
NonHDL: 149.24
Total CHOL/HDL Ratio: 4
Triglycerides: 116 mg/dL (ref 0.0–149.0)
VLDL: 23.2 mg/dL (ref 0.0–40.0)

## 2023-11-23 LAB — MICROALBUMIN / CREATININE URINE RATIO
Creatinine,U: 177.8 mg/dL
Microalb Creat Ratio: 20.8 mg/g (ref 0.0–30.0)
Microalb, Ur: 3.7 mg/dL — ABNORMAL HIGH (ref 0.0–1.9)

## 2023-11-23 MED ORDER — SULFAMETHOXAZOLE-TRIMETHOPRIM 800-160 MG PO TABS: 1.0000 | ORAL_TABLET | Freq: Two times a day (BID) | ORAL | 0 refills | Status: DC

## 2023-11-23 NOTE — Assessment & Plan Note (Signed)
 Diabetes has been in good control. I will check annual DM labs today. Continue sitagliptin  25 mg daily.

## 2023-11-23 NOTE — Assessment & Plan Note (Signed)
 Recommend she add pseudoephedrine to her regimen for 3-5 days to relieve congestion.

## 2023-11-23 NOTE — Assessment & Plan Note (Signed)
 I will check lipids today. Continue ezetimibe  10 mg daily.

## 2023-11-23 NOTE — Assessment & Plan Note (Signed)
Blood pressure is in good control. Continue to manage with diet and weight management.

## 2023-11-23 NOTE — Progress Notes (Signed)
 [facility]ARE [facility] [address] [place] [address] Dept: [phone] Dept Fax: [phone]  Chronic Care Office Visit  Subjective:    Patient ID: Jill Anderson, female    DOB: 1965/12/20, 58 y.o..   MRN: [id number]  Chief Complaint  Patient presents with   Follow-up    F/u rib pain.  Also having HA''s, sinus pressure, urine dark/smells.   History of Present Illness:  Patient is in today for reassessment of chronic medical issues.  Jill Anderson had a recent ED visit, having fallen at home. She was felt to have a rib subluxation. She notes that the pain has been improving.  Jill Anderson has a history of Type 2 diabetes. She is managed on sitagliptin  (Januvia ) 25 mg daily.   Jill Anderson  has a history of hypertension. She has been managing this with diet and exercise.   Jill Anderson has a history of hyperlipidemia. She has been unable to tolerate statins due to myalgias. She is currently on ezetimibe  10 mg daily under the direction of Dr. Mona.  Jill Anderson notes she has had some recent sinus congestion and headache. This flared after she returned to [place] form a trip to [place], [place]. She does use a daily fluticasone  nasal spray.  Past Medical History: Patient Active Problem List   Diagnosis Date Noted   Diarrhea, infectious, adult 09/11/2023   Epigastric pain 09/11/2023   Vertigo 09/11/2023   Coronary artery disease- Mild RCA and LAD disease 05/05/2023   Family history of heart disease 04/24/2023   Palpitations 04/24/2023   Overweight 04/24/2023   Plantar fasciitis of left foot 11/02/2022   Hepatic steatosis 10/14/2021   Aortic atherosclerosis (HCC) 10/14/2021   Umbilical hernia 10/14/2021   Hemorrhoids 08/06/2021   Allergic rhinitis 08/06/2021   Myalgia due to statin 12/12/2018   Vitamin D  deficiency 09/21/2017   Insomnia 05/14/2017   Menopausal symptoms 01/04/2017   Urticaria 12/14/2016   Pain in both hands 12/14/2016    Essential hypertension 10/27/2016   Bilateral anterior knee pain 03/20/2016   DDD (degenerative disc disease), lumbosacral 11/15/2015   Low back pain radiating to both legs 11/15/2015   Left ureteral stone 10/27/2015   New onset of headaches after age 65 10/07/2015   Hyperlipidemia 07/15/2015   Type 2 diabetes mellitus (HCC) 03/04/2015   Chronic neck pain 09/11/2014   Cervical disc disorder with radiculopathy of cervical region 09/11/2014   Chronic radicular low back pain 09/11/2014   Past Surgical History:  Procedure Laterality Date   ABDOMINAL HYSTERECTOMY     ABDOMINOPLASTY/PANNICULECTOMY WITH LIPOSUCTION  06/26/2022   Performed in [facility]   BACK SURGERY     CYSTOSCOPY WITH RETROGRADE PYELOGRAM, URETEROSCOPY AND STENT PLACEMENT Left 10/28/2015   Procedure: CYSTOSCOPY WITH RETROGRADE PYELOGRAM, URETEROSCOPY AND STENT PLACEMENT, AND LASER;  Surgeon: Norleen Seltzer, MD;  Location: [facility];  Service: Urology;  Laterality: Left;   LEFT HEART CATH AND CORONARY ANGIOGRAPHY N/A 04/26/2023   Procedure: LEFT HEART CATH AND CORONARY ANGIOGRAPHY;  Surgeon: Verlin Lonni BIRCH, MD;  Location: [facility];  Service: Cardiovascular;  Laterality: N/A;   Family History  Problem Relation Age of Onset   Diabetes Mother    Hypertension Mother    Heart disease Mother        based at age 27 of MI   Diabetes Father    Hypertension Father    Heart disease Father        Diabetic,  heart  disease   Cancer Maternal Aunt        liver   Colon cancer Neg Hx    Esophageal cancer Neg Hx    Stomach cancer Neg Hx    Outpatient Medications Prior to Visit  Medication Sig Dispense Refill   acetaminophen -codeine  (TYLENOL  #3) 300-30 MG tablet TAKE 1 TABLET BY MOUTH EVERY 8 HOURS AS NEEDED FOR MODERATE PAIN 60 tablet 0   aspirin  EC 81 MG tablet Take 1 tablet (81 mg total) by mouth daily. (Patient taking differently: Take 81 mg by mouth every evening.) 30 tablet 11   Blood Glucose Monitoring  Suppl DEVI 1 each by Does not apply route in the morning, at noon, and at bedtime. May substitute to any manufacturer covered by patient's insurance. 1 each 0   cyclobenzaprine  (FLEXERIL ) 5 MG tablet Take 1 tablet (5 mg total) by mouth 2 (two) times daily. 30 tablet 0   diphenoxylate -atropine  (LOMOTIL ) 2.5-0.025 MG tablet Take 1 tablet by mouth 4 (four) times daily as needed for diarrhea or loose stools. 30 tablet 0   ezetimibe  (ZETIA ) 10 MG tablet Take 1 tablet (10 mg total) by mouth daily. (Patient taking differently: Take 10 mg by mouth every evening.) 90 tablet 3   fluticasone  (FLONASE ) 50 MCG/ACT nasal spray Place 1 spray into both nostrils daily. 15.8 mL 0   ibuprofen  (ADVIL ) 800 MG tablet TAKE 1 TABLET BY MOUTH EVERY 12 HOURS AS NEEDED 60 tablet 0   meclizine  (ANTIVERT ) 12.5 MG tablet Take 1 tablet (12.5 mg total) by mouth 3 (three) times daily as needed. 40 tablet 1   nitroGLYCERIN  (NITROSTAT ) 0.4 MG SL tablet Place 1 tablet (0.4 mg total) under the tongue every 5 (five) minutes x 3 doses as needed for chest pain. 30 tablet 12   ondansetron  (ZOFRAN ) 4 MG tablet Take 1 tablet (4 mg total) by mouth every 8 (eight) hours as needed for nausea or vomiting. 30 tablet 0   ondansetron  (ZOFRAN -ODT) 4 MG disintegrating tablet Take 1 tablet (4 mg total) by mouth every 8 (eight) hours as needed. 30 tablet 1   pantoprazole  (PROTONIX ) 40 MG tablet Take Protonix  40 mg twice a day for 2 weeks, then once daily daily thereafter 60 tablet 3   predniSONE  (DELTASONE ) 10 MG tablet 3 tabs by mouth per day for 3 days,2tabs per day for 3 days,1tab per day for 3 days 18 tablet 0   sitaGLIPtin  (JANUVIA ) 25 MG tablet Take 1 tablet (25 mg total) by mouth daily. 30 tablet 3   traZODone  (DESYREL ) 50 MG tablet Take 1 tablet (50 mg total) by mouth at bedtime as needed for sleep. 30 tablet 0   No facility-administered medications prior to visit.   Allergies  Allergen Reactions   Lipitor [Atorvastatin ] Other (See Comments)     Myalgia    Glucotrol  [Glipizide ] Other (See Comments)    Burning in stomach   Objective:   Today's Vitals   11/23/23 0913  BP: 118/76  Pulse: 74  Temp: (!) 97.4 F (36.3 C)  TempSrc: Temporal  SpO2: 99%  Weight: 157 lb 9.6 oz (71.5 kg)  Height: 5' 2 (1.575 m)   Body mass index is 28.83 kg/m.   General: Well developed, well nourished. No acute distress. HEENT: Normocephalic, non-traumatic. PERRL, EOMI. Conjunctiva clear. External ears normal. EAC and TMs   normal bilaterally. Nose with mild congestion and clear rhinorrhea. Mucous membranes moist. Oropharynx   clear. Good dentition. Chest: Mild tenderness over the right lower costal border.  Psych: Alert and oriented. Normal mood and affect.  Health Maintenance Due  Topic Date Due   Hepatitis B Vaccines (1 of 3 - 19+ 3-dose series) Never done   Pneumococcal Vaccine 24-56 Years old (2 of 2 - PCV) 07/11/2016   Colonoscopy  06/02/2023   MAMMOGRAM  10/06/2023   OPHTHALMOLOGY EXAM  10/12/2023   Diabetic kidney evaluation - Urine ACR  11/02/2023     Assessment & Plan:   Problem List Items Addressed This Visit       Cardiovascular and Mediastinum   Essential hypertension - Primary   Blood pressure is in good control. Continue to manage with diet and weight management.         Respiratory   Allergic rhinitis   Recommend she add pseudoephedrine to her regimen for 3-5 days to relieve congestion.        Endocrine   Type 2 diabetes mellitus (HCC)   Diabetes has been in good control. I will check annual DM labs today. Continue sitagliptin  25 mg daily.      Relevant Orders   Microalbumin / creatinine urine ratio   Basic metabolic panel with GFR   Hemoglobin A1c   Urinalysis, Routine w reflex microscopic     Other   Hyperlipidemia   I will check lipids today. Continue ezetimibe  10 mg daily.      Relevant Orders   Lipid panel   Other Visit Diagnoses       Rib pain       Improving. Continue expectant  management.     Need for pneumococcal 20-valent conjugate vaccination       Relevant Orders   Pneumococcal conjugate vaccine 20-valent (Prevnar 20) (Completed)     Encounter for screening mammogram for malignant neoplasm of breast       Relevant Orders   MM DIGITAL SCREENING BILATERAL       Return in about 3 months (around 02/23/2024).   Garnette CHRISTELLA Simpler, MD

## 2023-12-20 ENCOUNTER — Ambulatory Visit
Admission: RE | Admit: 2023-12-20 | Discharge: 2023-12-20 | Disposition: A | Discharge status: Home / Self Care | Source: Ambulatory Visit | Attending: Family Medicine | Admitting: Family Medicine

## 2023-12-20 DIAGNOSIS — Z1231 Encounter for screening mammogram for malignant neoplasm of breast: Secondary | ICD-10-CM

## 2023-12-23 ENCOUNTER — Telehealth: Payer: Self-pay

## 2023-12-23 NOTE — Progress Notes (Signed)
   12/23/2023  Patient ID: Jill Anderson, female   DOB: Feb 06, 1966, 58 y.o.   MRN: [id number]  This patient is appearing on a report for being at risk of failing the adherence measure for diabetes medications this calendar year.   Medication: Januvia  25mg  Last fill date: 11/15/23 for 30 day supply  Insurance report was not up to date. No action needed at this time. But I am sending a MyChart message to initiate refill for this month for Januvia  25mg , as patient should be close to running out of last refill.  Channing DELENA Mealing, PharmD, DPLA

## 2024-01-11 ENCOUNTER — Other Ambulatory Visit: Payer: Self-pay | Admitting: Family Medicine

## 2024-01-11 DIAGNOSIS — M5010 Cervical disc disorder with radiculopathy, unspecified cervical region: Secondary | ICD-10-CM

## 2024-01-11 DIAGNOSIS — E782 Mixed hyperlipidemia: Secondary | ICD-10-CM

## 2024-01-11 DIAGNOSIS — G8929 Other chronic pain: Secondary | ICD-10-CM

## 2024-01-28 ENCOUNTER — Ambulatory Visit: Payer: Self-pay

## 2024-01-28 NOTE — Telephone Encounter (Signed)
 FYI Only or Action Required?: FYI only for provider.  Patient was last seen in primary care on 11/23/2023 by Thedora Garnette HERO, MD.  Called Nurse Triage reporting Hypertension.  Symptoms began yesterday.  Interventions attempted: Nothing.  Symptoms are: gradually worsening.  Triage Disposition: Go to ED Now (Notify PCP)  Patient/caregiver understands and will follow disposition?: Yes      Copied from CRM [id number]. Topic: Clinical - Red Word Triage >> Jan 28, 2024  8:16 AM Rosina BIRCH wrote: Reason for CRM:bad headaches for three days and her blood pressure is 185 Reason for Disposition  [1] Systolic BP >= 160 OR Diastolic >= 100 AND [2] cardiac (e.g., breathing difficulty, chest pain) or neurologic symptoms (e.g., new-onset blurred or double vision, unsteady gait)  Answer Assessment - Initial Assessment Questions 1. BLOOD PRESSURE: What is your blood pressure? Did you take at least two measurements 5 minutes apart?     185/79 2. ONSET: When did you take your blood pressure?     Yesterday in the afternoon 3. HOW: How did you take your blood pressure? (e.g., automatic home BP monitor, visiting nurse)     Automatic home bp cuff 4. HISTORY: Do you have a history of high blood pressure?     Yes, HTN 5. MEDICINES: Are you taking any medicines for blood pressure? Have you missed any doses recently?     No medication 6. OTHER SYMPTOMS: Do you have any symptoms? (e.g., blurred vision, chest pain, difficulty breathing, headache, weakness)     Headache,  7. PREGNANCY: Is there any chance you are pregnant? When was your last menstrual period?     na  Protocols used: Blood Pressure - High-A-AH

## 2024-02-07 NOTE — Telephone Encounter (Signed)
 Left voicemail asking patient to return call at [phone].

## 2024-02-24 ENCOUNTER — Encounter: Payer: Self-pay | Admitting: Family Medicine

## 2024-02-24 ENCOUNTER — Ambulatory Visit (INDEPENDENT_AMBULATORY_CARE_PROVIDER_SITE_OTHER): Admitting: Family Medicine

## 2024-02-24 VITALS — BP 126/72 | HR 68 | Temp 97.0°F | Ht 62.0 in | Wt 159.4 lb

## 2024-02-24 DIAGNOSIS — Z7984 Long term (current) use of oral hypoglycemic drugs: Secondary | ICD-10-CM | POA: Diagnosis not present

## 2024-02-24 DIAGNOSIS — M542 Cervicalgia: Secondary | ICD-10-CM | POA: Diagnosis not present

## 2024-02-24 DIAGNOSIS — I2510 Atherosclerotic heart disease of native coronary artery without angina pectoris: Secondary | ICD-10-CM | POA: Diagnosis not present

## 2024-02-24 DIAGNOSIS — E782 Mixed hyperlipidemia: Secondary | ICD-10-CM | POA: Diagnosis not present

## 2024-02-24 DIAGNOSIS — Z114 Encounter for screening for human immunodeficiency virus [HIV]: Secondary | ICD-10-CM

## 2024-02-24 DIAGNOSIS — G8929 Other chronic pain: Secondary | ICD-10-CM

## 2024-02-24 DIAGNOSIS — I10 Essential (primary) hypertension: Secondary | ICD-10-CM

## 2024-02-24 DIAGNOSIS — E119 Type 2 diabetes mellitus without complications: Secondary | ICD-10-CM | POA: Diagnosis not present

## 2024-02-24 LAB — GLUCOSE, RANDOM: Glucose, Bld: 105 mg/dL — ABNORMAL HIGH (ref 70–99)

## 2024-02-24 LAB — HEMOGLOBIN A1C: Hgb A1c MFr Bld: 6.6 % — ABNORMAL HIGH (ref 4.6–6.5)

## 2024-02-24 NOTE — Assessment & Plan Note (Signed)
 LDL cholesterol remains above goal. Continue ezetimibe  10 mg daily. Continue to follow with Dr. Mona.

## 2024-02-24 NOTE — Progress Notes (Signed)
 [facility] [address] [place] [address] Dept: [phone] Dept Fax: [phone]  Chronic Care Office Visit  Subjective:    Patient ID: Jill Anderson, female    DOB: 02-23-1966, 58 y.o..   MRN: [id number]  Chief Complaint  Patient presents with   Hypertension    3 month f/u HTN.  C/o having HA's & vertigo still.    History of Present Illness:  Patient is in today for reassessment of chronic medical issues.  Jill Anderson has a history of Type 2 diabetes. She is managed on sitagliptin  (Januvia ) 25 mg daily. She had a day recently where her blood sugar was above 150. However, it has settled back down again.   Jill Anderson  has a history of hypertension. She has been managing this with diet and exercise.   Jill Anderson has a history of hyperlipidemia. She has been unable to tolerate statins due to myalgias. She is currently on ezetimibe  10 mg daily under the direction of Dr. Mona.   Jill Anderson has a history of chronic neck pain due to cervical disc disease. This si associated with some occipital headaches and occasional bouts of vertigo when rolling over in the bed. She finds that low pressure weather systems here in [place] tend to set this off. She was offered neck surgery, but did not want to pursue this. She manages with ice and taking Tylenol  #3 2-3 times a week. She will also occasionally take 800 mg of ibuprofen .  Past Medical History: Patient Active Problem List   Diagnosis Date Noted   Diarrhea, infectious, adult 09/11/2023   Epigastric pain 09/11/2023   Vertigo 09/11/2023   Coronary artery disease- Mild RCA and LAD disease 05/05/2023   Family history of heart disease 04/24/2023   Palpitations 04/24/2023   Overweight 04/24/2023   Plantar fasciitis of left foot 11/02/2022   Hepatic steatosis 10/14/2021   Aortic atherosclerosis 10/14/2021   Umbilical hernia 10/14/2021   Hemorrhoids 08/06/2021   Allergic rhinitis  08/06/2021   Myalgia due to statin 12/12/2018   Vitamin D  deficiency 09/21/2017   Insomnia 05/14/2017   Menopausal symptoms 01/04/2017   Urticaria 12/14/2016   Pain in both hands 12/14/2016   Essential hypertension 10/27/2016   Bilateral anterior knee pain 03/20/2016   DDD (degenerative disc disease), lumbosacral 11/15/2015   Low back pain radiating to both legs 11/15/2015   Left ureteral stone 10/27/2015   New onset of headaches after age 78 10/07/2015   Hyperlipidemia 07/15/2015   Type 2 diabetes mellitus (HCC) 03/04/2015   Chronic neck pain 09/11/2014   Cervical disc disorder with radiculopathy of cervical region 09/11/2014   Chronic radicular low back pain 09/11/2014   Past Surgical History:  Procedure Laterality Date   ABDOMINAL HYSTERECTOMY     ABDOMINOPLASTY/PANNICULECTOMY WITH LIPOSUCTION  06/26/2022   Performed in [facility] [employer]   BACK SURGERY     CYSTOSCOPY WITH RETROGRADE PYELOGRAM, URETEROSCOPY AND STENT PLACEMENT Left 10/28/2015   Procedure: CYSTOSCOPY WITH RETROGRADE PYELOGRAM, URETEROSCOPY AND STENT PLACEMENT, AND LASER;  Surgeon: Norleen Seltzer, MD;  Location: [facility];  Service: Urology;  Laterality: Left;   LEFT HEART CATH AND CORONARY ANGIOGRAPHY N/A 04/26/2023   Procedure: LEFT HEART CATH AND CORONARY ANGIOGRAPHY;  Surgeon: Verlin Lonni BIRCH, MD;  Location: [facility];  Service: Cardiovascular;  Laterality: N/A;   Family History  Problem Relation Age of Onset   Diabetes Mother    Hypertension Mother    Heart disease Mother  based at age 70 of MI   Diabetes Father    Hypertension Father    Heart disease Father        Diabetic,  heart disease   Cancer Maternal Aunt        liver   Colon cancer Neg Hx    Esophageal cancer Neg Hx    Stomach cancer Neg Hx    Outpatient Medications Prior to Visit  Medication Sig Dispense Refill   acetaminophen -codeine  (TYLENOL  #3) 300-30 MG tablet TAKE 1 TABLET BY MOUTH EVERY 8 HOURS AS NEEDED FOR MODERATE  PAIN 60 tablet 1   aspirin  EC 81 MG tablet Take 1 tablet (81 mg total) by mouth daily. 30 tablet 11   Blood Glucose Monitoring Suppl DEVI 1 each by Does not apply route in the morning, at noon, and at bedtime. May substitute to any manufacturer covered by patient's insurance. 1 each 0   cyclobenzaprine  (FLEXERIL ) 5 MG tablet Take 1 tablet (5 mg total) by mouth 2 (two) times daily. 30 tablet 0   diphenoxylate -atropine  (LOMOTIL ) 2.5-0.025 MG tablet Take 1 tablet by mouth 4 (four) times daily as needed for diarrhea or loose stools. 30 tablet 0   ezetimibe  (ZETIA ) 10 MG tablet Take 1 tablet by mouth once daily 90 tablet 3   fluticasone  (FLONASE ) 50 MCG/ACT nasal spray Place 1 spray into both nostrils daily. 15.8 mL 0   ibuprofen  (ADVIL ) 800 MG tablet TAKE 1 TABLET BY MOUTH EVERY 12 HOURS AS NEEDED 60 tablet 2   meclizine  (ANTIVERT ) 12.5 MG tablet Take 1 tablet (12.5 mg total) by mouth 3 (three) times daily as needed. 40 tablet 1   nitroGLYCERIN  (NITROSTAT ) 0.4 MG SL tablet Place 1 tablet (0.4 mg total) under the tongue every 5 (five) minutes x 3 doses as needed for chest pain. 30 tablet 12   ondansetron  (ZOFRAN ) 4 MG tablet Take 1 tablet (4 mg total) by mouth every 8 (eight) hours as needed for nausea or vomiting. 30 tablet 0   ondansetron  (ZOFRAN -ODT) 4 MG disintegrating tablet Take 1 tablet (4 mg total) by mouth every 8 (eight) hours as needed. 30 tablet 1   pantoprazole  (PROTONIX ) 40 MG tablet Take Protonix  40 mg twice a day for 2 weeks, then once daily daily thereafter 60 tablet 3   sitaGLIPtin  (JANUVIA ) 25 MG tablet Take 1 tablet (25 mg total) by mouth daily. 30 tablet 3   traZODone  (DESYREL ) 50 MG tablet Take 1 tablet (50 mg total) by mouth at bedtime as needed for sleep. 30 tablet 0   predniSONE  (DELTASONE ) 10 MG tablet 3 tabs by mouth per day for 3 days,2tabs per day for 3 days,1tab per day for 3 days 18 tablet 0   sulfamethoxazole -trimethoprim  (BACTRIM  DS) 800-160 MG tablet Take 1 tablet by  mouth 2 (two) times daily. 6 tablet 0   No facility-administered medications prior to visit.   Allergies  Allergen Reactions   Lipitor [Atorvastatin ] Other (See Comments)    Myalgia    Glucotrol  [Glipizide ] Other (See Comments)    Burning in stomach   Objective:   Today's Vitals   02/24/24 0914  BP: 126/72  Pulse: 68  Temp: (!) 97 F (36.1 C)  TempSrc: Temporal  SpO2: 97%  Weight: 159 lb 6.4 oz (72.3 kg)  Height: 5' 2 (1.575 m)   Body mass index is 29.15 kg/m.   General: Well developed, well nourished. No acute distress. Neck: Supple. Mild discomfort with palpation over the paracervical muscles. Psych: Alert and  oriented. Normal mood and affect.  Health Maintenance Due  Topic Date Due   Hepatitis B Vaccines 19-59 Average Risk (1 of 3 - 19+ 3-dose series) Never done   Colonoscopy  06/02/2023   OPHTHALMOLOGY EXAM  10/12/2023   Medicare Annual Wellness (AWV)  02/29/2024     Assessment & Plan:   Problem List Items Addressed This Visit       Cardiovascular and Mediastinum   Coronary artery disease- Mild RCA and LAD disease   Mild disease. We will continue to focus on medical management.      Essential hypertension   Blood pressure is in good control. Continue to manage with diet and weight management.         Endocrine   Type 2 diabetes mellitus (HCC) - Primary   Diabetes has been in good control. I will check and A1c today. Continue sitagliptin  25 mg daily.      Relevant Orders   Glucose, random   Hemoglobin A1c     Other   Chronic neck pain (Chronic)   Managing medically with application of ice and use of intermittent ibuprofen  or Tylenol  #3.      Hyperlipidemia   LDL cholesterol remains above goal. Continue ezetimibe  10 mg daily. Continue to follow with Dr. Mona.      Other Visit Diagnoses       Screening for HIV (human immunodeficiency virus)       Relevant Orders   HIV Antibody (routine testing w rflx)       Return in about 3 months  (around 05/25/2024) for Reassessment.   Garnette CHRISTELLA Simpler, MD

## 2024-02-24 NOTE — Assessment & Plan Note (Signed)
 Managing medically with application of ice and use of intermittent ibuprofen  or Tylenol  #3.

## 2024-02-24 NOTE — Assessment & Plan Note (Signed)
 Mild disease. We will continue to focus on medical management.

## 2024-02-24 NOTE — Assessment & Plan Note (Signed)
 Diabetes has been in good control. I will check and A1c today. Continue sitagliptin  25 mg daily.

## 2024-02-24 NOTE — Assessment & Plan Note (Signed)
Blood pressure is in good control. Continue to manage with diet and weight management.

## 2024-02-25 ENCOUNTER — Ambulatory Visit: Payer: Self-pay | Admitting: Family Medicine

## 2024-02-25 LAB — HIV ANTIBODY (ROUTINE TESTING W REFLEX)
HIV 1&2 Ab, 4th Generation: NONREACTIVE
HIV FINAL INTERPRETATION: NEGATIVE

## 2024-03-06 ENCOUNTER — Ambulatory Visit: Payer: Medicare HMO

## 2024-03-06 DIAGNOSIS — Z0000 Encounter for general adult medical examination without abnormal findings: Secondary | ICD-10-CM | POA: Diagnosis not present

## 2024-03-06 DIAGNOSIS — Z1211 Encounter for screening for malignant neoplasm of colon: Secondary | ICD-10-CM

## 2024-03-06 NOTE — Progress Notes (Signed)
 Subjective:   BRIELE Anderson is a 58 y.o. who presents for a Medicare Wellness preventive visit.  As a reminder, Annual Wellness Visits don't include a physical exam, and some assessments may be limited, especially if this visit is performed virtually. We may recommend an in-person follow-up visit with your provider if needed.  Visit Complete: Virtual I connected with  [name] on 03/06/24 by a audio enabled telemedicine application and verified that I am speaking with the correct person using two identifiers.  Patient Location: Home  Provider Location: Office/Clinic  I discussed the limitations of evaluation and management by telemedicine. The patient expressed understanding and agreed to proceed.  Vital Signs: Because this visit was a virtual/telehealth visit, some criteria may be missing or patient reported. Any vitals not documented were not able to be obtained and vitals that have been documented are patient reported.  VideoError- Librarian, academic were attempted between this provider and patient, however failed, due to patient having technical difficulties OR patient did not have access to video capability.  We continued and completed visit with audio only.   Persons Participating in Visit: Patient.  AWV Questionnaire: No: Patient Medicare AWV questionnaire was not completed prior to this visit.  Cardiac Risk Factors include: diabetes mellitus;dyslipidemia;hypertension     Objective:    Today's Vitals   03/06/24 1006  PainSc: 4    There is no height or weight on file to calculate BMI.     03/06/2024   10:14 AM 04/24/2023   11:00 AM 04/24/2023    5:08 AM 03/01/2023   10:19 AM 03/18/2022   12:53 PM 10/09/2021    5:25 AM 04/18/2021   12:08 AM  Advanced Directives  Does Patient Have a Medical Advance Directive? No No No No No No No  Would patient like information on creating a medical advance directive?  No - Patient declined   No -  Patient declined No - Patient declined     Current Medications (verified) Outpatient Encounter Medications as of 03/06/2024  Medication Sig   acetaminophen -codeine  (TYLENOL  #3) 300-30 MG tablet TAKE 1 TABLET BY MOUTH EVERY 8 HOURS AS NEEDED FOR MODERATE PAIN   aspirin  EC 81 MG tablet Take 1 tablet (81 mg total) by mouth daily.   Blood Glucose Monitoring Suppl DEVI 1 each by Does not apply route in the morning, at noon, and at bedtime. May substitute to any manufacturer covered by patient's insurance.   cyclobenzaprine  (FLEXERIL ) 5 MG tablet Take 1 tablet (5 mg total) by mouth 2 (two) times daily.   diphenoxylate -atropine  (LOMOTIL ) 2.5-0.025 MG tablet Take 1 tablet by mouth 4 (four) times daily as needed for diarrhea or loose stools.   ezetimibe  (ZETIA ) 10 MG tablet Take 1 tablet by mouth once daily   fluticasone  (FLONASE ) 50 MCG/ACT nasal spray Place 1 spray into both nostrils daily.   ibuprofen  (ADVIL ) 800 MG tablet TAKE 1 TABLET BY MOUTH EVERY 12 HOURS AS NEEDED   meclizine  (ANTIVERT ) 12.5 MG tablet Take 1 tablet (12.5 mg total) by mouth 3 (three) times daily as needed.   nitroGLYCERIN  (NITROSTAT ) 0.4 MG SL tablet Place 1 tablet (0.4 mg total) under the tongue every 5 (five) minutes x 3 doses as needed for chest pain.   ondansetron  (ZOFRAN ) 4 MG tablet Take 1 tablet (4 mg total) by mouth every 8 (eight) hours as needed for nausea or vomiting.   ondansetron  (ZOFRAN -ODT) 4 MG disintegrating tablet Take 1 tablet (4 mg total) by  mouth every 8 (eight) hours as needed.   pantoprazole  (PROTONIX ) 40 MG tablet Take Protonix  40 mg twice a day for 2 weeks, then once daily daily thereafter   sitaGLIPtin  (JANUVIA ) 25 MG tablet Take 1 tablet (25 mg total) by mouth daily.   traZODone  (DESYREL ) 50 MG tablet Take 1 tablet (50 mg total) by mouth at bedtime as needed for sleep.   No facility-administered encounter medications on file as of 03/06/2024.    Allergies (verified) Lipitor [atorvastatin ] and  Glucotrol  [glipizide ]   History: Past Medical History:  Diagnosis Date   Borderline diabetic    Chronic back pain    Diabetes mellitus without complication (HCC)    Hypercholesteremia    Past Surgical History:  Procedure Laterality Date   ABDOMINAL HYSTERECTOMY     ABDOMINOPLASTY/PANNICULECTOMY WITH LIPOSUCTION  06/26/2022   Performed in [facility]   BACK SURGERY     CYSTOSCOPY WITH RETROGRADE PYELOGRAM, URETEROSCOPY AND STENT PLACEMENT Left 10/28/2015   Procedure: CYSTOSCOPY WITH RETROGRADE PYELOGRAM, URETEROSCOPY AND STENT PLACEMENT, AND LASER;  Surgeon: Norleen Seltzer, MD;  Location: [facility];  Service: Urology;  Laterality: Left;   LEFT HEART CATH AND CORONARY ANGIOGRAPHY N/A 04/26/2023   Procedure: LEFT HEART CATH AND CORONARY ANGIOGRAPHY;  Surgeon: Verlin Lonni BIRCH, MD;  Location: [facility];  Service: Cardiovascular;  Laterality: N/A;   Family History  Problem Relation Age of Onset   Diabetes Mother    Hypertension Mother    Heart disease Mother        based at age 17 of MI   Diabetes Father    Hypertension Father    Heart disease Father        Diabetic,  heart disease   Cancer Maternal Aunt        liver   Colon cancer Neg Hx    Esophageal cancer Neg Hx    Stomach cancer Neg Hx    Social History   Socioeconomic History   Marital status: Single    Spouse name: Not on file   Number of children: 3   Years of education: Not on file   Highest education level: Not on file  Occupational History   Occupation: Disabled  Tobacco Use   Smoking status: Never    Passive exposure: Never   Smokeless tobacco: Never  Vaping Use   Vaping status: Never Used  Substance and Sexual Activity   Alcohol use: No   Drug use: Never   Sexual activity: Not Currently  Other Topics Concern   Not on file  Social History Narrative   Not on file   Social Drivers of Health   Financial Resource Strain: Low Risk  (03/06/2024)   Overall Financial Resource Strain  (CARDIA)    Difficulty of Paying Living Expenses: Not hard at all  Food Insecurity: No Food Insecurity (03/06/2024)   Hunger Vital Sign    Worried About Running Out of Food in the Last Year: Never true    Ran Out of Food in the Last Year: Never true  Transportation Needs: No Transportation Needs (03/06/2024)   PRAPARE - Administrator, Civil Service (Medical): No    Lack of Transportation (Non-Medical): No  Physical Activity: Insufficiently Active (03/06/2024)   Exercise Vital Sign    Days of Exercise per Week: 1 day    Minutes of Exercise per Session: 10 min  Stress: Stress Concern Present (03/06/2024)   [employer] of Occupational Health - Occupational Stress Questionnaire  Feeling of Stress: To some extent  Social Connections: Moderately Isolated (03/06/2024)   Social Connection and Isolation Panel    Frequency of Communication with Friends and Family: More than three times a week    Frequency of Social Gatherings with Friends and Family: Twice a week    Attends Religious Services: More than 4 times per year    Active Member of [employer] or Organizations: No    Attends Engineer, structural: Never    Marital Status: Divorced    Tobacco Counseling Counseling given: Not Answered    Clinical Intake:  Pre-visit preparation completed: Yes  Pain : 0-10 Pain Score: 4  Pain Type: Acute pain Pain Location: Abdomen Pain Descriptors / Indicators: Burning Pain Onset: In the past 7 days Pain Frequency: Intermittent     Nutritional Risks: Nausea/ vomitting/ diarrhea (vomiting and diarrhea, has resolved) Diabetes: Yes CBG done?: No Did pt. bring in CBG monitor from home?: No  Lab Results  Component Value Date   HGBA1C 6.6 (H) 02/24/2024   HGBA1C 6.4 11/23/2023   HGBA1C 6.6 (H) 08/11/2023     How often do you need to have someone help you when you read instructions, pamphlets, or other written materials from your doctor or pharmacy?: 1 -  Never  Interpreter Needed?: No  Information entered by :: NAllen LPN   Activities of Daily Living     03/06/2024   10:08 AM 04/24/2023   11:00 AM  In your present state of health, do you have any difficulty performing the following activities:  Hearing? 0 0  Vision? 1 0  Difficulty concentrating or making decisions? 0 0  Walking or climbing stairs? 1   Comment due to back   Dressing or bathing? 0   Doing errands, shopping? 0   Preparing Food and eating ? N   Using the Toilet? N   In the past six months, have you accidently leaked urine? Y   Do you have problems with loss of bowel control? N   Managing your Medications? N   Managing your Finances? N   Housekeeping or managing your Housekeeping? N     Patient Care Team: Thedora Garnette HERO, MD as PCP - General (Family Medicine) O'Neal, Darryle Ned, MD as PCP - Cardiology (Cardiology) O'Neal, Darryle Ned, MD as Consulting Physician (Cardiology) Mona Vinie BROCKS, MD as Consulting Physician (Cardiology) Eda Iha, MD (Inactive) as Consulting Physician (Gastroenterology)  I have updated your Care Teams any recent Medical Services you may have received from other providers in the past year.     Assessment:   This is a routine wellness examination for Marquia.  Hearing/Vision screen Hearing Screening - Comments:: Denies hearing issues Vision Screening - Comments:: Regular eye exams, Looking for new eye doctor   Goals Addressed             This Visit's Progress    Patient Stated       03/06/2024, bring cholesterol down and keep sugar down       Depression Screen     03/06/2024   10:15 AM 09/08/2023    2:37 PM 07/29/2023    3:01 PM 03/01/2023   10:20 AM 11/02/2022    9:19 AM 03/18/2022   12:51 PM 08/06/2021    1:54 PM  PHQ 2/9 Scores  PHQ - 2 Score 0 0 0 0 0 1 2  PHQ- 9 Score 1   0   5    Fall Risk     03/06/2024  10:14 AM 09/08/2023    2:44 PM 07/29/2023    3:01 PM 03/01/2023   10:19 AM 11/02/2022    9:18  AM  Fall Risk   Falls in the past year? 0 0 0 0 0  Number falls in past yr: 0 0 0 0 0  Injury with Fall? 0 0 0 0 0  Risk for fall due to : Medication side effect No Fall Risks No Fall Risks Medication side effect;Impaired balance/gait;Impaired mobility No Fall Risks  Follow up Falls evaluation completed;Falls prevention discussed Falls evaluation completed Falls evaluation completed Falls prevention discussed;Falls evaluation completed Falls evaluation completed    MEDICARE RISK AT HOME:  Medicare Risk at Home Any stairs in or around the home?: No If so, are there any without handrails?: No Home free of loose throw rugs in walkways, pet beds, electrical cords, etc?: Yes Adequate lighting in your home to reduce risk of falls?: Yes Life alert?: No Use of a cane, walker or w/c?: No Grab bars in the bathroom?: No Shower chair or bench in shower?: No Elevated toilet seat or a handicapped toilet?: Yes  TIMED UP AND GO:  Was the test performed?  No  Cognitive Function: 6CIT completed        03/06/2024   10:15 AM 03/01/2023   10:21 AM 03/18/2022   12:53 PM  6CIT Screen  What Year? 0 points 0 points 0 points  What month? 0 points 0 points 0 points  What time? 0 points 0 points 0 points  Count back from 20 0 points 0 points 0 points  Months in reverse 0 points 0 points 0 points  Repeat phrase 4 points 0 points 0 points  Total Score 4 points 0 points 0 points    Immunizations Immunization History  Administered Date(s) Administered   PFIZER(Purple Top)SARS-COV-2 Vaccination 09/07/2019, 10/02/2019   PNEUMOCOCCAL CONJUGATE-20 11/23/2023   Pneumococcal Polysaccharide-23 07/12/2015   Tdap 11/02/2022   Zoster Recombinant(Shingrix ) 11/02/2022, 01/26/2023    Screening Tests Health Maintenance  Topic Date Due   Hepatitis B Vaccines 19-59 Average Risk (1 of 3 - 19+ 3-dose series) Never done   Colonoscopy  06/02/2023   OPHTHALMOLOGY EXAM  10/12/2023   COVID-19 Vaccine (3 - 2025-26  season) 03/11/2024 (Originally 01/31/2024)   Influenza Vaccine  08/29/2024 (Originally 12/31/2023)   FOOT EXAM  05/04/2024   HEMOGLOBIN A1C  08/23/2024   Diabetic kidney evaluation - eGFR measurement  11/22/2024   Diabetic kidney evaluation - Urine ACR  11/22/2024   Mammogram  12/19/2024   Medicare Annual Wellness (AWV)  03/06/2025   DTaP/Tdap/Td (2 - Td or Tdap) 11/01/2032   Pneumococcal Vaccine: 50+ Years  Completed   Hepatitis C Screening  Completed   HIV Screening  Completed   Zoster Vaccines- Shingrix   Completed   HPV VACCINES  Aged Out   Meningococcal B Vaccine  Aged Out    Health Maintenance Items Addressed: Referral sent to GI for colonoscopy  Additional Screening:  Vision Screening: Recommended annual ophthalmology exams for early detection of glaucoma and other disorders of the eye. Is the patient up to date with their annual eye exam?  Yes  Who is the provider or what is the name of the office in which the patient attends annual eye exams? Looking for new provider  Dental Screening: Recommended annual dental exams for proper oral hygiene  Community Resource Referral / Chronic Care Management: CRR required this visit?  No   CCM required this visit?  No  Plan:    I have personally reviewed and noted the following in the patient's chart:   Medical and social history Use of alcohol, tobacco or illicit drugs  Current medications and supplements including opioid prescriptions. Patient is not currently taking opioid prescriptions. Functional ability and status Nutritional status Physical activity Advanced directives List of other physicians Hospitalizations, surgeries, and ER visits in previous 12 months Vitals Screenings to include cognitive, depression, and falls Referrals and appointments  In addition, I have reviewed and discussed with patient certain preventive protocols, quality metrics, and best practice recommendations. A written personalized care plan for  preventive services as well as general preventive health recommendations were provided to patient.   Ardella FORBES Dawn, LPN   [id number]   After Visit Summary: (MyChart) Due to this being a telephonic visit, the after visit summary with patients personalized plan was offered to patient via MyChart   Notes: Nothing significant to report at this time.

## 2024-03-06 NOTE — Patient Instructions (Signed)
 Ms. Sloop,  Thank you for taking the time for your Medicare Wellness Visit. I appreciate your continued commitment to your health goals. Please review the care plan we discussed, and feel free to reach out if I can assist you further.  Medicare recommends these wellness visits once per year to help you and your care team stay ahead of potential health issues. These visits are designed to focus on prevention, allowing your provider to concentrate on managing your acute and chronic conditions during your regular appointments.  Please note that Annual Wellness Visits do not include a physical exam. Some assessments may be limited, especially if the visit was conducted virtually. If needed, we may recommend a separate in-person follow-up with your provider.  Ongoing Care Seeing your primary care provider every 3 to 6 months helps us  monitor your health and provide consistent, personalized care.   Referrals If a referral was made during today's visit and you haven't received any updates within two weeks, please contact the referred provider directly to check on the status.  Recommended Screenings:  Health Maintenance  Topic Date Due   Hepatitis B Vaccine (1 of 3 - 19+ 3-dose series) Never done   Colon Cancer Screening  06/02/2023   Eye exam for diabetics  10/12/2023   COVID-19 Vaccine (3 - 2025-26 season) 03/11/2024*   Flu Shot  08/29/2024*   Complete foot exam   05/04/2024   Hemoglobin A1C  08/23/2024   Yearly kidney function blood test for diabetes  11/22/2024   Yearly kidney health urinalysis for diabetes  11/22/2024   Breast Cancer Screening  12/19/2024   Medicare Annual Wellness Visit  03/06/2025   DTaP/Tdap/Td vaccine (2 - Td or Tdap) 11/01/2032   Pneumococcal Vaccine for age over 3  Completed   Hepatitis C Screening  Completed   HIV Screening  Completed   Zoster (Shingles) Vaccine  Completed   HPV Vaccine  Aged Out   Meningitis B Vaccine  Aged Out  *Topic was postponed. The  date shown is not the original due date.       03/06/2024   10:14 AM  Advanced Directives  Does Patient Have a Medical Advance Directive? No   Advance Care Planning is important because it: Ensures you receive medical care that aligns with your values, goals, and preferences. Provides guidance to your family and loved ones, reducing the emotional burden of decision-making during critical moments.  Vision: Annual vision screenings are recommended for early detection of glaucoma, cataracts, and diabetic retinopathy. These exams can also reveal signs of chronic conditions such as diabetes and high blood pressure.  Dental: Annual dental screenings help detect early signs of oral cancer, gum disease, and other conditions linked to overall health, including heart disease and diabetes.  Please see the attached documents for additional preventive care recommendations.

## 2024-04-18 ENCOUNTER — Encounter: Payer: Self-pay | Admitting: Pharmacist

## 2024-05-16 ENCOUNTER — Other Ambulatory Visit: Payer: Self-pay

## 2024-05-16 MED ORDER — SITAGLIPTIN PHOSPHATE 25 MG PO TABS: 25.0000 mg | ORAL_TABLET | Freq: Every day | ORAL | 0 refills | Status: AC

## 2024-05-16 NOTE — Telephone Encounter (Signed)
 Copied from CRM #[id number]. Topic: Clinical - Medical Advice >> May 16, 2024 10:10 AM Nessti S wrote: Reason for CRM: pt called because she it out of town and forgot sitaGLIPtin  (JANUVIA ) 25 MG tablet at home. She would like pcp to prescribe and send to  [facility] [address] [place], [place] - [address] [place] [address] Phone: [phone] Fax: [phone].

## 2024-05-16 NOTE — Telephone Encounter (Signed)
 Patient notified VIA phone. Dm/cma

## 2024-05-30 ENCOUNTER — Ambulatory Visit: Admitting: Family Medicine

## 2024-06-06 ENCOUNTER — Encounter

## 2024-06-07 ENCOUNTER — Ambulatory Visit: Payer: Self-pay | Admitting: Family Medicine

## 2024-06-07 NOTE — Telephone Encounter (Signed)
 FYI Only or Action Required?: Action required by provider: update on patient condition and Pt with sore throat and pus on tonsils. Declines UC, in [place]  and unable to do virtual visit. Wanting to know if something can be prescribed. .  Patient was last seen in primary care on 02/24/2024 by Thedora Garnette HERO, MD.  Called Nurse Triage reporting Sore Throat.  Symptoms began several days ago.  Interventions attempted: OTC medications: mucinex and Dietary changes.  Symptoms are: gradually worsening.  Triage Disposition: See Physician Within 24 Hours  Patient/caregiver understands and will follow disposition?: No, wishes to speak with PCP   3 days ago onset of 6-7/10 sore throat pain and 7/10 pressure like pain in both ears. White spots on back throat. Feverish with chills, has not checked temp. No cough. Has some post nasal with yellow mucus. Mild wheezing. Denies respiratory hx. Denies SOB. Niece and her sister recently have had head cold. Pt currently in [place]  and unable to do a virtual visit. Declines UC. Would like to know if her PCP can prescribe something to the pharmacy below:  [facility] [address] [place], [place] - [address]    Advised rx is not guaranteed and that she may need to go to UC if med cannot be prescribed. Verbalizes understanding. Advised UC or ED for worsening symptoms.   Copied from CRM [id number]. Topic: Clinical - Red Word Triage >> Jun 07, 2024  3:37 PM Victoria A wrote: [facility] that prompted transfer to Nurse Triage: Patient is in [place]  currently sore throat, fever and chills, ears are hurting and patient see white spots on tonsils. Reason for Disposition  [1] Pus on tonsils (back of throat) AND [2] fever AND [3] swollen neck lymph nodes (glands)  Answer Assessment - Initial Assessment Questions 1. ONSET: When did the throat start hurting? (Hours or days ago)      3 days ago  2. SEVERITY: How bad is the sore throat? (Scale 1-10; mild,  moderate or severe)     6-7/10  3. STREP EXPOSURE: Has there been any exposure to strep within the past week? If Yes, ask: What type of contact occurred?      Denies  4.  VIRAL SYMPTOMS: Are there any symptoms of a cold, such as a runny nose, cough, hoarse voice or red eyes?      Hoarse voice with runny nose, eyes are burning   5. FEVER: Do you have a fever? If Yes, ask: What is your temperature, how was it measured, and when did it start?     Has not checked temp but has felt feverish/chilled  6. PUS ON THE TONSILS: Is there pus on the tonsils in the back of your throat?     Yes  7. OTHER SYMPTOMS: Do you have any other symptoms? (e.g., difficulty breathing, headache, rash)     Mild headache, feeling weak  Protocols used: Sore Throat-A-AH

## 2024-06-08 NOTE — Telephone Encounter (Signed)
Unable to leave VM.  Dm/cma

## 2024-06-27 ENCOUNTER — Telehealth: Payer: Self-pay

## 2024-06-27 NOTE — Progress Notes (Unsigned)
 Pharmacy Quality Measure Review  This patient is appearing on a report for being at risk of failing the adherence measure for diabetes medications this calendar year.   Medication: Januvia  25mg  tablet Last fill date: 05/16/2024 for 90 day supply  Patient is currently adherent. She will be due for a refill on 08/14/2024.  Lisle Slocumb Student-PharmD.

## 2024-06-29 ENCOUNTER — Telehealth: Payer: Self-pay

## 2024-06-29 ENCOUNTER — Other Ambulatory Visit: Payer: Self-pay

## 2024-06-29 DIAGNOSIS — E782 Mixed hyperlipidemia: Secondary | ICD-10-CM

## 2024-06-29 DIAGNOSIS — M5010 Cervical disc disorder with radiculopathy, unspecified cervical region: Secondary | ICD-10-CM

## 2024-06-29 DIAGNOSIS — G8929 Other chronic pain: Secondary | ICD-10-CM

## 2024-06-29 NOTE — Telephone Encounter (Signed)
 Copied from CRM [id number]. Topic: Clinical - Medication Refill >> Jun 29, 2024  4:07 PM Alfonso HERO wrote: Medication: ezetimibe  (ZETIA ) 10 MG tablet acetaminophen -codeine  (TYLENOL  #3) 300-30 MG tablet ibuprofen  (ADVIL ) 800 MG tablet Blood Glucose Monitoring Suppl DEVI   Has the patient contacted their pharmacy? No (Agent: If no, request that the patient contact the pharmacy for the refill. If patient does not wish to contact the pharmacy document the reason why and proceed with request.) (Agent: If yes, when and what did the pharmacy advise?)  This is the patient's preferred pharmacy:   [facility] [address] [place], [place] - [address] [place] [address] Phone: [phone] Fax: [phone]  Is this the correct pharmacy for this prescription? Yes If no, delete pharmacy and type the correct one.   Has the prescription been filled recently? Yes  Is the patient out of the medication? Yes  Has the patient been seen for an appointment in the last year OR does the patient have an upcoming appointment? Yes  Can we respond through MyChart? Yes  Agent: Please be advised that Rx refills may take up to 3 business days. We ask that you follow-up with your pharmacy.

## 2024-06-29 NOTE — Telephone Encounter (Signed)
 Refill request for  Tylenol  3 LR  01/11/24, #60, 1 rf  Ibuprofen  800 mg LR 01/11/24, #60, 2 rf  Zetiia 10 mg LR 01/11/24, #90, 3 rf  Acuc-check Gide test strips  LOV  02/24/24 FOV  none scheduled.    Please send to [place]  [facility].  Thanks. Dm/cma

## 2024-06-29 NOTE — Telephone Encounter (Signed)
 Refill request sent in another message to provider.    Patient notified VIA phone that it will be tomorrow before it gets addressed. Dm/cma

## 2024-06-30 ENCOUNTER — Telehealth: Payer: Self-pay

## 2024-06-30 MED ORDER — ACCU-CHEK GUIDE TEST VI STRP: 1.0000 | ORAL_STRIP | Freq: Every day | 1 refills | Status: DC | PRN

## 2024-06-30 MED ORDER — ACETAMINOPHEN-CODEINE 300-30 MG PO TABS: ORAL_TABLET | ORAL | 0 refills | Status: AC

## 2024-06-30 MED ORDER — ACCU-CHEK GUIDE TEST VI STRP: 1.0000 | ORAL_STRIP | 1 refills | Status: DC | PRN

## 2024-06-30 MED ORDER — IBUPROFEN 800 MG PO TABS: 800.0000 mg | ORAL_TABLET | Freq: Two times a day (BID) | ORAL | 0 refills | Status: AC | PRN

## 2024-06-30 MED ORDER — EZETIMIBE 10 MG PO TABS: 10.0000 mg | ORAL_TABLET | Freq: Every day | ORAL | 0 refills | Status: AC

## 2024-06-30 NOTE — Telephone Encounter (Signed)
 Rx resent for once daily. Dm/cma

## 2024-07-07 ENCOUNTER — Telehealth: Payer: Self-pay

## 2024-07-07 MED ORDER — ACCU-CHEK GUIDE TEST VI STRP: ORAL_STRIP | 1 refills | Status: AC

## 2024-07-07 NOTE — Telephone Encounter (Signed)
 Rx sent with different directions. Dm/cma

## 2025-03-12 ENCOUNTER — Ambulatory Visit
